# Patient Record
Sex: Male | Born: 1935
Health system: Southern US, Community
[De-identification: ages and names within clinical notes are randomized; demographics above are authoritative.]

## PROBLEM LIST (undated history)

## (undated) DIAGNOSIS — R7309 Other abnormal glucose: Secondary | ICD-10-CM

## (undated) DIAGNOSIS — I10 Essential (primary) hypertension: Secondary | ICD-10-CM

## (undated) DIAGNOSIS — E7800 Pure hypercholesterolemia, unspecified: Secondary | ICD-10-CM

## (undated) DIAGNOSIS — J449 Chronic obstructive pulmonary disease, unspecified: Secondary | ICD-10-CM

## (undated) DIAGNOSIS — I219 Acute myocardial infarction, unspecified: Secondary | ICD-10-CM

## (undated) DIAGNOSIS — I639 Cerebral infarction, unspecified: Secondary | ICD-10-CM

## (undated) DIAGNOSIS — J189 Pneumonia, unspecified organism: Secondary | ICD-10-CM

## (undated) DIAGNOSIS — I499 Cardiac arrhythmia, unspecified: Secondary | ICD-10-CM

## (undated) DIAGNOSIS — I252 Old myocardial infarction: Secondary | ICD-10-CM

## (undated) DIAGNOSIS — E669 Obesity, unspecified: Secondary | ICD-10-CM

## (undated) DIAGNOSIS — I2510 Atherosclerotic heart disease of native coronary artery without angina pectoris: Secondary | ICD-10-CM

## (undated) DIAGNOSIS — R0600 Dyspnea, unspecified: Secondary | ICD-10-CM

## (undated) HISTORY — DX: Obesity, unspecified: E66.9

## (undated) HISTORY — DX: Essential (primary) hypertension: I10

## (undated) HISTORY — DX: Atherosclerotic heart disease of native coronary artery without angina pectoris: I25.10

## (undated) HISTORY — DX: Old myocardial infarction: I25.2

## (undated) HISTORY — DX: Other abnormal glucose: R73.09

## (undated) HISTORY — DX: Pure hypercholesterolemia, unspecified: E78.00

---

## 1978-08-02 DIAGNOSIS — I252 Old myocardial infarction: Secondary | ICD-10-CM

## 1978-08-02 HISTORY — DX: Old myocardial infarction: I25.2

## 1991-08-03 HISTORY — PX: CARDIAC CATHETERIZATION: SHX172

## 1994-05-02 HISTORY — PX: CORONARY ARTERY BYPASS GRAFT: SHX141

## 2002-03-14 ENCOUNTER — Ambulatory Visit (HOSPITAL_COMMUNITY): Admission: RE | Admit: 2002-03-14 | Discharge: 2002-03-14 | Payer: Self-pay | Admitting: *Deleted

## 2002-03-14 ENCOUNTER — Encounter (INDEPENDENT_AMBULATORY_CARE_PROVIDER_SITE_OTHER): Payer: Self-pay | Admitting: Specialist

## 2002-08-02 HISTORY — PX: CORONARY STENT PLACEMENT: SHX1402

## 2003-04-06 ENCOUNTER — Inpatient Hospital Stay (HOSPITAL_COMMUNITY): Admission: EM | Admit: 2003-04-06 | Discharge: 2003-04-10 | Payer: Self-pay

## 2004-08-26 ENCOUNTER — Emergency Department (HOSPITAL_COMMUNITY): Admission: EM | Admit: 2004-08-26 | Discharge: 2004-08-26 | Payer: Self-pay | Admitting: Emergency Medicine

## 2009-01-03 ENCOUNTER — Encounter: Admission: RE | Admit: 2009-01-03 | Discharge: 2009-01-03 | Payer: Self-pay | Admitting: Internal Medicine

## 2010-04-20 ENCOUNTER — Ambulatory Visit: Payer: Self-pay | Admitting: Cardiology

## 2010-11-01 HISTORY — PX: CARDIAC CATHETERIZATION: SHX172

## 2010-11-13 ENCOUNTER — Emergency Department (HOSPITAL_COMMUNITY): Payer: Medicare Other

## 2010-11-13 ENCOUNTER — Inpatient Hospital Stay (HOSPITAL_COMMUNITY)
Admission: EM | Admit: 2010-11-13 | Discharge: 2010-11-14 | DRG: 287 | Disposition: A | Discharge status: Home / Self Care | Payer: Medicare Other | Attending: Cardiology | Admitting: Cardiology

## 2010-11-13 DIAGNOSIS — I25810 Atherosclerosis of coronary artery bypass graft(s) without angina pectoris: Secondary | ICD-10-CM | POA: Diagnosis present

## 2010-11-13 DIAGNOSIS — I10 Essential (primary) hypertension: Secondary | ICD-10-CM | POA: Diagnosis present

## 2010-11-13 DIAGNOSIS — I2510 Atherosclerotic heart disease of native coronary artery without angina pectoris: Principal | ICD-10-CM | POA: Diagnosis present

## 2010-11-13 DIAGNOSIS — I2582 Chronic total occlusion of coronary artery: Secondary | ICD-10-CM | POA: Diagnosis present

## 2010-11-13 DIAGNOSIS — Z79899 Other long term (current) drug therapy: Secondary | ICD-10-CM

## 2010-11-13 DIAGNOSIS — Z7982 Long term (current) use of aspirin: Secondary | ICD-10-CM

## 2010-11-13 DIAGNOSIS — R0789 Other chest pain: Secondary | ICD-10-CM

## 2010-11-13 DIAGNOSIS — E785 Hyperlipidemia, unspecified: Secondary | ICD-10-CM | POA: Diagnosis present

## 2010-11-13 DIAGNOSIS — Z87891 Personal history of nicotine dependence: Secondary | ICD-10-CM

## 2010-11-13 DIAGNOSIS — E669 Obesity, unspecified: Secondary | ICD-10-CM | POA: Diagnosis present

## 2010-11-13 DIAGNOSIS — Z9861 Coronary angioplasty status: Secondary | ICD-10-CM

## 2010-11-13 LAB — PROTIME-INR
INR: 0.97 (ref 0.00–1.49)
Prothrombin Time: 13.1 seconds (ref 11.6–15.2)

## 2010-11-13 LAB — BASIC METABOLIC PANEL
CO2: 30 mEq/L (ref 19–32)
Calcium: 9.2 mg/dL (ref 8.4–10.5)
GFR calc Af Amer: >60 mL/min (ref >60)
GFR calc non Af Amer: >60 mL/min (ref >60)
Sodium: 136 mEq/L (ref 135–145)

## 2010-11-13 LAB — CBC
MCHC: 35.7 g/dL (ref 30.0–36.0)
RDW: 12.7 % (ref 11.5–15.5)

## 2010-11-13 LAB — DIFFERENTIAL
Basophils Absolute: 0.1 10*3/uL (ref 0.0–0.1)
Basophils Relative: 1 % (ref 0–1)
Eosinophils Absolute: 0.1 10*3/uL (ref 0.0–0.7)
Eosinophils Relative: 1 % (ref 0–5)
Monocytes Absolute: 0.3 10*3/uL (ref 0.1–1.0)

## 2010-11-13 LAB — CK TOTAL AND CKMB (NOT AT ARMC)
CK, MB: 1.9 ng/mL (ref 0.3–4.0)
Total CK: 65 U/L (ref 7–232)

## 2010-11-14 DIAGNOSIS — R079 Chest pain, unspecified: Secondary | ICD-10-CM

## 2010-11-14 LAB — LIPID PANEL
Cholesterol: 112 mg/dL (ref 0–200)
HDL: 40 mg/dL (ref >39)
Total CHOL/HDL Ratio: 2.8 RATIO
Triglycerides: 108 mg/dL (ref <150)

## 2010-11-14 LAB — CBC
HCT: 40.6 % (ref 39.0–52.0)
MCV: 90.2 fL (ref 78.0–100.0)
Platelets: 209 10*3/uL (ref 150–400)
RBC: 4.5 MIL/uL (ref 4.22–5.81)
WBC: 6 10*3/uL (ref 4.0–10.5)

## 2010-11-14 LAB — BASIC METABOLIC PANEL
BUN: 11 mg/dL (ref 6–23)
Chloride: 107 mEq/L (ref 96–112)
Glucose, Bld: 114 mg/dL — ABNORMAL HIGH (ref 70–99)
Potassium: 3.6 mEq/L (ref 3.5–5.1)
Sodium: 140 mEq/L (ref 135–145)

## 2010-11-14 LAB — CARDIAC PANEL(CRET KIN+CKTOT+MB+TROPI): Relative Index: INVALID (ref 0.0–2.5)

## 2010-11-19 ENCOUNTER — Encounter: Payer: Self-pay | Admitting: *Deleted

## 2010-11-19 DIAGNOSIS — I259 Chronic ischemic heart disease, unspecified: Secondary | ICD-10-CM

## 2010-11-19 DIAGNOSIS — E7800 Pure hypercholesterolemia, unspecified: Secondary | ICD-10-CM | POA: Insufficient documentation

## 2010-11-19 DIAGNOSIS — I2510 Atherosclerotic heart disease of native coronary artery without angina pectoris: Secondary | ICD-10-CM | POA: Insufficient documentation

## 2010-11-19 DIAGNOSIS — I10 Essential (primary) hypertension: Secondary | ICD-10-CM | POA: Insufficient documentation

## 2010-11-19 DIAGNOSIS — E669 Obesity, unspecified: Secondary | ICD-10-CM

## 2010-11-19 DIAGNOSIS — R7309 Other abnormal glucose: Secondary | ICD-10-CM | POA: Insufficient documentation

## 2010-11-19 NOTE — Discharge Summary (Signed)
  NAME:  BLANCHE, GALLIEN NO.:  [id number]  MEDICAL RECORD NO.:  [id number]           PATIENT TYPE:  I  LOCATION:  3709                         FACILITY:  [facility]  PHYSICIAN:  Noralyn Pick. Eden Emms, MD, FACCDATE OF BIRTH:  April 03, 1936  DATE OF ADMISSION:  11/13/2010 DATE OF DISCHARGE:  11/14/2010                              DISCHARGE SUMMARY   PROCEDURES: 1. Cardiac catheterization. 2. Coronary arteriogram. 3. Left ventriculogram. 4. Left internal mammary artery arteriogram. 5. Graft angiogram.  PRIMARY FINAL DISCHARGE DIAGNOSIS:  Chest pain, medical therapy for coronary artery disease recommended.  SECONDARY DIAGNOSES: 1. Status post aortocoronary bypass surgery in 1995 with left internal     mammary artery to left anterior descending artery, saphenous vein     graft to circumflex, saphenous vein graft to right coronary artery. 2. Status post cardiac catheterization last in 2004 with a 2.5 x 13     Cypher stent to the left main, saphenous vein graft to right     coronary artery totalled. 3. Hypertension. 4. Dyslipidemia. 5. Obesity. 6. Status post cholecystectomy. 7. History of hyperglycemia. 8. History of tobacco use, quit in 1995.  LABORATORY VALUES:  Fasting blood sugar 114, nonfasting blood sugar 164. Total cholesterol 112, triglycerides 108, HDL 40, LDL 50.  Chest x-ray; mild hyperinflation, no acute disease.  HOSPITAL COURSE:  Mr. Larry Briggs is a 75 year old male with known coronary artery disease.  He had chest pain that reminded him of his prebypass symptoms and came to the hospital where he was admitted for further evaluation.  His cardiac enzymes were negative for MI.  Cardiac catheterization was performed on November 13, 2010 and showed patent left main stent with less than 40% in-stent restenosis, LIMA to LAD patent and SVG to OM patent with no critical distal disease, SVG to RCA and RCA both totalled, EF 50% with inferior hypokinesis.  On November 14, 2010, Mr. Riddle was ambulating without chest pain or shortness of breath.  A lipid profile and chest x-ray were described above.  His cath site was without bruit or hematoma.  He is considered stable for discharge, to follow up as an outpatient.  DISCHARGE INSTRUCTIONS: 1. His activity level is to be increased gradually. 2. He is to call our office for problems with the cath site. 3. He is encouraged to stick to a low-sodium, low-carbohydrate diet     and follow up with primary care. 4. He is to follow up with Dr. Deborah Chalk and our office will call.  DISCHARGE MEDICATIONS: 1. Doxazosin 4 mg a day. 2. Lisinopril 5 mg a day. 3. Fish oil 1000 mg daily. 4. Toprol-XL 50 mg a day. 5. Simvastatin 20 mg a day. 6. Multivitamin daily. 7. Sublingual nitroglycerin p.r.n. 8. Aspirin 325 mg a day.     Theodore Demark, PA-C   ______________________________ Noralyn Pick. Eden Emms, MD, [facility]    RB/MEDQ  D:  11/14/2010  T:  11/14/2010  Job:  [id number]  cc:   [facility]  Electronically Signed by Theodore Demark PA-C on 11/17/2010 01:32:48 PM Electronically Signed by Charlton Haws MD [facility] on 11/19/2010 11:31:36 AM

## 2010-11-20 ENCOUNTER — Ambulatory Visit (INDEPENDENT_AMBULATORY_CARE_PROVIDER_SITE_OTHER): Payer: Medicare Other | Admitting: Cardiology

## 2010-11-20 ENCOUNTER — Encounter: Payer: Self-pay | Admitting: Cardiology

## 2010-11-20 VITALS — Ht 70.0 in | Wt 200.0 lb

## 2010-11-20 DIAGNOSIS — I2510 Atherosclerotic heart disease of native coronary artery without angina pectoris: Secondary | ICD-10-CM

## 2010-11-20 NOTE — Progress Notes (Signed)
Subjective:   Larry Briggs comes in today for followup visit. We discussed the fact that I did not original catheterization on him in 1983. He a recent catheterization the hospital which confirmed one occluded distal right coronary artery that filled by natural collaterals and otherwise he has satisfactory perfusion. His ejection fraction was well preserved. I encouraged him to continue his exercise and modification of cardiovascular risk factors. He does have mild obesity as well as hypertension and dyslipidemia. Overall, he's feeling better without recurrent chest discomfort. His groin is satisfactory from his catheterization.  Current Outpatient Prescriptions  Medication Sig Dispense Refill  . Ascorbic Acid (VITAMIN C) 500 MG CAPS Take 1 capsule by mouth daily. ( Estra-C )       . aspirin 325 MG EC tablet Take 325 mg by mouth daily.        Marland Kitchen doxazosin (CARDURA) 4 MG tablet Take 4 mg by mouth at bedtime.        . fish oil-omega-3 fatty acids 1000 MG capsule Take 1 g by mouth 2 (two) times daily.        Marland Kitchen lisinopril (PRINIVIL,ZESTRIL) 5 MG tablet Take 5 mg by mouth daily.        . metoprolol (TOPROL-XL) 50 MG 24 hr tablet Take 50 mg by mouth daily.        . multivitamin (THERAGRAN) per tablet Take 1 tablet by mouth daily.        . simvastatin (ZOCOR) 20 MG tablet Take 20 mg by mouth at bedtime.        Marland Kitchen NITROSTAT 0.4 MG SL tablet Take 1 tablet by mouth as needed.        Allergies  Allergen Reactions  . Iodinated Diagnostic Agents     Patient Active Problem List  Diagnoses  . Hypertension  . Arteriosclerotic cardiovascular disease (ASCVD)  . Hypercholesteremia  . Obesity  . Ischemic heart disease  . Increased glucose level    History  Smoking status  . Former Smoker -- 1.0 packs/day for 40 years  . Types: Cigarettes  . Quit date: 07/02/1994  Smokeless tobacco  . Not on file    History  Alcohol Use No    Family History  Problem Relation Age of Onset  . Heart attack Father   .  Hypertension Father   . Stroke Mother     Review of Systems:   The patient denies any heat or cold intolerance.  No weight gain or weight loss.  The patient denies headaches or blurry vision.  There is no cough or sputum production.  The patient denies dizziness.  There is no hematuria or hematochezia.  The patient denies any muscle aches or arthritis.  The patient denies any rash.  The patient denies frequent falling or instability.  There is no history of depression or anxiety.  All other systems were reviewed and are negative.   Physical Exam:   Weight is 200. Blood pressure 126/84 sitting, heart rate 62. He has moderate truncal obesity.The head is normocephalic and atraumatic.  Pupils are equally round and reactive to light.  Sclerae nonicteric.  Conjunctiva is clear.  Oropharynx is unremarkable.  There's adequate oral airway.  Neck is supple there are no masses.  Thyroid is not enlarged.  There is no lymphadenopathy.  Lungs are clear.  Chest is symmetric.  Heart shows a regular rate and rhythm.  S1 and S2 are normal.  There is no murmur click or gallop.  Abdomen is soft normal bowel sounds.  There is no organomegaly.  Genital and rectal deferred.  Extremities are without edema.  Peripheral pulses are adequate.  Neurologically intact.  Full range of motion.  The patient is not depressed.  Skin is warm and dry. Assessment / Plan:

## 2010-11-20 NOTE — Assessment & Plan Note (Signed)
We will continue his current medical management. His catheterization was done on November 13, 2010 which showed only mild reduction in left ventricular function with an inferior basilar wall motion abnormality in a global ejection fraction of 50%. He had a widely patent internal mammary artery to the diagonal LAD with collateralization of the distal right coronary artery. He had a patent saphenous vein graft to the obtuse marginal. He had an occluded saphenous vein graft to the distal right coronary artery with retrograde collaterals is noted. He had continued patency of the stent to the left main coronary artery and LAD. Overall, he continues to do well. He would like to see Dr. [place] in followup and I'll have him seen in approximately 6-8 months.

## 2010-11-20 NOTE — H&P (Signed)
NAME:  Larry Briggs, Larry Briggs NO.:  [id number]  MEDICAL RECORD NO.:  [id number]           PATIENT TYPE:  I  LOCATION:  3709                         FACILITY:  [facility]  PHYSICIAN:  Luis Abed, MD, FACCDATE OF BIRTH:  [birth year]  DATE OF ADMISSION:  11/13/2010 DATE OF DISCHARGE:                             HISTORY & PHYSICAL   PRIMARY CARDIOLOGIST:  Colleen Can. Deborah Chalk, MD  CHIEF COMPLAINT:  Chest discomfort.  HISTORY OF PRESENT ILLNESS:  Larry Briggs is a 75 year old Caucasian gentleman with a known history of coronary artery disease, having had CABG in 1995 with last cath in 2004 revealing 90% left main disease resulting in a 2.5 x 13-mm Cypher stent being placed as well as the discovery of 100% occlusion in the RCA and 100% occlusion of SVG to RCA but with collaterals from LAD, which had a patent LIMA to that vessel and patent SVG to the circumflex with preserved LV systolic function.The patient also has history significant for hypertension, dyslipidemia, obesity and S/P cholecystectomy who now presents with chest discomfort/chest flushing in the setting of generally feeling unwell, first noted yesterday and again today.  The patient was in his usual state of health until yesterday when the patient noticed chest discomfort which he has had from time to time since his cath in 2004.  This is nonexertional and he paid no attention to it.  Upon detailed inquiry, the patient describes a very localized area in the left chest where he gets some slight discomfort which is alleviated by palpation, but has no clear aggravating factors and can last for several hours at a low level of intensity.  The patient was not especially concerned about these symptoms, but this morning he awoke early, and shortly thereafter had a sensation of flushing in his chest. This was alarming to the patient because he has had this same symptom prior to his CABG and cath in 2004.  He was also  generally not feeling well and when he describes his clinical picture to his wife, she insisted they come to the emergency department for further evaluation. The patient also notes that there were planning a trip and he did not feel comfortable leaving town without being evaluated first.  The patient has had one set of negative cardiac enzymes and his EKG shows no concerning changes.  Vital signs only significant for mild hypertension with peak systolic at 162.  Chest x-ray benign.  CBC and BMET unremarkable except for glucose of 164.  Upon further interrogation, the patient describes approximately 1 minute of chest warmness that is virtually identical to symptoms he was experiencing prior to his CABG and cath in 1004.  However, today he only had one episode and in the past, he would have multiple episodes but again lasting only approximately 1 minute.  The patient continues to feel generally unwell, but not in a serious distress and has had no recurrence of his chest discomfort described above since this morning.  The patient is unsure but thinks he had a stress test that was negative in 2010 (pharmacologic).  Note from September 2011 by Dr. Deborah Chalk, does not  mention any stress test except for a stress Cardiolite completed in March 2000.  PAST MEDICAL HISTORY: 1. CAD.     a.     CABG, 1995:  SVG - RCA, SVG - circumflex artery, LIMA to      LAD.     b.     Cardiac catheterization 2004 revealing severe LM disease,      90% (S/P 2.5 x 13 mm Cypher DES), 100% occlusion of RCA, 100%      occlusion of SVG - RCA with collaterals from LAD, LIMA - LAD      patent, SVG to circumflex patent, preserved LV systolic function. 2. Hypertension. 3. Dyslipidemia. 4. Obesity. 5. S/P cholecystectomy.  SOCIAL HISTORY:  The patient lives in [place] with his wife.  He is retired but is very active working around his house and does still exercise on a treadmill occasionally without exertional  symptoms.  He follows a regular diet only.  No tobacco abuse for decades, no significant EtOH, no illicit drug use.  FAMILY HISTORY:  Noncontributory secondary to the patient's advanced age.  REVIEW OF SYSTEMS:  Please see HPI.  All other systems were reviewed and were negative.  CODE STATUS:  Full.  ALLERGIES:  NKDA.  MEDICATIONS: 1. Lisinopril 5 mg p.o. daily. 2. Doxazosin 4 mg p.o. daily. 3. Toprol 50 mg p.o. daily. 4. Simvastatin 20 mg p.o. nightly. 5. Enteric-coated aspirin 325 mg p.o. daily.  PHYSICAL EXAMINATION:  VITAL SIGNS:  Temperature 98.0 degrees Fahrenheit, BP 138-160/58-79, pulse 60-74 with respirations of 12-16, O2 saturation 97% on room air. GENERAL:  The patient is alert and oriented x3, in no apparent distress, able to speak easily in full sentences without respiratory distress. HEAD:  Normocephalic, atraumatic.  Pupils equal, round, and reactive to light.  Extraocular muscles are intact.  Nares are patent without discharge.  Oropharynx without erythema or exudates. NECK:  Supple without lymphadenopathy.  No thyromegaly.  No JVD. HEART:  Heart rate is regular with audible S1, S2.  No clicks, rubs, murmurs or gallops and pulses are 2+ and equal in both upper and lower extremities bilaterally. LUNGS:  Clear to auscultation bilaterally. SKIN:  No rashes, lesions, or petechiae. ABDOMEN:  Soft, nontender, nondistended.  Normal abdominal bowel sounds. No rebound or guarding.  No hepatosplenomegaly.  The patient is obese. EXTREMITIES:  Without clubbing, cyanosis, or edema. MUSCULOSKELETAL:  Without joint deformity, effusions or spinal nor CVA tenderness. NEUROLOGIC;  Cranial nerves II through XII grossly intact.  Strength 5/5 in all extremities and axial groups. Normal sensation throughout and normal cerebellar function.  RADIOLOGY:  Chest x-ray showed mild hyperinflation with no active disease.  EKG:  Rhythm, NSR, 75 bpm, nonspecific ST-T wave changes,  Q-wave in V1, normal axis, no evidence of hypertrophy.  PR 180, QRS 90, and QTc of 438.  LABORATORY DATA:  WBC is 6.3, HGB 16.4, HCT 45.9, PLT count is 202. Sodium 136, potassium 4.6, chloride 100, bicarb 30, BUN 11, creatinine 0.95, glucose 164.  First full set of enzymes negative with CK 65, MB 1.9, and troponin 0.01.  ASSESSMENT AND PLAN:  The patient initially was seen by Jarrett Ables, PA-C and then seen and thoroughly assessed by attending cardiologist, Dr. Willa Rough.  Mr. Satz is a 75 year old Caucasian gentleman with the above-noted complex medical history including coronary artery disease with prior coronary artery bypass graft and last cath in 2004 resulting in Cypher stent to 90% left main disease as well as finding of 100% right  coronary artery and vein graft to the right coronary artery occlusion with collaterals from left anterior descending with patent left internal mammary artery to that vessel and patent saphenous vein graft to circumflex artery, otherwise history significant for hypertension, dyslipidemia, obesity who now presents with his anginal equivalent without any objective evidence of a cardiac etiology to his symptoms.  Given the patient's high pretest probability and the similarity in his symptoms today to symptoms he has had prior to his other events, would favor diagnostic cardiac catheterization for definitive answer.  Risks and benefits of cardiac catheterization have been discussed in detail with the patient and his wife, and they agree to proceed with cath.  We will make every attempt to have this procedure completed today as today is Friday and we will be unable to do elective catheterizations over the weekend.  Otherwise, we will continue home medications and further planning, pending results of diagnostic cath.     Jarrett Ables, PAC   ______________________________ Luis Abed, MD, [facility]    MS/MEDQ  D:  11/13/2010  T:   11/14/2010  Job:  [id number]  Electronically Signed by Jarrett Ables PAC on 11/16/2010 04:07:06 PM Electronically Signed by Willa Rough MD F[facility] on 11/20/2010 01:07:16 PM

## 2010-12-18 NOTE — Discharge Summary (Signed)
NAME:  Larry Briggs, Larry Briggs NO.:  [id number]   MEDICAL RECORD NO.:  [id number]                   PATIENT TYPE:  INP   LOCATION:  6527                                 FACILITY:  [facility]   PHYSICIAN:  Colleen Can. Deborah Chalk, M.D.            DATE OF BIRTH:  [birth year]   DATE OF ADMISSION:  04/06/2003  DATE OF DISCHARGE:  04/10/2003                                 DISCHARGE SUMMARY   PRIMARY DISCHARGE DIAGNOSIS:  Chest pain with subsequent cardiac  catheterization with elective percutaneous coronary intervention to the left  main coronary artery.   SECONDARY DIAGNOSES:  1. Known atherosclerotic cardiovascular disease with previous coronary     artery bypass grafting dating back to 1995.  2. Hyperlipidemia.  3. Hypertension.  4. History of hyperglycemia.   HISTORY OF PRESENT ILLNESS:  The patient is a 75 year old white male who  presents to the hospital with a two to three day history of chest  discomfort. He thought it was initially attributed to pulling fence post,  and his pain was initially relieved with Tylenol; however, on the day of  admission, he had associated nausea with increased intensity of the  discomfort. It lasted for approximately 20 minutes. This discomfort was not  like his previous chest pain syndrome. He had no other associated symptoms.  He was subsequently seen and evaluated by Dr. Candace Cruise and was referred  for cardiac catheterization.   Please see the dictated history and physical for further patient  presentation and profile.   LABORATORY DATA:  On admission, cardiac enzymes were negative. Hemoglobin  A1c was 5.4. CBC was basically within normal limits.   EKG showed T wave changes.   HOSPITAL COURSE:  The patient was admitted initially by Dr. Candace Cruise. He  ruled out negative for myocardial infarction. He was managed medically over  the course of the weekend with no further episodes of chest pain. We  proceeded on with  cardiac catheterization on April 09, 2003. That  procedure was tolerated well without any noted complications. The vein graft  to the right coronary artery/PDA was 100% occluded; right coronary artery  was 100% occluded proximally. The left main has a 90% distal narrowing. The  LAD has diffuse 90% narrowing with very satisfactory left internal mammary  to the LAD with collaterals to the right coronary. The left circumflex is  100% occluded with the vein graft being satisfactory. Subsequent angioplasty  with stent placement with a 2.5 x 13 mm Cypher stent was placed to the left  main with an overall satisfactory result obtained.   By April 10, 2003, he was doing well without complaints. Overall,  physical exam was unremarkable, and he was felt to be a stable candidate for  discharge. His potassium was slightly low on the day of discharge and was  replaced appropriately.   DISCHARGE CONDITION:  Stable.   DISCHARGE MEDICATIONS:  He will resume his  aspirin, Cardura, and Lipitor as  he was taking before. We will add Plavix 75 mg a day for the next six  months, Toprol-XL 50 mg a day, and nitroglycerin p.r.n.   ACTIVITY:  His activity is to be light for one week.   DIET:  No concentrated sweets.   FOLLOW UP:  He is to place an ice pack if needed to the ground, and  otherwise, we will plan on seeing him back in approximately two weeks. He is  to call the office to schedule that appointment.      Juanell Fairly C. Earl Gala, N.P.                 Colleen Can. Deborah Chalk, M.D.    LCO/MEDQ  D:  04/10/2003  T:  04/10/2003  Job:  [id number]   cc:   [facility]   Elvina Sidle, M.D.  [address] [place]  [place] [address]  Fax: [phone]

## 2010-12-18 NOTE — Cardiovascular Report (Signed)
NAME:  Larry Briggs, Larry Briggs NO.:  [id number]   MEDICAL RECORD NO.:  [id number]                   PATIENT TYPE:  INP   LOCATION:  6527                                 FACILITY:  [facility]   PHYSICIAN:  Colleen Can. Deborah Chalk, M.D.            DATE OF BIRTH:  07-Apr-1936   DATE OF PROCEDURE:  04/09/2003  DATE OF DISCHARGE:                              CARDIAC CATHETERIZATION   PROCEDURE:  Left heart catheterization with selective coronary angiography,  left ventricular angiography with saphenous vein graft angiography x2,  angiography left internal mammary artery, and stent placement in the distal  left main coronary artery.   TYPE AND SITE OF ENTRY:  Percutaneous, right femoral artery.   CATHETERS:  6-French 4 curved Judkins right and left coronary catheters; 6-  French pigtail ventriculographic catheter, initially a 6-French guide  subsequently changed to a 7-French guide, a 2.5 x 15 mm CrossSail balloon  and subsequently a 2.5 x 13 mm Cypher stent.   CONTRAST MATERIAL:  Omnipaque.   MEDICATIONS GIVEN PRIOR TO THE PROCEDURE:  Valium 10 mg p.o.   MEDICATIONS GIVEN DURING THE PROCEDURE:  Versed 4 mg IV.   COMMENTS:  The patient tolerated the procedure well.  He was given  Integrilin, IV nitroglycerin, and heparin with his angioplasty and stent  placement.   HEMODYNAMIC DATA:  1. The aortic pressure was 133/69.  2. LV was 161/9-14.  3. There was no aortic valve gradient noted on pullback.   ANGIOGRAPHIC DATA:  1. Left main coronary artery had a 90% distal narrowing.  2. Left circumflex was totally occluded.  3. Left anterior descending had three large proximal septal perforating     branches.  There was a 90% segmental stenosis and then the diagonal     vessel as well as the continuation left anterior descending were supplied     by the left internal mammary graft.  4. Right coronary artery is totally occluded proximally.  5. Saphenous vein graft to the  right coronary artery is widely patent with a     nice insertion and good distal runoff.  6. Saphenous vein graft to the right coronary artery is totally occluded.  7. Left internal mammary artery to the LAD and diagonal are widely patent.     There is continuation with excellent flow into the posterior descending     vessel.  The posterior descending vessel has somewhat diffuse disease but     there is excellent flow.   LEFT VENTRICULAR ANGIOGRAM:  Left ventricular angiogram was performed in the  RAO position.  Overall cardiac size and silhouette are normal.  There is  very mild inferior basilar hypokinesis.  There is no intracavitary filling  defects or intracardiac calcification.   ANGIOPLASTY PROCEDURE:  A JL4 guide provided only fair back-up, especially  with the 6-French system which subsequently changed to a 7-French system.  2.5 x 15 mm CrossSail balloon was  inflated to a maximum of 18 atmospheres.  There was localized dissection returned with the 2.5 x 13 mm Cypher stent.  This was inflated to a maximum of 16 atmospheres.  Final angiographic result  was felt to be satisfactory with good distal flow and flow into the septal  perforating branches.  There was a reasonably smooth transition from the  left main coronary artery to the left anterior descending and we did have  persistent patency of the septal perforating branches as well as the  proximal circumflex branches.   OVERALL IMPRESSION:  1. Well preserved global left ventricular function with mild inferior     hypokinesis.  2. Severe left main stenosis with successful stent placement on this     catheterization laboratory visit.  3. Totally occluded right coronary artery and saphenous vein graft to the     right coronary artery with collaterals from the bypassed left anterior     descending.  4. Persistent patency of the bypass graft to the left circumflex as well as     persistent patency of the left internal mammary  artery graft to the     diagonal and left anterior descending.                                               Colleen Can. Deborah Chalk, M.D.    SNT/MEDQ  D:  04/09/2003  T:  04/10/2003  Job:  [id number]

## 2010-12-18 NOTE — H&P (Signed)
NAME:  Larry Briggs, Larry Briggs NO.:  [id number]   MEDICAL RECORD NO.:  [id number]                   PATIENT TYPE:  EMS   LOCATION:  [facility]                                 FACILITY:  [facility]   PHYSICIAN:  Meade Maw, M.D.                 DATE OF BIRTH:  19-Feb-1936   DATE OF ADMISSION:  04/06/2003  DATE OF DISCHARGE:                                HISTORY & PHYSICAL   INDICATION FOR ADMISSION:  Unstable angina.   HISTORY OF PRESENT ILLNESS:  Larry Briggs is a pleasant 75 year old  gentleman with known coronary artery disease status post CABG in 1995. These  records are not available for review, who presents to the emergency room  with complaints of discomfort in his chest for approximately 2-3 days. He  initially attributed this to pulling fence posts and felt the pain was  relieved with Tylenol. However, today he felt nauseated with increased  intensity of the discomfort. He therefore subsequently presented to the  emergency room. The discomfort did not interfere with his activity and  persisted for approximately 20 minutes. His anginal presentation prior to  his CABG was hot flashes/pressure. This discomfort is not like his previous  presenting symptoms. He denies palpitations, pre-syncope or syncope. His  last evaluation by Dr. Deborah Chalk was years ago.   PAST MEDICAL HISTORY:  1. Coronary artery disease.  2. Dyslipidemia.  3. Hypertension.  4. Hyperglycemia.   ALLERGIES:  No known drug allergies.   CURRENT MEDICATIONS:  1. Aspirin 81 mg daily.  2. Lipitor 20 mg daily.  3. Cardura, dose is unknown.   PAST SURGICAL HISTORY:  1. Coronary artery bypass surgery in 1995.  2. Cholecystectomy in 1982.   SOCIAL HISTORY:  No history of tobacco use since 1995. No history of alcohol  use. He is married. He is retired from the [employer].   REVIEW OF SYSTEMS:  His activity is limited by dyspnea and he has had  significant weight gain, resulting  in glucose intolerance. He has had no  change in bowel or bladder habits. He has had no diaphoresis. Review of  systems is otherwise negative.   FAMILY HISTORY:  Both parents are dead. Died of natural causes.   PHYSICAL EXAMINATION:  VITAL SIGNS: Blood pressure 166/80. Heart rate 98.  Respiratory rate 22. O2 saturation is 98% on room air.  HEENT: Unremarkable. He has good carotid upstrokes and no carotid bruit.  Thyroid is not palpable.  PULMONARY: Examination reveals breath sounds which are equal and clear to  auscultation. No use of accessory muscles.  CARDIOVASCULAR: Examination reveals a regular rate and rhythm. Normal S1 and  normal S2. No rubs, murmurs, or gallops noted.  ABDOMEN: Obese, nontender. No unusual bruits or pulsations. Examination is  made difficult with body habitus.  EXTREMITIES: Reveal distal pulses which are equal and palpable. There is no  peripheral edema noted.   LABORATORY  DATA:  His troponin I is less than 0.05. His myoglobin is  negative. His MB fraction is negative. His potassium is 4.3. His BUN is 11.  His glucose is 157. He has normal liver enzymes.   ECG reveals a sinus tachycardia with non-specific ST changes.   Chest x-ray is normal.   IMPRESSION:  1. Chest pain in a 75 year old gentleman with known coronary artery disease.     The chest pressure has typical atypical features. Therefore, we will     proceed with an Adenosine Cardiolite for further evaluation if his     cardiac enzymes remain negative. Will continue with nitrates, aspirin,     and Lovenox.  2. Hypertension, poorly controlled. Will add Toprol 50 mg daily.  3. Obesity, deconditioned. Cardiac rehabilitation was discussed.  4. Hyperglycemia. Will check his hemoglobin A1C.                                                Meade Maw, M.D.    HP/MEDQ  D:  04/06/2003  T:  04/06/2003  Job:  [id number]   cc:   Colleen Can. Deborah Chalk, M.D.  Fax: [phone]   [facility]

## 2010-12-31 NOTE — Cardiovascular Report (Signed)
NAME:  Larry Briggs, Larry Briggs NO.:  [id number]  MEDICAL RECORD NO.:  [id number]           PATIENT TYPE:  I  LOCATION:  3709                         FACILITY:  [facility]  PHYSICIAN:  Larry Morton. 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  He underwent stenting of the left main in 2004. At that time, he was noted to have occlusion of the saphenous vein graft to the distal right coronary artery, with collaterals from the bypass to the LAD and a patent bypass to the left circumflex.  He also has hyperlipidemia, obesity, hypertension, remote tobacco abuse, and glucose intolerance.  He has had prior cholecystectomy.  He had some episodes of flushing and was seen by Larry Briggs in the emergency room.  There was concern about return of his symptoms.  His hemoglobin was 16.4, hematocrit 45.9.  White count was normal.  Prothrombin time was intact. Potassium was 4.6, glucose 164, and BUN and creatinine normal. Creatinine clearance was in excess of 60.  His initial set of enzymes were negative.  Larry Briggs felt that cardiac catheterization was indicated.  He was brought for further evaluation.  PROCEDURE: 1. Left heart catheterization. 2. Selective coronary arteriography. 3. Selective left ventriculography. 4. Saphenous vein graft angiography. 5. Selective left internal mammary angiography.  DESCRIPTION OF PROCEDURE:  The procedure was performed from the right femoral artery using 5-French catheters.  There were no complications. He tolerated the procedure well.  He was taken to the holding area in satisfactory clinical condition.  I reviewed the films, compared them to the old films, discussed the case with the patient and subsequently his wife and reviewed the films with his wife as well.   There were no major complications.  HEMODYNAMIC DATA: 1. The central aortic pressure was 161/76, mean 111. 2. Left ventricular pressure 170/11. 3. There was no gradient or pullback across aortic valve.  ANGIOGRAPHIC DATA: 1. The left main actually is patent.  The circumflex ostium is not     seen.  The stent previously placed at the left main LAD junction     overlapping the takeoff of the diagonal is patent.  There is flow     into the distal LAD through the septal perforators.  There is     excellent flow noted retrograde and antegrade through the left     internal mammary to diagonal and LAD.  Retrograde filling of this     graft is noted. 2. The internal mammary to the diagonal and LAD is widely patent with     excellent flow to the distal vessels.  Collateralization of the     distal right coronary circulation is seen. 3. The circumflex is noted as occluded. 4. This right coronary artery is noted as occluded. 5. The saphenous vein graft to the large obtuse marginal appears     intact and widely patent with smooth margins. 6. The saphenous vein graft to the distal right circulation is  occluded and the distal right circulation is supplied as noted. 7. The ventriculogram demonstrates ejection fraction in the range of     50% with an inferobasal wall motion abnormality.  This is largely     unchanged.  CONCLUSION: 1. Mild reduction in left ventricular function with an inferobasal     wall motion abnormality. 2. Widely patent internal mammary to the diagonal LAD with     collateralization of the distal right. 3. Patent saphenous vein graft to the obtuse marginal. 4. Occluded saphenous vein graft to the distal right with retrograde     collaterals as noted. 5. Continued patency of the stent to the LAD left main interface     previously done by Larry Briggs in 2004.  DISPOSITION: 1. From the cardiac standpoint, he will be treated medically. 2. Flushing episodes are  unclear.  We will obtain a couple of studies     over the night to make sure that he has not bumped his enzymes.     Early follow up with Larry Briggs will be encouraged.  Better     control of blood pressure may also be important.     Larry Morton. Riley Kill, MD, Larry Briggs     TDS/MEDQ  D:  11/13/2010  T:  11/14/2010  Job:  [id number]  cc:   Larry Abed, MD, Larry Briggs Larry Briggs, M.D. [facility]  Electronically Signed by Larry Pons MD [facility] on 12/31/2010 09:12:27 AM

## 2011-05-10 ENCOUNTER — Encounter: Payer: Self-pay | Admitting: Nurse Practitioner

## 2011-05-10 ENCOUNTER — Ambulatory Visit (INDEPENDENT_AMBULATORY_CARE_PROVIDER_SITE_OTHER): Payer: Medicare Other | Admitting: Nurse Practitioner

## 2011-05-10 DIAGNOSIS — I2510 Atherosclerotic heart disease of native coronary artery without angina pectoris: Secondary | ICD-10-CM

## 2011-05-10 DIAGNOSIS — I10 Essential (primary) hypertension: Secondary | ICD-10-CM

## 2011-05-10 DIAGNOSIS — R9431 Abnormal electrocardiogram [ECG] [EKG]: Secondary | ICD-10-CM

## 2011-05-10 MED ORDER — LISINOPRIL 5 MG PO TABS: 10.0000 mg | ORAL_TABLET | Freq: Every day | ORAL | Status: DC

## 2011-05-10 NOTE — Patient Instructions (Signed)
We are going to increase your Lisinopril to 10 mg daily. Take two of your 5 mg tabs to equal this dose.  I will get your records to review. Keep a check of your blood pressure at home. I will see you in 3 weeks.  Stay away from the caffeine.

## 2011-05-10 NOTE — Assessment & Plan Note (Signed)
Blood pressure is up at home. I have increased his Lisinopril to 10 mg daily. I will see him again in 3 weeks. Check BMET on return visit. He is to continue to monitor his blood pressure at home. Patient is agreeable to this plan and will call if any problems develop in the interim.

## 2011-05-10 NOTE — Assessment & Plan Note (Addendum)
Sounds like he had some skips. Probably due to his increase in caffeine. I explained that his beta blocker prevents his heart rate in going up. We will see what his records show. He is currently asymptomatic from a cardiac standpoint.    Records received after the visit. Concern was for a junctional rhythm on EKG. Tracing is reviewed with Dr. Swaziland. Felt to be sinus bradycardia. He remains asymptomatic. We will continue with our plan as designated.

## 2011-05-10 NOTE — Assessment & Plan Note (Signed)
He has had repeat cath back in April. We will continue with his current management. He is asymptomatic.

## 2011-05-10 NOTE — Progress Notes (Signed)
[name] Date of Birth: May 17, 1936   History of Present Illness: Mr. Kelnhofer is seen today for follow up. He is seen for Dr. Swaziland. He is a former patient of Dr. Ronnald Nian. He is here at the request of Dr. Renne Crigler. He has been feeling well. He did have recent cath earlier this year. Stent was patent. He has one known occluded SVG. He has done well without chest pain. He continues to exercise. He notes that he was using more caffeine to get his heart rate up. He did have his physical last Thursday and apparently had some abnormality on his EKG. Sounds like he was having some skips. He has since stopped the caffeine. He does note that his blood pressure is up at home. He is exercising regularly. No syncope, dizziness or lightheadedness.   Current Outpatient Prescriptions on File Prior to Visit  Medication Sig Dispense Refill  . Ascorbic Acid (VITAMIN C) 500 MG CAPS Take 1 capsule by mouth daily. ( Estra-C )       . aspirin 325 MG EC tablet Take 325 mg by mouth daily.        Marland Kitchen doxazosin (CARDURA) 4 MG tablet Take 4 mg by mouth at bedtime.        . fish oil-omega-3 fatty acids 1000 MG capsule Take 1 g by mouth 2 (two) times daily.        Marland Kitchen lisinopril (PRINIVIL,ZESTRIL) 5 MG tablet Take 5 mg by mouth daily.        . metoprolol (TOPROL-XL) 50 MG 24 hr tablet Take 50 mg by mouth daily.        . multivitamin (THERAGRAN) per tablet Take 1 tablet by mouth daily.        Marland Kitchen NITROSTAT 0.4 MG SL tablet Take 1 tablet by mouth as needed.      . simvastatin (ZOCOR) 20 MG tablet Take 20 mg by mouth at bedtime.          Allergies  Allergen Reactions  . Iodinated Diagnostic Agents     Past Medical History  Diagnosis Date  . Hypertension   . CAD (coronary artery disease)     Remote MI in 1980. CABG 1995. Stent to left main in 2004. Last cath in April of 2012. EF 50%; patent LIMA to DX/LAD with collateralization of the distal right, patent SVG to OM, occluded SVG to distal RCA, and patent stent to  LAD  . History of heart attack 1980  . Hypercholesteremia   . Obesity   . Increased glucose level     Past Surgical History  Procedure Date  . Cardiac catheterization 1993  . Coronary artery bypass graft 05/1994    x5  . Coronary stent placement 2004    Stent to the L main  . Cardiac catheterization April 2012    Mild reduction if EF at 50%. Patent LIMA to DX/LAD with collateralization to distal RCA, patent SVG to OM and occluded SVG to distal right and patent stent to LAD/Left main    History  Smoking status  . Former Smoker -- 1.0 packs/day for 40 years  . Types: Cigarettes  . Quit date: 07/02/1994  Smokeless tobacco  . Not on file    History  Alcohol Use No    Family History  Problem Relation Age of Onset  . Heart attack Father   . Hypertension Father   . Stroke Mother     Review of Systems: The review of systems is per  the HPI.  All other systems were reviewed and are negative.  Physical Exam: BP 138/78  Pulse 76  Ht 5\' 11"  (1.803 m)  Wt 198 lb 1.9 oz (89.867 kg)  BMI 27.63 kg/m2 Patient is very pleasant and in no acute distress. Skin is warm and dry. Color is normal.  HEENT is unremarkable. Normocephalic/atraumatic. PERRL. Sclera are nonicteric. Neck is supple. No masses. No JVD. Lungs are clear. Cardiac exam shows a regular rate and rhythm. Abdomen is soft. Extremities are without edema. Gait and ROM are intact. No gross neurologic deficits noted.   LABORATORY DATA: Pending records review.   Assessment / Plan:

## 2011-06-04 ENCOUNTER — Ambulatory Visit (INDEPENDENT_AMBULATORY_CARE_PROVIDER_SITE_OTHER): Payer: Medicare Other | Admitting: Nurse Practitioner

## 2011-06-04 ENCOUNTER — Ambulatory Visit (INDEPENDENT_AMBULATORY_CARE_PROVIDER_SITE_OTHER): Payer: Medicare Other | Admitting: *Deleted

## 2011-06-04 ENCOUNTER — Encounter: Payer: Self-pay | Admitting: Nurse Practitioner

## 2011-06-04 VITALS — BP 118/70 | HR 76 | Ht 71.0 in | Wt 201.0 lb

## 2011-06-04 DIAGNOSIS — I10 Essential (primary) hypertension: Secondary | ICD-10-CM

## 2011-06-04 DIAGNOSIS — I259 Chronic ischemic heart disease, unspecified: Secondary | ICD-10-CM

## 2011-06-04 LAB — BASIC METABOLIC PANEL
BUN: 12 mg/dL (ref 6–23)
CO2: 27 mEq/L (ref 19–32)
Calcium: 8.6 mg/dL (ref 8.4–10.5)
Chloride: 103 mEq/L (ref 96–112)
Creatinine, Ser: 1 mg/dL (ref 0.4–1.5)
GFR: 80.63 mL/min (ref >60.00)
Glucose, Bld: 249 mg/dL — ABNORMAL HIGH (ref 70–99)
Potassium: 3.9 mEq/L (ref 3.5–5.1)
Sodium: 138 mEq/L (ref 135–145)

## 2011-06-04 NOTE — Assessment & Plan Note (Signed)
No chest pain. He is doing well.

## 2011-06-04 NOTE — Progress Notes (Signed)
[name] Date of Birth: February 25, 1936 Medical Record #[id number]  History of Present Illness: Annette Stable is seen back today for a one month check. He is seen for Dr. Swaziland. We increased his ACE for his blood pressure. He had originally been sent here last month for an abnormal EKG done at his PCP's with his physical. There was concern that it showed a junctional rhythm. Review with Dr. Swaziland looked like sinus rhythm to [place]. Regardless, his blood pressure was up and we changed his medicines. He comes back in today for follow up. He is doing ok. No chest pain or shortness of breath. He has stopped the caffeine. He had been using it to make his heart rate go up and it probably caused some skips. Now his back is giving him some trouble. He attributes it to his mattress. Blood pressure at home is good. He has no chest pain or shortness of breath. No dizziness or lightheadedness.   Current Outpatient Prescriptions on File Prior to Visit  Medication Sig Dispense Refill  . Ascorbic Acid (VITAMIN C) 500 MG CAPS Take 1 capsule by mouth daily. ( Estra-C )       . aspirin 325 MG EC tablet Take 325 mg by mouth daily.        Marland Kitchen doxazosin (CARDURA) 4 MG tablet Take 4 mg by mouth at bedtime.        . fish oil-omega-3 fatty acids 1000 MG capsule Take 1 g by mouth 2 (two) times daily.        Marland Kitchen lisinopril (PRINIVIL,ZESTRIL) 5 MG tablet Take 2 tablets (10 mg total) by mouth daily.      . metoprolol (TOPROL-XL) 50 MG 24 hr tablet Take 50 mg by mouth daily.        . multivitamin (THERAGRAN) per tablet Take 1 tablet by mouth daily.        Marland Kitchen NITROSTAT 0.4 MG SL tablet Take 1 tablet by mouth as needed.      . simvastatin (ZOCOR) 20 MG tablet Take 20 mg by mouth at bedtime.          Allergies  Allergen Reactions  . Iodinated Diagnostic Agents     Past Medical History  Diagnosis Date  . Hypertension   . CAD (coronary artery disease)     Remote MI in 1980. CABG 1995. Stent to left main in 2004. Last cath in  April of 2012. EF 50%; patent LIMA to DX/LAD with collateralization of the distal right, patent SVG to OM, occluded SVG to distal RCA, and patent stent to LAD  . History of heart attack 1980  . Hypercholesteremia   . Obesity   . Increased glucose level     Past Surgical History  Procedure Date  . Cardiac catheterization 1993  . Coronary artery bypass graft 05/1994    x5 LIMA to LAD & DX, SVG to LCX, SVG to RCA  . Coronary stent placement 2004    Stent to the L main  . Cardiac catheterization April 2012    Mild reduction if EF at 50%. Patent LIMA to DX/LAD with collateralization to distal RCA, patent SVG to OM and occluded SVG to distal right and patent stent to LAD/Left main;    History  Smoking status  . Former Smoker -- 1.0 packs/day for 40 years  . Types: Cigarettes  . Quit date: 07/02/1994  Smokeless tobacco  . Not on file    History  Alcohol Use No  Family History  Problem Relation Age of Onset  . Heart attack Father   . Hypertension Father   . Stroke Mother     Review of Systems: The review of systems is per the HPI.  All other systems were reviewed and are negative.  Physical Exam: BP 118/70  Pulse 76  Ht 5\' 11"  (1.803 m)  Wt 201 lb (91.173 kg)  BMI 28.03 kg/m2 Patient is very pleasant and in no acute distress. Skin is warm and dry. Color is normal.  HEENT is unremarkable. Normocephalic/atraumatic. PERRL. Sclera are nonicteric. Neck is supple. No masses. No JVD. Lungs are clear. Cardiac exam shows a regular rate and rhythm. Abdomen is soft. Extremities are without edema. Gait and ROM are intact. No gross neurologic deficits noted.   LABORATORY DATA: BMET is pending.   Assessment / Plan:

## 2011-06-04 NOTE — Patient Instructions (Addendum)
Stay on your current medicines.  Monitor your blood pressure at home.   We will you back in about 4 months.  Call for any problems.   We will let you know about your blood work.

## 2011-06-04 NOTE — Assessment & Plan Note (Addendum)
Blood pressure looks better. We will keep him on his current medicines. He will continue to monitor at home. We will see him back in about 4 months. BMET is checked today. He is felt to be doing well from our standpoint. He will be calling back with his mail order info. Then we can send in RX for the 10mg  tablet of Lisinopril. Patient is agreeable to this plan and will call if any problems develop in the interim.

## 2011-06-09 NOTE — Progress Notes (Signed)
Dr. Jordan's patient  

## 2011-06-16 ENCOUNTER — Other Ambulatory Visit: Payer: Self-pay

## 2011-06-16 ENCOUNTER — Telehealth: Payer: Self-pay | Admitting: Cardiology

## 2011-06-16 ENCOUNTER — Encounter: Payer: Self-pay | Admitting: Nurse Practitioner

## 2011-06-16 DIAGNOSIS — I10 Essential (primary) hypertension: Secondary | ICD-10-CM

## 2011-06-16 MED ORDER — LISINOPRIL 5 MG PO TABS: 5.0000 mg | ORAL_TABLET | Freq: Two times a day (BID) | ORAL | Status: DC

## 2011-06-16 NOTE — Telephone Encounter (Signed)
New message:  Please call pres Lisinipril 10 mg into [facility]  90 day supply [phone]. Please call patient and let him know when this is called in.

## 2011-06-18 MED ORDER — LISINOPRIL 10 MG PO TABS: 10.0000 mg | ORAL_TABLET | Freq: Every day | ORAL | Status: DC

## 2011-06-18 NOTE — Telephone Encounter (Signed)
PER CONVERSATION WITH PT.CALL IN LISINOPRIL 10 MG NUMBER 30  WITH 3 REFILLS TO [facility]  ON [address].DONE AS REQUESTED PER PT./CY

## 2011-06-18 NOTE — Telephone Encounter (Signed)
F/up to previous problem He wants lisinipril 10mg  called to [facility] on [address] instead He has been having problems with [facility]

## 2011-09-21 ENCOUNTER — Encounter: Payer: Self-pay | Admitting: Cardiology

## 2011-10-14 ENCOUNTER — Ambulatory Visit (INDEPENDENT_AMBULATORY_CARE_PROVIDER_SITE_OTHER): Payer: Medicare Other | Admitting: Cardiology

## 2011-10-14 ENCOUNTER — Encounter: Payer: Self-pay | Admitting: Cardiology

## 2011-10-14 VITALS — BP 152/70 | HR 80 | Ht 71.0 in | Wt 202.8 lb

## 2011-10-14 DIAGNOSIS — E785 Hyperlipidemia, unspecified: Secondary | ICD-10-CM

## 2011-10-14 DIAGNOSIS — E7800 Pure hypercholesterolemia, unspecified: Secondary | ICD-10-CM

## 2011-10-14 DIAGNOSIS — I10 Essential (primary) hypertension: Secondary | ICD-10-CM

## 2011-10-14 DIAGNOSIS — I259 Chronic ischemic heart disease, unspecified: Secondary | ICD-10-CM

## 2011-10-14 DIAGNOSIS — I2510 Atherosclerotic heart disease of native coronary artery without angina pectoris: Secondary | ICD-10-CM

## 2011-10-14 DIAGNOSIS — Z951 Presence of aortocoronary bypass graft: Secondary | ICD-10-CM

## 2011-10-14 MED ORDER — NITROGLYCERIN 0.4 MG SL SUBL: 0.4000 mg | SUBLINGUAL_TABLET | SUBLINGUAL | Status: DC | PRN

## 2011-10-14 MED ORDER — METOPROLOL SUCCINATE ER 50 MG PO TB24: 50.0000 mg | ORAL_TABLET | Freq: Every day | ORAL | Status: DC

## 2011-10-14 MED ORDER — LISINOPRIL 10 MG PO TABS: 10.0000 mg | ORAL_TABLET | Freq: Every day | ORAL | Status: DC

## 2011-10-14 MED ORDER — DOXAZOSIN MESYLATE 4 MG PO TABS: 4.0000 mg | ORAL_TABLET | Freq: Every day | ORAL | Status: DC

## 2011-10-14 MED ORDER — SIMVASTATIN 20 MG PO TABS: 20.0000 mg | ORAL_TABLET | Freq: Every day | ORAL | Status: DC

## 2011-10-14 NOTE — Assessment & Plan Note (Signed)
Recent lipid panel showed good control on simvastatin.

## 2011-10-14 NOTE — Assessment & Plan Note (Signed)
He remains asymptomatic from a cardiac standpoint. I have encouraged him to increase his aerobic activity. Continue with his risk factor modification.

## 2011-10-14 NOTE — Progress Notes (Signed)
Larry Briggs Date of Birth: 1935/12/17 Medical Record #[id number]  History of Present Illness: Larry Briggs is seen back today for a followup visit today. He has a known history of coronary disease and status post coronary bypass surgery in 1995. He underwent stenting of the left main coronary in 2004. His last cardiac catheterization in April 2012 showed that everything was patent except for a vein graft to the distal right coronary. He continues to do fairly well. He does get short of breath occasionally going up stairs. He reports his blood pressure at home has been consistently less than 120 systolic and less than 80 diastolic. He denies any chest pain or increase in edema.  Current Outpatient Prescriptions on File Prior to Visit  Medication Sig Dispense Refill  . Ascorbic Acid (VITAMIN C) 500 MG CAPS Take 1 capsule by mouth daily. ( Estra-C )       . aspirin 325 MG EC tablet Take 325 mg by mouth daily.        . fish oil-omega-3 fatty acids 1000 MG capsule Take 1 g by mouth 2 (two) times daily.        . multivitamin (THERAGRAN) per tablet Take 1 tablet by mouth daily.        Marland Kitchen DISCONTD: doxazosin (CARDURA) 4 MG tablet Take 4 mg by mouth at bedtime.        Marland Kitchen DISCONTD: metoprolol (TOPROL-XL) 50 MG 24 hr tablet Take 50 mg by mouth daily.        Marland Kitchen DISCONTD: NITROSTAT 0.4 MG SL tablet Take 1 tablet by mouth as needed.      Marland Kitchen DISCONTD: simvastatin (ZOCOR) 20 MG tablet Take 20 mg by mouth at bedtime.          Allergies  Allergen Reactions  . Iodinated Diagnostic Agents     Past Medical History  Diagnosis Date  . Hypertension   . CAD (coronary artery disease)     Remote MI in 1980. CABG 1995. Stent to left main in 2004. Last cath in April of 2012. EF 50%; patent LIMA to DX/LAD with collateralization of the distal right, patent SVG to OM, occluded SVG to distal RCA, and patent stent to LAD  . History of heart attack 1980  . Hypercholesteremia   . Obesity   . Increased glucose level      Past Surgical History  Procedure Date  . Cardiac catheterization 1993  . Coronary artery bypass graft 05/1994    x5 LIMA to LAD & DX, SVG to LCX, SVG to RCA  . Coronary stent placement 2004    Stent to the L main  . Cardiac catheterization April 2012    Mild reduction if EF at 50%. Patent LIMA to DX/LAD with collateralization to distal RCA, patent SVG to OM and occluded SVG to distal right and patent stent to LAD/Left main;    History  Smoking status  . Former Smoker -- 1.0 packs/day for 40 years  . Types: Cigarettes  . Quit date: 07/02/1994  Smokeless tobacco  . Not on file    History  Alcohol Use No    Family History  Problem Relation Age of Onset  . Heart attack Father   . Hypertension Father   . Stroke Mother     Review of Systems: The review of systems is per the HPI.  All other systems were reviewed and are negative.  Physical Exam: BP 152/70  Pulse 80  Ht 5\' 11"  (1.803 m)  Wt  202 lb 12.8 oz (91.989 kg)  BMI 28.28 kg/m2 Patient is very pleasant and in no acute distress. Skin is warm and dry. Color is normal.  HEENT is unremarkable. Normocephalic/atraumatic. PERRL. Sclera are nonicteric. Neck is supple. No masses. No JVD. Lungs are clear. Cardiac exam shows a regular rate and rhythm. Abdomen is soft. Extremities are without edema. Gait and ROM are intact. No gross neurologic deficits noted.   LABORATORY DATA:  Dated 09/22/2011 complete chemistry panel was normal with the exception of glucose of 113. Total cholesterol 138, triglycerides 93, HDL 54, LDL 65. A1c was 5.9%.   Assessment / Plan:

## 2011-10-14 NOTE — Patient Instructions (Signed)
Continue your current medication.  I will have you see Lawson Fiscal in 6 months.

## 2011-10-14 NOTE — Assessment & Plan Note (Signed)
Blood pressure is elevated today but has been normal at home. We will ask him to monitor his blood pressure closely and keep a diary. He'll follow up again in 6 months.

## 2012-09-11 ENCOUNTER — Other Ambulatory Visit: Payer: Self-pay | Admitting: *Deleted

## 2012-09-11 MED ORDER — DOXAZOSIN MESYLATE 4 MG PO TABS: 4.0000 mg | ORAL_TABLET | Freq: Every day | ORAL | Status: DC

## 2012-10-31 ENCOUNTER — Other Ambulatory Visit: Payer: Self-pay | Admitting: Cardiology

## 2012-10-31 MED ORDER — SIMVASTATIN 20 MG PO TABS: 20.0000 mg | ORAL_TABLET | Freq: Every day | ORAL | Status: DC

## 2012-10-31 MED ORDER — METOPROLOL SUCCINATE ER 50 MG PO TB24: 50.0000 mg | ORAL_TABLET | Freq: Every day | ORAL | Status: DC

## 2012-10-31 NOTE — Telephone Encounter (Signed)
I will be sure to have Dr Swaziland s sch to  give pt an upcoming apt.

## 2013-03-16 DIAGNOSIS — J40 Bronchitis, not specified as acute or chronic: Secondary | ICD-10-CM | POA: Diagnosis not present

## 2013-04-20 DIAGNOSIS — Z23 Encounter for immunization: Secondary | ICD-10-CM | POA: Diagnosis not present

## 2013-05-22 DIAGNOSIS — R7989 Other specified abnormal findings of blood chemistry: Secondary | ICD-10-CM | POA: Diagnosis not present

## 2013-05-22 DIAGNOSIS — Z125 Encounter for screening for malignant neoplasm of prostate: Secondary | ICD-10-CM | POA: Diagnosis not present

## 2013-05-22 DIAGNOSIS — E7800 Pure hypercholesterolemia, unspecified: Secondary | ICD-10-CM | POA: Diagnosis not present

## 2013-05-23 DIAGNOSIS — E7800 Pure hypercholesterolemia, unspecified: Secondary | ICD-10-CM | POA: Diagnosis not present

## 2013-05-23 DIAGNOSIS — Z006 Encounter for examination for normal comparison and control in clinical research program: Secondary | ICD-10-CM | POA: Diagnosis not present

## 2013-05-23 DIAGNOSIS — Z0000 Encounter for general adult medical examination without abnormal findings: Secondary | ICD-10-CM | POA: Diagnosis not present

## 2013-05-23 DIAGNOSIS — I259 Chronic ischemic heart disease, unspecified: Secondary | ICD-10-CM | POA: Diagnosis not present

## 2013-05-23 DIAGNOSIS — I10 Essential (primary) hypertension: Secondary | ICD-10-CM | POA: Diagnosis not present

## 2013-05-24 DIAGNOSIS — I259 Chronic ischemic heart disease, unspecified: Secondary | ICD-10-CM | POA: Diagnosis not present

## 2013-05-24 DIAGNOSIS — R7989 Other specified abnormal findings of blood chemistry: Secondary | ICD-10-CM | POA: Diagnosis not present

## 2013-05-24 DIAGNOSIS — I10 Essential (primary) hypertension: Secondary | ICD-10-CM | POA: Diagnosis not present

## 2013-05-24 DIAGNOSIS — E7800 Pure hypercholesterolemia, unspecified: Secondary | ICD-10-CM | POA: Diagnosis not present

## 2013-06-21 ENCOUNTER — Encounter: Payer: Self-pay | Admitting: Cardiology

## 2013-06-21 DIAGNOSIS — R7989 Other specified abnormal findings of blood chemistry: Secondary | ICD-10-CM | POA: Diagnosis not present

## 2013-06-21 LAB — GLUCOSE, CAPILLARY

## 2013-06-25 DIAGNOSIS — R7989 Other specified abnormal findings of blood chemistry: Secondary | ICD-10-CM | POA: Diagnosis not present

## 2013-06-25 DIAGNOSIS — I10 Essential (primary) hypertension: Secondary | ICD-10-CM | POA: Diagnosis not present

## 2013-06-25 DIAGNOSIS — E7800 Pure hypercholesterolemia, unspecified: Secondary | ICD-10-CM | POA: Diagnosis not present

## 2013-06-25 DIAGNOSIS — I259 Chronic ischemic heart disease, unspecified: Secondary | ICD-10-CM | POA: Diagnosis not present

## 2013-07-23 DIAGNOSIS — R7989 Other specified abnormal findings of blood chemistry: Secondary | ICD-10-CM | POA: Diagnosis not present

## 2013-07-23 DIAGNOSIS — I259 Chronic ischemic heart disease, unspecified: Secondary | ICD-10-CM | POA: Diagnosis not present

## 2013-07-23 DIAGNOSIS — I10 Essential (primary) hypertension: Secondary | ICD-10-CM | POA: Diagnosis not present

## 2013-07-23 DIAGNOSIS — E7800 Pure hypercholesterolemia, unspecified: Secondary | ICD-10-CM | POA: Diagnosis not present

## 2013-08-06 ENCOUNTER — Ambulatory Visit (INDEPENDENT_AMBULATORY_CARE_PROVIDER_SITE_OTHER): Payer: Medicare Other | Admitting: Cardiology

## 2013-08-06 ENCOUNTER — Encounter: Payer: Self-pay | Admitting: Cardiology

## 2013-08-06 VITALS — BP 130/60 | HR 80 | Ht 71.0 in | Wt 192.0 lb

## 2013-08-06 DIAGNOSIS — I2510 Atherosclerotic heart disease of native coronary artery without angina pectoris: Secondary | ICD-10-CM | POA: Diagnosis not present

## 2013-08-06 DIAGNOSIS — I7090 Unspecified atherosclerosis: Secondary | ICD-10-CM | POA: Diagnosis not present

## 2013-08-06 DIAGNOSIS — E7800 Pure hypercholesterolemia, unspecified: Secondary | ICD-10-CM

## 2013-08-06 DIAGNOSIS — I10 Essential (primary) hypertension: Secondary | ICD-10-CM

## 2013-08-06 MED ORDER — DOXAZOSIN MESYLATE 4 MG PO TABS: 4.0000 mg | ORAL_TABLET | Freq: Every day | ORAL | Status: DC

## 2013-08-06 MED ORDER — NITROGLYCERIN 0.4 MG SL SUBL: 0.4000 mg | SUBLINGUAL_TABLET | SUBLINGUAL | Status: DC | PRN

## 2013-08-06 MED ORDER — SIMVASTATIN 20 MG PO TABS: 20.0000 mg | ORAL_TABLET | Freq: Every day | ORAL | Status: DC

## 2013-08-06 MED ORDER — METOPROLOL SUCCINATE ER 50 MG PO TB24: 50.0000 mg | ORAL_TABLET | Freq: Every day | ORAL | Status: DC

## 2013-08-06 MED ORDER — LISINOPRIL 10 MG PO TABS: 10.0000 mg | ORAL_TABLET | Freq: Every day | ORAL | Status: DC

## 2013-08-06 NOTE — Patient Instructions (Signed)
We will get a copy of your lab work from Dr. Shelia Media.  Continue your other therapy  I will see you in one year.

## 2013-08-06 NOTE — Progress Notes (Signed)
Larry Briggs Date of Birth: 1936-03-13 Medical Record #[id number]  History of Present Illness: Larry Briggs is seen back today for a followup visit today. He has a known history of coronary disease and status post coronary bypass surgery in 1995. He underwent stenting of the left main coronary in 2004. His last cardiac catheterization in April 2012 showed that everything was patent except for a vein graft to the distal right coronary. He continues to do  well.  He denies any chest pain, SOB, or increase in edema. He walks on a treadmill 30 minutes a day.  Current Outpatient Prescriptions on File Prior to Visit  Medication Sig Dispense Refill  . Ascorbic Acid (VITAMIN C) 500 MG CAPS Take 1 capsule by mouth daily. ( Estra-C )       . aspirin 325 MG EC tablet Take 325 mg by mouth daily.        Marland Kitchen doxazosin (CARDURA) 4 MG tablet Take 1 tablet (4 mg total) by mouth at bedtime.  90 tablet  3  . fish oil-omega-3 fatty acids 1000 MG capsule Take 1 g by mouth 2 (two) times daily.        Marland Kitchen lisinopril (PRINIVIL,ZESTRIL) 10 MG tablet TAKE ONE TABLET BY MOUTH EVERY DAY  90 tablet  3  . metoprolol succinate (TOPROL-XL) 50 MG 24 hr tablet Take 1 tablet (50 mg total) by mouth daily.  90 tablet  3  . multivitamin (THERAGRAN) per tablet Take 1 tablet by mouth daily.        . nitroGLYCERIN (NITROSTAT) 0.4 MG SL tablet Take 1 tablet (0.4 mg total) by mouth as needed.  25 tablet  3  . simvastatin (ZOCOR) 20 MG tablet Take 1 tablet (20 mg total) by mouth at bedtime.  90 tablet  3   No current facility-administered medications on file prior to visit.    Allergies  Allergen Reactions  . Iodinated Diagnostic Agents     Past Medical History  Diagnosis Date  . Hypertension   . CAD (coronary artery disease)     Remote MI in 1980. CABG 1995. Stent to left main in 2004. Last cath in April of 2012. EF 50%; patent LIMA to DX/LAD with collateralization of the distal right, patent SVG to OM, occluded SVG to distal RCA,  and patent stent to LAD  . History of heart attack 1980  . Hypercholesteremia   . Obesity   . Increased glucose level     Past Surgical History  Procedure Laterality Date  . Cardiac catheterization  1993  . Coronary artery bypass graft  05/1994    x5 LIMA to LAD & DX, SVG to LCX, SVG to RCA  . Coronary stent placement  2004    Stent to the L main  . Cardiac catheterization  April 2012    Mild reduction if EF at 50%. Patent LIMA to DX/LAD with collateralization to distal RCA, patent SVG to OM and occluded SVG to distal right and patent stent to LAD/Left main;    History  Smoking status  . Former Smoker -- 1.00 packs/day for 40 years  . Types: Cigarettes  . Quit date: 07/02/1994  Smokeless tobacco  . Not on file    History  Alcohol Use No    Family History  Problem Relation Age of Onset  . Heart attack Father   . Hypertension Father   . Stroke Mother     Review of Systems: The review of systems is per the HPI.  All other systems were reviewed and are negative.  Physical Exam: BP 130/60  Pulse 80  Ht 5\' 11"  (1.803 m)  Wt 192 lb (87.091 kg)  BMI 26.79 kg/m2 Patient is very pleasant and in no acute distress. Skin is warm and dry. Color is normal.  HEENT is unremarkable. Normocephalic/atraumatic. PERRL. Sclera are nonicteric. Neck is supple. No masses. No JVD. Lungs are clear. Cardiac exam shows a regular rate and rhythm. Abdomen is soft. Extremities are without edema. Gait and ROM are intact. No gross neurologic deficits noted.   LABORATORY DATA:  Ecg: NSR rate 80 bpm, nonspecific TWA.   Assessment / Plan: 1. CAD s/p CABG. S/p stenting of the left main coronary artery. Asymptomatic. Continue current antianginal Rx. I will follow up in one year.  2. HTN- well controlled.  3. Hyperlipidemia. Will request a copy of most recent labs from primary care.

## 2013-10-23 DIAGNOSIS — R7989 Other specified abnormal findings of blood chemistry: Secondary | ICD-10-CM | POA: Diagnosis not present

## 2013-10-23 DIAGNOSIS — E7800 Pure hypercholesterolemia, unspecified: Secondary | ICD-10-CM | POA: Diagnosis not present

## 2013-11-21 DIAGNOSIS — R7989 Other specified abnormal findings of blood chemistry: Secondary | ICD-10-CM | POA: Diagnosis not present

## 2013-11-21 DIAGNOSIS — I259 Chronic ischemic heart disease, unspecified: Secondary | ICD-10-CM | POA: Diagnosis not present

## 2013-11-21 DIAGNOSIS — I10 Essential (primary) hypertension: Secondary | ICD-10-CM | POA: Diagnosis not present

## 2013-11-21 DIAGNOSIS — E7800 Pure hypercholesterolemia, unspecified: Secondary | ICD-10-CM | POA: Diagnosis not present

## 2014-02-11 DIAGNOSIS — H2510 Age-related nuclear cataract, unspecified eye: Secondary | ICD-10-CM | POA: Diagnosis not present

## 2014-05-17 DIAGNOSIS — Z23 Encounter for immunization: Secondary | ICD-10-CM | POA: Diagnosis not present

## 2014-06-10 DIAGNOSIS — Z0000 Encounter for general adult medical examination without abnormal findings: Secondary | ICD-10-CM | POA: Diagnosis not present

## 2014-06-10 DIAGNOSIS — E780 Pure hypercholesterolemia: Secondary | ICD-10-CM | POA: Diagnosis not present

## 2014-06-10 DIAGNOSIS — Z125 Encounter for screening for malignant neoplasm of prostate: Secondary | ICD-10-CM | POA: Diagnosis not present

## 2014-06-13 DIAGNOSIS — E785 Hyperlipidemia, unspecified: Secondary | ICD-10-CM | POA: Diagnosis not present

## 2014-06-13 DIAGNOSIS — I10 Essential (primary) hypertension: Secondary | ICD-10-CM | POA: Diagnosis not present

## 2014-06-13 DIAGNOSIS — R7309 Other abnormal glucose: Secondary | ICD-10-CM | POA: Diagnosis not present

## 2014-06-14 DIAGNOSIS — D2272 Melanocytic nevi of left lower limb, including hip: Secondary | ICD-10-CM | POA: Diagnosis not present

## 2014-06-14 DIAGNOSIS — Z1212 Encounter for screening for malignant neoplasm of rectum: Secondary | ICD-10-CM | POA: Diagnosis not present

## 2014-06-14 DIAGNOSIS — E790 Hyperuricemia without signs of inflammatory arthritis and tophaceous disease: Secondary | ICD-10-CM | POA: Diagnosis not present

## 2014-06-14 DIAGNOSIS — Z7982 Long term (current) use of aspirin: Secondary | ICD-10-CM | POA: Diagnosis not present

## 2014-06-14 DIAGNOSIS — Z23 Encounter for immunization: Secondary | ICD-10-CM | POA: Diagnosis not present

## 2014-06-14 DIAGNOSIS — E780 Pure hypercholesterolemia: Secondary | ICD-10-CM | POA: Diagnosis not present

## 2014-06-14 DIAGNOSIS — I10 Essential (primary) hypertension: Secondary | ICD-10-CM | POA: Diagnosis not present

## 2014-06-19 ENCOUNTER — Encounter: Payer: Self-pay | Admitting: Cardiology

## 2014-09-12 ENCOUNTER — Telehealth: Payer: Self-pay | Admitting: Cardiology

## 2014-09-12 NOTE — Telephone Encounter (Signed)
New problem:    Pt called to have DOXAZOSIN 4mg  called in please he has 10 left. appt 3/24.   Please call when done.

## 2014-09-16 ENCOUNTER — Other Ambulatory Visit: Payer: Self-pay | Admitting: Nurse Practitioner

## 2014-09-16 ENCOUNTER — Other Ambulatory Visit: Payer: Self-pay

## 2014-09-16 MED ORDER — DOXAZOSIN MESYLATE 4 MG PO TABS: 4.0000 mg | ORAL_TABLET | Freq: Every day | ORAL | Status: DC

## 2014-09-16 MED ORDER — METOPROLOL SUCCINATE ER 50 MG PO TB24: 50.0000 mg | ORAL_TABLET | Freq: Every day | ORAL | Status: DC

## 2014-09-16 MED ORDER — SIMVASTATIN 20 MG PO TABS: 20.0000 mg | ORAL_TABLET | Freq: Every day | ORAL | Status: DC

## 2014-10-23 ENCOUNTER — Other Ambulatory Visit: Payer: Self-pay

## 2014-10-23 MED ORDER — LISINOPRIL 10 MG PO TABS: 10.0000 mg | ORAL_TABLET | Freq: Every day | ORAL | Status: DC

## 2014-10-23 MED ORDER — DOXAZOSIN MESYLATE 4 MG PO TABS: 4.0000 mg | ORAL_TABLET | Freq: Every day | ORAL | Status: DC

## 2014-10-23 MED ORDER — METOPROLOL SUCCINATE ER 50 MG PO TB24: 50.0000 mg | ORAL_TABLET | Freq: Every day | ORAL | Status: DC

## 2014-10-23 MED ORDER — SIMVASTATIN 20 MG PO TABS: 20.0000 mg | ORAL_TABLET | Freq: Every day | ORAL | Status: DC

## 2014-10-24 ENCOUNTER — Ambulatory Visit (INDEPENDENT_AMBULATORY_CARE_PROVIDER_SITE_OTHER): Payer: Medicare Other | Admitting: Cardiology

## 2014-10-24 ENCOUNTER — Encounter: Payer: Self-pay | Admitting: Cardiology

## 2014-10-24 VITALS — BP 138/80 | HR 67 | Ht 71.0 in | Wt 201.7 lb

## 2014-10-24 DIAGNOSIS — I2510 Atherosclerotic heart disease of native coronary artery without angina pectoris: Secondary | ICD-10-CM | POA: Diagnosis not present

## 2014-10-24 DIAGNOSIS — E7800 Pure hypercholesterolemia, unspecified: Secondary | ICD-10-CM

## 2014-10-24 DIAGNOSIS — I10 Essential (primary) hypertension: Secondary | ICD-10-CM | POA: Diagnosis not present

## 2014-10-24 NOTE — Patient Instructions (Signed)
Continue your current therapy  You need to lose weight and get more exercise.   I will see you in one year.

## 2014-10-24 NOTE — Progress Notes (Signed)
Larry Briggs Date of Birth: September 12, 1935 Medical Record #[id number]  History of Present Illness: Larry Briggs is seen back today for follow up CAD. He has a known history of coronary disease and status post coronary bypass surgery in 1995. He underwent stenting of the left main coronary in 2004. His last cardiac catheterization in April 2012 showed that everything was patent except for a vein graft to the distal right coronary. The PDA was supplied by left to right collaterals. He continues to do  well.  He denies any chest pain, SOB, or increase in edema. He hasn't been walking regularly and has gained 9 lbs this year. No chest pain or SOB. No palpitations.   Current Outpatient Prescriptions on File Prior to Visit  Medication Sig Dispense Refill  . Ascorbic Acid (VITAMIN C) 500 MG CAPS Take 1 capsule by mouth daily. ( Estra-C )     . aspirin 325 MG EC tablet Take 325 mg by mouth daily.      Marland Kitchen doxazosin (CARDURA) 4 MG tablet Take 1 tablet (4 mg total) by mouth at bedtime. 90 tablet 1  . fish oil-omega-3 fatty acids 1000 MG capsule Take 1 g by mouth 2 (two) times daily.      Marland Kitchen lisinopril (PRINIVIL,ZESTRIL) 10 MG tablet Take 1 tablet (10 mg total) by mouth daily. 90 tablet 1  . metoprolol succinate (TOPROL-XL) 50 MG 24 hr tablet Take 1 tablet (50 mg total) by mouth daily. 90 tablet 1  . nitroGLYCERIN (NITROSTAT) 0.4 MG SL tablet Place 1 tablet (0.4 mg total) under the tongue as needed. 25 tablet 3  . simvastatin (ZOCOR) 20 MG tablet Take 1 tablet (20 mg total) by mouth at bedtime. 90 tablet 0   No current facility-administered medications on file prior to visit.    Allergies  Allergen Reactions  . Iodinated Diagnostic Agents     Past Medical History  Diagnosis Date  . Hypertension   . CAD (coronary artery disease)     Remote MI in 1980. CABG 1995. Stent to left main in 2004. Last cath in April of 2012. EF 50%; patent LIMA to DX/LAD with collateralization of the distal right, patent SVG to  OM, occluded SVG to distal RCA, and patent stent to LAD  . History of heart attack 1980  . Hypercholesteremia   . Obesity   . Increased glucose level     Past Surgical History  Procedure Laterality Date  . Cardiac catheterization  1993  . Coronary artery bypass graft  05/1994    x5 LIMA to LAD & DX, SVG to LCX, SVG to RCA  . Coronary stent placement  2004    Stent to the L main  . Cardiac catheterization  April 2012    Mild reduction if EF at 50%. Patent LIMA to DX/LAD with collateralization to distal RCA, patent SVG to OM and occluded SVG to distal right and patent stent to LAD/Left main;    History  Smoking status  . Former Smoker -- 1.00 packs/day for 40 years  . Types: Cigarettes  . Quit date: 07/02/1994  Smokeless tobacco  . Not on file    History  Alcohol Use No    Family History  Problem Relation Age of Onset  . Heart attack Father   . Hypertension Father   . Stroke Mother     Review of Systems: The review of systems is per the HPI.  All other systems were reviewed and are negative.  Physical Exam:  BP 138/80 mmHg  Pulse 67  Ht 5\' 11"  (1.803 m)  Wt 201 lb 11.2 oz (91.491 kg)  BMI 28.14 kg/m2 Patient is very pleasant, obese WM and in no acute distress. Skin is warm and dry. Color is normal.  HEENT is unremarkable. Normocephalic/atraumatic. PERRL. Sclera are nonicteric. Neck is supple. No masses. No JVD. Lungs are clear. Cardiac exam shows a regular rate and rhythm. Normal S1-2. No gallop or murmur. Abdomen is soft. Extremities are without edema. Gait and ROM are intact. No gross neurologic deficits noted.   LABORATORY DATA:  Ecg: today NSR with frequent PACs rate 67 bpm, otherwise normal. I have personally reviewed and interpreted this study.    Assessment / Plan: 1. CAD s/p CABG 1995. S/p stenting of the left main coronary artery 2004.  Cath 2012 showed occlusion of SVG to diagonal. Asymptomatic. Continue current antianginal Rx. He is really not  interested in stress testing at this time. I will follow up in one year.  2. HTN- well controlled.  3. Hyperlipidemia. Labs reviewed from Dr. Shelia Media in November and lipids are well controlled. Continue statin and fish oil.

## 2014-10-28 ENCOUNTER — Telehealth: Payer: Self-pay

## 2014-10-28 MED ORDER — LISINOPRIL 10 MG PO TABS: 10.0000 mg | ORAL_TABLET | Freq: Every day | ORAL | Status: DC

## 2014-10-28 MED ORDER — METOPROLOL SUCCINATE ER 50 MG PO TB24: 50.0000 mg | ORAL_TABLET | Freq: Every day | ORAL | Status: DC

## 2014-10-28 MED ORDER — SIMVASTATIN 20 MG PO TABS: 20.0000 mg | ORAL_TABLET | Freq: Every day | ORAL | Status: DC

## 2014-10-28 NOTE — Telephone Encounter (Signed)
Received a call from patient he stated he needed 90 day refill for lisinopril.Also needs refills for simvastatin and metoprolol.Refills sent to [facility] mail order pharmacy.

## 2014-11-06 ENCOUNTER — Other Ambulatory Visit: Payer: Self-pay

## 2014-11-06 MED ORDER — METOPROLOL SUCCINATE ER 50 MG PO TB24: 50.0000 mg | ORAL_TABLET | Freq: Every day | ORAL | Status: DC

## 2014-11-06 MED ORDER — SIMVASTATIN 20 MG PO TABS: 20.0000 mg | ORAL_TABLET | Freq: Every day | ORAL | Status: DC

## 2014-12-10 DIAGNOSIS — R7309 Other abnormal glucose: Secondary | ICD-10-CM | POA: Diagnosis not present

## 2014-12-10 DIAGNOSIS — E785 Hyperlipidemia, unspecified: Secondary | ICD-10-CM | POA: Diagnosis not present

## 2014-12-10 DIAGNOSIS — I10 Essential (primary) hypertension: Secondary | ICD-10-CM | POA: Diagnosis not present

## 2014-12-12 DIAGNOSIS — I10 Essential (primary) hypertension: Secondary | ICD-10-CM | POA: Diagnosis not present

## 2014-12-12 DIAGNOSIS — R7309 Other abnormal glucose: Secondary | ICD-10-CM | POA: Diagnosis not present

## 2014-12-12 DIAGNOSIS — E785 Hyperlipidemia, unspecified: Secondary | ICD-10-CM | POA: Diagnosis not present

## 2015-04-17 ENCOUNTER — Telehealth: Payer: Self-pay | Admitting: Cardiology

## 2015-04-17 NOTE — Telephone Encounter (Signed)
[name] is calling about Larry Briggs blood pressure being elevated . This morning he took his bp medication and it is still high and he has had a nose bleed . Also wants to know if he should be taking Cialitis  Please call .Marland Kitchen  Thanks

## 2015-04-17 NOTE — Telephone Encounter (Signed)
Spoke to patient. He notes all meds same as listed except aspirin - he cut dose from 325mg  to 81mg .  He reports epistaxis resolved, his BP when checked was 159/89, HR 65.  Notes typically not a problem.  He has not checked daily, but notes usually BP OK - 720-947 systolic. He notes stress related to wife preparing for a trip out of country - he is having some stress and concern about them being apart - asked if this could be a factor. Also asked if OK to do treadmill at home.  Acknowledged this - informed him stress could cause BP elevation - advised continue normal routine, continue exercise. Advised 1 week of BP readings w/ HR, keep diary and submit to [place] to determine if med adjustment needed.  Informed patient I would defer to Dr. Martinique for any additional advice in interim, o/w instructed pt to call back next week w/ BPs or sooner if new concerns. Pt voiced understanding.

## 2015-04-17 NOTE — Telephone Encounter (Signed)
Agree with recommendations  Peter Jordan MD, FACC  

## 2015-04-24 ENCOUNTER — Telehealth: Payer: Self-pay | Admitting: Cardiology

## 2015-04-24 NOTE — Telephone Encounter (Signed)
Patint called back reporting BP's  9/16  162/85  75  1020am          147/76  76  1350pm           98/73   66  2330pm 9/17  171/87  72  1115am          153/82  81  1827pm 9/18  140/76  76  1345pm          134/81  75  2115pm 9/19  163/85  66  1037am          147/79  61  1430pm          150/82  61  1736pm 9/20  158/91  51  1330pm 9/21  142/79  69  1209pm          137/79  61  1843pm 9/22  154/88  78  1000am          132/56  76  1356pm

## 2015-04-24 NOTE — Telephone Encounter (Signed)
Pt is calling to report his BP readings.   Thanks

## 2015-04-26 NOTE — Telephone Encounter (Signed)
BP is consistently running high. I would recommend increasing lisinopril to 20 mg daily and continue to monitor.  Vladislav Axelson Martinique MD, [facility]

## 2015-04-28 MED ORDER — LISINOPRIL 20 MG PO TABS: 20.0000 mg | ORAL_TABLET | Freq: Every day | ORAL | Status: DC

## 2015-04-28 NOTE — Addendum Note (Signed)
Addended by: Golden Hurter D on: 04/28/2015 09:36 AM   Modules accepted: Orders, Medications

## 2015-04-28 NOTE — Telephone Encounter (Signed)
Returned call to patient no answer.LMTC. 

## 2015-04-28 NOTE — Telephone Encounter (Signed)
Received call from patient.Dr.Jordan advised to increase Lisinopril to 20 mg daily.Advised to continue to monitor and call back if elevated.

## 2015-05-16 DIAGNOSIS — Z23 Encounter for immunization: Secondary | ICD-10-CM | POA: Diagnosis not present

## 2015-06-06 ENCOUNTER — Telehealth: Payer: Self-pay | Admitting: Cardiology

## 2015-06-06 MED ORDER — DOXAZOSIN MESYLATE 4 MG PO TABS: 4.0000 mg | ORAL_TABLET | Freq: Every day | ORAL | Status: DC

## 2015-06-06 NOTE — Telephone Encounter (Signed)
E-SENT MEDICATION #90 X 3 REFILL LEFT MESSAGE FOR PATIENT ON VOICEMAIL

## 2015-06-06 NOTE — Telephone Encounter (Signed)
Patient needs for [place] to call [facility] Mail order with new prescription for his Doxazosin 4 mg #90 with 3 refills.  He takes one tab daily.

## 2015-07-17 DIAGNOSIS — I10 Essential (primary) hypertension: Secondary | ICD-10-CM | POA: Diagnosis not present

## 2015-07-17 DIAGNOSIS — Z7982 Long term (current) use of aspirin: Secondary | ICD-10-CM | POA: Diagnosis not present

## 2015-07-17 DIAGNOSIS — Z125 Encounter for screening for malignant neoplasm of prostate: Secondary | ICD-10-CM | POA: Diagnosis not present

## 2015-07-17 DIAGNOSIS — E7800 Pure hypercholesterolemia, unspecified: Secondary | ICD-10-CM | POA: Diagnosis not present

## 2015-07-17 DIAGNOSIS — Z0000 Encounter for general adult medical examination without abnormal findings: Secondary | ICD-10-CM | POA: Diagnosis not present

## 2015-07-23 DIAGNOSIS — Z125 Encounter for screening for malignant neoplasm of prostate: Secondary | ICD-10-CM | POA: Diagnosis not present

## 2015-07-23 DIAGNOSIS — E7800 Pure hypercholesterolemia, unspecified: Secondary | ICD-10-CM | POA: Diagnosis not present

## 2015-07-23 DIAGNOSIS — I10 Essential (primary) hypertension: Secondary | ICD-10-CM | POA: Diagnosis not present

## 2015-07-23 DIAGNOSIS — Z7982 Long term (current) use of aspirin: Secondary | ICD-10-CM | POA: Diagnosis not present

## 2015-09-30 DIAGNOSIS — M5412 Radiculopathy, cervical region: Secondary | ICD-10-CM | POA: Diagnosis not present

## 2015-09-30 DIAGNOSIS — M19012 Primary osteoarthritis, left shoulder: Secondary | ICD-10-CM | POA: Diagnosis not present

## 2015-12-10 DIAGNOSIS — R7309 Other abnormal glucose: Secondary | ICD-10-CM | POA: Diagnosis not present

## 2015-12-10 DIAGNOSIS — E785 Hyperlipidemia, unspecified: Secondary | ICD-10-CM | POA: Diagnosis not present

## 2015-12-10 DIAGNOSIS — I10 Essential (primary) hypertension: Secondary | ICD-10-CM | POA: Diagnosis not present

## 2015-12-15 DIAGNOSIS — E785 Hyperlipidemia, unspecified: Secondary | ICD-10-CM | POA: Diagnosis not present

## 2015-12-15 DIAGNOSIS — I10 Essential (primary) hypertension: Secondary | ICD-10-CM | POA: Diagnosis not present

## 2015-12-15 DIAGNOSIS — S8002XA Contusion of left knee, initial encounter: Secondary | ICD-10-CM | POA: Diagnosis not present

## 2015-12-15 DIAGNOSIS — R7309 Other abnormal glucose: Secondary | ICD-10-CM | POA: Diagnosis not present

## 2016-01-01 DIAGNOSIS — M25562 Pain in left knee: Secondary | ICD-10-CM | POA: Diagnosis not present

## 2016-01-01 DIAGNOSIS — S83242A Other tear of medial meniscus, current injury, left knee, initial encounter: Secondary | ICD-10-CM | POA: Diagnosis not present

## 2016-02-05 ENCOUNTER — Other Ambulatory Visit: Payer: Self-pay | Admitting: Cardiology

## 2016-02-05 MED ORDER — SIMVASTATIN 20 MG PO TABS: 20.0000 mg | ORAL_TABLET | Freq: Every day | ORAL | Status: DC

## 2016-02-05 MED ORDER — METOPROLOL SUCCINATE ER 50 MG PO TB24: 50.0000 mg | ORAL_TABLET | Freq: Every day | ORAL | Status: DC

## 2016-02-05 NOTE — Telephone Encounter (Signed)
Called patient and informed him that meds were sent to [facility] and due to him not being seen in a year a scheduler will call him tomorrow to make appointment; patient voiced understanding

## 2016-02-05 NOTE — Telephone Encounter (Signed)
°*  STAT* If patient is at the pharmacy, call can be transferred to refill team.   1. Which medications need to be refilled? (please list name of each medication and dose if known) Metoprolol ER and Simvastatin  2. Which pharmacy/location (including street and city if local pharmacy) is medication to be sent to?[facility]-[phone]  3. Do they need a 30 day or 90 day supply? 90 days and refills

## 2016-02-23 ENCOUNTER — Telehealth: Payer: Self-pay | Admitting: Cardiology

## 2016-02-23 DIAGNOSIS — R05 Cough: Secondary | ICD-10-CM | POA: Diagnosis not present

## 2016-02-23 DIAGNOSIS — J069 Acute upper respiratory infection, unspecified: Secondary | ICD-10-CM | POA: Diagnosis not present

## 2016-02-23 NOTE — Telephone Encounter (Signed)
Called patient - wife is not on DPR  He states his BP has been running low He is not sure his cuff is working right  7/24 128/60, 117/68, 102/63, 116/52 (took BP 15-20 minutes apart)  7/19 146/76  7/16 111/65  7/14 118/64, 99/49  7/13 142/62  7/07 106/50  Patient states he just doesn't feel good - when BP really low he feels "swimmy headed"    Patient states he has lost a bunch of weight (down about 20lbs), wonders if this is making his BP be lower in combo with all the meds he takes.   He states he is going to his PCP today for a cough   Offered hypertension clinic appointment - no availability that I noticed for PA/NP this week. Explained that clinical pharmacy staff can address BP issues but not cardiac issues such as chest pain/SOB.   Patient agreed to come in for appointment 02/24/16 @ 0830. He was advised to bring BP cuff w/readings stored to this appointment.

## 2016-02-23 NOTE — Telephone Encounter (Signed)
New message      The wife is asking for the pt to see Dr. Martinique anything this week between tomorrow thur Friday with issues the pt is having blood pressure to low and the wife seems to think it is the medications.     Pt c/o medication issue:  1. Name of Medication: Metoprolol, LIsinopril, Doxazosin,Simvastatin  2. How are you currently taking this medication (dosage and times per day)? 50 mg po daily, 20 mg po daily,4 mg po daily, 20 mg po daily pt take at night  3. Are you having a reaction (difficulty breathing--STAT)? no  4. What is your medication issue? The wife seems to think it is medications   The wife states the appointment needs to be this week the couple is going on vacation next week

## 2016-02-24 ENCOUNTER — Ambulatory Visit (INDEPENDENT_AMBULATORY_CARE_PROVIDER_SITE_OTHER): Payer: Medicare Other | Admitting: Pharmacist

## 2016-02-24 VITALS — BP 148/78 | HR 87 | Wt 179.8 lb

## 2016-02-24 DIAGNOSIS — I10 Essential (primary) hypertension: Secondary | ICD-10-CM | POA: Diagnosis not present

## 2016-02-24 MED ORDER — VALSARTAN 80 MG PO TABS: 80.0000 mg | ORAL_TABLET | Freq: Every day | ORAL | 2 refills | Status: DC

## 2016-02-24 NOTE — Patient Instructions (Signed)
Return for a a follow up appointment in 2 - 3 weeks  Your blood pressure today is 148/78   Check your blood pressure at home daily (if able) and keep record of the readings.  Take your BP meds as follows: Stop taking Doxazosin and Lisinopril  Start taking Valsartan 80mg  once daily in the evening   Bring all of your meds, your BP cuff and your record of home blood pressures to your next appointment.  Exercise as you're able, try to walk approximately 30 minutes per day.  Keep salt intake to a minimum, especially watch canned and prepared boxed foods.  Eat more fresh fruits and vegetables and fewer canned items.  Avoid eating in fast food restaurants.    HOW TO TAKE YOUR BLOOD PRESSURE: . Rest 5 minutes before taking your blood pressure. .  Don't smoke or drink caffeinated beverages for at least 30 minutes before. . Take your blood pressure before (not after) you eat. . Sit comfortably with your back supported and both feet on the floor (don't cross your legs). . Elevate your arm to heart level on a table or a desk. . Use the proper sized cuff. It should fit smoothly and snugly around your bare upper arm. There should be enough room to slip a fingertip under the cuff. The bottom edge of the cuff should be 1 inch above the crease of the elbow. . Ideally, take 3 measurements at one sitting and record the average.

## 2016-02-24 NOTE — Progress Notes (Signed)
Patient ID: KHAMARION LEAK                 DOB: November 22, 1935                      MRN: [id number]     HPI: Larry Briggs is a 80 y.o. male patient of Dr. Martinique who presents with his wife today for hypertension evaluation. It has been over a year since he was seen by Dr. Martinique and he will make an appt to follow up with him today.  He recently has lost a significant amount of weight. He reports he has not been actively trying to lose weight but also has been trying to eat less. He reports that he has had decreased appetite which he feels is good.   He was seen at his primary care doctor a few days ago at which time he was started on a short course of Doxycycline 100mg  BID and mucinex 600mg  BID for bronchitis.   Over the last month or so he reports that he has had periods of low blood pressure. He states he becomes very dizzy and feels poorly. This is especially true when he stands and when he bends over. He feels this has gotten progressively worse with the more weight he has lost.   He also reports that he has had a dry cough for several years. His primary care recommended that we switch his lisinopril to see if this will help with his cough.    Current HTN meds:  Doxazosin 4mg  once daily in the evening Lisinopril 20mg  once daily in the evening Metoprolol XL 50mg  once daily in the evening  BP goal: <150/90  Family History: Mother passed of a hemorraghic stroke. His father passed from a MI. He has 2 sisters one of which recently passed from a stroke.   Diet: He endorses eating out occasionally. His wife reports that they use "light salt" at home. He admits he has not been great about keeping hydrated with the heat but he does not believe all of the low blood pressures are associated with dehyration.   Exercise: denies exercise  Home BP readings: wrist cuff which is >67 years old.   Wt Readings from Last 3 Encounters:  10/24/14 201 lb 11.2 oz (91.5 kg)  08/06/13 192 lb (87.1 kg)    10/14/11 202 lb 12.8 oz (92 kg)   BP Readings from Last 3 Encounters:  10/24/14 138/80  08/06/13 130/60  10/14/11 (!) 152/70   Pulse Readings from Last 3 Encounters:  10/24/14 67  08/06/13 80  10/14/11 80    Renal function: CrCl cannot be calculated (Unknown ideal weight.).  Past Medical History:  Diagnosis Date  . CAD (coronary artery disease)    Remote MI in 1980. CABG 1995. Stent to left main in 2004. Last cath in April of 2012. EF 50%; patent LIMA to DX/LAD with collateralization of the distal right, patent SVG to OM, occluded SVG to distal RCA, and patent stent to LAD  . History of heart attack 1980  . Hypercholesteremia   . Hypertension   . Increased glucose level   . Obesity     Current Outpatient Prescriptions on File Prior to Visit  Medication Sig Dispense Refill  . Ascorbic Acid (VITAMIN C) 500 MG CAPS Take 1 capsule by mouth daily. ( Estra-C )     . aspirin 325 MG EC tablet Take 325 mg by mouth daily.      Marland Kitchen  doxazosin (CARDURA) 4 MG tablet Take 1 tablet (4 mg total) by mouth at bedtime. 90 tablet 3  . fish oil-omega-3 fatty acids 1000 MG capsule Take 1 g by mouth 2 (two) times daily.      Marland Kitchen lisinopril (PRINIVIL,ZESTRIL) 20 MG tablet Take 1 tablet (20 mg total) by mouth daily. 90 tablet 3  . metoprolol succinate (TOPROL-XL) 50 MG 24 hr tablet Take 1 tablet (50 mg total) by mouth daily. 90 tablet 0  . nitroGLYCERIN (NITROSTAT) 0.4 MG SL tablet Place 1 tablet (0.4 mg total) under the tongue as needed. 25 tablet 3  . simvastatin (ZOCOR) 20 MG tablet Take 1 tablet (20 mg total) by mouth at bedtime. 90 tablet 0   No current facility-administered medications on file prior to visit.     Allergies  Allergen Reactions  . Iodinated Diagnostic Agents    Vitals today: BP 148/78  87 - he reports this is high for him and his pressures always run higher at the doctors office.   Assessment/Plan: Hypertension: BP is just below goal. Doxazosin is frequently associated with  the symptoms the patient has been experiencing. Will discontinue doxazosin. Will change his lisinopril to valsartan to see if this will help with his dry cough. Started low dose of valsartan since this tends to be more potent on BP than lisinopril and his has been losing weight. He may require medication taper if BP continues to respond to weight loss. Have asked that he monitor BP twice daily and record readings and bring them to follow up in 2-3 weeks.   Thank you, Lelan Pons. Patterson Hammersmith, [place]

## 2016-02-25 ENCOUNTER — Encounter: Payer: Self-pay | Admitting: Pharmacist

## 2016-02-25 NOTE — Assessment & Plan Note (Signed)
BP is just below goal. Doxazosin is frequently associated with the symptoms the patient has been experiencing. Will discontinue doxazosin. Will change his lisinopril to valsartan to see if this will help with his dry cough. Started low dose of valsartan since this tends to be more potent on BP than lisinopril and his has been losing weight. He may require medication taper if BP continues to respond to weight loss. Have asked that he monitor BP twice daily and record readings and bring them to follow up in 2-3 weeks.

## 2016-03-01 DIAGNOSIS — J45909 Unspecified asthma, uncomplicated: Secondary | ICD-10-CM | POA: Diagnosis not present

## 2016-03-09 ENCOUNTER — Encounter (INDEPENDENT_AMBULATORY_CARE_PROVIDER_SITE_OTHER): Payer: Self-pay

## 2016-03-09 ENCOUNTER — Ambulatory Visit (INDEPENDENT_AMBULATORY_CARE_PROVIDER_SITE_OTHER): Payer: Medicare Other | Admitting: Pharmacist

## 2016-03-09 ENCOUNTER — Encounter: Payer: Self-pay | Admitting: Pharmacist

## 2016-03-09 VITALS — BP 144/92 | Wt 177.8 lb

## 2016-03-09 DIAGNOSIS — I10 Essential (primary) hypertension: Secondary | ICD-10-CM | POA: Diagnosis not present

## 2016-03-09 LAB — BASIC METABOLIC PANEL
BUN: 12 mg/dL (ref 7–25)
CALCIUM: 9 mg/dL (ref 8.6–10.3)
CO2: 28 mmol/L (ref 20–31)
Chloride: 99 mmol/L (ref 98–110)
Creat: 0.86 mg/dL (ref 0.70–1.18)
Glucose, Bld: 121 mg/dL — ABNORMAL HIGH (ref 65–99)
POTASSIUM: 4.8 mmol/L (ref 3.5–5.3)
SODIUM: 140 mmol/L (ref 135–146)

## 2016-03-09 MED ORDER — VALSARTAN 160 MG PO TABS: 160.0000 mg | ORAL_TABLET | Freq: Every day | ORAL | 1 refills | Status: DC

## 2016-03-09 NOTE — Patient Instructions (Addendum)
LABS today  Return for a a follow up appointment in 1 month  Check your blood pressure at home daily (if able) and keep record of the readings.  Take your BP meds as follows: Increase Valsartan to 160mg  daily (you may take 2 of your current supply then start taking 1 of the higher strength)  Bring all of your meds, your BP cuff and your record of home blood pressures to your next appointment.  Exercise as you're able, try to walk approximately 30 minutes per day.  Keep salt intake to a minimum, especially watch canned and prepared boxed foods.  Eat more fresh fruits and vegetables and fewer canned items.  Avoid eating in fast food restaurants.    HOW TO TAKE YOUR BLOOD PRESSURE: . Rest 5 minutes before taking your blood pressure. .  Don't smoke or drink caffeinated beverages for at least 30 minutes before. . Take your blood pressure before (not after) you eat. . Sit comfortably with your back supported and both feet on the floor (don't cross your legs). . Elevate your arm to heart level on a table or a desk. . Use the proper sized cuff. It should fit smoothly and snugly around your bare upper arm. There should be enough room to slip a fingertip under the cuff. The bottom edge of the cuff should be 1 inch above the crease of the elbow. . Ideally, take 3 measurements at one sitting and record the average.

## 2016-03-09 NOTE — Progress Notes (Signed)
Patient ID: Larry Briggs                 DOB: 12-13-1935                      MRN: [id number]     HPI: Larry Briggs is a 80 y.o. male patient of Dr. Martinique with [place] below who presents today for hypertension follow-up.  He has not seen Dr. Martinique in over a year but has made an appt for Nov which was first available.   He reports he is still sick with a cough and his primary care provider changed his doxycycline to Augmentin which he believes is helping.   He has had no periods of dizziness since discontinuing the lisinopril and doxazosin. He feels he is much improved.    Cardiac Hx: HTN, ASCVD, HLD, obesity  Current HTN meds:  Valsartan 160mg  daily Metoprolol XL 50mg  daily in the evening  Previously tried: lisinopril - cough Doxazosin - extreme dizziness  BP goal: <150/90  Family History: Mother passed of a hemorraghic stroke. His father passed from a MI. He has 2 sisters one of which recently passed from a stroke.   Diet: He endorses eating out occasionally. His wife reports that they use "light salt" at home. He admits he has not been great about keeping hydrated with the heat but he does not believe all of the low blood pressures are associated with dehyration.   Exercise: denies exercise  Home BP readings: all but one BP measurement >150 on his new omron arm cuff. All diastolic have been WNL.    Wt Readings from Last 3 Encounters:  03/09/16 177 lb 12.8 oz (80.6 kg)  02/24/16 179 lb 12.8 oz (81.6 kg)  10/24/14 201 lb 11.2 oz (91.5 kg)   BP Readings from Last 3 Encounters:  03/09/16 (!) 144/92  02/24/16 (!) 148/78  10/24/14 138/80   Pulse Readings from Last 3 Encounters:  02/24/16 87  10/24/14 67  08/06/13 80    Renal function: CrCl cannot be calculated (Unknown ideal weight.).  Past Medical History:  Diagnosis Date  . CAD (coronary artery disease)    Remote MI in 1980. CABG 1995. Stent to left main in 2004. Last cath in April of 2012. EF 50%; patent  LIMA to DX/LAD with collateralization of the distal right, patent SVG to OM, occluded SVG to distal RCA, and patent stent to LAD  . History of heart attack 1980  . Hypercholesteremia   . Hypertension   . Increased glucose level   . Obesity     Current Outpatient Prescriptions on File Prior to Visit  Medication Sig Dispense Refill  . Ascorbic Acid (VITAMIN C) 500 MG CAPS Take 1 capsule by mouth daily. ( Estra-C )     . aspirin 81 MG EC tablet Take 81 mg by mouth daily.     . fish oil-omega-3 fatty acids 1000 MG capsule Take 1 g by mouth daily.     . metoprolol succinate (TOPROL-XL) 50 MG 24 hr tablet Take 1 tablet (50 mg total) by mouth daily. 90 tablet 0  . multivitamin-iron-minerals-folic acid (CENTRUM) chewable tablet Chew 1 tablet by mouth daily.    . simvastatin (ZOCOR) 20 MG tablet Take 1 tablet (20 mg total) by mouth at bedtime. 90 tablet 0  . nitroGLYCERIN (NITROSTAT) 0.4 MG SL tablet Place 1 tablet (0.4 mg total) under the tongue as needed. (Patient not taking: Reported on 02/24/2016) 25 tablet  3   No current facility-administered medications on file prior to visit.     Allergies  Allergen Reactions  . Iodinated Diagnostic Agents     Blood pressure (!) 144/92, weight 177 lb 12.8 oz (80.6 kg).   Assessment/Plan: Hypertension: BP is borderline at goal today. Will increase his valsartan to 160mg  daily. Continue to monitor at home and call if experience any dizzy spells. Will get BMET today after medication changes. Follow up in hypertension clinic in 1 month.   Thank you, Lelan Pons. Patterson Hammersmith, [facility]  03/09/2016 9:47 AM

## 2016-03-31 ENCOUNTER — Other Ambulatory Visit: Payer: Self-pay

## 2016-04-02 DIAGNOSIS — J441 Chronic obstructive pulmonary disease with (acute) exacerbation: Secondary | ICD-10-CM | POA: Diagnosis not present

## 2016-04-06 ENCOUNTER — Encounter: Payer: Self-pay | Admitting: Pharmacist

## 2016-04-06 ENCOUNTER — Ambulatory Visit (INDEPENDENT_AMBULATORY_CARE_PROVIDER_SITE_OTHER): Payer: Medicare Other | Admitting: Pharmacist

## 2016-04-06 VITALS — BP 162/92 | HR 45

## 2016-04-06 DIAGNOSIS — I10 Essential (primary) hypertension: Secondary | ICD-10-CM

## 2016-04-06 NOTE — Progress Notes (Signed)
Patient ID: Larry Briggs                 DOB: 1936-03-15                      MRN: [id number]     HPI: Larry Briggs is a 80 y.o. male patient of Dr. Martinique with [place] below who presents today for hypertension follow up. He initially presented to hypertension clinic for hypotensive symptoms. Since then his lisinopril was changed to valsartan. At his most recent visit his valsartan was increased to 160mg .   Since our last visit he has again been seen by primary for lung issues. He has had a persistent cough and chest congestion. He was prescribed prednisone and albuterol to help with symptoms until he is able to see pulmonology.   He denies any hypotensive symptoms since our last visit.   Pt requested that vaccination be updated in system. These have been updated. All vaccinations given by primary care [facility].   Cardiac Hx: HTN, ASCVD, HLD, obesity  Current HTN meds:  Valsartan 160mg  daily Metoprolol XL 50mg  daily in the evening  Previously tried: lisinopril - cough Doxazosin - extreme dizziness  BP goal: <150/90  Family History: Mother passed of a hemorraghic stroke. His father passed from a MI. He has 2 sisters one of which recently passed from a stroke.   Diet:He endorses eating out occasionally. His wife reports that they use "light salt" at home. He admits he has not been great about keeping hydrated with the heat but he does not believe all of the low blood pressures are associated with dehyration.   Exercise:denies exercise  Home BP readings: His pressures have avg 160s/80s since starting prednisone. He reports at his most recent doctors visit his pressure was 120/70. HR avg in 70s.   Wt Readings from Last 3 Encounters:  03/09/16 177 lb 12.8 oz (80.6 kg)  02/24/16 179 lb 12.8 oz (81.6 kg)  10/24/14 201 lb 11.2 oz (91.5 kg)   BP Readings from Last 3 Encounters:  04/06/16 (!) 162/92  03/09/16 (!) 144/92  02/24/16 (!) 148/78   Pulse  Readings from Last 3 Encounters:  04/06/16 (!) 45  02/24/16 87  10/24/14 67    Renal function: CrCl cannot be calculated (Unknown ideal weight.).  Past Medical History:  Diagnosis Date  . CAD (coronary artery disease)    Remote MI in 1980. CABG 1995. Stent to left main in 2004. Last cath in April of 2012. EF 50%; patent LIMA to DX/LAD with collateralization of the distal right, patent SVG to OM, occluded SVG to distal RCA, and patent stent to LAD  . History of heart attack 1980  . Hypercholesteremia   . Hypertension   . Increased glucose level   . Obesity     Current Outpatient Prescriptions on File Prior to Visit  Medication Sig Dispense Refill  . Ascorbic Acid (VITAMIN C) 500 MG CAPS Take 1 capsule by mouth daily. ( Estra-C )     . aspirin 81 MG EC tablet Take 81 mg by mouth daily.     . fish oil-omega-3 fatty acids 1000 MG capsule Take 1 g by mouth daily.     . metoprolol succinate (TOPROL-XL) 50 MG 24 hr tablet Take 1 tablet (50 mg total) by mouth daily. 90 tablet 0  . multivitamin-iron-minerals-folic acid (CENTRUM) chewable tablet Chew 1 tablet by mouth daily.    . simvastatin (ZOCOR) 20 MG  tablet Take 1 tablet (20 mg total) by mouth at bedtime. 90 tablet 0  . valsartan (DIOVAN) 160 MG tablet Take 1 tablet (160 mg total) by mouth daily. 30 tablet 1  . nitroGLYCERIN (NITROSTAT) 0.4 MG SL tablet Place 1 tablet (0.4 mg total) under the tongue as needed. (Patient not taking: Reported on 04/06/2016) 25 tablet 3   No current facility-administered medications on file prior to visit.     Allergies  Allergen Reactions  . Iodinated Diagnostic Agents     Blood pressure (!) 162/92, pulse (!) 45.   Assessment/Plan: Hypertension: BP is above goal again today. Given his recent symptoms of hypotension and he is on medications that can elevate his pressures (prednisone and albuterol) will defer additional increase in valsartan at this time. He will follow up with Dr. Martinique as scheduled  and then again with hypertension if needed after that appointment.    Thank you, Lelan Pons. Patterson Hammersmith, [facility]  04/08/2016 1:10 PM

## 2016-04-06 NOTE — Patient Instructions (Signed)
Return for a a follow up appointment with Dr Martinique   Check your blood pressure at home daily (if able) and keep record of the readings.   Bring all of your meds, your BP cuff and your record of home blood pressures to your next appointment.  Exercise as you're able, try to walk approximately 30 minutes per day.  Keep salt intake to a minimum, especially watch canned and prepared boxed foods.  Eat more fresh fruits and vegetables and fewer canned items.  Avoid eating in fast food restaurants.    HOW TO TAKE YOUR BLOOD PRESSURE: . Rest 5 minutes before taking your blood pressure. .  Don't smoke or drink caffeinated beverages for at least 30 minutes before. . Take your blood pressure before (not after) you eat. . Sit comfortably with your back supported and both feet on the floor (don't cross your legs). . Elevate your arm to heart level on a table or a desk. . Use the proper sized cuff. It should fit smoothly and snugly around your bare upper arm. There should be enough room to slip a fingertip under the cuff. The bottom edge of the cuff should be 1 inch above the crease of the elbow. . Ideally, take 3 measurements at one sitting and record the average.

## 2016-04-16 DIAGNOSIS — Z23 Encounter for immunization: Secondary | ICD-10-CM | POA: Diagnosis not present

## 2016-04-22 ENCOUNTER — Encounter: Payer: Self-pay | Admitting: Pulmonary Disease

## 2016-04-22 ENCOUNTER — Ambulatory Visit (INDEPENDENT_AMBULATORY_CARE_PROVIDER_SITE_OTHER): Payer: Medicare Other | Admitting: Pulmonary Disease

## 2016-04-22 VITALS — BP 126/84 | HR 82 | Ht 71.0 in | Wt 179.6 lb

## 2016-04-22 DIAGNOSIS — R05 Cough: Secondary | ICD-10-CM | POA: Diagnosis not present

## 2016-04-22 DIAGNOSIS — J449 Chronic obstructive pulmonary disease, unspecified: Secondary | ICD-10-CM | POA: Insufficient documentation

## 2016-04-22 DIAGNOSIS — R059 Cough, unspecified: Secondary | ICD-10-CM

## 2016-04-22 DIAGNOSIS — J430 Unilateral pulmonary emphysema [MacLeod's syndrome]: Secondary | ICD-10-CM | POA: Diagnosis not present

## 2016-04-22 MED ORDER — BUDESONIDE-FORMOTEROL FUMARATE 160-4.5 MCG/ACT IN AERO: 2.0000 | INHALATION_SPRAY | Freq: Two times a day (BID) | RESPIRATORY_TRACT | 0 refills | Status: DC

## 2016-04-22 MED ORDER — BUDESONIDE-FORMOTEROL FUMARATE 160-4.5 MCG/ACT IN AERO: 2.0000 | INHALATION_SPRAY | Freq: Two times a day (BID) | RESPIRATORY_TRACT | 6 refills | Status: DC

## 2016-04-22 NOTE — Progress Notes (Signed)
Patient seen in the office today and instructed on use of Symbicort.  Patient expressed understanding and demonstrated technique. ° °

## 2016-04-22 NOTE — Assessment & Plan Note (Signed)
Patient is here for cough/COPD.  Patient has a 40PY smoking history, quit in 1999, not been diagnosed with asthma or copd. Not on O2.   Cough is likely post viral cough, unmasking most likely COPD.  Cough significantly better with prednisone. Some recent SOB related to recent infection.   Pt has chronic sinus issues which did NOT get worse or flared up during the time that he had viral URI.   Plan : 1. Therapeutic trial with Symbicort, 160/4.5, 2 puffs twice a day. After 2 weeks of using the sample, patient will give [place] a call if he feels worse off the Symbicort. If he does, he needs to be on maintenance Symbicort. Symbicort will hopefully help with the cough as well. 2. Hold off on prednisone for now. 3. Patient has chronic sinus issues which have been stable. 4. No symptoms of GERD.

## 2016-04-22 NOTE — Assessment & Plan Note (Signed)
patient has a 40PY smoking history, quit in 1999, not been diagnosed with asthma or copd. Not on O2.   Pt has heart issues for which he sees Cardiology. He had CABG, arrhythmias. Sees Cards yearly.   Recent exertional SOB and cough in July, post viral.   Plan:  1. Therapeutic trial with Symbicort, 160/4.5, 2 puffs twice a day. We will give him a sample. If he feels worse off Symbicort, plan is to keep him on Symbicort twice a day. They will let us know. 2. Ventolin 2P q4 hrs prn 3. Needs PFT 4. Has received influenza vaccine. Up-to-date with pneumonia vaccines.

## 2016-04-22 NOTE — Progress Notes (Signed)
Subjective:    Patient ID: Larry Briggs, male    DOB: March 08, 1936, 80 y.o.   MRN: [id number]  HPI   This is the case of Larry Briggs, 80 y.o. Male, who was referred by Dr. [name]  in consultation regarding COPD.   As you very well know, patient has a 40PY smoking history, quit in 1999, not been diagnosed with asthma or copd. Not on O2.   Pt has heart issues for which he sees Cardiology. He had CABG, arrhythmias. Sees Cards yearly.   2 months ago, pt had a viral infection with cough, cough congestion, SOB, wheezing. He was sick with the wife. Eventually had abx > doxycycline and augmentin for 2 weeks.  Sx improved except for the cough.  He was tried on Symbicort but he did not get better after 1-2 weeks. He was then placed on Ventolin and Prednisone at the start of the month. He has been off Prednisone x 1 week.  He uses Ventolin 2P QID and he is significantly improved. Overall, his cough is better and most likely since taking the prednisone.  He has chronic sinus issues which is not worse.   Pt had a CXR 02/23/16 > cardiomegaly. (-) infiltrate.   Review of Systems  Constitutional: Negative.  Negative for fever and unexpected weight change.  HENT: Positive for congestion, postnasal drip and rhinorrhea. Negative for dental problem, ear pain, nosebleeds, sinus pressure, sneezing, sore throat and trouble swallowing.   Eyes: Negative.  Negative for redness and itching.  Respiratory: Positive for cough, shortness of breath and wheezing. Negative for chest tightness.   Cardiovascular: Positive for leg swelling. Negative for palpitations.  Gastrointestinal: Negative.  Negative for nausea and vomiting.  Genitourinary: Negative.  Negative for dysuria.  Musculoskeletal: Negative.  Negative for joint swelling.  Skin: Negative.  Negative for rash.  Allergic/Immunologic: Positive for environmental allergies.  Neurological: Negative.  Negative for headaches.  Hematological:  Bruises/bleeds easily.  Psychiatric/Behavioral: Negative.  Negative for dysphoric mood. The patient is not nervous/anxious.    Past Medical History:  Diagnosis Date  . CAD (coronary artery disease)    Remote MI in 1980. CABG 1995. Stent to left main in 2004. Last cath in April of 2012. EF 50%; patent LIMA to DX/LAD with collateralization of the distal right, patent SVG to OM, occluded SVG to distal RCA, and patent stent to LAD  . History of heart attack 1980  . Hypercholesteremia   . Hypertension   . Increased glucose level   . Obesity    (-) CA, DVT  Family History  Problem Relation Age of Onset  . Heart attack Father   . Hypertension Father   . Stroke Mother      Past Surgical History:  Procedure Laterality Date  . CARDIAC CATHETERIZATION  1993  . CARDIAC CATHETERIZATION  April 2012   Mild reduction if EF at 50%. Patent LIMA to DX/LAD with collateralization to distal RCA, patent SVG to OM and occluded SVG to distal right and patent stent to LAD/Left main;  . CORONARY ARTERY BYPASS GRAFT  05/1994   x5 LIMA to LAD & DX, SVG to LCX, SVG to RCA  . CORONARY STENT PLACEMENT  2004   Stent to the L main    Social History   Social History  . Marital status: Married    Spouse name: N/A  . Number of children: N/A  . Years of education: N/A   Occupational History  . Not on file.  Social History Main Topics  . Smoking status: Former Smoker    Packs/day: 1.00    Years: 40.00    Types: Cigarettes    Quit date: 07/02/1994  . Smokeless tobacco: Not on file  . Alcohol use No  . Drug use: No  . Sexual activity: Yes   Other Topics Concern  . Not on file   Social History Narrative  . No narrative on file   Lives in [place]. Was a Engineer, structural. (-) ETOH.   Allergies  Allergen Reactions  . Iodinated Diagnostic Agents      Outpatient Medications Prior to Visit  Medication Sig Dispense Refill  . Ascorbic Acid (VITAMIN C) 500 MG CAPS Take 1 capsule by mouth daily. (  Estra-C )     . aspirin 81 MG EC tablet Take 81 mg by mouth daily.     . fish oil-omega-3 fatty acids 1000 MG capsule Take 1 g by mouth daily.     . metoprolol succinate (TOPROL-XL) 50 MG 24 hr tablet Take 1 tablet (50 mg total) by mouth daily. 90 tablet 0  . multivitamin-iron-minerals-folic acid (CENTRUM) chewable tablet Chew 1 tablet by mouth daily.    . simvastatin (ZOCOR) 20 MG tablet Take 1 tablet (20 mg total) by mouth at bedtime. 90 tablet 0  . valsartan (DIOVAN) 160 MG tablet Take 1 tablet (160 mg total) by mouth daily. 30 tablet 1  . nitroGLYCERIN (NITROSTAT) 0.4 MG SL tablet Place 1 tablet (0.4 mg total) under the tongue as needed. (Patient not taking: Reported on 04/22/2016) 25 tablet 3   No facility-administered medications prior to visit.    Meds ordered this encounter  Medications  . VENTOLIN HFA 108 (90 Base) MCG/ACT inhaler  . budesonide-formoterol (SYMBICORT) 160-4.5 MCG/ACT inhaler    Sig: Inhale 2 puffs into the lungs 2 (two) times daily.    Dispense:  1 Inhaler    Refill:  6  . budesonide-formoterol (SYMBICORT) 160-4.5 MCG/ACT inhaler    Sig: Inhale 2 puffs into the lungs 2 (two) times daily.    Dispense:  1 Inhaler    Refill:  0    Order Specific Question:   Lot Number?    Answer:   [id number]    Order Specific Question:   Expiration Date?    Answer:   06/01/2017         Objective:   Physical Exam  Vitals:  Vitals:   04/22/16 1348  BP: 126/84  Pulse: 82  SpO2: 94%  Weight: 179 lb 9.6 oz (81.5 kg)  Height: 5\' 11"  (1.803 m)    Constitutional/General:  Pleasant, well-nourished, well-developed, not in any distress,  Comfortably seating.  Well kempt  Body mass index is 25.05 kg/m. Wt Readings from Last 3 Encounters:  04/22/16 179 lb 9.6 oz (81.5 kg)  03/09/16 177 lb 12.8 oz (80.6 kg)  02/24/16 179 lb 12.8 oz (81.6 kg)    Neck circumference:   HEENT: Pupils equal and reactive to light and accommodation. Anicteric sclerae. Normal nasal mucosa.     No oral  lesions,  mouth clear,  oropharynx clear, no postnasal drip. (-) Oral thrush. No dental caries.  Airway - Mallampati class III  Neck: No masses. Midline trachea. No JVD, (-) LAD. (-) bruits appreciated.  Respiratory/Chest: Grossly normal chest. (-) deformity. (-) Accessory muscle use.  Symmetric expansion. (-) Tenderness on palpation.  Resonant on percussion.  Diminished BS on both lower lung zones. (-) wheezing, crackles, rhonchi (-) egophony  Cardiovascular:  Regular rate and  rhythm, heart sounds normal, no murmur or gallops, no peripheral edema  Gastrointestinal:  Normal bowel sounds. Soft, non-tender. No hepatosplenomegaly.  (-) masses.   Musculoskeletal:  Normal muscle tone. Normal gait.   Extremities: Grossly normal. (-) clubbing, cyanosis.  (-) edema  Skin: (-) rash,lesions seen.   Neurological/Psychiatric : alert, oriented to time, place, person. Normal mood and affect          Assessment & Plan:  Cough Patient is here for cough/COPD.  Patient has a 40PY smoking history, quit in 1999, not been diagnosed with asthma or copd. Not on O2.   Cough is likely post viral cough, unmasking most likely COPD.  Cough significantly better with prednisone. Some recent SOB related to recent infection.   Pt has chronic sinus issues which did NOT get worse or flared up during the time that he had viral URI.   Plan : 1. Therapeutic trial with Symbicort, 160/4.5, 2 puffs twice a day. After 2 weeks of using the sample, patient will give [place] a call if he feels worse off the Symbicort. If he does, he needs to be on maintenance Symbicort. Symbicort will hopefully help with the cough as well. 2. Hold off on prednisone for now. 3. Patient has chronic sinus issues which have been stable. 4. No symptoms of GERD.   COPD (chronic obstructive pulmonary disease) ([place]) patient has a 40PY smoking history, quit in 1999, not been diagnosed with asthma or copd. Not on O2.   Pt has  heart issues for which he sees Cardiology. He had CABG, arrhythmias. Sees Cards yearly.   Recent exertional SOB and cough in July, post viral.   Plan:  1. Therapeutic trial with Symbicort, 160/4.5, 2 puffs twice a day. We will give him a sample. If he feels worse off Symbicort, plan is to keep him on Symbicort twice a day. They will let us know. 2. Ventolin 2P q4 hrs prn 3. Needs PFT 4. Has received influenza vaccine. Up-to-date with pneumonia vaccines.   Thank you very much for letting me participate in this patient's care. Please do not hesitate to give me a call if you have any questions or concerns regarding the treatment plan.   Patient will follow up with me in 3 months.     Monica Becton, MD 04/22/2016   2:38 PM Pulmonary and [place] Pager: [phone] Office: [phone], Fax: [phone]

## 2016-04-22 NOTE — Patient Instructions (Signed)
It was a pleasure taking care of you today!  You most likely have Chronic Obstructive Pulmonary Disease or COPD.  COPD is a preventable and treatable disease that makes it difficult to empty air out of the lungs (airflow obstruction).  This can lead to shortness of breath.   Sometimes, when you have a lung infection, this can make your breathing worse, and will cause you to have a COPD flare-up or an acute exacerbation of COPD. Please call your primary care doctor or the office if you are having a COPD flare-up.   Smoking makes COPD worse.   Make sure you use your medications for COPD -- Maintenance medications : Symbicort, 160/4.5, 2 puffs twice a day.  Rescue medications: Albuterol 2 puffs every 4 hours as needed for shortness of breath.   Please rinse your mouth each time you use your maintenance medication.  Please call the office if you are having issues with your medications  Return to clinic in 3 months.

## 2016-04-26 ENCOUNTER — Telehealth: Payer: Self-pay | Admitting: Pulmonary Disease

## 2016-04-26 ENCOUNTER — Encounter (INDEPENDENT_AMBULATORY_CARE_PROVIDER_SITE_OTHER): Payer: Medicare Other | Admitting: Pulmonary Disease

## 2016-04-26 DIAGNOSIS — J449 Chronic obstructive pulmonary disease, unspecified: Secondary | ICD-10-CM | POA: Diagnosis not present

## 2016-04-26 LAB — PULMONARY FUNCTION TEST
DL/VA % PRED: 53 %
DL/VA: 2.47 ml/min/mmHg/L
DLCO COR % PRED: 44 %
DLCO COR: 14.48 ml/min/mmHg
DLCO UNC % PRED: 41 %
DLCO UNC: 13.54 ml/min/mmHg
FEF 25-75 POST: 0.45 L/s
FEF 25-75 PRE: 0.38 L/s
FEF2575-%CHANGE-POST: 19 %
FEF2575-%PRED-POST: 21 %
FEF2575-%PRED-PRE: 18 %
FEV1-%CHANGE-POST: 1 %
FEV1-%PRED-POST: 30 %
FEV1-%Pred-Pre: 30 %
FEV1-PRE: 0.89 L
FEV1-Post: 0.9 L
FEV1FVC-%CHANGE-POST: -7 %
FEV1FVC-%PRED-PRE: 55 %
FEV6-%CHANGE-POST: 9 %
FEV6-%PRED-POST: 59 %
FEV6-%Pred-Pre: 54 %
FEV6-Post: 2.31 L
FEV6-Pre: 2.12 L
FEV6FVC-%CHANGE-POST: 0 %
FEV6FVC-%Pred-Post: 103 %
FEV6FVC-%Pred-Pre: 103 %
FVC-%Change-Post: 9 %
FVC-%Pred-Post: 58 %
FVC-%Pred-Pre: 53 %
FVC-PRE: 2.2 L
FVC-Post: 2.41 L
POST FEV1/FVC RATIO: 37 %
PRE FEV1/FVC RATIO: 40 %
Post FEV6/FVC ratio: 96 %
Pre FEV6/FVC Ratio: 96 %
RV % PRED: 191 %
RV: 4.99 L
TLC % pred: 116 %
TLC: 8.2 L

## 2016-04-26 NOTE — Telephone Encounter (Signed)
Disc has been placed in AD's look at. This will need to go out to medical records. Will route message to [facility] for follow up.

## 2016-04-26 NOTE — Telephone Encounter (Signed)
CD has been placed in scan folder and will be sent to HIM this week. Nothing further needed.

## 2016-05-03 ENCOUNTER — Other Ambulatory Visit: Payer: Self-pay | Admitting: Cardiology

## 2016-05-03 NOTE — Telephone Encounter (Signed)
°*  STAT* If patient is at the pharmacy, call can be transferred to refill team.   1. Which medications need to be refilled? (please list name of each medication and dose if known) metoprolol and simvastin  2. Which pharmacy/location (including street and city if local pharmacy) is medication to be sent to?[facility]  3. Do they need a 30 day or 90 day supply? 90 days supply and refills-pt has an appointment in November

## 2016-05-04 MED ORDER — METOPROLOL SUCCINATE ER 50 MG PO TB24: 50.0000 mg | ORAL_TABLET | Freq: Every day | ORAL | 0 refills | Status: DC

## 2016-05-04 MED ORDER — SIMVASTATIN 20 MG PO TABS: 20.0000 mg | ORAL_TABLET | Freq: Every day | ORAL | 0 refills | Status: DC

## 2016-05-10 ENCOUNTER — Telehealth: Payer: Self-pay | Admitting: Pulmonary Disease

## 2016-05-10 NOTE — Telephone Encounter (Signed)
Pulmonary Function Test  Order: [id number]  Status:  Edited Result - FINAL Visible to patient:  Yes (MyChart) Next appt:  06/18/2016 at 09:00 AM in Cardiology (Peter Martinique, MD) Dx:  Chronic obstructive pulmonary disease...  Notes Recorded by Beckie Busing, CMA on 05/06/2016 at 9:30 AM EDT Attempted to contact patient regarding results. Left message on voicemail for patient to return call. ------  Notes Recorded by Rush Landmark, MD on 05/06/2016 at 12:07 AM EDT pls tell pt he has severe copd. He is already on symbicort which should help him. Thanks.   Pt aware of PFT results. Pt voiced understanding and had no further questions. Nothing further needed.

## 2016-06-03 ENCOUNTER — Encounter: Payer: Self-pay | Admitting: Cardiology

## 2016-06-10 ENCOUNTER — Telehealth: Payer: Self-pay | Admitting: Cardiology

## 2016-06-10 MED ORDER — VALSARTAN 160 MG PO TABS: 160.0000 mg | ORAL_TABLET | Freq: Every day | ORAL | 1 refills | Status: DC

## 2016-06-10 NOTE — Telephone Encounter (Signed)
New message    *STAT* If patient is at the pharmacy, call can be transferred to refill team.   1. Which medications need to be refilled? (please list name of each medication and dose if known) valsartan (DIOVAN) 160 MG tablet  2. Which pharmacy/location (including street and city if local pharmacy) is medication to be sent to?[facility] on [address]   3. Do they need a 30 day or 90 day supply? 30 days supply

## 2016-06-10 NOTE — Telephone Encounter (Signed)
RX sent

## 2016-06-16 NOTE — Progress Notes (Signed)
[name] Date of Birth: [birth year] Medical Record [id number]  History of Present Illness: Larry Briggs is seen back today for follow up CAD. He has a known history of coronary disease and status post coronary bypass surgery in 1995. He underwent stenting of the left main coronary in 2004. His last cardiac catheterization in April 2012 showed that everything was patent except for a vein graft to the distal right coronary. The PDA was supplied by left to right collaterals. He has been followed in our HTN clinic. He was having side effects on medications. This improved with stopping lisinopril and doxazosin and starting on valsartan. He does have severe COPD. On follow up today he is doing well.  He denies any chest pain, SOB, or increase in edema. He hasn't been walking regularly 1.5 miles on his treadmill. He has lost weight.No chest pain. SOB has improved significantly on inhalers. No palpitations. BP at home has continued to run high in the 165-180 range.   Current Outpatient Prescriptions on File Prior to Visit  Medication Sig Dispense Refill  . Ascorbic Acid (VITAMIN C) 500 MG CAPS Take 1 capsule by mouth daily. ( Estra-C )     . aspirin 81 MG EC tablet Take 81 mg by mouth daily.     . budesonide-formoterol (SYMBICORT) 160-4.5 MCG/ACT inhaler Inhale 2 puffs into the lungs 2 (two) times daily. 1 Inhaler 6  . fish oil-omega-3 fatty acids 1000 MG capsule Take 1 g by mouth daily.     . metoprolol succinate (TOPROL-XL) 50 MG 24 hr tablet Take 1 tablet (50 mg total) by mouth daily. 90 tablet 0  . multivitamin-iron-minerals-folic acid (CENTRUM) chewable tablet Chew 1 tablet by mouth daily.    . nitroGLYCERIN (NITROSTAT) 0.4 MG SL tablet Place 1 tablet (0.4 mg total) under the tongue as needed. 25 tablet 3  . simvastatin (ZOCOR) 20 MG tablet Take 1 tablet (20 mg total) by mouth at bedtime. 90 tablet 0  . VENTOLIN HFA 108 (90 Base) MCG/ACT inhaler      No current facility-administered medications  on file prior to visit.     Allergies  Allergen Reactions  . Iodinated Diagnostic Agents     Past Medical History:  Diagnosis Date  . CAD (coronary artery disease)    Remote MI in 1980. CABG 1995. Stent to left main in 2004. Last cath in April of 2012. EF 50%; patent LIMA to DX/LAD with collateralization of the distal right, patent SVG to OM, occluded SVG to distal RCA, and patent stent to LAD  . History of heart attack 1980  . Hypercholesteremia   . Hypertension   . Increased glucose level   . Obesity     Past Surgical History:  Procedure Laterality Date  . CARDIAC CATHETERIZATION  1993  . CARDIAC CATHETERIZATION  April 2012   Mild reduction if EF at 50%. Patent LIMA to DX/LAD with collateralization to distal RCA, patent SVG to OM and occluded SVG to distal right and patent stent to LAD/Left main;  . CORONARY ARTERY BYPASS GRAFT  05/1994   x5 LIMA to LAD & DX, SVG to LCX, SVG to RCA  . CORONARY STENT PLACEMENT  2004   Stent to the L main    History  Smoking Status  . Former Smoker  . Packs/day: 1.00  . Years: 40.00  . Types: Cigarettes  . Quit date: 07/02/1994  Smokeless Tobacco  . Never Used    History  Alcohol Use No  Family History  Problem Relation Age of Onset  . Heart attack Father   . Hypertension Father   . Stroke Mother     Review of Systems: The review of systems is per the HPI.  All other systems were reviewed and are negative.  Physical Exam: BP (!) 164/83   Pulse 78   Ht 5\' 11"  (1.803 m)   Wt 182 lb 6.4 oz (82.7 kg)   BMI 25.44 kg/m  I repeated BP in both arms with similar results. Patient is very pleasant, obese WM and in no acute distress. Skin is warm and dry. Color is normal.  HEENT is unremarkable. Normocephalic/atraumatic. PERRL. Sclera are nonicteric. Neck is supple. No masses. No JVD. Lungs are clear. Cardiac exam shows a regular rate and rhythm. Normal S1-2. No gallop or murmur. Abdomen is soft. Extremities are without edema. Gait  and ROM are intact. No gross neurologic deficits noted.   LABORATORY DATA:   Lab Results  Component Value Date   WBC 6.0 11/14/2010   HGB 14.3 11/14/2010   HCT 40.6 11/14/2010   PLT 209 11/14/2010   GLUCOSE 121 (H) 03/09/2016   CHOL  11/14/2010    112        ATP III CLASSIFICATION:  <200     mg/dL   Desirable  200-239  mg/dL   Borderline High  >=240    mg/dL   High          TRIG 108 11/14/2010   HDL 40 11/14/2010   LDLCALC  11/14/2010    50        Total Cholesterol/HDL:CHD Risk Coronary Heart Disease Risk Table                     Men   Women  1/2 Average Risk   3.4   3.3  Average Risk       5.0   4.4  2 X Average Risk   9.6   7.1  3 X Average Risk  23.4   11.0        Use the calculated Patient Ratio above and the CHD Risk Table to determine the patient's CHD Risk.        ATP III CLASSIFICATION (LDL):  <100     mg/dL   Optimal  100-129  mg/dL   Near or Above                    Optimal  130-159  mg/dL   Borderline  160-189  mg/dL   High  >190     mg/dL   Very High   NA 140 03/09/2016   K 4.8 03/09/2016   CL 99 03/09/2016   CREATININE 0.86 03/09/2016   BUN 12 03/09/2016   CO2 28 03/09/2016   INR 1.05 11/13/2010   Labs reviewed from 12/10/15: cholesterol 149, triglycerides 74, LDL 65, HDL 60. CMET normal.  Ecg: today NSR with sinus arrhythmia rate 72 bpm, otherwise normal. I have personally reviewed and interpreted this study.    Assessment / Plan: 1. CAD s/p CABG 1995. S/p stenting of the left main coronary artery 2004.  Cath 2012 showed occlusion of SVG to diagonal. Asymptomatic. Continue current antianginal Rx.   2. HTN- poorly controlled. Will increase valsartan to 320 mg daily. If additional medication needed would avoid alpha blockers given orthostasis on doxazosin.   3. Hyperlipidemia. Labs reviewed from Dr. Shelia Media  and lipids are well controlled. Continue statin and fish oil.  Follow up in 6 months.

## 2016-06-18 ENCOUNTER — Ambulatory Visit (INDEPENDENT_AMBULATORY_CARE_PROVIDER_SITE_OTHER): Payer: Medicare Other | Admitting: Cardiology

## 2016-06-18 ENCOUNTER — Encounter: Payer: Self-pay | Admitting: Cardiology

## 2016-06-18 ENCOUNTER — Other Ambulatory Visit: Payer: Self-pay

## 2016-06-18 VITALS — BP 164/83 | HR 78 | Ht 71.0 in | Wt 182.4 lb

## 2016-06-18 DIAGNOSIS — I2510 Atherosclerotic heart disease of native coronary artery without angina pectoris: Secondary | ICD-10-CM

## 2016-06-18 DIAGNOSIS — E7800 Pure hypercholesterolemia, unspecified: Secondary | ICD-10-CM | POA: Diagnosis not present

## 2016-06-18 DIAGNOSIS — I10 Essential (primary) hypertension: Secondary | ICD-10-CM

## 2016-06-18 MED ORDER — SIMVASTATIN 20 MG PO TABS: 20.0000 mg | ORAL_TABLET | Freq: Every day | ORAL | 3 refills | Status: DC

## 2016-06-18 MED ORDER — VALSARTAN 320 MG PO TABS: 320.0000 mg | ORAL_TABLET | Freq: Every day | ORAL | 3 refills | Status: DC

## 2016-06-18 MED ORDER — METOPROLOL SUCCINATE ER 50 MG PO TB24: 50.0000 mg | ORAL_TABLET | Freq: Every day | ORAL | 3 refills | Status: DC

## 2016-06-18 NOTE — Patient Instructions (Signed)
Increase valsartan to 320 mg daily  Continue your other therapy  Let us know how your blood pressure is doing.   I will see you in 6 months

## 2016-07-05 DIAGNOSIS — H2513 Age-related nuclear cataract, bilateral: Secondary | ICD-10-CM | POA: Diagnosis not present

## 2016-07-05 DIAGNOSIS — Z0100 Encounter for examination of eyes and vision without abnormal findings: Secondary | ICD-10-CM | POA: Diagnosis not present

## 2016-07-08 ENCOUNTER — Telehealth: Payer: Self-pay | Admitting: Cardiology

## 2016-07-08 DIAGNOSIS — I10 Essential (primary) hypertension: Secondary | ICD-10-CM

## 2016-07-08 NOTE — Telephone Encounter (Signed)
Returned call to patient.He stated his B/P continues to be elevated ranging 180 to190/ 90,pulse 70 to 80.Advised Dr.Jordan out of office.I will speak to him 07/09/16 and call you back.

## 2016-07-08 NOTE — Telephone Encounter (Signed)
NEW MESSAGE    Pt wife verbalized that Dr.Jordan informed him that he should call to give him an update on the medications that was given to see if they are working for him and pt wants a call back today please

## 2016-07-09 MED ORDER — CHLORTHALIDONE 25 MG PO TABS: 25.0000 mg | ORAL_TABLET | Freq: Every day | ORAL | 3 refills | Status: DC

## 2016-07-09 NOTE — Telephone Encounter (Signed)
Returned call to patient.Dr.Jordan advised to start Chlorthalidone 25 mg daily.Bmet in 2 to 3 weeks.Lab order mailed.Scheduler will call back to schedule HTN clinic appointment.

## 2016-07-10 ENCOUNTER — Telehealth: Payer: Self-pay | Admitting: Physician Assistant

## 2016-07-10 NOTE — Telephone Encounter (Signed)
Mr Krysinski was concerned because his BP was high today. He started chlorthalidone yesterday and noticed increase urination, but no other problems.   Today, his BP was higher than usual for him, 206/119 with HR 71. He is not having symptoms but is concerned.  Advised him his BP does need to be lower.  He takes the Diovan and metoprolol QHS, took them last night. Has had the chlorthalidone today.  Advised him to increase the metoprolol to 25 mg am and 50 mg pm. Ok to increase to 50 mg bid if HR tolerates.   F/u with pharmacist next week for further medication titration.  Call 911 for sx or if his BP does not come down with rx.  Rosaria Ferries, Hershal Coria 07/10/2016 9:58 AM Beeper [phone]

## 2016-07-12 ENCOUNTER — Telehealth: Payer: Self-pay | Admitting: Pharmacist Clinician (PhC)/ Clinical Pharmacy Specialist

## 2016-07-12 NOTE — Telephone Encounter (Signed)
Spoke with patient about re-scheduling his appt from Jan 4 to later this week.  He states BP this am was 128/68 and has improved greatly.  He chooses to keep his appt for January 4 at this time, but will call again should it spike upward.

## 2016-07-12 NOTE — Telephone Encounter (Signed)
-----   Message from Lonn Georgia, PA-C sent at 07/10/2016  4:23 PM EST ----- Pt of Dr Martinique. He needs Pharmacist or PA/NP visit next week for BP med management.  Has appt later in December, but BP running so high, feel that this is not soon enough. Thanks, Suanne Marker

## 2016-07-22 DIAGNOSIS — Z125 Encounter for screening for malignant neoplasm of prostate: Secondary | ICD-10-CM | POA: Diagnosis not present

## 2016-07-22 DIAGNOSIS — I10 Essential (primary) hypertension: Secondary | ICD-10-CM | POA: Diagnosis not present

## 2016-07-22 DIAGNOSIS — Z0000 Encounter for general adult medical examination without abnormal findings: Secondary | ICD-10-CM | POA: Diagnosis not present

## 2016-07-22 DIAGNOSIS — E7800 Pure hypercholesterolemia, unspecified: Secondary | ICD-10-CM | POA: Diagnosis not present

## 2016-07-22 DIAGNOSIS — Z7982 Long term (current) use of aspirin: Secondary | ICD-10-CM | POA: Diagnosis not present

## 2016-07-28 DIAGNOSIS — E119 Type 2 diabetes mellitus without complications: Secondary | ICD-10-CM | POA: Diagnosis not present

## 2016-07-28 DIAGNOSIS — Z87442 Personal history of urinary calculi: Secondary | ICD-10-CM | POA: Diagnosis not present

## 2016-07-28 DIAGNOSIS — E785 Hyperlipidemia, unspecified: Secondary | ICD-10-CM | POA: Diagnosis not present

## 2016-07-28 DIAGNOSIS — I10 Essential (primary) hypertension: Secondary | ICD-10-CM | POA: Diagnosis not present

## 2016-07-28 DIAGNOSIS — I259 Chronic ischemic heart disease, unspecified: Secondary | ICD-10-CM | POA: Diagnosis not present

## 2016-07-28 DIAGNOSIS — D72829 Elevated white blood cell count, unspecified: Secondary | ICD-10-CM | POA: Diagnosis not present

## 2016-07-29 ENCOUNTER — Encounter: Payer: Self-pay | Admitting: Pulmonary Disease

## 2016-07-29 ENCOUNTER — Ambulatory Visit (INDEPENDENT_AMBULATORY_CARE_PROVIDER_SITE_OTHER): Payer: Medicare Other | Admitting: Pulmonary Disease

## 2016-07-29 DIAGNOSIS — J430 Unilateral pulmonary emphysema [MacLeod's syndrome]: Secondary | ICD-10-CM

## 2016-07-29 DIAGNOSIS — I2510 Atherosclerotic heart disease of native coronary artery without angina pectoris: Secondary | ICD-10-CM | POA: Diagnosis not present

## 2016-07-29 DIAGNOSIS — R05 Cough: Secondary | ICD-10-CM

## 2016-07-29 DIAGNOSIS — R059 Cough, unspecified: Secondary | ICD-10-CM

## 2016-07-29 NOTE — Assessment & Plan Note (Signed)
Patient is here for cough/COPD.  Patient has a 40PY smoking history, quit in 1999. Pt with severe copd. Cough is resolved.  Improved with COPD meds and short course prednisone.  Patient has chronic sinus issues which have been stable. Denies reflux symptoms. We'll observe for now.

## 2016-07-29 NOTE — Progress Notes (Signed)
Subjective:    Patient ID: Larry Briggs, male    DOB: 12/02/1935, 80 y.o.   MRN: [id number]  HPI   This is the case of Larry Briggs, 80 y.o. Male, who was referred by Dr. [name]  in consultation regarding COPD.   As you very well know, patient has a 40PY smoking history, quit in 1999, not been diagnosed with asthma or copd. Not on O2.   Pt has heart issues for which he sees Cardiology. He had CABG, arrhythmias. Sees Cards yearly.   2 months ago, pt had a viral infection with cough, cough congestion, SOB, wheezing. He was sick with the wife. Eventually had abx > doxycycline and augmentin for 2 weeks.  Sx improved except for the cough.  He was tried on Symbicort but he did not get better after 1-2 weeks. He was then placed on Ventolin and Prednisone at the start of the month. He has been off Prednisone x 1 week.  He uses Ventolin 2P QID and he is significantly improved. Overall, his cough is better and most likely since taking the prednisone.  He has chronic sinus issues which is not worse.   Pt had a CXR 02/23/16 > cardiomegaly. (-) infiltrate.    ROV 07/29/16 Patient returns to the office as follow-up on his COPD and cough. Since last seen, he had PFTs in September which showed severe COPD with an FEV1 of 0.89 or 30%.  He is at baseline as far as breathing is concerned. Uses Symbicort daily. Feels better using it. He is able to do treadmill again. Has not been admitted nor been on antibiotics since last seen.   Review of Systems  Constitutional: Negative.  Negative for fever and unexpected weight change.  HENT: Negative for congestion, dental problem, ear pain, nosebleeds, postnasal drip, rhinorrhea, sinus pressure, sneezing, sore throat and trouble swallowing.   Eyes: Negative.  Negative for redness and itching.  Respiratory: Positive for shortness of breath. Negative for cough, chest tightness and wheezing.   Cardiovascular: Negative for palpitations and leg swelling.    Gastrointestinal: Negative.  Negative for nausea and vomiting.  Genitourinary: Negative.  Negative for dysuria.  Musculoskeletal: Negative.  Negative for joint swelling.  Skin: Negative.  Negative for rash.  Allergic/Immunologic: Positive for environmental allergies.  Neurological: Negative.  Negative for headaches.  Hematological: Bruises/bleeds easily.  Psychiatric/Behavioral: Negative.  Negative for dysphoric mood. The patient is not nervous/anxious.        Objective:   Physical Exam  Vitals:  Vitals:   07/29/16 0841  BP: 126/78  Pulse: 76  SpO2: 97%  Weight: 185 lb (83.9 kg)  Height: 5\' 9"  (1.753 m)    Constitutional/General:  Pleasant, well-nourished, well-developed, not in any distress,  Comfortably seating.  Well kempt  Body mass index is 27.32 kg/m. Wt Readings from Last 3 Encounters:  07/29/16 185 lb (83.9 kg)  06/18/16 182 lb 6.4 oz (82.7 kg)  04/22/16 179 lb 9.6 oz (81.5 kg)    HEENT: Pupils equal and reactive to light and accommodation. Anicteric sclerae. Normal nasal mucosa.   No oral  lesions,  mouth clear,  oropharynx clear, no postnasal drip. (-) Oral thrush. No dental caries.  Airway - Mallampati class III  Neck: No masses. Midline trachea. No JVD, (-) LAD. (-) bruits appreciated.  Respiratory/Chest: Grossly normal chest. (-) deformity. (-) Accessory muscle use.  Symmetric expansion. (-) Tenderness on palpation.  Resonant on percussion.  Diminished BS on both lower lung zones. (-)  wheezing, crackles, rhonchi (-) egophony  Cardiovascular: Regular rate and  rhythm, heart sounds normal, no murmur or gallops, no peripheral edema  Gastrointestinal:  Normal bowel sounds. Soft, non-tender. No hepatosplenomegaly.  (-) masses.   Musculoskeletal:  Normal muscle tone. Normal gait.   Extremities: Grossly normal. (-) clubbing, cyanosis.  (-) edema  Skin: (-) rash,lesions seen.   Neurological/Psychiatric : alert, oriented to time, place,  person. Normal mood and affect          Assessment & Plan:  COPD (chronic obstructive pulmonary disease) ([place]) Patient has a 40PY smoking history, quit in 1999. PFT (04/2016)  FEV1  0.89  30%.  CXR (01/2016)  Cardiomegaly.   Pt has heart issues for which he sees Cardiology. He had CABG, arrhythmias. Sees Cards yearly.   Recent exertional SOB and cough in July, post viral. Clinically  improved and is at baseline. He feels better with Symbicort, 160/4.5, 2 puffs twice a day.  We discussed the importance of taking medications regularly.   Continue with the following medications: -- Symbicort 160/4.5 2P BID. Patient is using samples from a friend. Once samples are consumed, we decided to see if he can be off medicine. If not, he will call and have prescription for Symbicort [facility] for a 3 month supply via mail. Ventolin prn > has enough refills c/o PCP.   Patient was instructed to rinse mouth each time he/she uses Symbiocrt. Occasional hoarseness. Will observe.   Influenza vaccine -- 2017 Pneumococcal vaccine -- received both per pt.   Patient was instructed to call the office if he/she has issues with the medications.   Patient was instructed to call the office if he/she is having issues with shortness of breath (i.e. COPD exacerbation).   Patient was instructed to call the office if with increased shortness of breath, cough, sputum production, fevers. Patient may need to be seen at the office for work-up.   Counseled patient on the importance of NOT smoking.   Pt felt better  Early part of 2017 with short course of prednisone.     Cough Patient is here for cough/COPD.  Patient has a 40PY smoking history, quit in 1999. Pt with severe copd. Cough is resolved.  Improved with COPD meds and short course prednisone.  Patient has chronic sinus issues which have been stable. Denies reflux symptoms. We'll observe for now.     Patient will follow up with me in 6 months.      Monica Becton, MD 07/29/2016   9:35 AM Pulmonary and [place] Pager: [phone] Office: [phone], Fax: [phone]

## 2016-07-29 NOTE — Patient Instructions (Signed)
It was a pleasure taking care of you today!  You are diagnosed with Chronic Obstructive Pulmonary Disease or COPD.  COPD is a preventable and treatable disease that makes it difficult to empty air out of the lungs (airflow obstruction).  This can lead to shortness of breath.   Sometimes, when you have a lung infection, this can make your breathing worse, and will cause you to have a COPD flare-up or an acute exacerbation of COPD. Please call your primary care doctor or the office if you are having a COPD flare-up.   Smoking makes COPD worse.   Make sure you use your medications for COPD -- Maintenance medications : Symbicort 160/4.5 2 puffs 2x/day.   Rescue medications: Albuterol 2 puffs every 4 hours as needed for shortness of breath.   Please rinse your mouth each time you use your maintenance medication.  Please call the office if you are having issues with your medications  Return to clinic in 6 months.

## 2016-07-29 NOTE — Assessment & Plan Note (Signed)
Patient has a 40PY smoking history, quit in 1999. PFT (04/2016)  FEV1  0.89  30%.  CXR (01/2016)  Cardiomegaly.   Pt has heart issues for which he sees Cardiology. He had CABG, arrhythmias. Sees Cards yearly.   Recent exertional SOB and cough in July, post viral. Clinically  improved and is at baseline. He feels better with Symbicort, 160/4.5, 2 puffs twice a day.  We discussed the importance of taking medications regularly.   Continue with the following medications: -- Symbicort 160/4.5 2P BID. Patient is using samples from a friend. Once samples are consumed, we decided to see if he can be off medicine. If not, he will call and have prescription for Symbicort sent to [facility] for a 3 month supply via mail. Ventolin prn > has enough refills c/o PCP.   Patient was instructed to rinse mouth each time he/she uses Symbiocrt. Occasional hoarseness. Will observe.   Influenza vaccine -- 2017 Pneumococcal vaccine -- received both per pt.   Patient was instructed to call the office if he/she has issues with the medications.   Patient was instructed to call the office if he/she is having issues with shortness of breath (i.e. COPD exacerbation).   Patient was instructed to call the office if with increased shortness of breath, cough, sputum production, fevers. Patient may need to be seen at the office for work-up.   Counseled patient on the importance of NOT smoking.   Pt felt better  Early part of 2017 with short course of prednisone.

## 2016-08-05 ENCOUNTER — Ambulatory Visit (INDEPENDENT_AMBULATORY_CARE_PROVIDER_SITE_OTHER): Payer: Medicare Other | Admitting: Pharmacist

## 2016-08-05 VITALS — BP 124/78 | HR 73

## 2016-08-05 DIAGNOSIS — I10 Essential (primary) hypertension: Secondary | ICD-10-CM

## 2016-08-05 NOTE — Progress Notes (Signed)
Patient ID: LATERRENCE BENNIGHT                 DOB: 11-03-1935                      MRN: [id number]     HPI: Larry Briggs is a 81 y.o. male patient of Dr. Martinique with PMH relevant for HTN, ASCVD, HLD.   Current HTN meds:  Chlorthalidone 25mg  in afternoon Valsartan 320mg  daily Metoprolol XL 25mg  in morning and 50mg  daily in the evening  Previously tried: lisinopril - cough Doxazosin - extreme dizziness  BP goal: <130/80  Family History: Mother passed of a hemorraghic stroke. His father passed from a MI. He has 2 sisters one of which recently passed from a stroke.   Diet:He endorses eating out occasionally. His wife reports that they use "light salt" at home. He admits he has not been great about keeping hydrated with the heat but he does not believe all of the low blood pressures are associated with dehyration.   Exercise:denies exercise  Home BP readings: 14 reading; average 134/77 (HR 68) with range 113-152/64-89  Wt Readings from Last 3 Encounters:  07/29/16 185 lb (83.9 kg)  06/18/16 182 lb 6.4 oz (82.7 kg)  04/22/16 179 lb 9.6 oz (81.5 kg)   BP Readings from Last 3 Encounters:  07/29/16 126/78  06/18/16 (!) 164/83  04/22/16 126/84   Pulse Readings from Last 3 Encounters:  07/29/16 76  06/18/16 78  04/22/16 82     Past Medical History:  Diagnosis Date  . CAD (coronary artery disease)    Remote MI in 1980. CABG 1995. Stent to left main in 2004. Last cath in April of 2012. EF 50%; patent LIMA to DX/LAD with collateralization of the distal right, patent SVG to OM, occluded SVG to distal RCA, and patent stent to LAD  . History of heart attack 1980  . Hypercholesteremia   . Hypertension   . Increased glucose level   . Obesity     Current Outpatient Prescriptions on File Prior to Visit  Medication Sig Dispense Refill  . Ascorbic Acid (VITAMIN C) 500 MG CAPS Take 1 capsule by mouth daily. ( Estra-C )     . aspirin 81 MG EC tablet Take 81 mg by mouth  daily.     . budesonide-formoterol (SYMBICORT) 160-4.5 MCG/ACT inhaler Inhale 2 puffs into the lungs 2 (two) times daily. 1 Inhaler 6  . chlorthalidone (HYGROTON) 25 MG tablet Take 1 tablet (25 mg total) by mouth daily. 90 tablet 3  . fish oil-omega-3 fatty acids 1000 MG capsule Take 1 g by mouth daily.     . metoprolol succinate (TOPROL-XL) 50 MG 24 hr tablet Take 1 tablet (50 mg total) by mouth daily. (Patient taking differently: Take 75 mg by mouth daily. ) 90 tablet 3  . multivitamin-iron-minerals-folic acid (CENTRUM) chewable tablet Chew 1 tablet by mouth daily.    . nitroGLYCERIN (NITROSTAT) 0.4 MG SL tablet Place 1 tablet (0.4 mg total) under the tongue as needed. 25 tablet 3  . simvastatin (ZOCOR) 20 MG tablet Take 1 tablet (20 mg total) by mouth at bedtime. 90 tablet 3  . valsartan (DIOVAN) 320 MG tablet Take 1 tablet (320 mg total) by mouth daily. 90 tablet 3  . VENTOLIN HFA 108 (90 Base) MCG/ACT inhaler      No current facility-administered medications on file prior to visit.     Allergies  Allergen Reactions  .  Iodinated Diagnostic Agents     Assessment/Plan:  Hypertension: BP is at desired goal of <130/80 today during office visit.  Home BP reading average slightly higher at 136/77 (HR 68) but due to recent problems with hypotension, is not appropriate to increase medication any more at this time.  Patient to continue ALL medication as prescribed.  Will continue to monitor BP at home daily and keep records of readings. Patient to follow up with cardiologist as scheduled and with hypertension clinic as needed. Encourage to call if BP above 150/100 or if hypotensive episode.  BMET and lipid panel completed by PCP on 07/22/16, we received copy of Doctor Notes but not laboratory report. Got phone number from patient to call and obtain copy of report.  Sequoia Mincey Rodriguez-Guzman PharmD, [place] [id number] 08/05/2016 5:31  PM

## 2016-08-05 NOTE — Patient Instructions (Addendum)
Blood pressure today is 124/78 with pulse 73  1. Continue BP medication as follow:  2. Continue lifestyle modification  3. Monitor BP at home and keep records for next visit  4. Follow up with hypertension clinic as needed.    Hypertension Hypertension, commonly called high blood pressure, is when the force of blood pumping through your arteries is too strong. Your arteries are the blood vessels that carry blood from your heart throughout your body. A blood pressure reading consists of a higher number over a lower number, such as 110/72. The higher number (systolic) is the pressure inside your arteries when your heart pumps. The lower number (diastolic) is the pressure inside your arteries when your heart relaxes. Ideally you want your blood pressure below 120/80. Hypertension forces your heart to work harder to pump blood. Your arteries may become narrow or stiff. Having untreated or uncontrolled hypertension can cause heart attack, stroke, kidney disease, and other problems. What increases the risk? Some risk factors for high blood pressure are controllable. Others are not. Risk factors you cannot control include:  Race. You may be at higher risk if you are African American.  Age. Risk increases with age.  Gender. Men are at higher risk than women before age 63 years. After age 23, women are at higher risk than men. Risk factors you can control include:  Not getting enough exercise or physical activity.  Being overweight.  Getting too much fat, sugar, calories, or salt in your diet.  Drinking too much alcohol. What are the signs or symptoms? Hypertension does not usually cause signs or symptoms. Extremely high blood pressure (hypertensive crisis) may cause headache, anxiety, shortness of breath, and nosebleed. How is this diagnosed? To check if you have hypertension, your health care provider will measure your blood pressure while you are seated, with your arm held at the level  of your heart. It should be measured at least twice using the same arm. Certain conditions can cause a difference in blood pressure between your right and left arms. A blood pressure reading that is higher than normal on one occasion does not mean that you need treatment. If it is not clear whether you have high blood pressure, you may be asked to return on a different day to have your blood pressure checked again. Or, you may be asked to monitor your blood pressure at home for 1 or more weeks. How is this treated? Treating high blood pressure includes making lifestyle changes and possibly taking medicine. Living a healthy lifestyle can help lower high blood pressure. You may need to change some of your habits. Lifestyle changes may include:  Following the DASH diet. This diet is high in fruits, vegetables, and whole grains. It is low in salt, red meat, and added sugars.  Keep your sodium intake below 2,300 mg per day.  Getting at least 30-45 minutes of aerobic exercise at least 4 times per week.  Losing weight if necessary.  Not smoking.  Limiting alcoholic beverages.  Learning ways to reduce stress. Your health care provider may prescribe medicine if lifestyle changes are not enough to get your blood pressure under control, and if one of the following is true:  You are 40-58 years of age and your systolic blood pressure is above 140.  You are 3 years of age or older, and your systolic blood pressure is above 150.  Your diastolic blood pressure is above 90.  You have diabetes, and your systolic blood pressure is over  [id number] or your diastolic blood pressure is over 90.  You have kidney disease and your blood pressure is above 140/90.  You have heart disease and your blood pressure is above 140/90. Your personal target blood pressure may vary depending on your medical conditions, your age, and other factors. Follow these instructions at home:  Have your blood pressure rechecked as  directed by your health care provider.  Take medicines only as directed by your health care provider. Follow the directions carefully. Blood pressure medicines must be taken as prescribed. The medicine does not work as well when you skip doses. Skipping doses also puts you at risk for problems.  Do not smoke.  Monitor your blood pressure at home as directed by your health care provider. Contact a health care provider if:  You think you are having a reaction to medicines taken.  You have recurrent headaches or feel dizzy.  You have swelling in your ankles.  You have trouble with your vision. Get help right away if:  You develop a severe headache or confusion.  You have unusual weakness, numbness, or feel faint.  You have severe chest or abdominal pain.  You vomit repeatedly.  You have trouble breathing. This information is not intended to replace advice given to you by your health care provider. Make sure you discuss any questions you have with your health care provider. Document Released: 07/19/2005 Document Revised: 12/25/2015 Document Reviewed: 05/11/2013 Elsevier Interactive Patient Education  2017 [employer].

## 2016-08-06 ENCOUNTER — Telehealth: Payer: Self-pay | Admitting: Pharmacist

## 2016-08-06 NOTE — Telephone Encounter (Signed)
Called Dr Pennie Banter office today per patient's request.  Medical records will fax labs results including BMET to DR Martinique today.

## 2016-08-11 DIAGNOSIS — D72829 Elevated white blood cell count, unspecified: Secondary | ICD-10-CM | POA: Diagnosis not present

## 2016-08-24 ENCOUNTER — Telehealth: Payer: Self-pay | Admitting: Pulmonary Disease

## 2016-08-24 MED ORDER — BUDESONIDE-FORMOTEROL FUMARATE 160-4.5 MCG/ACT IN AERO: 2.0000 | INHALATION_SPRAY | Freq: Two times a day (BID) | RESPIRATORY_TRACT | 3 refills | Status: DC

## 2016-08-24 NOTE — Telephone Encounter (Signed)
Spoke with pt's wife, Manuela Schwartz. They need pt's Symbicort prescription sent to [facility]. Rx has been sent in. Nothing further was needed at this time.

## 2016-08-30 DIAGNOSIS — N5201 Erectile dysfunction due to arterial insufficiency: Secondary | ICD-10-CM | POA: Diagnosis not present

## 2016-08-30 DIAGNOSIS — R350 Frequency of micturition: Secondary | ICD-10-CM | POA: Diagnosis not present

## 2016-08-30 DIAGNOSIS — R972 Elevated prostate specific antigen [PSA]: Secondary | ICD-10-CM | POA: Diagnosis not present

## 2016-08-30 DIAGNOSIS — N202 Calculus of kidney with calculus of ureter: Secondary | ICD-10-CM | POA: Diagnosis not present

## 2016-11-15 DIAGNOSIS — R972 Elevated prostate specific antigen [PSA]: Secondary | ICD-10-CM | POA: Diagnosis not present

## 2016-11-22 ENCOUNTER — Telehealth: Payer: Self-pay | Admitting: Pulmonary Disease

## 2016-11-22 DIAGNOSIS — N5201 Erectile dysfunction due to arterial insufficiency: Secondary | ICD-10-CM | POA: Diagnosis not present

## 2016-11-22 DIAGNOSIS — R350 Frequency of micturition: Secondary | ICD-10-CM | POA: Diagnosis not present

## 2016-11-22 DIAGNOSIS — R972 Elevated prostate specific antigen [PSA]: Secondary | ICD-10-CM | POA: Diagnosis not present

## 2016-11-22 NOTE — Telephone Encounter (Signed)
lmom tcb x1 to pt for more information  [place] who shipped the pt 3 inhalers on 08/27/16

## 2016-11-23 MED ORDER — BUDESONIDE-FORMOTEROL FUMARATE 160-4.5 MCG/ACT IN AERO
2.0000 | INHALATION_SPRAY | Freq: Two times a day (BID) | RESPIRATORY_TRACT | 3 refills | Status: DC
Start: 2016-11-23 — End: 2017-09-01

## 2016-11-23 NOTE — Telephone Encounter (Signed)
Spoke with pt's wife Manuela Schwartz, states that pt's wife accidentally cancelled symbicort rx through [facility], needs new rx sent to pharmacy.  This has been sent.  Nothing further needed.

## 2016-11-23 NOTE — Telephone Encounter (Signed)
lmtcb for pt.  

## 2016-11-23 NOTE — Telephone Encounter (Signed)
Patient wife returned call - she can be reached at [phone] -pr

## 2016-11-30 ENCOUNTER — Encounter: Payer: Self-pay | Admitting: Cardiology

## 2016-12-17 ENCOUNTER — Encounter: Payer: Self-pay | Admitting: Cardiology

## 2016-12-17 ENCOUNTER — Ambulatory Visit (INDEPENDENT_AMBULATORY_CARE_PROVIDER_SITE_OTHER): Payer: Medicare Other | Admitting: Cardiology

## 2016-12-17 VITALS — BP 110/60 | HR 70 | Ht 69.0 in | Wt 181.6 lb

## 2016-12-17 DIAGNOSIS — I10 Essential (primary) hypertension: Secondary | ICD-10-CM | POA: Diagnosis not present

## 2016-12-17 DIAGNOSIS — E7800 Pure hypercholesterolemia, unspecified: Secondary | ICD-10-CM | POA: Diagnosis not present

## 2016-12-17 DIAGNOSIS — I2510 Atherosclerotic heart disease of native coronary artery without angina pectoris: Secondary | ICD-10-CM

## 2016-12-17 MED ORDER — TAMSULOSIN HCL 0.4 MG PO CAPS: 0.4000 mg | ORAL_CAPSULE | Freq: Every day | ORAL | Status: DC

## 2016-12-17 MED ORDER — METOPROLOL SUCCINATE ER 50 MG PO TB24: 50.0000 mg | ORAL_TABLET | Freq: Every day | ORAL | 3 refills | Status: DC

## 2016-12-17 MED ORDER — NITROGLYCERIN 0.4 MG SL SUBL: 0.4000 mg | SUBLINGUAL_TABLET | SUBLINGUAL | 3 refills | Status: DC | PRN

## 2016-12-17 NOTE — Patient Instructions (Signed)
Reduce Toprol XL to 50 mg daily  I will see you in 6 months.

## 2016-12-17 NOTE — Progress Notes (Signed)
Larry Briggs Date of Birth: 03-25-1936 Medical Record #[id number]  History of Present Illness: Larry Briggs is seen back today for follow up CAD. He has a known history of coronary disease and status post coronary bypass surgery in 1995. He underwent stenting of the left main coronary in 2004. His last cardiac catheterization in April 2012 showed that everything was patent except for a vein graft to the distal right coronary. The PDA was supplied by left to right collaterals. He has been followed in our HTN clinic. He was having side effects on medications. This improved with stopping lisinopril and doxazosin and starting on valsartan. He does have severe COPD. On his last visit his BP was poorly controlled. We increased his valsartan dose and on follow up in January in the Hypertension clinic his BP control was improved. He notes that he was started on Flomax in January for prostate problems and this has helped. He did have PFTs done in September 2017 showing severe COPD. He reports breathing is much better on Symbicort. He is not exercising much other than doing yard work. Not walking on treadmill.   Current Outpatient Prescriptions on File Prior to Visit  Medication Sig Dispense Refill  . Ascorbic Acid (VITAMIN C) 500 MG CAPS Take 1 capsule by mouth daily. ( Estra-C )     . aspirin 81 MG EC tablet Take 81 mg by mouth daily.     . budesonide-formoterol (SYMBICORT) 160-4.5 MCG/ACT inhaler Inhale 2 puffs into the lungs 2 (two) times daily. 3 Inhaler 3  . fish oil-omega-3 fatty acids 1000 MG capsule Take 1 g by mouth daily.     . multivitamin-iron-minerals-folic acid (CENTRUM) chewable tablet Chew 1 tablet by mouth daily.    . simvastatin (ZOCOR) 20 MG tablet Take 1 tablet (20 mg total) by mouth at bedtime. 90 tablet 3  . valsartan (DIOVAN) 320 MG tablet Take 1 tablet (320 mg total) by mouth daily. 90 tablet 3  . VENTOLIN HFA 108 (90 Base) MCG/ACT inhaler     . chlorthalidone (HYGROTON) 25 MG  tablet Take 1 tablet (25 mg total) by mouth daily. 90 tablet 3   No current facility-administered medications on file prior to visit.     Allergies  Allergen Reactions  . Iodinated Diagnostic Agents     Past Medical History:  Diagnosis Date  . CAD (coronary artery disease)    Remote MI in 1980. CABG 1995. Stent to left main in 2004. Last cath in April of 2012. EF 50%; patent LIMA to DX/LAD with collateralization of the distal right, patent SVG to OM, occluded SVG to distal RCA, and patent stent to LAD  . History of heart attack 1980  . Hypercholesteremia   . Hypertension   . Increased glucose level   . Obesity     Past Surgical History:  Procedure Laterality Date  . CARDIAC CATHETERIZATION  1993  . CARDIAC CATHETERIZATION  April 2012   Mild reduction if EF at 50%. Patent LIMA to DX/LAD with collateralization to distal RCA, patent SVG to OM and occluded SVG to distal right and patent stent to LAD/Left main;  . CORONARY ARTERY BYPASS GRAFT  05/1994   x5 LIMA to LAD & DX, SVG to LCX, SVG to RCA  . CORONARY STENT PLACEMENT  2004   Stent to the L main    History  Smoking Status  . Former Smoker  . Packs/day: 1.00  . Years: 40.00  . Types: Cigarettes  . Quit date:  07/02/1994  Smokeless Tobacco  . Never Used    History  Alcohol Use No    Family History  Problem Relation Age of Onset  . Heart attack Father   . Hypertension Father   . Stroke Mother     Review of Systems: The review of systems is per the HPI.  All other systems were reviewed and are negative.  Physical Exam: BP 110/60 (BP Location: Left Arm, Patient Position: Sitting, Cuff Size: Normal)   Pulse 70   Ht 5\' 9"  (1.753 m)   Wt 181 lb 9.6 oz (82.4 kg)   BMI 26.82 kg/m  Patient is very pleasant, obese, pale WM and in no acute distress. Skin is warm and dry. Color is normal.  HEENT is unremarkable. Normocephalic/atraumatic. PERRL. Sclera are nonicteric. Neck is supple. No masses. No JVD. Lungs are clear.  Cardiac exam shows a regular rate and rhythm. Normal S1-2. No gallop or murmur. Abdomen is soft. Extremities are without edema. Gait and ROM are intact. No gross neurologic deficits noted.   LABORATORY DATA:   Lab Results  Component Value Date   WBC 6.0 11/14/2010   HGB 14.3 11/14/2010   HCT 40.6 11/14/2010   PLT 209 11/14/2010   GLUCOSE 121 (H) 03/09/2016   CHOL  11/14/2010    112        ATP III CLASSIFICATION:  <200     mg/dL   Desirable  200-239  mg/dL   Borderline High  >=240    mg/dL   High          TRIG 108 11/14/2010   HDL 40 11/14/2010   LDLCALC  11/14/2010    50        Total Cholesterol/HDL:CHD Risk Coronary Heart Disease Risk Table                     Men   Women  1/2 Average Risk   3.4   3.3  Average Risk       5.0   4.4  2 X Average Risk   9.6   7.1  3 X Average Risk  23.4   11.0        Use the calculated Patient Ratio above and the CHD Risk Table to determine the patient's CHD Risk.        ATP III CLASSIFICATION (LDL):  <100     mg/dL   Optimal  100-129  mg/dL   Near or Above                    Optimal  130-159  mg/dL   Borderline  160-189  mg/dL   High  >190     mg/dL   Very High   NA 140 03/09/2016   K 4.8 03/09/2016   CL 99 03/09/2016   CREATININE 0.86 03/09/2016   BUN 12 03/09/2016   CO2 28 03/09/2016   INR 1.05 11/13/2010   Labs reviewed from 07/22/16: cholesterol 174, triglycerides 140, LDL 82, HDL 64. CMET normal Except creatinine reported as 242 (this has to be an error).    Assessment / Plan: 1. CAD s/p CABG 1995. S/p stenting of the left main coronary artery 2004.  Cath 2012 showed occlusion of SVG to diagonal. Asymptomatic. Continue current antianginal Rx. We discussed follow up stress testing but patient deferred. He is to report any new chest discomfort or worsening dyspnea. Encourage increased aerobic activity with walking.   2. HTN- now well controlled on  higher dose of valsartan to 320 mg daily. History of orthostasis on doxazosin.  So far tolerating Flomax OK. Will reduce Toprol XL to 50 mg daily to avoid orthostasis.   3. Hyperlipidemia. Labs reviewed from Dr. Shelia Media  and lipids are well controlled. Continue statin and fish oil.  4. Severe COPD- followed by pulmonary.   Follow up in 6 months.

## 2016-12-24 ENCOUNTER — Telehealth: Payer: Self-pay | Admitting: Pulmonary Disease

## 2016-12-24 NOTE — Telephone Encounter (Signed)
Pt has been scheduled for 12/28/16 @ 12:00 for acute visit per pt wife request. Nothing further needed.

## 2016-12-28 ENCOUNTER — Ambulatory Visit: Payer: Medicare Other | Admitting: Pulmonary Disease

## 2016-12-29 ENCOUNTER — Ambulatory Visit: Payer: Medicare Other | Admitting: Pulmonary Disease

## 2016-12-30 ENCOUNTER — Encounter: Payer: Self-pay | Admitting: Pulmonary Disease

## 2016-12-30 ENCOUNTER — Ambulatory Visit (INDEPENDENT_AMBULATORY_CARE_PROVIDER_SITE_OTHER): Payer: Medicare Other | Admitting: Pulmonary Disease

## 2016-12-30 VITALS — BP 98/62 | HR 69 | Ht 69.0 in | Wt 188.0 lb

## 2016-12-30 DIAGNOSIS — J449 Chronic obstructive pulmonary disease, unspecified: Secondary | ICD-10-CM

## 2016-12-30 DIAGNOSIS — R05 Cough: Secondary | ICD-10-CM | POA: Diagnosis not present

## 2016-12-30 DIAGNOSIS — R059 Cough, unspecified: Secondary | ICD-10-CM

## 2016-12-30 DIAGNOSIS — I2510 Atherosclerotic heart disease of native coronary artery without angina pectoris: Secondary | ICD-10-CM

## 2016-12-30 MED ORDER — AZITHROMYCIN 250 MG PO TABS: ORAL_TABLET | ORAL | 0 refills | Status: DC

## 2016-12-30 MED ORDER — PREDNISONE 10 MG PO TABS: ORAL_TABLET | ORAL | 0 refills | Status: DC

## 2016-12-30 MED ORDER — AZITHROMYCIN 250 MG PO TABS: ORAL_TABLET | ORAL | 0 refills | Status: AC

## 2016-12-30 MED ORDER — VENTOLIN HFA 108 (90 BASE) MCG/ACT IN AERS: 2.0000 | INHALATION_SPRAY | RESPIRATORY_TRACT | 3 refills | Status: DC | PRN

## 2016-12-30 NOTE — Patient Instructions (Addendum)
Will give you a prescription for a Z-Pak Short prednisone taper starting at 40 mg. Reduce dose by 10 mg every 2 days We give her a prescription for albuterol rescue inhaler Continue using the Symbicort as prescribed  Please call us if there is no improvement of symptoms or worsening of symptoms. Follow-up in clinic as scheduled with Dr. Lake Bells

## 2016-12-30 NOTE — Progress Notes (Signed)
Larry Briggs    [id number]    [birth year]  Primary Care Physician:Pharr, Thayer Jew, MD  Referring Physician: [name], MD [address] [place], [place] [address]  Chief complaint:  Acute visit for dyspnea  HPI: 81 year old with history of COPD, 40-pack-year smoking history, quit in 1999, CABG, arrhythmias Reports increasing cough with yellow, green sputum production, chest congestion for the past 2 weeks associated with increased dyspnea, wheezing. Denies any fevers, chills, hemoptysis.  Outpatient Encounter Prescriptions as of 12/30/2016  Medication Sig  . Ascorbic Acid (VITAMIN C) 500 MG CAPS Take 1 capsule by mouth daily. ( Estra-C )   . aspirin 81 MG EC tablet Take 81 mg by mouth daily.   . budesonide-formoterol (SYMBICORT) 160-4.5 MCG/ACT inhaler Inhale 2 puffs into the lungs 2 (two) times daily.  . fish oil-omega-3 fatty acids 1000 MG capsule Take 1 g by mouth daily.   . metoprolol succinate (TOPROL-XL) 50 MG 24 hr tablet Take 1 tablet (50 mg total) by mouth daily.  . multivitamin-iron-minerals-folic acid (CENTRUM) chewable tablet Chew 1 tablet by mouth daily.  . nitroGLYCERIN (NITROSTAT) 0.4 MG SL tablet Place 1 tablet (0.4 mg total) under the tongue as needed.  . simvastatin (ZOCOR) 20 MG tablet Take 1 tablet (20 mg total) by mouth at bedtime.  . tamsulosin (FLOMAX) 0.4 MG CAPS capsule Take 1 capsule (0.4 mg total) by mouth daily.  . valsartan (DIOVAN) 320 MG tablet Take 1 tablet (320 mg total) by mouth daily.  . VENTOLIN HFA 108 (90 Base) MCG/ACT inhaler   . chlorthalidone (HYGROTON) 25 MG tablet Take 1 tablet (25 mg total) by mouth daily.   No facility-administered encounter medications on file as of 12/30/2016.     Allergies as of 12/30/2016 - Review Complete 12/30/2016  Allergen Reaction Noted  . Iodinated diagnostic agents  11/19/2010    Past Medical History:  Diagnosis Date  . CAD (coronary artery disease)    Remote MI in 1980.  CABG 1995. Stent to left main in 2004. Last cath in April of 2012. EF 50%; patent LIMA to DX/LAD with collateralization of the distal right, patent SVG to OM, occluded SVG to distal RCA, and patent stent to LAD  . History of heart attack 1980  . Hypercholesteremia   . Hypertension   . Increased glucose level   . Obesity     Past Surgical History:  Procedure Laterality Date  . CARDIAC CATHETERIZATION  1993  . CARDIAC CATHETERIZATION  April 2012   Mild reduction if EF at 50%. Patent LIMA to DX/LAD with collateralization to distal RCA, patent SVG to OM and occluded SVG to distal right and patent stent to LAD/Left main;  . CORONARY ARTERY BYPASS GRAFT  05/1994   x5 LIMA to LAD & DX, SVG to LCX, SVG to RCA  . CORONARY STENT PLACEMENT  2004   Stent to the L main    Family History  Problem Relation Age of Onset  . Heart attack Father   . Hypertension Father   . Stroke Mother     Social History   Social History  . Marital status: Married    Spouse name: N/A  . Number of children: N/A  . Years of education: N/A   Occupational History  . Not on file.   Social History Main Topics  . Smoking status: Former Smoker    Packs/day: 1.00    Years: 40.00    Types: Cigarettes  Quit date: 07/02/1994  . Smokeless tobacco: Never Used  . Alcohol use No  . Drug use: No  . Sexual activity: Yes   Other Topics Concern  . Not on file   Social History Narrative  . No narrative on file   Review of systems: Review of Systems  Constitutional: Negative for fever and chills.  HENT: Negative.   Eyes: Negative for blurred vision.  Respiratory: as per HPI  Cardiovascular: Negative for chest pain and palpitations.  Gastrointestinal: Negative for vomiting, diarrhea, blood per rectum. Genitourinary: Negative for dysuria, urgency, frequency and hematuria.  Musculoskeletal: Negative for myalgias, back pain and joint pain.  Skin: Negative for itching and rash.  Neurological: Negative for  dizziness, tremors, focal weakness, seizures and loss of consciousness.  Endo/Heme/Allergies: Negative for environmental allergies.  Psychiatric/Behavioral: Negative for depression, suicidal ideas and hallucinations.  All other systems reviewed and are negative.  Physical Exam: Blood pressure 98/62, pulse 69, height 5\' 9"  (1.753 m), weight 188 lb (85.3 kg), SpO2 98 %. Gen:      No acute distress HEENT:  EOMI, sclera anicteric Neck:     No masses; no thyromegaly Lungs:    Mild crackles with exp wheeze; normal respiratory effort CV:         Regular rate and rhythm; no murmurs Abd:      + bowel sounds; soft, non-tender; no palpable masses, no distension Ext:    No edema; adequate peripheral perfusion Skin:      Warm and dry; no rash Neuro: alert and oriented x 3 Psych: normal mood and affect  Data Reviewed: PFTs 04/26/16 FVC 2.41 (58%) FEV1 0.90 (30%) F/F 37 TLC 92% DLCO 41% Severe obstruction with diffusion defect  Chest x-ray. 11/13/10-hyperinflation Chest x-ray 02/23/16-  hyperinflation, no acute changes I have reviewed all images personally.  Assessment/Plan: Severe COPD with acute exacerbation Continue on the Symbicort 2 puffs twice daily Use albuterol inhaler as needed for increased dyspnea, wheezing We'll give a prescription for Z-Pak and short prednisone taper starting at 40 mg. Reduce dose by 10 mg every 2 days  Marshell Garfinkel MD  Pulmonary and Critical Care Pager [phone] 12/30/2016, 2:55 PM  CC: [name], MD

## 2017-01-07 ENCOUNTER — Telehealth: Payer: Self-pay | Admitting: Pulmonary Disease

## 2017-01-07 MED ORDER — UMECLIDINIUM BROMIDE 62.5 MCG/INH IN AEPB: 1.0000 | INHALATION_SPRAY | Freq: Every day | RESPIRATORY_TRACT | 6 refills | Status: DC

## 2017-01-07 MED ORDER — PREDNISONE 10 MG PO TABS: ORAL_TABLET | ORAL | 0 refills | Status: DC

## 2017-01-07 NOTE — Telephone Encounter (Signed)
Ok to call in prednisone 40 mg. Reduce dose by 10 mg every 3 days Use mucinex over the counter Continue symbicort. Add incruse inhaler. Please call it in.  PM

## 2017-01-07 NOTE — Telephone Encounter (Signed)
Pt c/o persistent cough and congestion x 2 days.  Pt unable to get any mucus up with cough.  Pt not sleeping well d/t cough, having to sit up.  Pt using equate NyQuil/DayQuil -- no relief.  Pt has not tried Delsym or Robitussin.  Pt completed regimen and states that he had about 2 days with the regimen that he had a lot of relief in symptoms but they returned as soon as he tapered off the prednisone. Pt is asking if he can get more Prednisone called into [facility] and further recommendations.   Assessment/Plan 12/30/16: Severe COPD with acute exacerbation Continue on the Symbicort 2 puffs twice daily Use albuterol inhaler as needed for increased dyspnea, wheezing We'll give a prescription for Z-Pak and short prednisone taper starting at 40 mg. Reduce dose by 10 mg every 2 days  Please advise Dr Vaughan Browner. Thanks.

## 2017-01-07 NOTE — Telephone Encounter (Signed)
Pt wife aware of rec's per Dr Vaughan Browner.  Rx for Prednisone and Incruse sent to [facility] Nothing further needed.

## 2017-01-18 ENCOUNTER — Telehealth: Payer: Self-pay | Admitting: Pulmonary Disease

## 2017-01-18 NOTE — Telephone Encounter (Signed)
Spoke with the pt's spouse  She is asking if using air fresheners are okay to use in their home  I advised that as long as they are not making the pt have increased problems breathing  I explained that some pt's are sensitive to smells and others or not  She understands and will put these away if they do bother the pt  Nothing further needed

## 2017-01-25 DIAGNOSIS — E119 Type 2 diabetes mellitus without complications: Secondary | ICD-10-CM | POA: Diagnosis not present

## 2017-01-27 DIAGNOSIS — E119 Type 2 diabetes mellitus without complications: Secondary | ICD-10-CM | POA: Diagnosis not present

## 2017-01-27 DIAGNOSIS — I10 Essential (primary) hypertension: Secondary | ICD-10-CM | POA: Diagnosis not present

## 2017-02-21 ENCOUNTER — Ambulatory Visit (INDEPENDENT_AMBULATORY_CARE_PROVIDER_SITE_OTHER): Payer: Medicare Other | Admitting: Pulmonary Disease

## 2017-02-21 ENCOUNTER — Encounter: Payer: Self-pay | Admitting: Pulmonary Disease

## 2017-02-21 VITALS — BP 122/66 | HR 76 | Ht 69.0 in | Wt 187.2 lb

## 2017-02-21 DIAGNOSIS — J449 Chronic obstructive pulmonary disease, unspecified: Secondary | ICD-10-CM | POA: Diagnosis not present

## 2017-02-21 DIAGNOSIS — I2510 Atherosclerotic heart disease of native coronary artery without angina pectoris: Secondary | ICD-10-CM | POA: Diagnosis not present

## 2017-02-21 DIAGNOSIS — R0982 Postnasal drip: Secondary | ICD-10-CM

## 2017-02-21 DIAGNOSIS — R490 Dysphonia: Secondary | ICD-10-CM | POA: Diagnosis not present

## 2017-02-21 MED ORDER — AEROCHAMBER MV MISC: 0 refills | Status: DC

## 2017-02-21 NOTE — Progress Notes (Signed)
Subjective:    Patient ID: Larry Briggs, male    DOB: 1936/03/25, 81 y.o.   MRN: [id number]  Synopsis: Former patient of Dr. Corrie Dandy has COPD.  Patient has a 40PY smoking history, quit in 1996.  He also has a history of coronary artery disease and had a CABG in 1996.   HPI Chief Complaint  Patient presents with  . Follow-up    former AD pt being treated for COPD.  c/o sob with exertion, states this is stable for him.  denies any other current complaints.     Larry Briggs is here to establish care with me for his COPD.  He says that it never gave him trouble until a few years ago.    His wife says that he is short of breath all the time with activity: > recently carrying suitcases made him short of breath > the inhalers are helping him (symbicort)  Since taking symbicort he has been having more hoarseness.    He coughs up mucus from time to time.  No heartburn.  He does have a runny nose and post nasal drip which is worse in air conditioning and and the winter.  He denies itching of his eyes or a scratchy throat.  However he has noticed more symptoms.  l   He does not exercise regulalry.     Past Medical History:  Diagnosis Date  . CAD (coronary artery disease)    Remote MI in 1980. CABG 1995. Stent to left main in 2004. Last cath in April of 2012. EF 50%; patent LIMA to DX/LAD with collateralization of the distal right, patent SVG to OM, occluded SVG to distal RCA, and patent stent to LAD  . History of heart attack 1980  . Hypercholesteremia   . Hypertension   . Increased glucose level   . Obesity       Review of Systems  Constitutional: Negative for chills, fatigue and fever.  HENT: Positive for postnasal drip, rhinorrhea and sinus pain.   Respiratory: Positive for shortness of breath. Negative for choking and stridor.   Cardiovascular: Negative for chest pain and leg swelling.       Objective:   Physical Exam  Vitals:   02/21/17 1022  BP: 122/66  Pulse: 76    SpO2: 97%  Weight: 187 lb 3.2 oz (84.9 kg)  Height: 5\' 9"  (1.753 m)   General:  Resting comfortably in bed HENT: NCAT OP clear PULM: Some wheezing B, normal effort CV: RRR, no mgr GI: BS+, soft, nontender MSK: normal bulk and tone Neuro: awake, alert, no distress, MAEW   CBC    Component Value Date/Time   WBC 6.0 11/14/2010 0500   RBC 4.50 11/14/2010 0500   HGB 14.3 11/14/2010 0500   HCT 40.6 11/14/2010 0500   PLT 209 11/14/2010 0500   MCV 90.2 11/14/2010 0500   MCH 31.8 11/14/2010 0500   MCHC 35.2 11/14/2010 0500   RDW 13.0 11/14/2010 0500   LYMPHSABS 1.1 11/13/2010 0958   MONOABS 0.3 11/13/2010 0958   EOSABS 0.1 11/13/2010 0958   BASOSABS 0.1 11/13/2010 0958     PFT: PFT (04/2016)  FEV1  0.89  30%.   Chest imaging: CXR (01/2016)  Cardiomegaly.   Records form his prior pulmonary physician reviewed where he was cared for for his COPD.     Assessment & Plan:  Chronic obstructive pulmonary disease, unspecified COPD type ([place])  Post-nasal drip  Hoarseness   Discussion: This has been  a stable interval for Larry Briggs since the last visit, however he does have some hoarseness which I think is related to postnasal drip as well as the Symbicort. Postnasal drip is a new complaint today though it sounds like he's had this for a long time. From the sounds of things this is likely an allergy. Were going to give him a spacer device today to see if this helps with the hoarseness as well.  Plan: For your post nasal drip: Use [facility] Med rinses with distilled water at least twice per day using the instructions on the package. 1/2 hour after using the [facility] Med rinse, use Nasacort two puffs in each nostril once per day.  Remember that the Nasacort can take 1-2 weeks to work after regular use. Use generic zyrtec (cetirizine) every day.  If this doesn't help, then stop taking it and use chlorpheniramine-phenylephrine combination tablets.  For your COPD:  keep taking Symbicort as  you are doing Use the spacer device we gave you today Exercise regularly Get a flu shot in the fall  For your hoarseness: If the above interventions are ineffective, call us and we can prescribe a new inhaled medicine  We will see you back in 6 months or sooner if needed

## 2017-02-21 NOTE — Patient Instructions (Signed)
For your post nasal drip: Use Neil Med rinses with distilled water at least twice per day using the instructions on the package. 1/2 hour after using the [facility] Med rinse, use Nasacort two puffs in each nostril once per day.  Remember that the Nasacort can take 1-2 weeks to work after regular use. Use generic zyrtec (cetirizine) every day.  If this doesn't help, then stop taking it and use chlorpheniramine-phenylephrine combination tablets.  For your COPD:  keep taking Symbicort as you are doing Use the spacer device we gave you today Exercise regularly Get a flu shot in the fall  For your hoarseness: If the above interventions are ineffective, call us and we can prescribe a new inhaled medicine  We will see you back in 6 months or sooner if needed

## 2017-03-04 ENCOUNTER — Telehealth: Payer: Self-pay | Admitting: Cardiology

## 2017-03-04 MED ORDER — IRBESARTAN 300 MG PO TABS: 300.0000 mg | ORAL_TABLET | Freq: Every day | ORAL | 3 refills | Status: DC

## 2017-03-04 NOTE — Telephone Encounter (Signed)
Returned call to patient.Advised to stop valsartan and start irbesartan 300 mg daily.Advised to monitor B/P every 2 to 3 days for 2 weeks and call back if needed.

## 2017-03-04 NOTE — Telephone Encounter (Signed)
Pt c/o medication issue: ° °1. Name of Medication: Valsartan ° °2. How are you currently taking this medication (dosage and times per day)?  ° °3. Are you having a reaction (difficulty breathing--STAT)?  ° °4. What is your medication issue?  ° °

## 2017-05-05 DIAGNOSIS — M545 Low back pain: Secondary | ICD-10-CM | POA: Diagnosis not present

## 2017-05-09 ENCOUNTER — Other Ambulatory Visit: Payer: Self-pay | Admitting: Cardiology

## 2017-05-09 NOTE — Telephone Encounter (Signed)
Rx request sent to pharmacy.  

## 2017-05-13 DIAGNOSIS — Z23 Encounter for immunization: Secondary | ICD-10-CM | POA: Diagnosis not present

## 2017-05-23 DIAGNOSIS — R350 Frequency of micturition: Secondary | ICD-10-CM | POA: Diagnosis not present

## 2017-05-23 DIAGNOSIS — R972 Elevated prostate specific antigen [PSA]: Secondary | ICD-10-CM | POA: Diagnosis not present

## 2017-05-23 DIAGNOSIS — N202 Calculus of kidney with calculus of ureter: Secondary | ICD-10-CM | POA: Diagnosis not present

## 2017-05-23 DIAGNOSIS — N5201 Erectile dysfunction due to arterial insufficiency: Secondary | ICD-10-CM | POA: Diagnosis not present

## 2017-06-14 NOTE — Progress Notes (Signed)
Larry Briggs Date of Birth: 1935/09/09 Medical Record #[id number]  History of Present Illness: Larry Briggs is seen back today for follow up CAD. He has a known history of coronary disease and status post coronary bypass surgery in 1995. He underwent stenting of the left main coronary in 2004. His last cardiac catheterization in April 2012 showed that everything was patent except for a vein graft to the distal right coronary. The PDA was supplied by left to right collaterals. He has been followed in our HTN clinic. He was having side effects on medications. This improved with stopping lisinopril and doxazosin and starting on valsartan. He does have severe COPD. His valsartan was later switched to irbesartan.   On follow up today he is doing OK. Still has chronic SOB. No cough. No chest pain. He is quite sedentary and sits and watches TV all day. No edema or palpitations. BP has been well controlled. He has a lot of back pain which limits his activity as well. Does not want to consider injections.   Current Outpatient Medications on File Prior to Visit  Medication Sig Dispense Refill  . Ascorbic Acid (VITAMIN C) 500 MG CAPS Take 1 capsule by mouth daily. ( Estra-C )     . aspirin 81 MG EC tablet Take 81 mg by mouth daily.     . budesonide-formoterol (SYMBICORT) 160-4.5 MCG/ACT inhaler Inhale 2 puffs into the lungs 2 (two) times daily. 3 Inhaler 3  . chlorthalidone (HYGROTON) 25 MG tablet Take 25 mg daily by mouth.    . fish oil-omega-3 fatty acids 1000 MG capsule Take 1 g by mouth daily.     . irbesartan (AVAPRO) 300 MG tablet Take 1 tablet (300 mg total) by mouth daily. 90 tablet 3  . metoprolol succinate (TOPROL-XL) 50 MG 24 hr tablet TAKE 1 TABLET EVERY DAY 90 tablet 3  . multivitamin-iron-minerals-folic acid (CENTRUM) chewable tablet Chew 1 tablet by mouth daily.    . nitroGLYCERIN (NITROSTAT) 0.4 MG SL tablet Place 1 tablet (0.4 mg total) under the tongue as needed. 25 tablet 3  .  simvastatin (ZOCOR) 20 MG tablet Take 1 tablet (20 mg total) by mouth at bedtime. 90 tablet 3  . Spacer/Aero-Holding Chambers (AEROCHAMBER MV) inhaler Use as instructed 1 each 0  . VENTOLIN HFA 108 (90 Base) MCG/ACT inhaler Inhale 2 puffs into the lungs every 4 (four) hours as needed for wheezing or shortness of breath. 1 Inhaler 3  . chlorthalidone (HYGROTON) 25 MG tablet Take 1 tablet (25 mg total) by mouth daily. 90 tablet 3   No current facility-administered medications on file prior to visit.     Allergies  Allergen Reactions  . Iodinated Diagnostic Agents     Past Medical History:  Diagnosis Date  . CAD (coronary artery disease)    Remote MI in 1980. CABG 1995. Stent to left main in 2004. Last cath in April of 2012. EF 50%; patent LIMA to DX/LAD with collateralization of the distal right, patent SVG to OM, occluded SVG to distal RCA, and patent stent to LAD  . History of heart attack 1980  . Hypercholesteremia   . Hypertension   . Increased glucose level   . Obesity     Past Surgical History:  Procedure Laterality Date  . CARDIAC CATHETERIZATION  1993  . CARDIAC CATHETERIZATION  April 2012   Mild reduction if EF at 50%. Patent LIMA to DX/LAD with collateralization to distal RCA, patent SVG to OM and occluded SVG  to distal right and patent stent to LAD/Left main;  . CORONARY ARTERY BYPASS GRAFT  05/1994   x5 LIMA to LAD & DX, SVG to LCX, SVG to RCA  . CORONARY STENT PLACEMENT  2004   Stent to the L main    Social History   Tobacco Use  Smoking Status Former Smoker  . Packs/day: 1.00  . Years: 40.00  . Pack years: 40.00  . Types: Cigarettes  . Last attempt to quit: 07/02/1994  . Years since quitting: 22.9  Smokeless Tobacco Never Used    Social History   Substance and Sexual Activity  Alcohol Use No    Family History  Problem Relation Age of Onset  . Heart attack Father   . Hypertension Father   . Stroke Mother     Review of Systems: The review of  systems is per the HPI.  All other systems were reviewed and are negative.  Physical Exam: BP (!) 118/58   Pulse 90   Ht 5\' 10"  (1.778 m)   Wt 182 lb 3.2 oz (82.6 kg)   BMI 26.14 kg/m  GENERAL:  Pale, obese WM HEENT:  PERRL, EOMI, sclera are clear. Oropharynx is clear. NECK:  No jugular venous distention, carotid upstroke brisk and symmetric, no bruits, no thyromegaly or adenopathy LUNGS:  Clear to auscultation bilaterally CHEST:  Unremarkable HEART:  RRR,  PMI not displaced or sustained,S1 and S2 within normal limits, no S3, no S4: no clicks, no rubs, no murmurs ABD:  Soft, nontender. BS +, no masses or bruits. No hepatomegaly, no splenomegaly EXT:  2 + pulses throughout, no edema, no cyanosis no clubbing SKIN:  Warm and dry.  No rashes NEURO:  Alert and oriented x 3. Cranial nerves II through XII intact. PSYCH:  Cognitively intact    LABORATORY DATA:   Lab Results  Component Value Date   WBC 6.0 11/14/2010   HGB 14.3 11/14/2010   HCT 40.6 11/14/2010   PLT 209 11/14/2010   GLUCOSE 121 (H) 03/09/2016   CHOL  11/14/2010    112        ATP III CLASSIFICATION:  <200     mg/dL   Desirable  200-239  mg/dL   Borderline High  >=240    mg/dL   High          TRIG 108 11/14/2010   HDL 40 11/14/2010   LDLCALC  11/14/2010    50        Total Cholesterol/HDL:CHD Risk Coronary Heart Disease Risk Table                     Men   Women  1/2 Average Risk   3.4   3.3  Average Risk       5.0   4.4  2 X Average Risk   9.6   7.1  3 X Average Risk  23.4   11.0        Use the calculated Patient Ratio above and the CHD Risk Table to determine the patient's CHD Risk.        ATP III CLASSIFICATION (LDL):  <100     mg/dL   Optimal  100-129  mg/dL   Near or Above                    Optimal  130-159  mg/dL   Borderline  160-189  mg/dL   High  >190     mg/dL   Very  High   NA 140 03/09/2016   K 4.8 03/09/2016   CL 99 03/09/2016   CREATININE 0.86 03/09/2016   BUN 12 03/09/2016   CO2  28 03/09/2016   INR 1.05 11/13/2010   Labs reviewed from 07/22/16: cholesterol 174, triglycerides 140, LDL 82, HDL 64. CMET normal Except creatinine reported as 242 (this has to be an error).  Dated 01/25/17: A1c 5.9%.   Ecg today shows NSR with PVCs. Nonspecific T wave abnormality. I have personally reviewed and interpreted this study.   Assessment / Plan: 1. CAD s/p CABG 1995. S/p stenting of the left main coronary artery 2004.  Cath 2012 showed occlusion of SVG to diagonal. He remains asymptomatic. Continue current anti-anginal Rx.   2. HTN- now well controlled on higher dose of irbesartan. He is going to check with his retail pharmacist to make sure he does not have the lot that was recalled. History of orthostasis on Cardura.   3. Hyperlipidemia.  Continue statin and fish oil. Plan follow up lab work with Dr. Shelia Media in December.   4. Severe COPD- followed by pulmonary.   Follow up in 6 months.

## 2017-06-17 ENCOUNTER — Ambulatory Visit (INDEPENDENT_AMBULATORY_CARE_PROVIDER_SITE_OTHER): Payer: Medicare Other | Admitting: Cardiology

## 2017-06-17 ENCOUNTER — Encounter: Payer: Self-pay | Admitting: Cardiology

## 2017-06-17 VITALS — BP 118/58 | HR 90 | Ht 70.0 in | Wt 182.2 lb

## 2017-06-17 DIAGNOSIS — I2510 Atherosclerotic heart disease of native coronary artery without angina pectoris: Secondary | ICD-10-CM

## 2017-06-17 DIAGNOSIS — E7800 Pure hypercholesterolemia, unspecified: Secondary | ICD-10-CM | POA: Diagnosis not present

## 2017-06-17 DIAGNOSIS — I10 Essential (primary) hypertension: Secondary | ICD-10-CM | POA: Diagnosis not present

## 2017-06-17 NOTE — Patient Instructions (Signed)
Continue your current therapy  I will see you in 6 months.   

## 2017-06-17 NOTE — Addendum Note (Signed)
Addended by: Kathyrn Lass on: 06/17/2017 02:33 PM   Modules accepted: Orders

## 2017-07-05 DIAGNOSIS — H5203 Hypermetropia, bilateral: Secondary | ICD-10-CM | POA: Diagnosis not present

## 2017-07-05 DIAGNOSIS — H26103 Unspecified traumatic cataract, bilateral: Secondary | ICD-10-CM | POA: Diagnosis not present

## 2017-07-05 DIAGNOSIS — H2513 Age-related nuclear cataract, bilateral: Secondary | ICD-10-CM | POA: Diagnosis not present

## 2017-07-05 DIAGNOSIS — H52203 Unspecified astigmatism, bilateral: Secondary | ICD-10-CM | POA: Diagnosis not present

## 2017-07-27 DIAGNOSIS — Z0000 Encounter for general adult medical examination without abnormal findings: Secondary | ICD-10-CM | POA: Diagnosis not present

## 2017-07-27 DIAGNOSIS — E7800 Pure hypercholesterolemia, unspecified: Secondary | ICD-10-CM | POA: Diagnosis not present

## 2017-07-27 DIAGNOSIS — Z125 Encounter for screening for malignant neoplasm of prostate: Secondary | ICD-10-CM | POA: Diagnosis not present

## 2017-07-27 DIAGNOSIS — I10 Essential (primary) hypertension: Secondary | ICD-10-CM | POA: Diagnosis not present

## 2017-08-01 DIAGNOSIS — I259 Chronic ischemic heart disease, unspecified: Secondary | ICD-10-CM | POA: Diagnosis not present

## 2017-08-01 DIAGNOSIS — E785 Hyperlipidemia, unspecified: Secondary | ICD-10-CM | POA: Diagnosis not present

## 2017-08-01 DIAGNOSIS — I10 Essential (primary) hypertension: Secondary | ICD-10-CM | POA: Diagnosis not present

## 2017-08-01 DIAGNOSIS — I739 Peripheral vascular disease, unspecified: Secondary | ICD-10-CM | POA: Diagnosis not present

## 2017-08-01 DIAGNOSIS — E1121 Type 2 diabetes mellitus with diabetic nephropathy: Secondary | ICD-10-CM | POA: Diagnosis not present

## 2017-08-01 DIAGNOSIS — D692 Other nonthrombocytopenic purpura: Secondary | ICD-10-CM | POA: Diagnosis not present

## 2017-08-01 DIAGNOSIS — E119 Type 2 diabetes mellitus without complications: Secondary | ICD-10-CM | POA: Diagnosis not present

## 2017-08-01 DIAGNOSIS — N183 Chronic kidney disease, stage 3 (moderate): Secondary | ICD-10-CM | POA: Diagnosis not present

## 2017-08-01 DIAGNOSIS — E790 Hyperuricemia without signs of inflammatory arthritis and tophaceous disease: Secondary | ICD-10-CM | POA: Diagnosis not present

## 2017-08-01 DIAGNOSIS — R05 Cough: Secondary | ICD-10-CM | POA: Diagnosis not present

## 2017-08-01 DIAGNOSIS — K5730 Diverticulosis of large intestine without perforation or abscess without bleeding: Secondary | ICD-10-CM | POA: Diagnosis not present

## 2017-08-09 ENCOUNTER — Other Ambulatory Visit: Payer: Self-pay | Admitting: Cardiology

## 2017-08-09 MED ORDER — SIMVASTATIN 20 MG PO TABS: 20.0000 mg | ORAL_TABLET | Freq: Every day | ORAL | 2 refills | Status: DC

## 2017-08-09 NOTE — Telephone Encounter (Signed)
°*  STAT* If patient is at the pharmacy, call can be transferred to refill team.   1. Which medications need to be refilled? (please list name of each medication and dose if known) Simvastatin 20 mg   2. Which pharmacy/location (including street and city if local pharmacy) is medication to be sent to?[place]   3. Do they need a 30 day or 90 day supply? [place]

## 2017-08-09 NOTE — Telephone Encounter (Signed)
Rx has been sent to the pharmacy electronically. ° °

## 2017-08-11 DIAGNOSIS — I739 Peripheral vascular disease, unspecified: Secondary | ICD-10-CM | POA: Diagnosis not present

## 2017-08-29 ENCOUNTER — Telehealth: Payer: Self-pay | Admitting: Pulmonary Disease

## 2017-08-29 NOTE — Telephone Encounter (Signed)
Spoke with patient's wife. She was asking for a RX for generic Symbicort. Advised patient that there is no generic for Symbicort. She then wanted to know if the patient could take Pulmicort.   Advised her to contact patient's insurance for formulary. She verbalized understanding.

## 2017-08-30 ENCOUNTER — Telehealth: Payer: Self-pay | Admitting: Pulmonary Disease

## 2017-08-30 NOTE — Telephone Encounter (Signed)
Left message for Susan to call back

## 2017-09-01 MED ORDER — FLUTICASONE-SALMETEROL 232-14 MCG/ACT IN AEPB: 1.0000 | INHALATION_SPRAY | Freq: Two times a day (BID) | RESPIRATORY_TRACT | 3 refills | Status: DC

## 2017-09-01 NOTE — Telephone Encounter (Signed)
Spoke with Larry Briggs, she states if BQ is ok with trying the Airduo and if so she would like it sent to [facility] mail order pharmacy. The Symbicort is going to cost him 5 537 dollars for 3  but if BQ thinks he should have this, then they will order it. The Airduo cost is 237 for 3 and this is more affordable for them. Please advise.

## 2017-09-01 NOTE — Telephone Encounter (Signed)
232/14 1 puff bid

## 2017-09-01 NOTE — Telephone Encounter (Signed)
Attempted to contact Manuela Schwartz (pt's wife) but unable to reach her. Left message for Manuela Schwartz to call us back x2

## 2017-09-01 NOTE — Telephone Encounter (Signed)
OK to substitute Airduo for Symbicort

## 2017-09-01 NOTE — Telephone Encounter (Signed)
BQ please clarify dosage for Airduo.

## 2017-09-01 NOTE — Telephone Encounter (Signed)
Rx sent to pt's preferred pharmacy of [facility] for pt due to symbicort costing too much for them.  Called pt and left a message on both home and mobile numbers about the Rx being sent in for pt.  Will remove Symbicort off of pt's med list since we are switching to AirDuo instead.  Nothing further needed at this current time.

## 2017-09-01 NOTE — Telephone Encounter (Signed)
Pt wife is returning call. Cb is [phone].

## 2017-09-01 NOTE — Telephone Encounter (Signed)
Manuela Schwartz (pt wife) calling with more information.  States she contacted pharm at [facility] to find out what was alternative for Symbicort and they were told AirDuo and it does come in generic which would make the price much less.  States the active ingredient is not quite the same as Symbicort but just not as good but is close to being the same thing.  CB is [phone] before 4:30, after 4:30 please call [phone].  She wants to know if it will be okay to try this to please send RX today.

## 2017-09-04 ENCOUNTER — Inpatient Hospital Stay (HOSPITAL_COMMUNITY)
Admission: EM | Admit: 2017-09-04 | Discharge: 2017-09-09 | DRG: 481 | Disposition: A | Discharge status: Home Health | Payer: Medicare Other | Attending: Nephrology | Admitting: Nephrology

## 2017-09-04 ENCOUNTER — Emergency Department (HOSPITAL_COMMUNITY): Payer: Medicare Other

## 2017-09-04 ENCOUNTER — Other Ambulatory Visit: Payer: Self-pay

## 2017-09-04 ENCOUNTER — Encounter (HOSPITAL_COMMUNITY): Payer: Self-pay | Admitting: Emergency Medicine

## 2017-09-04 DIAGNOSIS — I97191 Other postprocedural cardiac functional disturbances following other surgery: Secondary | ICD-10-CM | POA: Diagnosis not present

## 2017-09-04 DIAGNOSIS — Z951 Presence of aortocoronary bypass graft: Secondary | ICD-10-CM | POA: Diagnosis not present

## 2017-09-04 DIAGNOSIS — E876 Hypokalemia: Secondary | ICD-10-CM | POA: Diagnosis not present

## 2017-09-04 DIAGNOSIS — Y9389 Activity, other specified: Secondary | ICD-10-CM | POA: Diagnosis not present

## 2017-09-04 DIAGNOSIS — I10 Essential (primary) hypertension: Secondary | ICD-10-CM | POA: Diagnosis present

## 2017-09-04 DIAGNOSIS — S72142A Displaced intertrochanteric fracture of left femur, initial encounter for closed fracture: Principal | ICD-10-CM | POA: Diagnosis present

## 2017-09-04 DIAGNOSIS — S72009A Fracture of unspecified part of neck of unspecified femur, initial encounter for closed fracture: Secondary | ICD-10-CM | POA: Diagnosis present

## 2017-09-04 DIAGNOSIS — E7800 Pure hypercholesterolemia, unspecified: Secondary | ICD-10-CM | POA: Diagnosis not present

## 2017-09-04 DIAGNOSIS — S72002S Fracture of unspecified part of neck of left femur, sequela: Secondary | ICD-10-CM | POA: Diagnosis not present

## 2017-09-04 DIAGNOSIS — Z87891 Personal history of nicotine dependence: Secondary | ICD-10-CM | POA: Diagnosis not present

## 2017-09-04 DIAGNOSIS — R778 Other specified abnormalities of plasma proteins: Secondary | ICD-10-CM | POA: Diagnosis not present

## 2017-09-04 DIAGNOSIS — Z955 Presence of coronary angioplasty implant and graft: Secondary | ICD-10-CM

## 2017-09-04 DIAGNOSIS — J449 Chronic obstructive pulmonary disease, unspecified: Secondary | ICD-10-CM | POA: Diagnosis present

## 2017-09-04 DIAGNOSIS — E669 Obesity, unspecified: Secondary | ICD-10-CM | POA: Diagnosis present

## 2017-09-04 DIAGNOSIS — M4696 Unspecified inflammatory spondylopathy, lumbar region: Secondary | ICD-10-CM | POA: Diagnosis present

## 2017-09-04 DIAGNOSIS — I5030 Unspecified diastolic (congestive) heart failure: Secondary | ICD-10-CM | POA: Diagnosis not present

## 2017-09-04 DIAGNOSIS — I25810 Atherosclerosis of coronary artery bypass graft(s) without angina pectoris: Secondary | ICD-10-CM | POA: Diagnosis present

## 2017-09-04 DIAGNOSIS — I4891 Unspecified atrial fibrillation: Secondary | ICD-10-CM | POA: Diagnosis not present

## 2017-09-04 DIAGNOSIS — R7989 Other specified abnormal findings of blood chemistry: Secondary | ICD-10-CM | POA: Diagnosis not present

## 2017-09-04 DIAGNOSIS — Z91041 Radiographic dye allergy status: Secondary | ICD-10-CM

## 2017-09-04 DIAGNOSIS — J430 Unilateral pulmonary emphysema [MacLeod's syndrome]: Secondary | ICD-10-CM | POA: Diagnosis not present

## 2017-09-04 DIAGNOSIS — R9431 Abnormal electrocardiogram [ECG] [EKG]: Secondary | ICD-10-CM

## 2017-09-04 DIAGNOSIS — S72002A Fracture of unspecified part of neck of left femur, initial encounter for closed fracture: Secondary | ICD-10-CM | POA: Diagnosis not present

## 2017-09-04 DIAGNOSIS — R238 Other skin changes: Secondary | ICD-10-CM | POA: Diagnosis not present

## 2017-09-04 DIAGNOSIS — Z6826 Body mass index (BMI) 26.0-26.9, adult: Secondary | ICD-10-CM | POA: Diagnosis not present

## 2017-09-04 DIAGNOSIS — Z419 Encounter for procedure for purposes other than remedying health state, unspecified: Secondary | ICD-10-CM

## 2017-09-04 DIAGNOSIS — D62 Acute posthemorrhagic anemia: Secondary | ICD-10-CM | POA: Diagnosis not present

## 2017-09-04 DIAGNOSIS — T148XXA Other injury of unspecified body region, initial encounter: Secondary | ICD-10-CM

## 2017-09-04 DIAGNOSIS — Z823 Family history of stroke: Secondary | ICD-10-CM

## 2017-09-04 DIAGNOSIS — Y92009 Unspecified place in unspecified non-institutional (private) residence as the place of occurrence of the external cause: Secondary | ICD-10-CM | POA: Diagnosis not present

## 2017-09-04 DIAGNOSIS — R000 Tachycardia, unspecified: Secondary | ICD-10-CM | POA: Diagnosis not present

## 2017-09-04 DIAGNOSIS — M25552 Pain in left hip: Secondary | ICD-10-CM | POA: Diagnosis not present

## 2017-09-04 DIAGNOSIS — W010XXA Fall on same level from slipping, tripping and stumbling without subsequent striking against object, initial encounter: Secondary | ICD-10-CM | POA: Diagnosis present

## 2017-09-04 DIAGNOSIS — Z7982 Long term (current) use of aspirin: Secondary | ICD-10-CM

## 2017-09-04 DIAGNOSIS — I2510 Atherosclerotic heart disease of native coronary artery without angina pectoris: Secondary | ICD-10-CM | POA: Diagnosis present

## 2017-09-04 DIAGNOSIS — Z7951 Long term (current) use of inhaled steroids: Secondary | ICD-10-CM

## 2017-09-04 DIAGNOSIS — N400 Enlarged prostate without lower urinary tract symptoms: Secondary | ICD-10-CM | POA: Diagnosis present

## 2017-09-04 DIAGNOSIS — Z79899 Other long term (current) drug therapy: Secondary | ICD-10-CM

## 2017-09-04 DIAGNOSIS — Z8249 Family history of ischemic heart disease and other diseases of the circulatory system: Secondary | ICD-10-CM

## 2017-09-04 DIAGNOSIS — S299XXA Unspecified injury of thorax, initial encounter: Secondary | ICD-10-CM | POA: Diagnosis not present

## 2017-09-04 DIAGNOSIS — E785 Hyperlipidemia, unspecified: Secondary | ICD-10-CM | POA: Diagnosis present

## 2017-09-04 DIAGNOSIS — I252 Old myocardial infarction: Secondary | ICD-10-CM | POA: Diagnosis not present

## 2017-09-04 LAB — COMPREHENSIVE METABOLIC PANEL
ALT: 17 U/L (ref 17–63)
ANION GAP: 12 (ref 5–15)
AST: 25 U/L (ref 15–41)
Albumin: 3.5 g/dL (ref 3.5–5.0)
Alkaline Phosphatase: 80 U/L (ref 38–126)
BUN: 17 mg/dL (ref 6–20)
CO2: 24 mmol/L (ref 22–32)
Calcium: 9 mg/dL (ref 8.9–10.3)
Chloride: 102 mmol/L (ref 101–111)
Creatinine, Ser: 1.14 mg/dL (ref 0.61–1.24)
GFR calc non Af Amer: 59 mL/min — ABNORMAL LOW (ref >60)
GLUCOSE: 129 mg/dL — AB (ref 65–99)
POTASSIUM: 3.6 mmol/L (ref 3.5–5.1)
SODIUM: 138 mmol/L (ref 135–145)
TOTAL PROTEIN: 6.1 g/dL — AB (ref 6.5–8.1)
Total Bilirubin: 1.1 mg/dL (ref 0.3–1.2)

## 2017-09-04 LAB — CREATININE, SERUM: CREATININE: 1.04 mg/dL (ref 0.61–1.24)

## 2017-09-04 LAB — CBC WITH DIFFERENTIAL/PLATELET
BASOS ABS: 0 10*3/uL (ref 0.0–0.1)
Basophils Relative: 0 %
Eosinophils Absolute: 0 10*3/uL (ref 0.0–0.7)
Eosinophils Relative: 0 %
HEMATOCRIT: 36.3 % — AB (ref 39.0–52.0)
HEMOGLOBIN: 12.3 g/dL — AB (ref 13.0–17.0)
LYMPHS PCT: 7 %
Lymphs Abs: 0.9 10*3/uL (ref 0.7–4.0)
MCH: 31.2 pg (ref 26.0–34.0)
MCHC: 33.9 g/dL (ref 30.0–36.0)
MCV: 92.1 fL (ref 78.0–100.0)
MONO ABS: 0.5 10*3/uL (ref 0.1–1.0)
Monocytes Relative: 4 %
NEUTROS ABS: 11 10*3/uL — AB (ref 1.7–7.7)
NEUTROS PCT: 89 %
Platelets: 258 10*3/uL (ref 150–400)
RBC: 3.94 MIL/uL — ABNORMAL LOW (ref 4.22–5.81)
RDW: 13.2 % (ref 11.5–15.5)
WBC: 12.5 10*3/uL — ABNORMAL HIGH (ref 4.0–10.5)

## 2017-09-04 LAB — CBC
HCT: 33.1 % — ABNORMAL LOW (ref 39.0–52.0)
Hemoglobin: 11.3 g/dL — ABNORMAL LOW (ref 13.0–17.0)
MCH: 31.5 pg (ref 26.0–34.0)
MCHC: 34.1 g/dL (ref 30.0–36.0)
MCV: 92.2 fL (ref 78.0–100.0)
PLATELETS: 255 10*3/uL (ref 150–400)
RBC: 3.59 MIL/uL — AB (ref 4.22–5.81)
RDW: 13.1 % (ref 11.5–15.5)
WBC: 10.8 10*3/uL — AB (ref 4.0–10.5)

## 2017-09-04 LAB — PROTIME-INR
INR: 1.05
Prothrombin Time: 13.7 seconds (ref 11.4–15.2)

## 2017-09-04 LAB — ABO/RH: ABO/RH(D): A POS

## 2017-09-04 MED ORDER — METHOCARBAMOL 1000 MG/10ML IJ SOLN
500.0000 mg | Freq: Four times a day (QID) | INTRAVENOUS | Status: DC | PRN
  Filled 2017-09-04: qty 5

## 2017-09-04 MED ORDER — METOPROLOL SUCCINATE ER 50 MG PO TB24
50.0000 mg | ORAL_TABLET | Freq: Every day | ORAL | Status: DC
  Administered 2017-09-04 – 2017-09-07 (×2): 50 mg via ORAL
  Filled 2017-09-04 (×4): qty 1

## 2017-09-04 MED ORDER — METHOCARBAMOL 500 MG PO TABS
500.0000 mg | ORAL_TABLET | Freq: Four times a day (QID) | ORAL | Status: DC | PRN
  Administered 2017-09-06 – 2017-09-09 (×6): 500 mg via ORAL
  Filled 2017-09-04 (×6): qty 1

## 2017-09-04 MED ORDER — TAMSULOSIN HCL 0.4 MG PO CAPS
0.4000 mg | ORAL_CAPSULE | Freq: Every day | ORAL | Status: DC
  Administered 2017-09-04 – 2017-09-08 (×5): 0.4 mg via ORAL
  Filled 2017-09-04 (×5): qty 1

## 2017-09-04 MED ORDER — MOMETASONE FURO-FORMOTEROL FUM 200-5 MCG/ACT IN AERO
2.0000 | INHALATION_SPRAY | Freq: Two times a day (BID) | RESPIRATORY_TRACT | Status: DC
  Administered 2017-09-05 – 2017-09-09 (×8): 2 via RESPIRATORY_TRACT
  Filled 2017-09-04 (×2): qty 8.8

## 2017-09-04 MED ORDER — HYDROCODONE-ACETAMINOPHEN 5-325 MG PO TABS
1.0000 | ORAL_TABLET | Freq: Four times a day (QID) | ORAL | Status: DC | PRN
  Administered 2017-09-06: 2 via ORAL
  Administered 2017-09-06: 1 via ORAL
  Administered 2017-09-07 (×2): 2 via ORAL
  Administered 2017-09-08 (×2): 1 via ORAL
  Administered 2017-09-09: 2 via ORAL
  Administered 2017-09-09: 1 via ORAL
  Filled 2017-09-04: qty 2
  Filled 2017-09-04: qty 1
  Filled 2017-09-04: qty 2
  Filled 2017-09-04: qty 1
  Filled 2017-09-04 (×3): qty 2
  Filled 2017-09-04: qty 1
  Filled 2017-09-04: qty 2
  Filled 2017-09-04: qty 1

## 2017-09-04 MED ORDER — SODIUM CHLORIDE 0.9 % IV BOLUS (SEPSIS)
500.0000 mL | Freq: Once | INTRAVENOUS | Status: AC
  Administered 2017-09-04: 500 mL via INTRAVENOUS

## 2017-09-04 MED ORDER — SODIUM CHLORIDE 0.9 % IV SOLN
Freq: Once | INTRAVENOUS | Status: AC
  Administered 2017-09-04: 14:00:00 via INTRAVENOUS

## 2017-09-04 MED ORDER — NITROGLYCERIN 0.4 MG SL SUBL: 0.4000 mg | SUBLINGUAL_TABLET | SUBLINGUAL | Status: DC | PRN

## 2017-09-04 MED ORDER — POLYETHYLENE GLYCOL 3350 17 G PO PACK: 17.0000 g | PACK | Freq: Every day | ORAL | Status: DC | PRN

## 2017-09-04 MED ORDER — ALBUTEROL SULFATE HFA 108 (90 BASE) MCG/ACT IN AERS: 2.0000 | INHALATION_SPRAY | RESPIRATORY_TRACT | Status: DC | PRN

## 2017-09-04 MED ORDER — MORPHINE SULFATE (PF) 4 MG/ML IV SOLN
0.5000 mg | INTRAVENOUS | Status: DC | PRN
  Administered 2017-09-04 – 2017-09-06 (×3): 0.52 mg via INTRAVENOUS
  Filled 2017-09-04 (×3): qty 1

## 2017-09-04 MED ORDER — SIMVASTATIN 20 MG PO TABS
20.0000 mg | ORAL_TABLET | Freq: Every day | ORAL | Status: DC
  Administered 2017-09-04 – 2017-09-08 (×5): 20 mg via ORAL
  Filled 2017-09-04 (×5): qty 1

## 2017-09-04 MED ORDER — ENOXAPARIN SODIUM 40 MG/0.4ML ~~LOC~~ SOLN
40.0000 mg | SUBCUTANEOUS | Status: DC
  Administered 2017-09-04 – 2017-09-05 (×2): 40 mg via SUBCUTANEOUS
  Filled 2017-09-04 (×2): qty 0.4

## 2017-09-04 MED ORDER — BISACODYL 5 MG PO TBEC: 5.0000 mg | DELAYED_RELEASE_TABLET | Freq: Every day | ORAL | Status: DC | PRN

## 2017-09-04 MED ORDER — ALBUTEROL SULFATE (2.5 MG/3ML) 0.083% IN NEBU: 2.5000 mg | INHALATION_SOLUTION | RESPIRATORY_TRACT | Status: DC | PRN

## 2017-09-04 NOTE — ED Provider Notes (Signed)
[facility] EMERGENCY DEPARTMENT Provider Note   CSN: [id number] Arrival date & time: 09/04/17  4431     History   Chief Complaint Chief Complaint  Patient presents with  . Hip Pain  . Fall    HPI Larry Briggs is a 82 y.o. male.  Chief complaint is fall, hip pain.  HPI 82 year old male.  Lives at home.  Awakened this morning and was preparing to have a [employer] party.  He got his feet caught in his pants and fell and has a left hip deformity with pain.  He cannot stand, or even attempt to bear weight.  Brought by ambulance.  Has obvious rotation and shortening of his left lower extremity  History of coronary artery disease, high cholesterol, hypertension, COPD, uses inhalers, no oxygen.  Functional at home.  Independent.  Past Medical History:  Diagnosis Date  . CAD (coronary artery disease)    Remote MI in 1980. CABG 1995. Stent to left main in 2004. Last cath in April of 2012. EF 50%; patent LIMA to DX/LAD with collateralization of the distal right, patent SVG to OM, occluded SVG to distal RCA, and patent stent to LAD  . History of heart attack 1980  . Hypercholesteremia   . Hypertension   . Increased glucose level   . Obesity     Patient Active Problem List   Diagnosis Date Noted  . Cough 04/22/2016  . COPD (chronic obstructive pulmonary disease) ([place]) 04/22/2016  . Abnormal EKG 05/10/2011  . Hypertension   . Arteriosclerotic cardiovascular disease (ASCVD)   . Hypercholesteremia   . Obesity   . Ischemic heart disease   . Increased glucose level     Past Surgical History:  Procedure Laterality Date  . CARDIAC CATHETERIZATION  1993  . CARDIAC CATHETERIZATION  April 2012   Mild reduction if EF at 50%. Patent LIMA to DX/LAD with collateralization to distal RCA, patent SVG to OM and occluded SVG to distal right and patent stent to LAD/Left main;  . CORONARY ARTERY BYPASS GRAFT  05/1994   x5 LIMA to LAD & DX, SVG to LCX, SVG to RCA  .  CORONARY STENT PLACEMENT  2004   Stent to the L main       Home Medications    Prior to Admission medications   Medication Sig Start Date End Date Taking? Authorizing Provider  Ascorbic Acid (VITAMIN C) 500 MG CAPS Take 1 capsule by mouth daily. Marlow Baars )     [provider]  aspirin 81 MG EC tablet Take 81 mg by mouth daily.     [provider]  chlorthalidone (HYGROTON) 25 MG tablet Take 1 tablet (25 mg total) by mouth daily. 07/09/16 10/07/16  [place]ique, Peter M, MD  chlorthalidone (HYGROTON) 25 MG tablet Take 25 mg daily by mouth.    [provider]  fish oil-omega-3 fatty acids 1000 MG capsule Take 1 g by mouth daily.     [provider]  Fluticasone-Salmeterol (AIRDUO RESPICLICK 540/08) 676-19 MCG/ACT AEPB Inhale 1 puff into the lungs 2 (two) times daily. 09/01/17   Juanito Doom, MD  irbesartan (AVAPRO) 300 MG tablet Take 1 tablet (300 mg total) by mouth daily. 03/04/17   Martinique, Peter M, MD  metoprolol succinate (TOPROL-XL) 50 MG 24 hr tablet TAKE 1 TABLET EVERY DAY 05/09/17   Martin[place], Peter M, MD  multivitamin-iron-minerals-folic acid (CENTRUM) chewable tablet Chew 1 tablet by mouth daily.    [provider]  nitroGLYCERIN (NITROSTAT) 0.4 MG SL tablet Place 1 tablet (0.4 mg total) under the tongue as needed. 12/17/16   Martin[place], Peter M, MD  simvastatin (ZOCOR) 20 MG tablet Take 1 tablet (20 mg total) by mouth at bedtime. 08/09/17   [place], Peter M, MD  Spacer/Aero-Holding Chambers (AEROCHAMBER MV) inhaler Use as instructed 02/21/17   Juanito Doom, MD  VENTOLIN HFA 108 [id number] Base) MCG/ACT inhaler Inhale 2 puffs into the lungs every 4 (four) hours as needed for wheezing or shortness of breath. 12/30/16   Marshell Garfinkel, MD    Family History Family History  Problem Relation Age of Onset  . Heart attack Father   . Hypertension Father   . Stroke Mother     Social History Social History   Tobacco Use  . Smoking status: Former Smoker     Packs/day: 1.00    Years: 40.00    Pack years: 40.00    Types: Cigarettes    Last attempt to quit: 07/02/1994    Years since quitting: 23.1  . Smokeless tobacco: Never Used  Substance Use Topics  . Alcohol use: No  . Drug use: No     Allergies   Iodinated diagnostic agents   Review of Systems Review of Systems  Constitutional: Negative for appetite change, chills, diaphoresis, fatigue and fever.  HENT: Negative for mouth sores, sore throat and trouble swallowing.   Eyes: Negative for visual disturbance.  Respiratory: Negative for cough, chest tightness, shortness of breath and wheezing.   Cardiovascular: Negative for chest pain.  Gastrointestinal: Negative for abdominal distention, abdominal pain, diarrhea, nausea and vomiting.  Endocrine: Negative for polydipsia, polyphagia and polyuria.  Genitourinary: Negative for dysuria, frequency and hematuria.  Musculoskeletal: Negative for gait problem.       Left hip pain  Skin: Negative for color change, pallor and rash.  Neurological: Negative for dizziness, syncope, light-headedness and headaches.  Hematological: Does not bruise/bleed easily.  Psychiatric/Behavioral: Negative for behavioral problems and confusion.     Physical Exam Updated Vital Signs BP 129/71   Pulse 91   Temp 98.5 F (36.9 C) (Oral)   Resp 18   Ht 5\' 10"  (1.778 m)   Wt 81.6 kg (180 lb)   SpO2 99%   BMI 25.83 kg/m   Physical Exam  Constitutional: He is oriented to person, place, and time. He appears well-developed and well-nourished. No distress.  HENT:  Head: Normocephalic.  Eyes: Conjunctivae are normal. Pupils are equal, round, and reactive to light. No scleral icterus.  Neck: Normal range of motion. Neck supple. No thyromegaly present.  Cardiovascular: Normal rate and regular rhythm. Exam reveals no gallop and no friction rub.  No murmur heard. Pulmonary/Chest: Effort normal and breath sounds normal. No respiratory distress. He has no wheezes.  He has no rales.  Abdominal: Soft. Bowel sounds are normal. He exhibits no distension. There is no tenderness. There is no rebound.  Musculoskeletal: Normal range of motion.  Shortening and rotation of the left hip  Neurological: He is alert and oriented to person, place, and time.  Skin: Skin is warm and dry. No rash noted.  Psychiatric: He has a normal mood and affect. His behavior is normal.     ED Treatments / Results  Labs (all labs ordered are listed, but only abnormal results are displayed) Labs Reviewed  COMPREHENSIVE METABOLIC PANEL - Abnormal; Notable for the following components:      Result Value   Glucose, Bld 129 (*)    Total Protein  6.1 (*)    GFR calc non Af Amer 59 (*)    All other components within normal limits  CBC WITH DIFFERENTIAL/PLATELET - Abnormal; Notable for the following components:   WBC 12.5 (*)    RBC 3.94 (*)    Hemoglobin 12.3 (*)    HCT 36.3 (*)    Neutro Abs 11.0 (*)    All other components within normal limits  URINE CULTURE  PROTIME-INR  URINALYSIS, ROUTINE W REFLEX MICROSCOPIC    EKG  EKG Interpretation None       Radiology Dg Chest 1 View  Result Date: 09/04/2017 CLINICAL DATA:  [place] on the left side. EXAM: CHEST 1 VIEW COMPARISON:  02/23/2016. FINDINGS: Normal sized heart. Clear lungs. The lungs remain hyperexpanded. Stable post CABG changes and thoracic spine degenerative changes. IMPRESSION: No acute abnormality.  Stable changes of COPD. Electronically Signed   By: Claudie Revering M.D.   On: 09/04/2017 10:17   Dg Hip Unilat With Pelvis 2-3 Views Left  Result Date: 09/04/2017 CLINICAL DATA:  Left hip pain post fall. EXAM: DG HIP (WITH OR WITHOUT PELVIS) 2-3V LEFT COMPARISON:  None. FINDINGS: There is severely comminuted oblique intertrochanteric fracture of the left femur with superior dislocation of the distal fracture fragment and butterfly fragment containing the lesser trochanter. The glenohumeral joint is preserved. IMPRESSION:  Severely comminuted oblique intertrochanteric fracture of the left femur with superior dislocation of the distal fracture fragment and butterfly fragment containing the lesser trochanter. Electronically Signed   By: Fidela Salisbury M.D.   On: 09/04/2017 10:18    Procedures Procedures (including critical care time)  Medications Ordered in ED Medications  0.9 %  sodium chloride infusion (not administered)  sodium chloride 0.9 % bolus 500 mL (500 mLs Intravenous New Bag/Given 09/04/17 1410)     Initial Impression / Assessment and Plan / ED Course  I have reviewed the triage vital signs and the nursing notes.  Pertinent labs & imaging results that were available during my care of the patient were reviewed by me and considered in my medical decision making (see chart for details).     Needed intertrochanteric left hip fracture.  Patient and family prefer [facility] orthopedics.  Spoke with Dr. Fredonia Highland.  He is planning repair for mid day tomorrow, Monday.  Patient can eat until midnight and be n.p.o.  Preop labs, urine, and EKG are obtained.  Urine pending.  Will discuss with unassigned medicine regarding admission for care.  Final Clinical Impressions(s) / ED Diagnoses   Final diagnoses:  Closed fracture of left hip, initial encounter [facility])    ED Discharge Orders    None       Tanna Furry, MD 09/04/17 1512

## 2017-09-04 NOTE — H&P (Signed)
History and Physical:   Larry Briggs   DJS:[id number] DOB: 1935-09-01 DOA: 09/04/2017  Referring MD/provider: Dr. Jeneen Rinks PCP: [name], MD   Patient coming from: Home  Chief Complaint: Hip fracture   History of Present Illness:   Larry Briggs is an 82 y.o. male with past medical history significant for coronary artery disease, COPD, chronic lower back pain secondary to arthritis which patient treats with Tylenol alone who was in his usual state of good health until this morning when he was putting on his pants and missed a pant leg and fell on the floor. Patient notes he heard a crack as he landed on the floor and had pain in his left hip. Patient denies any chest pain shortness of breath either before or since his fall. Patient denies any palpitations dizziness presyncope or syncope.  ED Course:  The patient was noted to have a left hip fracture. Orthopedics was called and they are planning on taking him to surgery tomorrow at 1.   ROS:   ROS    Review of Systems: General: No fever, chills, weight changes Skin: No rashes, lesions, wounds Eyes: no discharge, redness, pain Respiratory: No cough,, shortness of breath, hemoptysis Cardiovascular: No palpitations, chest pain GI: No nausea, vomiting, diarrhea, constipation GU: No dysuria, increased frequency CNS: No numbness, dizziness, headache     Past Medical History:   Past Medical History:  Diagnosis Date  . CAD (coronary artery disease)    Remote MI in 1980. CABG 1995. Stent to left main in 2004. Last cath in April of 2012. EF 50%; patent LIMA to DX/LAD with collateralization of the distal right, patent SVG to OM, occluded SVG to distal RCA, and patent stent to LAD  . History of heart attack 1980  . Hypercholesteremia   . Hypertension   . Increased glucose level   . Obesity     Past Surgical History:   Past Surgical History:  Procedure Laterality Date  . CARDIAC CATHETERIZATION  1993  .  CARDIAC CATHETERIZATION  April 2012   Mild reduction if EF at 50%. Patent LIMA to DX/LAD with collateralization to distal RCA, patent SVG to OM and occluded SVG to distal right and patent stent to LAD/Left main;  . CORONARY ARTERY BYPASS GRAFT  05/1994   x5 LIMA to LAD & DX, SVG to LCX, SVG to RCA  . CORONARY STENT PLACEMENT  2004   Stent to the L main    Social History:   Social History   Socioeconomic History  . Marital status: Married    Spouse name: Not on file  . Number of children: Not on file  . Years of education: Not on file  . Highest education level: Not on file  Social Needs  . Financial resource strain: Not on file  . Food insecurity - worry: Not on file  . Food insecurity - inability: Not on file  . Transportation needs - medical: Not on file  . Transportation needs - non-medical: Not on file  Occupational History  . Not on file  Tobacco Use  . Smoking status: Former Smoker    Packs/day: 1.00    Years: 40.00    Pack years: 40.00    Types: Cigarettes    Last attempt to quit: 07/02/1994    Years since quitting: 23.1  . Smokeless tobacco: Never Used  Substance and Sexual Activity  . Alcohol use: No  . Drug use: No  . Sexual activity: Yes  Other  Topics Concern  . Not on file  Social History Narrative  . Not on file    Allergies   Iodinated diagnostic agents  Family history:   Family History  Problem Relation Age of Onset  . Heart attack Father   . Hypertension Father   . Stroke Mother     Current Medications:   Prior to Admission medications   Medication Sig Start Date End Date Taking? Authorizing Provider  aspirin (ECOTRIN LOW STRENGTH) 81 MG EC tablet Take 81 mg by mouth at bedtime. Swallow whole.   Yes [provider]  Bioflavonoid Products (ESTER-C) 500-550 MG TABS Take 1 tablet by mouth daily.   Yes [provider]  budesonide-formoterol (SYMBICORT) 160-4.5 MCG/ACT inhaler Inhale 2 puffs into the lungs 2 (two) times  daily.   Yes [provider]  chlorthalidone (HYGROTON) 25 MG tablet Take 1 tablet (25 mg total) by mouth daily. Patient taking differently: Take 25 mg by mouth at bedtime.  07/09/16 09/04/17 Yes Martinique, Peter M, MD  fish oil-omega-3 fatty acids 1000 MG capsule Take 1 g by mouth daily.    Yes [provider]  irbesartan (AVAPRO) 300 MG tablet Take 1 tablet (300 mg total) by mouth daily. 03/04/17  Yes [place], Peter M, MD  metoprolol succinate (TOPROL-XL) 50 MG 24 hr tablet TAKE 1 TABLET EVERY DAY Patient taking differently: Take 50 mg by mouth at bedtime 05/09/17  Yes [place], Peter M, MD  multivitamin-iron-minerals-folic acid (CENTRUM) chewable tablet Chew 1 tablet by mouth daily.   Yes [provider]  nitroGLYCERIN (NITROSTAT) 0.4 MG SL tablet Place 1 tablet (0.4 mg total) under the tongue as needed. Patient taking differently: Place 0.4 mg under the tongue every 5 (five) minutes as needed for chest pain.  12/17/16  Yes [place]ique, Peter M, MD  simvastatin (ZOCOR) 20 MG tablet Take 1 tablet (20 mg total) by mouth at bedtime. 08/09/17  Yes [place], Peter M, MD  Spacer/Aero-Holding Chambers (AEROCHAMBER MV) inhaler Use as instructed 02/21/17  Yes Juanito Doom, MD  tamsulosin (FLOMAX) 0.4 MG CAPS capsule Take 0.4 mg by mouth daily after supper.   Yes [provider]  VENTOLIN HFA 108 (90 Base) MCG/ACT inhaler Inhale 2 puffs into the lungs every 4 (four) hours as needed for wheezing or shortness of breath. 12/30/16  Yes Mannam, Praveen, MD  Fluticasone-Salmeterol (AIRDUO RESPICLICK 952/84) 132-44 MCG/ACT AEPB Inhale 1 puff into the lungs 2 (two) times daily. 09/01/17   Juanito Doom, MD    Physical Exam:   Vitals:   09/04/17 1300 09/04/17 1330 09/04/17 1400 09/04/17 1415  BP: 120/62 124/62 119/61 129/71  Pulse: 92 95 98 91  Resp: 20 18 20 18   Temp:      TempSrc:      SpO2: 98% 100% 100% 99%  Weight:      Height:         Physical Exam: Blood pressure 129/71,  pulse 91, temperature 98.5 F (36.9 C), temperature source Oral, resp. rate 18, height 5\' 10"  (1.778 m), weight 81.6 kg (180 lb), SpO2 99 %.  Gen: Calm well-appearing man sitting on stretcher holding his left hip in an awkward position Eyes: Sclerae anicteric. Conjunctiva mildly injected. Neck: Supple, no jugular venous distention. Chest: Poor air entry bilaterally with no adventitious sounds.  CV: Distant, regular, no audible murmurs. Abdomen: NABS, soft, nondistended, nontender. No tenderness to light or deep palpation. No rebound, no guarding. Extremities: No edema.  Skin: Warm and dry. No rashes,  lesions or wounds. Neuro: Alert and oriented times 3; grossly nonfocal. Psych: Patient is cooperative, logical and coherent with appropriate mood and affect.   Data Review:    Labs: Basic Metabolic Panel: Recent Labs  Lab 09/04/17 1047  NA 138  K 3.6  CL 102  CO2 24  GLUCOSE 129*  BUN 17  CREATININE 1.14  CALCIUM 9.0   Liver Function Tests: Recent Labs  Lab 09/04/17 1047  AST 25  ALT 17  ALKPHOS 80  BILITOT 1.1  PROT 6.1*  ALBUMIN 3.5   No results for input(s): LIPASE, AMYLASE in the last 168 hours. No results for input(s): AMMONIA in the last 168 hours. CBC: Recent Labs  Lab 09/04/17 1047  WBC 12.5*  NEUTROABS 11.0*  HGB 12.3*  HCT 36.3*  MCV 92.1  PLT 258   Cardiac Enzymes: No results for input(s): CKTOTAL, CKMB, CKMBINDEX, TROPONINI in the last 168 hours.  BNP (last 3 results) No results for input(s): PROBNP in the last 8760 hours. CBG: No results for input(s): GLUCAP in the last 168 hours.  Urinalysis No results found for: COLORURINE, APPEARANCEUR, LABSPEC, PHURINE, GLUCOSEU, HGBUR, BILIRUBINUR, KETONESUR, PROTEINUR, UROBILINOGEN, NITRITE, LEUKOCYTESUR    Radiographic Studies: Dg Chest 1 View  Result Date: 09/04/2017 CLINICAL DATA:  [place] on the left side. EXAM: CHEST 1 VIEW COMPARISON:  02/23/2016. FINDINGS: Normal sized heart. Clear lungs. The  lungs remain hyperexpanded. Stable post CABG changes and thoracic spine degenerative changes. IMPRESSION: No acute abnormality.  Stable changes of COPD. Electronically Signed   By: Claudie Revering M.D.   On: 09/04/2017 10:17   Dg Hip Unilat With Pelvis 2-3 Views Left  Result Date: 09/04/2017 CLINICAL DATA:  Left hip pain post fall. EXAM: DG HIP (WITH OR WITHOUT PELVIS) 2-3V LEFT COMPARISON:  None. FINDINGS: There is severely comminuted oblique intertrochanteric fracture of the left femur with superior dislocation of the distal fracture fragment and butterfly fragment containing the lesser trochanter. The glenohumeral joint is preserved. IMPRESSION: Severely comminuted oblique intertrochanteric fracture of the left femur with superior dislocation of the distal fracture fragment and butterfly fragment containing the lesser trochanter. Electronically Signed   By: Fidela Salisbury M.D.   On: 09/04/2017 10:18    EKG: Independently reviewed. Sinus rhythm at 80. [place] first-degree heart block. Unspecified intraventricular conduction delay. No acute ST-T wave changes.   Assessment/Plan:   Active Problems:   Hypertension   Arteriosclerotic cardiovascular disease (ASCVD)   Hypercholesteremia   COPD (chronic obstructive pulmonary disease) (HCC)   Hip fracture ([place])  HIP FRACTURE Patient with left intertrochanteric hip fracture. He is for surgery tomorrow per Dr. Percell Miller. Will hold irbesartan and chlorthalidone in the morning, these can be restarted after surgery. Will also hold patient's aspirin starting today. Patient will receive 1 dose of Lovenox at 4 PM today, next dose will not be due until 4 PM tomorrow. This can be discontinued if necessary per orthopedic recommendations.  LBP  Patient only uses Tylenol for management of his lower back pain. However he states it's very uncomfortable for him to lay in a flat position without being able to move due to his hip fracture. Will prescribe  methocarbamol as well as hydrocodone and when necessary morphine as needed.  HTN Hold chlorthalidone and irbesartan prior to surgery, these will need to be restarted after surgery. Continue metoprolol  CAD Hold aspirin tonight given surgery tomorrow  COPD Continue usual home inhalers.  BPH Continue tamsulosin   Other information:   DVT prophylaxis: Lovenox ordered.  Code Status: Full code. Family Communication: Patient's wife was at bedside Disposition Plan: Home versus rehabilitation Consults called: Orthopedics Admission status: Inpatient   The medical decision making is of moderate complexity, therefore this is a level 2 visit.  Dewaine Oats Derek Jack [facility] Pager [phone] Cell: [phone]   If 7PM-7AM, please contact night-coverage [email or web address] Password [facility] 09/04/2017, 5:57 PM

## 2017-09-04 NOTE — ED Notes (Signed)
Admitting at bedside 

## 2017-09-04 NOTE — ED Notes (Signed)
Doctor at bedside.

## 2017-09-04 NOTE — ED Triage Notes (Signed)
Arrived via EMS patient attempting to put pants on and fell on to left side.  Pain left hip with shortening pedal pulse +1 full sensation able to move all toes.

## 2017-09-04 NOTE — Progress Notes (Signed)
Orthopedic Tech Progress Note Patient Details:  Larry Briggs 07-05-1936 [id number] Patient exceed age restrictions for overhead frame. Patient ID: Larry Briggs, male   DOB: 15-Oct-1935, 82 y.o.   MRN: [id number]   Larry Briggs 09/04/2017, 5:51 PM

## 2017-09-05 ENCOUNTER — Inpatient Hospital Stay (HOSPITAL_COMMUNITY): Payer: Medicare Other

## 2017-09-05 ENCOUNTER — Encounter (HOSPITAL_COMMUNITY): Payer: Self-pay

## 2017-09-05 ENCOUNTER — Encounter (HOSPITAL_COMMUNITY)
Admission: EM | Disposition: A | Discharge status: Home Health | Payer: Self-pay | Source: Home / Self Care | Attending: Nephrology

## 2017-09-05 ENCOUNTER — Inpatient Hospital Stay (HOSPITAL_COMMUNITY): Payer: Medicare Other | Admitting: Certified Registered Nurse Anesthetist

## 2017-09-05 HISTORY — PX: INTRAMEDULLARY (IM) NAIL INTERTROCHANTERIC: SHX5875

## 2017-09-05 LAB — URINALYSIS, ROUTINE W REFLEX MICROSCOPIC
BILIRUBIN URINE: NEGATIVE
Glucose, UA: NEGATIVE mg/dL
Hgb urine dipstick: NEGATIVE
KETONES UR: 20 mg/dL — AB
Leukocytes, UA: NEGATIVE
NITRITE: NEGATIVE
Protein, ur: NEGATIVE mg/dL
Specific Gravity, Urine: 1.019 (ref 1.005–1.030)
pH: 5 (ref 5.0–8.0)

## 2017-09-05 LAB — CBC
HEMATOCRIT: 28.7 % — AB (ref 39.0–52.0)
Hemoglobin: 9.8 g/dL — ABNORMAL LOW (ref 13.0–17.0)
MCH: 31.5 pg (ref 26.0–34.0)
MCHC: 34.1 g/dL (ref 30.0–36.0)
MCV: 92.3 fL (ref 78.0–100.0)
PLATELETS: 204 10*3/uL (ref 150–400)
RBC: 3.11 MIL/uL — ABNORMAL LOW (ref 4.22–5.81)
RDW: 13.3 % (ref 11.5–15.5)
WBC: 8.1 10*3/uL (ref 4.0–10.5)

## 2017-09-05 LAB — BASIC METABOLIC PANEL
ANION GAP: 10 (ref 5–15)
BUN: 19 mg/dL (ref 6–20)
CALCIUM: 8.1 mg/dL — AB (ref 8.9–10.3)
CO2: 23 mmol/L (ref 22–32)
CREATININE: 1.12 mg/dL (ref 0.61–1.24)
Chloride: 105 mmol/L (ref 101–111)
GFR calc Af Amer: >60 mL/min (ref >60)
GLUCOSE: 151 mg/dL — AB (ref 65–99)
Potassium: 4 mmol/L (ref 3.5–5.1)
Sodium: 138 mmol/L (ref 135–145)

## 2017-09-05 LAB — SURGICAL PCR SCREEN
MRSA, PCR: NEGATIVE
Staphylococcus aureus: NEGATIVE

## 2017-09-05 SURGERY — FIXATION, FRACTURE, INTERTROCHANTERIC, WITH INTRAMEDULLARY ROD
Anesthesia: General | Laterality: Left

## 2017-09-05 MED ORDER — ONDANSETRON HCL 4 MG/2ML IJ SOLN
INTRAMUSCULAR | Status: AC
  Filled 2017-09-05: qty 2

## 2017-09-05 MED ORDER — MIDAZOLAM HCL 2 MG/2ML IJ SOLN
INTRAMUSCULAR | Status: AC
  Filled 2017-09-05: qty 2

## 2017-09-05 MED ORDER — ASPIRIN EC 325 MG PO TBEC
325.0000 mg | DELAYED_RELEASE_TABLET | Freq: Every day | ORAL | Status: DC
  Administered 2017-09-06 – 2017-09-09 (×4): 325 mg via ORAL
  Filled 2017-09-05 (×4): qty 1

## 2017-09-05 MED ORDER — PHENOL 1.4 % MT LIQD: 1.0000 | OROMUCOSAL | Status: DC | PRN

## 2017-09-05 MED ORDER — ALBUMIN HUMAN 5 % IV SOLN
INTRAVENOUS | Status: DC | PRN
  Administered 2017-09-05: 16:00:00 via INTRAVENOUS

## 2017-09-05 MED ORDER — LIDOCAINE HCL (CARDIAC) 20 MG/ML IV SOLN
INTRAVENOUS | Status: DC | PRN
  Administered 2017-09-05: 80 mg via INTRAVENOUS

## 2017-09-05 MED ORDER — POVIDONE-IODINE 10 % EX SWAB
2.0000 "application " | Freq: Once | CUTANEOUS | Status: AC
  Administered 2017-09-05: 2 via TOPICAL

## 2017-09-05 MED ORDER — CHLORHEXIDINE GLUCONATE 4 % EX LIQD
60.0000 mL | Freq: Once | CUTANEOUS | Status: AC
  Administered 2017-09-05: 4 via TOPICAL
  Filled 2017-09-05: qty 15

## 2017-09-05 MED ORDER — ACETAMINOPHEN 500 MG PO TABS: 1000.0000 mg | ORAL_TABLET | Freq: Once | ORAL | Status: DC

## 2017-09-05 MED ORDER — PHENYLEPHRINE HCL 10 MG/ML IJ SOLN
INTRAMUSCULAR | Status: DC | PRN
  Administered 2017-09-05: 200 ug via INTRAVENOUS
  Administered 2017-09-05: 160 ug via INTRAVENOUS
  Administered 2017-09-05: 240 ug via INTRAVENOUS
  Administered 2017-09-05: 200 ug via INTRAVENOUS

## 2017-09-05 MED ORDER — PROPOFOL 10 MG/ML IV BOLUS
INTRAVENOUS | Status: AC
  Filled 2017-09-05: qty 20

## 2017-09-05 MED ORDER — CEFAZOLIN SODIUM-DEXTROSE 2-4 GM/100ML-% IV SOLN
2.0000 g | INTRAVENOUS | Status: AC
  Administered 2017-09-05: 2 g via INTRAVENOUS
  Filled 2017-09-05 (×2): qty 100

## 2017-09-05 MED ORDER — ROCURONIUM BROMIDE 10 MG/ML (PF) SYRINGE
PREFILLED_SYRINGE | INTRAVENOUS | Status: AC
  Filled 2017-09-05: qty 5

## 2017-09-05 MED ORDER — MENTHOL 3 MG MT LOZG: 1.0000 | LOZENGE | OROMUCOSAL | Status: DC | PRN

## 2017-09-05 MED ORDER — FENTANYL CITRATE (PF) 100 MCG/2ML IJ SOLN
25.0000 ug | INTRAMUSCULAR | Status: DC | PRN
  Administered 2017-09-05: 50 ug via INTRAVENOUS

## 2017-09-05 MED ORDER — ROCURONIUM BROMIDE 100 MG/10ML IV SOLN
INTRAVENOUS | Status: DC | PRN
  Administered 2017-09-05: 60 mg via INTRAVENOUS

## 2017-09-05 MED ORDER — SUCCINYLCHOLINE CHLORIDE 200 MG/10ML IV SOSY
PREFILLED_SYRINGE | INTRAVENOUS | Status: AC
  Filled 2017-09-05: qty 10

## 2017-09-05 MED ORDER — BISACODYL 10 MG RE SUPP: 10.0000 mg | Freq: Every day | RECTAL | Status: DC | PRN

## 2017-09-05 MED ORDER — PHENYLEPHRINE 40 MCG/ML (10ML) SYRINGE FOR IV PUSH (FOR BLOOD PRESSURE SUPPORT)
PREFILLED_SYRINGE | INTRAVENOUS | Status: AC
  Filled 2017-09-05: qty 30

## 2017-09-05 MED ORDER — POLYETHYLENE GLYCOL 3350 17 G PO PACK: 17.0000 g | PACK | Freq: Every day | ORAL | Status: DC | PRN

## 2017-09-05 MED ORDER — FENTANYL CITRATE (PF) 250 MCG/5ML IJ SOLN
INTRAMUSCULAR | Status: AC
  Filled 2017-09-05: qty 5

## 2017-09-05 MED ORDER — PROPOFOL 10 MG/ML IV BOLUS
INTRAVENOUS | Status: DC | PRN
  Administered 2017-09-05: 120 mg via INTRAVENOUS

## 2017-09-05 MED ORDER — SUGAMMADEX SODIUM 200 MG/2ML IV SOLN
INTRAVENOUS | Status: AC
  Filled 2017-09-05: qty 2

## 2017-09-05 MED ORDER — LIDOCAINE 2% (20 MG/ML) 5 ML SYRINGE
INTRAMUSCULAR | Status: AC
  Filled 2017-09-05: qty 5

## 2017-09-05 MED ORDER — FENTANYL CITRATE (PF) 250 MCG/5ML IJ SOLN
INTRAMUSCULAR | Status: DC | PRN
  Administered 2017-09-05 (×3): 50 ug via INTRAVENOUS

## 2017-09-05 MED ORDER — DEXAMETHASONE SODIUM PHOSPHATE 4 MG/ML IJ SOLN
INTRAMUSCULAR | Status: DC | PRN
  Administered 2017-09-05: 10 mg via INTRAVENOUS

## 2017-09-05 MED ORDER — DEXAMETHASONE SODIUM PHOSPHATE 10 MG/ML IJ SOLN
INTRAMUSCULAR | Status: AC
  Filled 2017-09-05: qty 1

## 2017-09-05 MED ORDER — SENNA 8.6 MG PO TABS
1.0000 | ORAL_TABLET | Freq: Two times a day (BID) | ORAL | Status: DC
  Administered 2017-09-05 – 2017-09-09 (×6): 8.6 mg via ORAL
  Filled 2017-09-05 (×8): qty 1

## 2017-09-05 MED ORDER — PROMETHAZINE HCL 25 MG/ML IJ SOLN: 6.2500 mg | INTRAMUSCULAR | Status: DC | PRN

## 2017-09-05 MED ORDER — LACTATED RINGERS IV SOLN
INTRAVENOUS | Status: DC
  Administered 2017-09-05: 14:00:00 via INTRAVENOUS

## 2017-09-05 MED ORDER — FENTANYL CITRATE (PF) 100 MCG/2ML IJ SOLN
INTRAMUSCULAR | Status: AC
  Administered 2017-09-05: 50 ug via INTRAVENOUS
  Filled 2017-09-05: qty 2

## 2017-09-05 MED ORDER — GABAPENTIN 300 MG PO CAPS: 300.0000 mg | ORAL_CAPSULE | Freq: Once | ORAL | Status: DC

## 2017-09-05 MED ORDER — CEFAZOLIN SODIUM-DEXTROSE 2-4 GM/100ML-% IV SOLN
2.0000 g | Freq: Four times a day (QID) | INTRAVENOUS | Status: AC
  Administered 2017-09-05 – 2017-09-06 (×2): 2 g via INTRAVENOUS
  Filled 2017-09-05 (×2): qty 100

## 2017-09-05 MED ORDER — DOCUSATE SODIUM 100 MG PO CAPS
100.0000 mg | ORAL_CAPSULE | Freq: Two times a day (BID) | ORAL | Status: DC
  Administered 2017-09-05 – 2017-09-09 (×7): 100 mg via ORAL
  Filled 2017-09-05 (×8): qty 1

## 2017-09-05 MED ORDER — SUGAMMADEX SODIUM 200 MG/2ML IV SOLN
INTRAVENOUS | Status: DC | PRN
  Administered 2017-09-05: 170 mg via INTRAVENOUS

## 2017-09-05 MED ORDER — MAGNESIUM CITRATE PO SOLN: 1.0000 | Freq: Once | ORAL | Status: DC | PRN

## 2017-09-05 MED ORDER — ONDANSETRON HCL 4 MG/2ML IJ SOLN
INTRAMUSCULAR | Status: DC | PRN
  Administered 2017-09-05: 4 mg via INTRAVENOUS

## 2017-09-05 SURGICAL SUPPLY — 36 items
BIT DRILL AO GAMMA 4.2X180 (BIT) ×3 IMPLANT
BNDG COHESIVE 4X5 TAN STRL (GAUZE/BANDAGES/DRESSINGS) ×3 IMPLANT
BNDG GAUZE ELAST 4 BULKY (GAUZE/BANDAGES/DRESSINGS) ×3 IMPLANT
COVER PERINEAL POST (MISCELLANEOUS) ×3 IMPLANT
COVER SURGICAL LIGHT HANDLE (MISCELLANEOUS) ×3 IMPLANT
DRAPE STERI IOBAN 125X83 (DRAPES) ×3 IMPLANT
DRSG MEPILEX BORDER 4X4 (GAUZE/BANDAGES/DRESSINGS) ×9 IMPLANT
DURAPREP 26ML APPLICATOR (WOUND CARE) ×3 IMPLANT
ELECT REM PT RETURN 9FT ADLT (ELECTROSURGICAL) ×3
ELECTRODE REM PT RTRN 9FT ADLT (ELECTROSURGICAL) ×1 IMPLANT
GLOVE BIO SURGEON STRL SZ7.5 (GLOVE) ×6 IMPLANT
GLOVE BIOGEL PI IND STRL 8 (GLOVE) ×2 IMPLANT
GLOVE BIOGEL PI INDICATOR 8 (GLOVE) ×4
GOWN STRL REUS W/ TWL LRG LVL3 (GOWN DISPOSABLE) ×3 IMPLANT
GOWN STRL REUS W/TWL LRG LVL3 (GOWN DISPOSABLE) ×9
GUIDEROD T2 3X1000 (ROD) ×3 IMPLANT
K-WIRE  3.2X450M STR (WIRE) ×2
K-WIRE 3.2X450M STR (WIRE) ×1
KIT NAIL LONG 10X140X125 (Nail) ×3 IMPLANT
KIT ROOM TURNOVER OR (KITS) ×3 IMPLANT
KWIRE 3.2X450M STR (WIRE) ×1 IMPLANT
MANIFOLD NEPTUNE II (INSTRUMENTS) ×3 IMPLANT
NS IRRIG 1000ML POUR BTL (IV SOLUTION) ×3 IMPLANT
PACK GENERAL/GYN (CUSTOM PROCEDURE TRAY) ×3 IMPLANT
PAD ARMBOARD 7.5X6 YLW CONV (MISCELLANEOUS) ×3 IMPLANT
PAD CAST 4YDX4 CTTN HI CHSV (CAST SUPPLIES) ×1 IMPLANT
PADDING CAST COTTON 4X4 STRL (CAST SUPPLIES) ×3
SCREW LAG GAMMA 3 TI 10.5X115M (Screw) ×3 IMPLANT
SCREW LOCKING T2 F/T  5MMX55MM (Screw) ×2 IMPLANT
SCREW LOCKING T2 F/T 5MMX55MM (Screw) ×1 IMPLANT
SUT MNCRL AB 4-0 PS2 18 (SUTURE) IMPLANT
SUT MON AB 2-0 CT1 27 (SUTURE) ×3 IMPLANT
SUT VIC AB 0 CT1 27 (SUTURE) ×2
SUT VIC AB 0 CT1 27XBRD ANBCTR (SUTURE) ×1 IMPLANT
TOWEL OR 17X24 6PK STRL BLUE (TOWEL DISPOSABLE) ×3 IMPLANT
TOWEL OR 17X26 10 PK STRL BLUE (TOWEL DISPOSABLE) ×3 IMPLANT

## 2017-09-05 NOTE — Progress Notes (Signed)
Orthopedic Tech Progress Note Patient Details:  Larry Briggs March 26, 1936 [id number]  Patient ID: Larry Briggs, male   DOB: 1936/06/02, 82 y.o.   MRN: [id number] Pt cant have ohf due to age restrictions  Karolee Stamps 09/05/2017, 11:06 PM

## 2017-09-05 NOTE — Anesthesia Preprocedure Evaluation (Signed)
Anesthesia Evaluation  Patient identified by MRN, date of birth, ID band Patient awake    Reviewed: Allergy & Precautions, NPO status , Patient's Chart, lab work & pertinent test results  Airway Mallampati: II  TM Distance: >3 FB Neck ROM: Full    Dental  (+) Dental Advisory Given   Pulmonary COPD, former smoker,    breath sounds clear to auscultation       Cardiovascular hypertension, Pt. on medications + CAD, + Cardiac Stents and + CABG   Rhythm:Regular Rate:Normal     Neuro/Psych negative neurological ROS     GI/Hepatic negative GI ROS, Neg liver ROS,   Endo/Other  negative endocrine ROS  Renal/GU negative Renal ROS     Musculoskeletal   Abdominal   Peds  Hematology negative hematology ROS (+)   Anesthesia Other Findings   Reproductive/Obstetrics                             Lab Results  Component Value Date   WBC 8.1 09/05/2017   HGB 9.8 (L) 09/05/2017   HCT 28.7 (L) 09/05/2017   MCV 92.3 09/05/2017   PLT 204 09/05/2017   Lab Results  Component Value Date   CREATININE 1.12 09/05/2017   BUN 19 09/05/2017   NA 138 09/05/2017   K 4.0 09/05/2017   CL 105 09/05/2017   CO2 23 09/05/2017    Anesthesia Physical Anesthesia Plan  ASA: III  Anesthesia Plan: General   Post-op Pain Management:    Induction: Intravenous  PONV Risk Score and Plan: 3 and Ondansetron, Dexamethasone and Treatment may vary due to age or medical condition  Airway Management Planned: Oral ETT  Additional Equipment:   Intra-op Plan:   Post-operative Plan: Extubation in OR  Informed Consent: I have reviewed the patients History and Physical, chart, labs and discussed the procedure including the risks, benefits and alternatives for the proposed anesthesia with the patient or authorized representative who has indicated his/her understanding and acceptance.   Dental advisory given  Plan Discussed  with: CRNA  Anesthesia Plan Comments:         Anesthesia Quick Evaluation

## 2017-09-05 NOTE — H&P (View-Only) (Signed)
ORTHOPAEDIC CONSULTATION  REQUESTING PHYSICIAN: Modena Jansky, MD  Chief Complaint: Left hip pain  Assessment / Plan: Active Problems:   Hypertension   Arteriosclerotic cardiovascular disease (ASCVD)   Hypercholesteremia   COPD (chronic obstructive pulmonary disease) (HCC)   Hip fracture (HCC)  Closed comminuted displaced left intertrochanteric femur fracture Plan for Operative fixation today by Dr. Alain Marion -NPO  -Medicine team to admit and perform pre-op clearance -Type and Cross for 2 units held for OR -PT/OT post op -Will ammend WB status postop, bedrest for now -Foley for comfort as needed to be removed POD 1/2 -Previously ambulatory and active without aid. -VTE prophylaxis: SCDs.  We will amend postoperatively  HPI: Larry Briggs is a 82 y.o. male who complains of left hip pain after falling onto same while donning his pants yesterday morning.  He had immediate pain and inability to bear weight.  He presented to the emergency department where x-rays showed a closed displaced and comminuted left intertrochanteric femur fracture.  Orthopedics was consulted for evaluation.  Today his pain is significant with movement but controlled.  Last meal Saturday. Denies history of CVA, DVT, PE.  He has had cardiac bypass and stenting.  Previously ambulatory without aid.  The patient was living with his wife in a one-story residence.  No stairs to negotiate.  He has a great desire to return home from the hospital if deemed appropriate.  Past Medical History:  Diagnosis Date  . CAD (coronary artery disease)    Remote MI in 1980. CABG 1995. Stent to left main in 2004. Last cath in April of 2012. EF 50%; patent LIMA to DX/LAD with collateralization of the distal right, patent SVG to OM, occluded SVG to distal RCA, and patent stent to LAD  . History of heart attack 1980  . Hypercholesteremia   . Hypertension   . Increased glucose level   . Obesity    Past Surgical History:   Procedure Laterality Date  . CARDIAC CATHETERIZATION  1993  . CARDIAC CATHETERIZATION  April 2012   Mild reduction if EF at 50%. Patent LIMA to DX/LAD with collateralization to distal RCA, patent SVG to OM and occluded SVG to distal right and patent stent to LAD/Left main;  . CORONARY ARTERY BYPASS GRAFT  05/1994   x5 LIMA to LAD & DX, SVG to LCX, SVG to RCA  . CORONARY STENT PLACEMENT  2004   Stent to the L main   Social History   Socioeconomic History  . Marital status: Married    Spouse name: None  . Number of children: None  . Years of education: None  . Highest education level: None  Social Needs  . Financial resource strain: None  . Food insecurity - worry: None  . Food insecurity - inability: None  . Transportation needs - medical: None  . Transportation needs - non-medical: None  Occupational History  . None  Tobacco Use  . Smoking status: Former Smoker    Packs/day: 1.00    Years: 40.00    Pack years: 40.00    Types: Cigarettes    Last attempt to quit: 07/02/1994    Years since quitting: 23.1  . Smokeless tobacco: Never Used  Substance and Sexual Activity  . Alcohol use: No  . Drug use: No  . Sexual activity: Yes  Other Topics Concern  . None  Social History Narrative  . None   Family History  Problem Relation Age of Onset  .  Heart attack Father   . Hypertension Father   . Stroke Mother    Allergies  Allergen Reactions  . Iodinated Diagnostic Agents Nausea And Vomiting and Rash   Prior to Admission medications   Medication Sig Start Date End Date Taking? Authorizing Provider  aspirin (ECOTRIN LOW STRENGTH) 81 MG EC tablet Take 81 mg by mouth at bedtime. Swallow whole.   Yes [provider]  Bioflavonoid Products (ESTER-C) 500-550 MG TABS Take 1 tablet by mouth daily.   Yes [provider]  budesonide-formoterol (SYMBICORT) 160-4.5 MCG/ACT inhaler Inhale 2 puffs into the lungs 2 (two) times daily.   Yes [provider]    chlorthalidone (HYGROTON) 25 MG tablet Take 1 tablet (25 mg total) by mouth daily. Patient taking differently: Take 25 mg by mouth at bedtime.  07/09/16 09/04/17 Yes Martinique, Peter M, MD  fish oil-omega-3 fatty acids 1000 MG capsule Take 1 g by mouth daily.    Yes [provider]  irbesartan (AVAPRO) 300 MG tablet Take 1 tablet (300 mg total) by mouth daily. 03/04/17  Yes [place], Peter M, MD  metoprolol succinate (TOPROL-XL) 50 MG 24 hr tablet TAKE 1 TABLET EVERY DAY Patient taking differently: Take 50 mg by mouth at bedtime 05/09/17  Yes [place], Peter M, MD  multivitamin-iron-minerals-folic acid (CENTRUM) chewable tablet Chew 1 tablet by mouth daily.   Yes [provider]  nitroGLYCERIN (NITROSTAT) 0.4 MG SL tablet Place 1 tablet (0.4 mg total) under the tongue as needed. Patient taking differently: Place 0.4 mg under the tongue every 5 (five) minutes as needed for chest pain.  12/17/16  Yes [place], Peter M, MD  simvastatin (ZOCOR) 20 MG tablet Take 1 tablet (20 mg total) by mouth at bedtime. 08/09/17  Yes [place], Peter M, MD  Spacer/Aero-Holding Chambers (AEROCHAMBER MV) inhaler Use as instructed 02/21/17  Yes Juanito Doom, MD  tamsulosin (FLOMAX) 0.4 MG CAPS capsule Take 0.4 mg by mouth daily after supper.   Yes [provider]  VENTOLIN HFA 108 (90 Base) MCG/ACT inhaler Inhale 2 puffs into the lungs every 4 (four) hours as needed for wheezing or shortness of breath. 12/30/16  Yes Mannam, Praveen, MD  Fluticasone-Salmeterol (AIRDUO RESPICLICK 202/54) 270-62 MCG/ACT AEPB Inhale 1 puff into the lungs 2 (two) times daily. 09/01/17   Juanito Doom, MD   Dg Chest 1 View  Result Date: 09/04/2017 CLINICAL DATA:  [place] on the left side. EXAM: CHEST 1 VIEW COMPARISON:  02/23/2016. FINDINGS: Normal sized heart. Clear lungs. The lungs remain hyperexpanded. Stable post CABG changes and thoracic spine degenerative changes. IMPRESSION: No acute abnormality.  Stable changes of COPD.  Electronically Signed   By: Claudie Revering M.D.   On: 09/04/2017 10:17   Dg Hip Unilat With Pelvis 2-3 Views Left  Result Date: 09/04/2017 CLINICAL DATA:  Left hip pain post fall. EXAM: DG HIP (WITH OR WITHOUT PELVIS) 2-3V LEFT COMPARISON:  None. FINDINGS: There is severely comminuted oblique intertrochanteric fracture of the left femur with superior dislocation of the distal fracture fragment and butterfly fragment containing the lesser trochanter. The glenohumeral joint is preserved. IMPRESSION: Severely comminuted oblique intertrochanteric fracture of the left femur with superior dislocation of the distal fracture fragment and butterfly fragment containing the lesser trochanter. Electronically Signed   By: Fidela Salisbury M.D.   On: 09/04/2017 10:18    Positive ROS: All other systems have been reviewed and were otherwise negative with the exception of those mentioned in the HPI and as above.  Objective: Labs cbc Recent Labs    09/04/17 1813 09/05/17 0511  WBC 10.8* 8.1  HGB 11.3* 9.8*  HCT 33.1* 28.7*  PLT 255 204    Labs inflam No results for input(s): CRP in the last 72 hours.  Invalid input(s): ESR  Labs coag Recent Labs    09/04/17 1047  INR 1.05    Recent Labs    09/04/17 1047 09/04/17 1813 09/05/17 0511  NA 138  --  138  K 3.6  --  4.0  CL 102  --  105  CO2 24  --  23  GLUCOSE 129*  --  151*  BUN 17  --  19  CREATININE 1.14 1.04 1.12  CALCIUM 9.0  --  8.1*    Physical Exam: Vitals:   09/04/17 2034 09/05/17 0444  BP: (!) 121/59 122/79  Pulse: (!) 101 87  Resp: 17 17  Temp: 99.1 F (37.3 C) 98.8 F (37.1 C)  SpO2: 99% 98%   General: Alert, no acute distress.  Upright in bed.  Calm, conversant.  Wife at bedside. Mental status: Alert and Oriented x3 Neurologic: Speech Clear and organized, no gross focal findings or movement disorder appreciated. Respiratory: No cyanosis, no use of accessory musculature Cardiovascular: No pedal edema GI: Abdomen is  soft and non-tender, non-distended. Skin: Warm and dry.  No lesions in the area of chief complaint  Extremities: Warm and well perfused w/o edema Psychiatric: Patient is competent for consent with normal mood and affect  MUSCULOSKELETAL:  Pain with movement left leg which is shortened and externally rotated.  His sensation is intact distally.  EHL FHL plantar flexion and dorsiflexion are all intact. Other extremities are atraumatic with painless ROM and NVI.   Prudencio Burly III PA-C 09/05/2017 6:34 AM

## 2017-09-05 NOTE — Consult Note (Signed)
[facility] CM Primary Care Navigator  09/05/2017  Larry Briggs [birth year] [id number]   Went to see patient at the bedsideto identify possible discharge needs but staffreports that heis in OR for surgeryat this time. (INTRAMEDULLARY (IM) NAIL INTERTROCHANTERIC LEFT).   Will attemptto see patient at another time when he is available in the room.     Addendum (09/06/17):  Went back to see patient at the bedside to identify possible discharge needs. Met with wife Larry Briggs) as well while patient is having therapy (OT) session in the room.  Wife reports that patient had a fall at home while putting up his pants that resulted to fracture of the left femur which had led to this admission and underwent surgery.  Patientendorses Dr.Walter Psychiatrist with [facility] as his primary care provider.   [facility] ([facility] prescriptions[facility] to obtain medications without difficulty so far.   Patient has beenmanaginghis ownmedications at home with use of "pill box" system filled every week.  Wife statesthatpatient was driving prior to admission but she will provide transportation to his doctors'appointments after discharge.  Patient's wife will be the primary caregiver at home after discharge.  Anticipated discharge plan is skilled nursing facility (SNF) per therapy recommendation but wife expressed hope of going home with home health services as stated. Inpatient social worker is aware of wife's preference.   Patient's wifewas encouraged to call primary care provider's office when patient returnshome,for a post discharge follow-up within 1-2 weeks or sooner if needs arise.Patient letter (with PCP's contact number) was provided as areminder.  Discussed with patient's wiferegarding [facility] CM services available for health management at home, but she verbalized that patient is able to  manage himself with her assistance and reminders. She reports that patient uses breathing treatments as ordered (COPD) anddoes close follow-up with providers when needed.COPD action plan discussed as well.  Encouraged patient's wifeto seekreferral from primary care provider to [facility] care management if deemednecessaryand appropriate for any services in the future.  [facility] care management information was provided for future needs that he may have.   For additional questions please contact:  Edwena Felty A. Banyan Goodchild, BSN, RN-BC [facility] PRIMARY CARE Navigator Cell: [phone]

## 2017-09-05 NOTE — Progress Notes (Signed)
PROGRESS NOTE   DAIVIK OVERLEY  DPO:[id number]    DOB: [birth year]    DOA: 09/04/2017  PCP: [name], MD   I have briefly reviewed patients previous medical records in [facility].  Brief Narrative:  82 year old male with PMH of CAD status post CABG 1995, PCI to left main 2004, HLD, HTN, COPD not on home oxygen, chronic low back pain,, fell at home while putting on his pants and sustained left hip fracture.  Orthopedics consulted and underwent IM nail 2/4.   Assessment & Plan:   Active Problems:   Hypertension   Arteriosclerotic cardiovascular disease (ASCVD)   Hypercholesteremia   COPD (chronic obstructive pulmonary disease) (HCC)   Hip fracture ([place])   1. Left intertrochanteric hip fracture: Orthopedics was consulted and patient underwent ORIF with left intertrochanteric IM nail on 09/05/17.  Weightbearing, DVT prophylaxis, wound care as per orthopedics. 2. Essential hypertension: Controlled.  Continue metoprolol.  Consider resuming ARB/diuretics in a.m. 3. Hyperlipidemia: Continue statins. 4. CAD status post CABG/PCI: Asymptomatic of chest pain.  Follows with Dr. Peter Martinique, Cardiology.  Resume aspirin when okay with orthopedics. 5. COPD: Stable without clinical bronchospasm.  Not on home oxygen.  Follows with Dr. Roselie Awkward, Pulmonology. 6. BPH: Continue tamsulosin.  Follows with [facility]. 7. Acute blood loss anemia: Related to hip fracture.  Follow CBC in a.m.  Transfuse if hemoglobin <8 g per DL.   DVT prophylaxis: Lovenox Code Status: Full Family Communication: Discussed in detail with patient's spouse at bedside. Disposition: To be determined pending PT evaluation.   Consultants:  Orthopedics  Procedures:  Left intertrochanteric IM nail.  Antimicrobials:  None   Subjective: Patient seen this morning prior to procedure.  Lives at home with spouse.  Does not use support for ambulation.  Reports some dyspnea on exertion related to his COPD but  no chest pain.  Left hip pain, worse with movement.  ROS: As above  Objective:  Vitals:   09/05/17 1730 09/05/17 1741 09/05/17 1745 09/05/17 1801  BP:  125/62  136/88  Pulse: (!) 103 95 92 97  Resp: 15 13 17 18   Temp:   (!) 97.4 F (36.3 C) 98.1 F (36.7 C)  TempSrc:    Oral  SpO2: 94% 95% 96% 97%  Weight:      Height:        Examination:  General exam: Moderately built and nourished, lying comfortably supine in bed. Respiratory system: Clear to auscultation. Respiratory effort normal. Cardiovascular system: S1 & S2 heard, RRR. No JVD, murmurs, rubs, gallops or clicks. No pedal edema. Gastrointestinal system: Abdomen is nondistended, soft and nontender. No organomegaly or masses felt. Normal bowel sounds heard. Central nervous system: Alert and oriented. No focal neurological deficits. Extremities: Symmetric 5 x 5 power.  Had ice pack to left hip. Skin: No rashes, lesions or ulcers Psychiatry: Judgement and insight appear normal. Mood & affect appropriate.     Data Reviewed: I have personally reviewed following labs and imaging studies  CBC: Recent Labs  Lab 09/04/17 1047 09/04/17 1813 09/05/17 0511  WBC 12.5* 10.8* 8.1  NEUTROABS 11.0*  --   --   HGB 12.3* 11.3* 9.8*  HCT 36.3* 33.1* 28.7*  MCV 92.1 92.2 92.3  PLT 258 255 431   Basic Metabolic Panel: Recent Labs  Lab 09/04/17 1047 09/04/17 1813 09/05/17 0511  NA 138  --  138  K 3.6  --  4.0  CL 102  --  105  CO2 24  --  23  GLUCOSE 129*  --  151*  BUN 17  --  19  CREATININE 1.14 1.04 1.12  CALCIUM 9.0  --  8.1*   Liver Function Tests: Recent Labs  Lab 09/04/17 1047  AST 25  ALT 17  ALKPHOS 80  BILITOT 1.1  PROT 6.1*  ALBUMIN 3.5   Coagulation Profile: Recent Labs  Lab 09/04/17 1047  INR 1.05     Recent Results (from the past 240 hour(s))  Surgical pcr screen     Status: None   Collection Time: 09/05/17  7:50 AM  Result Value Ref Range Status   MRSA, PCR NEGATIVE NEGATIVE Final    Staphylococcus aureus NEGATIVE NEGATIVE Final    Comment: (NOTE) The Xpert SA Assay (FDA approved for NASAL specimens in patients 26 years of age and older), is one component of a comprehensive surveillance program. It is not intended to diagnose infection nor to guide or monitor treatment. Performed at [facility], [place] [address] [place], [place] [address]          Radiology Studies: Dg Chest 1 View  Result Date: 09/04/2017 CLINICAL DATA:  [place] on the left side. EXAM: CHEST 1 VIEW COMPARISON:  02/23/2016. FINDINGS: Normal sized heart. Clear lungs. The lungs remain hyperexpanded. Stable post CABG changes and thoracic spine degenerative changes. IMPRESSION: No acute abnormality.  Stable changes of COPD. Electronically Signed   By: Claudie Revering M.D.   On: 09/04/2017 10:17   Dg C-arm 1-60 Min  Result Date: 09/05/2017 CLINICAL DATA:  82 year old male with left femur fracture. Subsequent encounter. EXAM: LEFT FEMUR 2 VIEWS; DG C-ARM 61-120 MIN COMPARISON:  09/04/2017. FINDINGS: Four C-arm views submitted for review after surgery. Open reduction and internal fixation of left intertrochanteric fracture with long stem femoral rod with distal fixation screw and sliding-type proximal screw. Complete intramedullary rod not imaged. Portions imaged unremarkable. IMPRESSION: Open reduction and internal fixation of left intertrochanteric fracture. Electronically Signed   By: Genia Del M.D.   On: 09/05/2017 16:37   Dg Hip Unilat With Pelvis 2-3 Views Left  Result Date: 09/04/2017 CLINICAL DATA:  Left hip pain post fall. EXAM: DG HIP (WITH OR WITHOUT PELVIS) 2-3V LEFT COMPARISON:  None. FINDINGS: There is severely comminuted oblique intertrochanteric fracture of the left femur with superior dislocation of the distal fracture fragment and butterfly fragment containing the lesser trochanter. The glenohumeral joint is preserved. IMPRESSION: Severely comminuted oblique intertrochanteric fracture of the  left femur with superior dislocation of the distal fracture fragment and butterfly fragment containing the lesser trochanter. Electronically Signed   By: Fidela Salisbury M.D.   On: 09/04/2017 10:18   Dg Femur Min 2 Views Left  Result Date: 09/05/2017 CLINICAL DATA:  82 year old male with left femur fracture. Subsequent encounter. EXAM: LEFT FEMUR 2 VIEWS; DG C-ARM 61-120 MIN COMPARISON:  09/04/2017. FINDINGS: Four C-arm views submitted for review after surgery. Open reduction and internal fixation of left intertrochanteric fracture with long stem femoral rod with distal fixation screw and sliding-type proximal screw. Complete intramedullary rod not imaged. Portions imaged unremarkable. IMPRESSION: Open reduction and internal fixation of left intertrochanteric fracture. Electronically Signed   By: Genia Del M.D.   On: 09/05/2017 16:37        Scheduled Meds: . [START ON 09/06/2017] aspirin EC  325 mg Oral Q breakfast  . docusate sodium  100 mg Oral BID  . enoxaparin (LOVENOX) injection  40 mg Subcutaneous Q24H  . metoprolol succinate  50 mg Oral QHS  .  mometasone-formoterol  2 puff Inhalation BID  . senna  1 tablet Oral BID  . simvastatin  20 mg Oral QHS  . tamsulosin  0.4 mg Oral QPC supper   Continuous Infusions: .  ceFAZolin (ANCEF) IV Stopped (09/05/17 1844)  . lactated ringers Stopped (09/05/17 1615)  . methocarbamol (ROBAXIN)  IV       LOS: 1 day     Vernell Leep, MD, FACP, [facility] Pager [phone]  If 7PM-7AM, please contact night-coverage [email or web address] Password [email or web address] 09/05/2017, 7:17 PM

## 2017-09-05 NOTE — Transfer of Care (Signed)
Immediate Anesthesia Transfer of Care Note  Patient: Larry Briggs  Procedure(s) Performed: INTRAMEDULLARY (IM) NAIL INTERTROCHANTRIC (Left )  Patient Location: PACU  Anesthesia Type:General  Level of Consciousness: awake and patient cooperative  Airway & Oxygen Therapy: Patient Spontanous Breathing  Post-op Assessment: Report given to RN and Post -op Vital signs reviewed and stable  Post vital signs: Reviewed and stable  Last Vitals:  Vitals:   09/04/17 2034 09/05/17 0444  BP: (!) 121/59 122/79  Pulse: (!) 101 87  Resp: 17 17  Temp: 37.3 C 37.1 C  SpO2: 99% 98%    Last Pain:  Vitals:   09/05/17 0946  TempSrc:   PainSc: 3          Complications: No apparent anesthesia complications

## 2017-09-05 NOTE — Anesthesia Postprocedure Evaluation (Signed)
Anesthesia Post Note  Patient: Larry Briggs  Procedure(s) Performed: INTRAMEDULLARY (IM) NAIL INTERTROCHANTRIC (Left )     Patient location during evaluation: PACU Anesthesia Type: General Level of consciousness: sedated Pain management: pain level controlled Vital Signs Assessment: post-procedure vital signs reviewed and stable Respiratory status: spontaneous breathing and respiratory function stable Cardiovascular status: stable Postop Assessment: no apparent nausea or vomiting Anesthetic complications: no    Last Vitals:  Vitals:   09/05/17 1745 09/05/17 1801  BP:  136/88  Pulse: 92 97  Resp: 17 18  Temp: (!) 36.3 C 36.7 C  SpO2: 96% 97%    Last Pain:  Vitals:   09/05/17 1801  TempSrc: Oral  PainSc:                  Sharel Behne DANIEL

## 2017-09-05 NOTE — Interval H&P Note (Signed)
History and Physical Interval Note:  09/05/2017 2:36 PM  Larry Briggs  has presented today for surgery, with the diagnosis of left intertrochanteric hip fracture  The various methods of treatment have been discussed with the patient and family. After consideration of risks, benefits and other options for treatment, the patient has consented to  Procedure(s): INTRAMEDULLARY (IM) NAIL INTERTROCHANTRIC (Left) as a surgical intervention .  The patient's history has been reviewed, patient examined, no change in status, stable for surgery.  I have reviewed the patient's chart and labs.  Questions were answered to the patient's satisfaction.     Jarek Longton D

## 2017-09-05 NOTE — Anesthesia Procedure Notes (Signed)
Procedure Name: Intubation Date/Time: 09/05/2017 3:14 PM Performed by: Lance Coon, CRNA Pre-anesthesia Checklist: Patient identified, Emergency Drugs available, Suction available, Patient being monitored and Timeout performed Patient Re-evaluated:Patient Re-evaluated prior to induction Oxygen Delivery Method: Circle system utilized Preoxygenation: Pre-oxygenation with 100% oxygen Induction Type: IV induction Ventilation: Mask ventilation without difficulty Laryngoscope Size: Miller and 3 Grade View: Grade I Tube type: Oral Tube size: 7.5 mm Number of attempts: 1 Airway Equipment and Method: Stylet Placement Confirmation: ETT inserted through vocal cords under direct vision,  positive ETCO2 and breath sounds checked- equal and bilateral Secured at: 22 cm Tube secured with: Tape Dental Injury: Teeth and Oropharynx as per pre-operative assessment

## 2017-09-05 NOTE — Consult Note (Signed)
ORTHOPAEDIC CONSULTATION  REQUESTING PHYSICIAN: Modena Jansky, MD  Chief Complaint: Left hip pain  Assessment / Plan: Active Problems:   Hypertension   Arteriosclerotic cardiovascular disease (ASCVD)   Hypercholesteremia   COPD (chronic obstructive pulmonary disease) (HCC)   Hip fracture (HCC)  Closed comminuted displaced left intertrochanteric femur fracture Plan for Operative fixation today by Dr. Alain Marion -NPO  -Medicine team to admit and perform pre-op clearance -Type and Cross for 2 units held for OR -PT/OT post op -Will ammend WB status postop, bedrest for now -Foley for comfort as needed to be removed POD 1/2 -Previously ambulatory and active without aid. -VTE prophylaxis: SCDs.  We will amend postoperatively  HPI: Larry Briggs is a 82 y.o. male who complains of left hip pain after falling onto same while donning his pants yesterday morning.  He had immediate pain and inability to bear weight.  He presented to the emergency department where x-rays showed a closed displaced and comminuted left intertrochanteric femur fracture.  Orthopedics was consulted for evaluation.  Today his pain is significant with movement but controlled.  Last meal Saturday. Denies history of CVA, DVT, PE.  He has had cardiac bypass and stenting.  Previously ambulatory without aid.  The patient was living with his wife in a one-story residence.  No stairs to negotiate.  He has a great desire to return home from the hospital if deemed appropriate.  Past Medical History:  Diagnosis Date  . CAD (coronary artery disease)    Remote MI in 1980. CABG 1995. Stent to left main in 2004. Last cath in April of 2012. EF 50%; patent LIMA to DX/LAD with collateralization of the distal right, patent SVG to OM, occluded SVG to distal RCA, and patent stent to LAD  . History of heart attack 1980  . Hypercholesteremia   . Hypertension   . Increased glucose level   . Obesity    Past Surgical History:   Procedure Laterality Date  . CARDIAC CATHETERIZATION  1993  . CARDIAC CATHETERIZATION  April 2012   Mild reduction if EF at 50%. Patent LIMA to DX/LAD with collateralization to distal RCA, patent SVG to OM and occluded SVG to distal right and patent stent to LAD/Left main;  . CORONARY ARTERY BYPASS GRAFT  05/1994   x5 LIMA to LAD & DX, SVG to LCX, SVG to RCA  . CORONARY STENT PLACEMENT  2004   Stent to the L main   Social History   Socioeconomic History  . Marital status: Married    Spouse name: None  . Number of children: None  . Years of education: None  . Highest education level: None  Social Needs  . Financial resource strain: None  . Food insecurity - worry: None  . Food insecurity - inability: None  . Transportation needs - medical: None  . Transportation needs - non-medical: None  Occupational History  . None  Tobacco Use  . Smoking status: Former Smoker    Packs/day: 1.00    Years: 40.00    Pack years: 40.00    Types: Cigarettes    Last attempt to quit: 07/02/1994    Years since quitting: 23.1  . Smokeless tobacco: Never Used  Substance and Sexual Activity  . Alcohol use: No  . Drug use: No  . Sexual activity: Yes  Other Topics Concern  . None  Social History Narrative  . None   Family History  Problem Relation Age of Onset  .  Heart attack Father   . Hypertension Father   . Stroke Mother    Allergies  Allergen Reactions  . Iodinated Diagnostic Agents Nausea And Vomiting and Rash   Prior to Admission medications   Medication Sig Start Date End Date Taking? Authorizing Provider  aspirin (ECOTRIN LOW STRENGTH) 81 MG EC tablet Take 81 mg by mouth at bedtime. Swallow whole.   Yes [provider]  Bioflavonoid Products (ESTER-C) 500-550 MG TABS Take 1 tablet by mouth daily.   Yes [provider]  budesonide-formoterol (SYMBICORT) 160-4.5 MCG/ACT inhaler Inhale 2 puffs into the lungs 2 (two) times daily.   Yes [provider]    chlorthalidone (HYGROTON) 25 MG tablet Take 1 tablet (25 mg total) by mouth daily. Patient taking differently: Take 25 mg by mouth at bedtime.  07/09/16 09/04/17 Yes Martinique, Peter M, MD  fish oil-omega-3 fatty acids 1000 MG capsule Take 1 g by mouth daily.    Yes [provider]  irbesartan (AVAPRO) 300 MG tablet Take 1 tablet (300 mg total) by mouth daily. 03/04/17  Yes Martinique, Peter M, MD  metoprolol succinate (TOPROL-XL) 50 MG 24 hr tablet TAKE 1 TABLET EVERY DAY Patient taking differently: Take 50 mg by mouth at bedtime 05/09/17  Yes Martinique, Peter M, MD  multivitamin-iron-minerals-folic acid (CENTRUM) chewable tablet Chew 1 tablet by mouth daily.   Yes [provider]  nitroGLYCERIN (NITROSTAT) 0.4 MG SL tablet Place 1 tablet (0.4 mg total) under the tongue as needed. Patient taking differently: Place 0.4 mg under the tongue every 5 (five) minutes as needed for chest pain.  12/17/16  Yes Martinique, Peter M, MD  simvastatin (ZOCOR) 20 MG tablet Take 1 tablet (20 mg total) by mouth at bedtime. 08/09/17  Yes Martinique, Peter M, MD  Spacer/Aero-Holding Chambers (AEROCHAMBER MV) inhaler Use as instructed 02/21/17  Yes Juanito Doom, MD  tamsulosin (FLOMAX) 0.4 MG CAPS capsule Take 0.4 mg by mouth daily after supper.   Yes [provider]  VENTOLIN HFA 108 (90 Base) MCG/ACT inhaler Inhale 2 puffs into the lungs every 4 (four) hours as needed for wheezing or shortness of breath. 12/30/16  Yes Mannam, Praveen, MD  Fluticasone-Salmeterol (AIRDUO RESPICLICK 202/54) 270-62 MCG/ACT AEPB Inhale 1 puff into the lungs 2 (two) times daily. 09/01/17   Juanito Doom, MD   Dg Chest 1 View  Result Date: 09/04/2017 CLINICAL DATA:  [place] on the left side. EXAM: CHEST 1 VIEW COMPARISON:  02/23/2016. FINDINGS: Normal sized heart. Clear lungs. The lungs remain hyperexpanded. Stable post CABG changes and thoracic spine degenerative changes. IMPRESSION: No acute abnormality.  Stable changes of COPD.  Electronically Signed   By: Claudie Revering M.D.   On: 09/04/2017 10:17   Dg Hip Unilat With Pelvis 2-3 Views Left  Result Date: 09/04/2017 CLINICAL DATA:  Left hip pain post fall. EXAM: DG HIP (WITH OR WITHOUT PELVIS) 2-3V LEFT COMPARISON:  None. FINDINGS: There is severely comminuted oblique intertrochanteric fracture of the left femur with superior dislocation of the distal fracture fragment and butterfly fragment containing the lesser trochanter. The glenohumeral joint is preserved. IMPRESSION: Severely comminuted oblique intertrochanteric fracture of the left femur with superior dislocation of the distal fracture fragment and butterfly fragment containing the lesser trochanter. Electronically Signed   By: Fidela Salisbury M.D.   On: 09/04/2017 10:18    Positive ROS: All other systems have been reviewed and were otherwise negative with the exception of those mentioned in the HPI and as above.  Objective: Labs cbc Recent Labs    09/04/17 1813 09/05/17 0511  WBC 10.8* 8.1  HGB 11.3* 9.8*  HCT 33.1* 28.7*  PLT 255 204    Labs inflam No results for input(s): CRP in the last 72 hours.  Invalid input(s): ESR  Labs coag Recent Labs    09/04/17 1047  INR 1.05    Recent Labs    09/04/17 1047 09/04/17 1813 09/05/17 0511  NA 138  --  138  K 3.6  --  4.0  CL 102  --  105  CO2 24  --  23  GLUCOSE 129*  --  151*  BUN 17  --  19  CREATININE 1.14 1.04 1.12  CALCIUM 9.0  --  8.1*    Physical Exam: Vitals:   09/04/17 2034 09/05/17 0444  BP: (!) 121/59 122/79  Pulse: (!) 101 87  Resp: 17 17  Temp: 99.1 F (37.3 C) 98.8 F (37.1 C)  SpO2: 99% 98%   General: Alert, no acute distress.  Upright in bed.  Calm, conversant.  Wife at bedside. Mental status: Alert and Oriented x3 Neurologic: Speech Clear and organized, no gross focal findings or movement disorder appreciated. Respiratory: No cyanosis, no use of accessory musculature Cardiovascular: No pedal edema GI: Abdomen is  soft and non-tender, non-distended. Skin: Warm and dry.  No lesions in the area of chief complaint  Extremities: Warm and well perfused w/o edema Psychiatric: Patient is competent for consent with normal mood and affect  MUSCULOSKELETAL:  Pain with movement left leg which is shortened and externally rotated.  His sensation is intact distally.  EHL FHL plantar flexion and dorsiflexion are all intact. Other extremities are atraumatic with painless ROM and NVI.   Prudencio Burly III PA-C 09/05/2017 6:34 AM

## 2017-09-05 NOTE — Op Note (Signed)
DATE OF SURGERY:  09/05/2017  TIME: 4:01 PM  PATIENT NAME:  Larry Briggs  AGE: 82 y.o.  PRE-OPERATIVE DIAGNOSIS:  left intertrochanteric hip fracture  POST-OPERATIVE DIAGNOSIS:  SAME  PROCEDURE:  INTRAMEDULLARY (IM) NAIL INTERTROCHANTRIC  SURGEON:  Sophira Rumler D  ASSISTANT:  Roxan Hockey, PA-C, Larry Briggs was present and scrubbed throughout the case, critical for completion in a timely fashion, and for retraction, instrumentation, and closure.   OPERATIVE IMPLANTS: [employer] Gamma Nail  PREOPERATIVE INDICATIONS:  Larry Briggs is a 82 y.o. year old who fell and suffered a hip fracture. Larry Briggs was brought into the ER and then admitted and optimized and then elected for surgical intervention.    The risks benefits and alternatives were discussed with the patient including but not limited to the risks of nonoperative treatment, versus surgical intervention including infection, bleeding, nerve injury, malunion, nonunion, hardware prominence, hardware failure, need for hardware removal, blood clots, cardiopulmonary complications, morbidity, mortality, among others, and they were willing to proceed.    OPERATIVE PROCEDURE:  The patient was brought to the operating room and placed in the supine position. General anesthesia was administered. Larry Briggs was placed on the fracture table.  Closed reduction was performed under C-arm guidance. Time out was then performed after sterile prep and drape. Larry Briggs received preoperative antibiotics.  Incision was made proximal to the greater trochanter. A guidewire was placed in the appropriate position. Confirmation was made on AP and lateral views. The above-named nail was opened. I opened the proximal femur with a reamer. I then placed the nail by hand easily down. I did not need to ream the femur.  Once the nail was completely seated, I placed a guidepin into the femoral head into the center center position. I measured the length, and then reamed the lateral cortex  and up into the head. I then placed the lag screw. Slight compression was applied. Anatomic fixation achieved. Bone quality was mediocre.  I then secured the proximal interlocking bolt, and took off a half a turn, and then removed the instruments, and took final C-arm pictures AP and lateral the entire length of the leg.  I then used perfect circles technique to place a distal interlock screw.   Anatomic reconstruction was achieved, and the wounds were irrigated copiously and closed with Vicryl followed by staples and sterile gauze for the skin. The patient was awakened and returned to PACU in stable and satisfactory condition. There no complications and the patient tolerated the procedure well.  Larry Briggs will be weightbearing as tolerated, and will be on chemical px  for a period of four weeks after discharge.   Edmonia Lynch, M.D.

## 2017-09-06 DIAGNOSIS — I2510 Atherosclerotic heart disease of native coronary artery without angina pectoris: Secondary | ICD-10-CM

## 2017-09-06 DIAGNOSIS — D62 Acute posthemorrhagic anemia: Secondary | ICD-10-CM

## 2017-09-06 DIAGNOSIS — R9431 Abnormal electrocardiogram [ECG] [EKG]: Secondary | ICD-10-CM

## 2017-09-06 DIAGNOSIS — S72002A Fracture of unspecified part of neck of left femur, initial encounter for closed fracture: Secondary | ICD-10-CM

## 2017-09-06 LAB — URINE CULTURE: CULTURE: NO GROWTH

## 2017-09-06 LAB — CBC
HEMATOCRIT: 23.5 % — AB (ref 39.0–52.0)
Hemoglobin: 8.2 g/dL — ABNORMAL LOW (ref 13.0–17.0)
MCH: 31.4 pg (ref 26.0–34.0)
MCHC: 34.9 g/dL (ref 30.0–36.0)
MCV: 90 fL (ref 78.0–100.0)
Platelets: 191 10*3/uL (ref 150–400)
RBC: 2.61 MIL/uL — ABNORMAL LOW (ref 4.22–5.81)
RDW: 13 % (ref 11.5–15.5)
WBC: 10.4 10*3/uL (ref 4.0–10.5)

## 2017-09-06 LAB — BASIC METABOLIC PANEL
Anion gap: 14 (ref 5–15)
BUN: 21 mg/dL — ABNORMAL HIGH (ref 6–20)
CALCIUM: 8.3 mg/dL — AB (ref 8.9–10.3)
CO2: 23 mmol/L (ref 22–32)
Chloride: 100 mmol/L — ABNORMAL LOW (ref 101–111)
Creatinine, Ser: 1.13 mg/dL (ref 0.61–1.24)
GFR calc non Af Amer: 60 mL/min — ABNORMAL LOW (ref >60)
GLUCOSE: 171 mg/dL — AB (ref 65–99)
Potassium: 4 mmol/L (ref 3.5–5.1)
Sodium: 137 mmol/L (ref 135–145)

## 2017-09-06 LAB — FERRITIN: FERRITIN: 303 ng/mL (ref 24–336)

## 2017-09-06 LAB — TROPONIN I
Troponin I: <0.03 ng/mL (ref <0.03)
Troponin I: 0.03 ng/mL (ref <0.03)

## 2017-09-06 LAB — IRON AND TIBC
Iron: 41 ug/dL — ABNORMAL LOW (ref 45–182)
Saturation Ratios: 19 % (ref 17.9–39.5)
TIBC: 211 ug/dL — AB (ref 250–450)
UIBC: 170 ug/dL

## 2017-09-06 LAB — TSH: TSH: 0.8 u[IU]/mL (ref 0.350–4.500)

## 2017-09-06 MED ORDER — BACLOFEN 10 MG PO TABS: 10.0000 mg | ORAL_TABLET | Freq: Three times a day (TID) | ORAL | 0 refills | Status: DC | PRN

## 2017-09-06 MED ORDER — ONDANSETRON HCL 4 MG PO TABS: 4.0000 mg | ORAL_TABLET | Freq: Three times a day (TID) | ORAL | 0 refills | Status: DC | PRN

## 2017-09-06 MED ORDER — HYDROCODONE-ACETAMINOPHEN 5-325 MG PO TABS: 1.0000 | ORAL_TABLET | Freq: Four times a day (QID) | ORAL | 0 refills | Status: DC | PRN

## 2017-09-06 MED ORDER — ASPIRIN EC 325 MG PO TBEC: 325.0000 mg | DELAYED_RELEASE_TABLET | Freq: Every day | ORAL | 0 refills | Status: DC

## 2017-09-06 NOTE — Evaluation (Addendum)
Occupational Therapy Evaluation Patient Details Name: Larry Briggs MRN: [id number] DOB: August 06, 1935 Today's Date: 09/06/2017    History of Present Illness 82yo male who fell while donning pants, had immediate pain and inability to bear weight. X-ray showed L intertrochanteric femur fracture. He received surgery for L IM nail placement on 09/05/17. PMH CAD, hx MI, HTN, obesity, hx CABG, coronary stent placement, cardiac cath    Clinical Impression   PTA, pt reports independence with ADL and functional mobility. He currently is limited by elevated HR, LLE pain, and fatigue. Pt was able to sit at EOB for ADL participation this session and complete sit<>stand in preparation for ADL transfers. Pt with elevated HR up to 144 when seated at EOB but quickly down to 124 with relaxation techniques. Upon standing, HR up to 137 and assisted pt to return to supine position with HR back down to 118. Pt discouraged concerning this limitation but very motivated to participate with OT. Pt currently requiring max assist for LB ADL and mod assist for sit<>stand this session. He would benefit from continued OT services while admitted to improve independence with ADL and functional mobility. At current functional level, pt requires significant assistance for ADL participation. Pt and family have a strong desire to return home at D/C. Discussed with case management and feel that pt would benefit from home health services with 24 hour assistance through [place] program. However, if unable to obtain necessary assistance, pt will need short-term SNF placement for rehabilitation.     Follow Up Recommendations  SNF;Supervision/Assistance - 24 hour    Equipment Recommendations  3 in 1 bedside commode    Recommendations for Other Services       Precautions / Restrictions Precautions Precautions: Fall Restrictions Weight Bearing Restrictions: Yes LLE Weight Bearing: Weight bearing as tolerated       Mobility Bed Mobility Overal bed mobility: Needs Assistance Bed Mobility: Supine to Sit;Sit to Supine     Supine to sit: Min assist Sit to supine: Min assist   General bed mobility comments: Assist to manage LLE only.   Transfers Overall transfer level: Needs assistance Equipment used: Rolling walker (2 wheeled) Transfers: Sit to/from Stand Sit to Stand: Mod assist         General transfer comment: Assist to power up. Pt heavily reliant on B UE despite education concerning safety and recommendation not to pull up on RW.     Balance Overall balance assessment: Needs assistance Sitting-balance support: Bilateral upper extremity supported;Feet supported Sitting balance-Leahy Scale: Good     Standing balance support: Bilateral upper extremity supported;During functional activity Standing balance-Leahy Scale: Poor Standing balance comment: reliant on UE support                            ADL either performed or assessed with clinical judgement   ADL Overall ADL's : Needs assistance/impaired Eating/Feeding: Set up;Sitting   Grooming: Set up;Sitting   Upper Body Bathing: Sitting;Supervision/ safety   Lower Body Bathing: Maximal assistance;Sit to/from stand   Upper Body Dressing : Sitting;Supervision/safety   Lower Body Dressing: Maximal assistance;Sit to/from stand                 General ADL Comments: Pt able to complete sit<>stand in preparation for ADL participation today with mod assist. Due to elevated HR and SpO2 desaturation with activity, unable to progress further. When discussing mobility seated at EOB, pt with HR elevated to 144  and quickly back down to 120s. With standing, HR up to 137 and SpO2 desaturation to 85% although unsure of accuracy secondary to tight grasp on RW.      Vision Patient Visual Report: No change from baseline Vision Assessment?: No apparent visual deficits     Perception     Praxis      Pertinent Vitals/Pain  Pain Assessment: Faces Faces Pain Scale: Hurts little more Pain Location: LLE in standing only Pain Descriptors / Indicators: Grimacing Pain Intervention(s): Limited activity within patient's tolerance;Monitored during session;Repositioned     Hand Dominance     Extremity/Trunk Assessment Upper Extremity Assessment Upper Extremity Assessment: Defer to OT evaluation   Lower Extremity Assessment Lower Extremity Assessment: Generalized weakness   Cervical / Trunk Assessment Cervical / Trunk Assessment: Normal   Communication Communication Communication: No difficulties   Cognition Arousal/Alertness: Lethargic Behavior During Therapy: WFL for tasks assessed/performed Overall Cognitive Status: Within Functional Limits for tasks assessed                                 General Comments: Very motivated for participation   General Comments       Exercises     Shoulder Instructions      Home Living Family/patient expects to be discharged to:: Private residence Living Arrangements: Spouse/significant other Available Help at Discharge: Family;Available 24 hours/day Type of Home: House Home Access: Stairs to enter [employer] of Steps: 1 Entrance Stairs-Rails: None Home Layout: One level     Bathroom Shower/Tub: Teacher, early years/pre: Standard     Home Equipment: None          Prior Functioning/Environment Level of Independence: Independent                 OT Problem List: Decreased strength;Decreased range of motion;Decreased activity tolerance;Impaired balance (sitting and/or standing);Decreased safety awareness;Decreased knowledge of use of DME or AE;Decreased knowledge of precautions;Cardiopulmonary status limiting activity;Pain      OT Treatment/Interventions: Self-care/ADL training;Therapeutic exercise;Energy conservation;DME and/or AE instruction;Therapeutic activities;Patient/family education;Balance training     OT Goals(Current goals can be found in the care plan section) Acute Rehab OT Goals Patient Stated Goal: to go home  OT Goal Formulation: With patient Time For Goal Achievement: 09/20/17 Potential to Achieve Goals: Good ADL Goals Pt Will Perform Grooming: with supervision;standing Pt Will Perform Lower Body Dressing: sit to/from stand;with min assist;with caregiver independent in assisting Pt Will Transfer to Toilet: with min assist;ambulating;bedside commode(BSC over toilet) Pt Will Perform Toileting - Clothing Manipulation and hygiene: with min assist;sit to/from stand  OT Frequency: Min 3X/week   Barriers to D/C:            Co-evaluation              AM-PAC PT "6 Clicks" Daily Activity     Outcome Measure Help from another person eating meals?: A Little Help from another person taking care of personal grooming?: A Little Help from another person toileting, which includes using toliet, bedpan, or urinal?: A Lot Help from another person bathing (including washing, rinsing, drying)?: A Lot Help from another person to put on and taking off regular upper body clothing?: A Little Help from another person to put on and taking off regular lower body clothing?: A Lot 6 Click Score: 15   End of Session Equipment Utilized During Treatment: Rolling walker Nurse Communication: Mobility status;Patient requests pain meds  Activity Tolerance:  Patient tolerated treatment well Patient left: in bed;with bed alarm set;with family/visitor present;with call bell/phone within reach  OT Visit Diagnosis: Other abnormalities of gait and mobility (R26.89);Muscle weakness (generalized) (M62.81);Pain Pain - Right/Left: Left Pain - part of body: Hip                Time: 4643-1427 OT Time Calculation (min): 29 min Charges:  OT General Charges $OT Visit: 1 Visit OT Evaluation $OT Eval Moderate Complexity: 1 Mod OT Treatments $Self Care/Home Management : 8-22 mins G-Codes:     Norman Herrlich, MS OTR/L  Pager: [place] [facility] [place] 09/06/2017, 4:55 PM

## 2017-09-06 NOTE — Consult Note (Signed)
Cardiology Consult    Patient ID: Larry Briggs MRN: [id number], DOB/AGE: October 28, 1935   Admit date: 09/04/2017 Date of Consult: 09/06/2017  Primary Physician: [name], MD Primary Cardiologist: Dr. Martinique Requesting Provider: Dr. Carolin Sicks Reason for Consultation: Abnormal EKG  Larry Briggs is a 82 y.o. male who is being seen today for the evaluation of Abnormal EKG at the request of Dr. Carolin Sicks.  Patient Profile    82 yo male with PMH of CAD s/p CABG ('95) with LM stenting in 2004, HTN, COPD, HL who presented with left hip fx after mechanical fall. Now with abnormal EKG.   Past Medical History   Past Medical History:  Diagnosis Date  . CAD (coronary artery disease)    Remote MI in 1980. CABG 1995. Stent to left main in 2004. Last cath in April of 2012. EF 50%; patent LIMA to DX/LAD with collateralization of the distal right, patent SVG to OM, occluded SVG to distal RCA, and patent stent to LAD  . History of heart attack 1980  . Hypercholesteremia   . Hypertension   . Increased glucose level   . Obesity     Past Surgical History:  Procedure Laterality Date  . CARDIAC CATHETERIZATION  1993  . CARDIAC CATHETERIZATION  April 2012   Mild reduction if EF at 50%. Patent LIMA to DX/LAD with collateralization to distal RCA, patent SVG to OM and occluded SVG to distal right and patent stent to LAD/Left main;  . CORONARY ARTERY BYPASS GRAFT  05/1994   x5 LIMA to LAD & DX, SVG to LCX, SVG to RCA  . CORONARY STENT PLACEMENT  2004   Stent to the L main     Allergies  Allergies  Allergen Reactions  . Iodinated Diagnostic Agents Nausea And Vomiting and Rash    History of Present Illness    Larry Briggs is a 82 yo male with PMH of CAD s/p CABG ('95) with LM stenting in 2004, HTN, COPD, HL. He was followed by Dr. Doreatha Lew until retirement then transitioned to Dr. Martinique. His last cath was in 2012 with Dr. Lia Foyer. Noted patent LIMA-LAD, SVG-OM, with occluded SVG-dRCA, and  patent stent in LM. PDA was supplied by L-R collaterals. EF noted at 50% with inferobasal WMA unchanged from previous cath. He was last seen in the office on 11/18 and doing ok. Reported ongoing chronic shortness of breath 2/2 to his COPD. Also lots of back pain with activity with his arthritis. Noted to have trouble controlling his blood pressures in the past and was seen at the HTN clinic, but better controlled at this visit.   He presented to the ED on 09/04/17 after a mechanical fall at home. Reports he was putting on his pants and lost his balance. Landed on the left hip and had immediate pain with inability to bear weight. In the ED xray showed closed displaced/comminuted left femur fracture. Ortho was consulted and he underwent IM nail placement with Dr. Percell Miller on 09/05/17. Seen by PT this morning and reported to have an elevated HR prior to physical activity. He stood with PT and HR elevated into the 130s, then returned to baseline once he sat down. Did feel fatigued but no chest pain noted. EKG was obtained showing ST with TWI in anterolateral leads which was new from admission EKG. In talking with patient he does report feeling like his heart "skips beats at times", and did feel some of this, this morning. He is not currently being monitored  on telemetry, but rates on on pulse ox are still elevated in the 110s at times. Labs this morning do show a decline in Hgb from 11.3>>9.8>>8.2. Otherwise stable.   Inpatient Medications    . aspirin EC  325 mg Oral Q breakfast  . docusate sodium  100 mg Oral BID  . metoprolol succinate  50 mg Oral QHS  . mometasone-formoterol  2 puff Inhalation BID  . senna  1 tablet Oral BID  . simvastatin  20 mg Oral QHS  . tamsulosin  0.4 mg Oral QPC supper    Family History    Family History  Problem Relation Age of Onset  . Heart attack Father   . Hypertension Father   . Stroke Mother     Social History    Social History   Socioeconomic History  . Marital  status: Married    Spouse name: Not on file  . Number of children: Not on file  . Years of education: Not on file  . Highest education level: Not on file  Social Needs  . Financial resource strain: Not on file  . Food insecurity - worry: Not on file  . Food insecurity - inability: Not on file  . Transportation needs - medical: Not on file  . Transportation needs - non-medical: Not on file  Occupational History  . Not on file  Tobacco Use  . Smoking status: Former Smoker    Packs/day: 1.00    Years: 40.00    Pack years: 40.00    Types: Cigarettes    Last attempt to quit: 07/02/1994    Years since quitting: 23.1  . Smokeless tobacco: Never Used  Substance and Sexual Activity  . Alcohol use: No  . Drug use: No  . Sexual activity: Yes  Other Topics Concern  . Not on file  Social History Narrative  . Not on file     Review of Systems    See HPI  All other systems reviewed and are otherwise negative except as noted above.  Physical Exam    Blood pressure (!) 106/50, pulse (!) 107, temperature 98 F (36.7 C), temperature source Oral, resp. rate 18, height 5\' 10"  (1.778 m), weight 182 lb 8.7 oz (82.8 kg), SpO2 97 %.  General: Pleasant, pale older WM, NAD Psych: Normal affect. Neuro: Alert and oriented X 3. Moves all extremities spontaneously. HEENT: Normal  Neck: Supple without bruits or JVD. Lungs:  Resp regular and unlabored, CTA. Heart: Tachy no s3, s4, or murmurs. Abdomen: Soft, non-tender, non-distended, BS + x 4.  Extremities: No clubbing, cyanosis or edema. DP/PT/Radials 2+ and equal bilaterally. Dressing to left hip with ice packs in place.   Labs    Troponin (Point of Care Test) No results for input(s): TROPIPOC in the last 72 hours. No results for input(s): CKTOTAL, CKMB, TROPONINI in the last 72 hours. Lab Results  Component Value Date   WBC 10.4 09/06/2017   HGB 8.2 (L) 09/06/2017   HCT 23.5 (L) 09/06/2017   MCV 90.0 09/06/2017   PLT 191 09/06/2017     Recent Labs  Lab 09/04/17 1047  09/06/17 0650  NA 138   < > 137  K 3.6   < > 4.0  CL 102   < > 100*  CO2 24   < > 23  BUN 17   < > 21*  CREATININE 1.14   < > 1.13  CALCIUM 9.0   < > 8.3*  PROT 6.1*  --   --  BILITOT 1.1  --   --   ALKPHOS 80  --   --   ALT 17  --   --   AST 25  --   --   GLUCOSE 129*   < > 171*   < > = values in this interval not displayed.   Lab Results  Component Value Date   CHOL  11/14/2010    112        ATP III CLASSIFICATION:  <200     mg/dL   Desirable  200-239  mg/dL   Borderline High  >=240    mg/dL   High          HDL 40 11/14/2010   LDLCALC  11/14/2010    50        Total Cholesterol/HDL:CHD Risk Coronary Heart Disease Risk Table                     Men   Women  1/2 Average Risk   3.4   3.3  Average Risk       5.0   4.4  2 X Average Risk   9.6   7.1  3 X Average Risk  23.4   11.0        Use the calculated Patient Ratio above and the CHD Risk Table to determine the patient's CHD Risk.        ATP III CLASSIFICATION (LDL):  <100     mg/dL   Optimal  100-129  mg/dL   Near or Above                    Optimal  130-159  mg/dL   Borderline  160-189  mg/dL   High  >190     mg/dL   Very High   TRIG 108 11/14/2010   No results found for: [facility]   Radiology Studies    Dg Chest 1 View  Result Date: 09/04/2017 CLINICAL DATA:  [place] on the left side. EXAM: CHEST 1 VIEW COMPARISON:  02/23/2016. FINDINGS: Normal sized heart. Clear lungs. The lungs remain hyperexpanded. Stable post CABG changes and thoracic spine degenerative changes. IMPRESSION: No acute abnormality.  Stable changes of COPD. Electronically Signed   By: Claudie Revering M.D.   On: 09/04/2017 10:17   Pelvis Portable  Result Date: 09/05/2017 CLINICAL DATA:  Fracture repair EXAM: PORTABLE PELVIS 1-2 VIEWS COMPARISON:  None. FINDINGS: A gamma nail and rod have been placed across the left hip fracture. Visualize portions of the hardware are in good position. No other changes. IMPRESSION:  A gamma nail and intramedullary rod have been placed across the known left hip fracture. Electronically Signed   By: Dorise Bullion III M.D   On: 09/05/2017 19:24   Dg C-arm 1-60 Min  Result Date: 09/05/2017 CLINICAL DATA:  82 year old male with left femur fracture. Subsequent encounter. EXAM: LEFT FEMUR 2 VIEWS; DG C-ARM 61-120 MIN COMPARISON:  09/04/2017. FINDINGS: Four C-arm views submitted for review after surgery. Open reduction and internal fixation of left intertrochanteric fracture with long stem femoral rod with distal fixation screw and sliding-type proximal screw. Complete intramedullary rod not imaged. Portions imaged unremarkable. IMPRESSION: Open reduction and internal fixation of left intertrochanteric fracture. Electronically Signed   By: Genia Del M.D.   On: 09/05/2017 16:37   Dg Hip Unilat With Pelvis 2-3 Views Left  Result Date: 09/04/2017 CLINICAL DATA:  Left hip pain post fall. EXAM: DG HIP (WITH OR WITHOUT PELVIS) 2-3V  LEFT COMPARISON:  None. FINDINGS: There is severely comminuted oblique intertrochanteric fracture of the left femur with superior dislocation of the distal fracture fragment and butterfly fragment containing the lesser trochanter. The glenohumeral joint is preserved. IMPRESSION: Severely comminuted oblique intertrochanteric fracture of the left femur with superior dislocation of the distal fracture fragment and butterfly fragment containing the lesser trochanter. Electronically Signed   By: Fidela Salisbury M.D.   On: 09/04/2017 10:18   Dg Femur Min 2 Views Left  Result Date: 09/05/2017 CLINICAL DATA:  82 year old male with left femur fracture. Subsequent encounter. EXAM: LEFT FEMUR 2 VIEWS; DG C-ARM 61-120 MIN COMPARISON:  09/04/2017. FINDINGS: Four C-arm views submitted for review after surgery. Open reduction and internal fixation of left intertrochanteric fracture with long stem femoral rod with distal fixation screw and sliding-type proximal screw. Complete  intramedullary rod not imaged. Portions imaged unremarkable. IMPRESSION: Open reduction and internal fixation of left intertrochanteric fracture. Electronically Signed   By: Genia Del M.D.   On: 09/05/2017 16:37    ECG & Cardiac Imaging    EKG: ST with TWI in anterolateral leads   Assessment & Plan    82 yo male with PMH of CAD s/p CABG ('95) with LM stenting in 2004, HTN, COPD, HL who presented with left hip fx after mechanical fall. Now with abnormal EKG.   1. Abnormal EKG with hx CABG/LM stent: Patient reports prior to admission he's had no anginal symptoms. At baseline he is short of breath 2/2 to his COPD. No chest pain noted this admission. EKG does show new TWI in anterolateral leads in the setting of tachycardia, and anemia.  -- cycle Troponins -- check echo for WMA -- recheck EKG in the am  2. Left femur fracture s/p IM nail: doing well from ortho standpoint. Now with weightbearing status.   3. Tachycardia: HR has been elevated in the 110s throughout the day. Does state he has "felt his heart missing beats" at times. No hx of arrhythmias. Would consider placing him on telemetry to observe rhythm to rule out any arrhythmia.   4. Anemia: Hgb dropped from 11>>9>>8.2 today. Could be post op, but would transfuse to maintain above 8 given his hx of CAD.  -- follow up CBC  5. COPD: stable at baseline per his report.   6. HL: on simvastatin 20mg   Signed, Reino Bellis, NP-C Pager [phone] 09/06/2017, 3:44 PM

## 2017-09-06 NOTE — Progress Notes (Signed)
    Subjective: Patient reports pain as mild to moderate.  Tolerating some diet.  No CP, SOB.  Not yet OOB.  Objective:   VITALS:   Vitals:   09/05/17 2118 09/05/17 2211 09/06/17 0140 09/06/17 0559  BP: (!) 112/56 (!) 95/58 (!) 101/53 (!) 107/51  Pulse: 87 (!) 102 (!) 103 (!) 106  Resp: 19  17 17   Temp: 98.7 F (37.1 C)  99.1 F (37.3 C) 98.6 F (37 C)  TempSrc: Oral   Oral  SpO2: 97%  98% 97%  Weight:      Height:       CBC Latest Ref Rng & Units 09/05/2017 09/04/2017 09/04/2017  WBC 4.0 - 10.5 K/uL 8.1 10.8(H) 12.5(H)  Hemoglobin 13.0 - 17.0 g/dL 9.8(L) 11.3(L) 12.3(L)  Hematocrit 39.0 - 52.0 % 28.7(L) 33.1(L) 36.3(L)  Platelets 150 - 400 K/uL 204 255 258   BMP Latest Ref Rng & Units 09/05/2017 09/04/2017 09/04/2017  Glucose 65 - 99 mg/dL 151(H) - 129(H)  BUN 6 - 20 mg/dL 19 - 17  Creatinine 0.61 - 1.24 mg/dL 1.12 1.04 1.14  Sodium 135 - 145 mmol/L 138 - 138  Potassium 3.5 - 5.1 mmol/L 4.0 - 3.6  Chloride 101 - 111 mmol/L 105 - 102  CO2 22 - 32 mmol/L 23 - 24  Calcium 8.9 - 10.3 mg/dL 8.1(L) - 9.0   Intake/Output      02/04 0701 - 02/05 0700   P.O. 360   I.V. (mL/kg) 900 (10.9)   IV Piggyback 250   Total Intake(mL/kg) 1510 (18.2)   Urine (mL/kg/hr) 100 (0.1)   Blood 100   Total Output 200   Net +1310          Physical Exam: General: NAD.  Supine in bed.  Calm, conversant.  Wife at bedside.  No increased wob.  MSK Neurovascularly intact Sensation intact distally Feet warm Dorsiflexion/Plantar flexion intact Incision: dressing w/ few areas of scant bloody drainage   Assessment: 1 Day Post-Op  S/P Procedure(s) (LRB): INTRAMEDULLARY (IM) NAIL INTERTROCHANTRIC (Left) by Dr. Ernesta Amble. Percell Miller on 09/05/17  Active Problems:   Hypertension   Arteriosclerotic cardiovascular disease (ASCVD)   Hypercholesteremia   COPD (chronic obstructive pulmonary disease) (HCC)   Hip fracture (HCC)  Closed comminuted displaced left intertrochanteric femur fracture Doing well  POD1.  Pain controlled, improved.  Not yet OOB  Plan:  Up with therapy Incentive Spirometry Apply ice  Weight Bearing: Weight Bearing as Tolerated (WBAT)  Dressings: Maintain Mepilex.  VTE prophylaxis: Aspirin, SCDs, ambulation Dispo: TBD.  Previously ambulatory without aid.  The patient was living with his wife in a one-story residence.  No stairs to negotiate.  He has a great desire to return home from the hospital w/ HHPT if deemed appropriate.   Larry Burly III, PA-C 09/06/2017, 6:39 AM

## 2017-09-06 NOTE — Progress Notes (Signed)
Received call from lab second Troponin level 0.03. MD notified.

## 2017-09-06 NOTE — Progress Notes (Signed)
Pt urine output is 100 at 2230. Asked to void rt now and pt said he wants to sleep a little bit more. Stated "I will try to urinate soon but not rt now, as I don't have any problem with that". Will try to encourage again soon.

## 2017-09-06 NOTE — Progress Notes (Signed)
PROGRESS NOTE    Larry Briggs  TDV:[id number] DOB: April 04, 1936 DOA: 09/04/2017 PCP: [name], MD   Brief Narrative:  Patient is a 82 y.o. y/o male with history of coronary artery disease, COPD, chronic lower back pain secondary to arthritis, presents to the ED with left hip fracture.  He reports while putting on his pant, he tripped and fell on the floor, denies hitting his head or lose consciousness. He was brought to ER and orthopedics was contacted for surgery on 2/4.  Patient was admitted for observation post left hip fracture ORIF surgery.   Assessment & Plan:  Left intertrochanteric hip fracture s/p ORIF -Patient reports mild left hip pain post surgery, denies chest pain, trouble breathing, abdominal pain -Per orthopedics, weightbearing as tolerated, will be on DVT prophylaxis for 4 weeks -Starting PT today, will continue follow up with PT sessions in hospital before discharging to SNF  Coronary artery disease s/p CABG/PCI -Denies chest pain, no clinical sign of exacerbation.  Will continue aspirin  Hypercholesteremia -Continue simvastatin  Hypertension -BP stable, well-controlled. Continue metoprolol succinate.   COPD -No clinical sign of exacerbation.  SpO2 97% on room air.  Continue Dulera   DVT prophylaxis: Lovenox Code Status:  Full  Family Communication:  Wife at bedside Disposition Plan:  SNF upon discharge   Consultants:   Orthopedics  Procedures: Antimicrobials:   Subjective: Patient appears calm, alert, oriented. Denies fever/chill, dizziness, chest pain, cough, difficulty breathing, abdominal pain.  Report mild left hip pain.  Wife inquiring PT and diet order.   Objective: Vitals:   09/06/17 0140 09/06/17 0559 09/06/17 0806 09/06/17 1004  BP: (!) 101/53 (!) 107/51  102/70  Pulse: (!) 103 (!) 106  (!) 121  Resp: 17 17  18   Temp: 99.1 F (37.3 C) 98.6 F (37 C)  98.2 F (36.8 C)  TempSrc:  Oral  Oral  SpO2: 98% 97% 95% 97%  Weight:       Height:        Intake/Output Summary (Last 24 hours) at 09/06/2017 1120 Last data filed at 09/06/2017 0910 Gross per 24 hour  Intake 1870 ml  Output 525 ml  Net 1345 ml   Filed Weights   09/04/17 0928 09/04/17 1810  Weight: 81.6 kg (180 lb) 82.8 kg (182 lb 8.7 oz)    Examination:  General exam: Appears calm and comfortable   E ENT: No icterus, oral mucosa moist. Respiratory system: Clear to auscultation. Respiratory effort normal. No wheezing or crackle Cardiovascular system: S1 & S2 heard, RRR.  No pedal edema. Gastrointestinal system: Abdomen is nondistended, soft and nontender. Normal bowel sounds heard. Central nervous system: Alert and oriented. No focal neurological deficits. Musculoskeletal/Extremities: No joint deformities  Skin: No rashes, lesions or ulcers. Dry and warm to touch  Data Reviewed: I have personally reviewed following labs and imaging studies  CBC: Recent Labs  Lab 09/04/17 1047 09/04/17 1813 09/05/17 0511 09/06/17 0650  WBC 12.5* 10.8* 8.1 10.4  NEUTROABS 11.0*  --   --   --   HGB 12.3* 11.3* 9.8* 8.2*  HCT 36.3* 33.1* 28.7* 23.5*  MCV 92.1 92.2 92.3 90.0  PLT 258 255 204 062   Basic Metabolic Panel: Recent Labs  Lab 09/04/17 1047 09/04/17 1813 09/05/17 0511 09/06/17 0650  NA 138  --  138 137  K 3.6  --  4.0 4.0  CL 102  --  105 100*  CO2 24  --  23 23  GLUCOSE 129*  --  151* 171*  BUN 17  --  19 21*  CREATININE 1.14 1.04 1.12 1.13  CALCIUM 9.0  --  8.1* 8.3*   GFR: Estimated Creatinine Clearance: 54.7 mL/min (by C-G formula based on SCr of 1.13 mg/dL). Liver Function Tests: Recent Labs  Lab 09/04/17 1047  AST 25  ALT 17  ALKPHOS 80  BILITOT 1.1  PROT 6.1*  ALBUMIN 3.5   No results for input(s): LIPASE, AMYLASE in the last 168 hours. No results for input(s): AMMONIA in the last 168 hours. Coagulation Profile: Recent Labs  Lab 09/04/17 1047  INR 1.05   Cardiac Enzymes: No results for input(s): CKTOTAL, CKMB,  CKMBINDEX, TROPONINI in the last 168 hours. BNP (last 3 results) No results for input(s): PROBNP in the last 8760 hours. HbA1C: No results for input(s): HGBA1C in the last 72 hours. CBG: No results for input(s): GLUCAP in the last 168 hours. Lipid Profile: No results for input(s): CHOL, HDL, LDLCALC, TRIG, CHOLHDL, LDLDIRECT in the last 72 hours. Thyroid Function Tests: No results for input(s): TSH, T4TOTAL, FREET4, T3FREE, THYROIDAB in the last 72 hours. Anemia Panel: No results for input(s): VITAMINB12, FOLATE, FERRITIN, TIBC, IRON, RETICCTPCT in the last 72 hours. Sepsis Labs: No results for input(s): PROCALCITON, LATICACIDVEN in the last 168 hours.  Recent Results (from the past 240 hour(s))  Urine Culture     Status: None   Collection Time: 09/05/17  4:22 AM  Result Value Ref Range Status   Specimen Description URINE, CLEAN CATCH  Final   Special Requests NONE  Final   Culture   Final    NO GROWTH Performed at [facility], [address], [place], [place] [address]    Report Status 09/06/2017 FINAL  Final  Surgical pcr screen     Status: None   Collection Time: 09/05/17  7:50 AM  Result Value Ref Range Status   MRSA, PCR NEGATIVE NEGATIVE Final   Staphylococcus aureus NEGATIVE NEGATIVE Final    Comment: (NOTE) The Xpert SA Assay (FDA approved for NASAL specimens in patients 66 years of age and older), is one component of a comprehensive surveillance program. It is not intended to diagnose infection nor to guide or monitor treatment. Performed at [facility], [place] [address], [place], [place] [address]          Radiology Studies: Pelvis Portable  Result Date: 09/05/2017 CLINICAL DATA:  Fracture repair EXAM: PORTABLE PELVIS 1-2 VIEWS COMPARISON:  None. FINDINGS: A gamma nail and rod have been placed across the left hip fracture. Visualize portions of the hardware are in good position. No other changes. IMPRESSION: A gamma nail and intramedullary rod  have been placed across the known left hip fracture. Electronically Signed   By: Dorise Bullion III M.D   On: 09/05/2017 19:24   Dg C-arm 1-60 Min  Result Date: 09/05/2017 CLINICAL DATA:  82 year old male with left femur fracture. Subsequent encounter. EXAM: LEFT FEMUR 2 VIEWS; DG C-ARM 61-120 MIN COMPARISON:  09/04/2017. FINDINGS: Four C-arm views submitted for review after surgery. Open reduction and internal fixation of left intertrochanteric fracture with long stem femoral rod with distal fixation screw and sliding-type proximal screw. Complete intramedullary rod not imaged. Portions imaged unremarkable. IMPRESSION: Open reduction and internal fixation of left intertrochanteric fracture. Electronically Signed   By: Genia Del M.D.   On: 09/05/2017 16:37   Dg Femur Min 2 Views Left  Result Date: 09/05/2017 CLINICAL DATA:  82 year old male with left femur fracture. Subsequent  encounter. EXAM: LEFT FEMUR 2 VIEWS; DG C-ARM 61-120 MIN COMPARISON:  09/04/2017. FINDINGS: Four C-arm views submitted for review after surgery. Open reduction and internal fixation of left intertrochanteric fracture with long stem femoral rod with distal fixation screw and sliding-type proximal screw. Complete intramedullary rod not imaged. Portions imaged unremarkable. IMPRESSION: Open reduction and internal fixation of left intertrochanteric fracture. Electronically Signed   By: Genia Del M.D.   On: 09/05/2017 16:37        Scheduled Meds: . aspirin EC  325 mg Oral Q breakfast  . docusate sodium  100 mg Oral BID  . metoprolol succinate  50 mg Oral QHS  . mometasone-formoterol  2 puff Inhalation BID  . senna  1 tablet Oral BID  . simvastatin  20 mg Oral QHS  . tamsulosin  0.4 mg Oral QPC supper   Continuous Infusions: . lactated ringers Stopped (09/05/17 1615)  . methocarbamol (ROBAXIN)  IV       LOS: 2 days    Jo-ku Elta [place]) Mervyn Skeeters, PA-S

## 2017-09-06 NOTE — Social Work (Signed)
Pt wife approached Air traffic controller on unit and expressed concerns regarding PT visit. CSW approached and introduced herself and was postiively acknowledged by wife. CSW spoke with pt wife regarding SNF recommendation by PT. Pt wife is concerned that this is the only thing that they can do is accept rehab placement. Pt wife expressed tearfully that she would really like pt to go home.   CSW explained that SNF is only a recommendation and that HH/PT is always an option. CSW spoke with pt wife about 3 night stay Medicare requirement and that pt would be able to access SNF after discharge if HH/PT was not enough for pt/pt wife to feel safe about caring for pt at home.   CSW continuing to follow to support pt and pt family as they continue to make care decisions.  [place], [place] Work [phone]

## 2017-09-06 NOTE — Progress Notes (Signed)
Physical Therapy Treatment Patient Details Name: Larry Briggs MRN: [id number] DOB: 11-Sep-1935 Today's Date: 09/06/2017    History of Present Illness 82yo male who fell while donning pants, had immediate pain and inability to bear weight. X-ray showed L intertrochanteric femur fracture. He received surgery for L IM nail placement on 09/05/17. PMH CAD, hx MI, HTN, obesity, hx CABG, coronary stent placement, cardiac cath     PT Comments    Returned for second session as RN reports patient's wife is requesting exercises that he can do in the bed between PT sessions. Patient sleeping soundly in bed, did not attempt mobility this session and focused on education of wife regarding bed level exercises. Demonstrated ankle pumps, quad sets, supine hip abduction, and heel slides for him to work on for now, wife able to return demonstration today as well; advised not to attempt seated/standing exercises on their own yet due to heavy level of assist/for patient and family safety.  Wife voices and demonstrates understanding of all education and exercises provided today, plan to progress exercise program as patient is able to safely do so.    Follow Up Recommendations  SNF     Equipment Recommendations  None recommended by PT(defer to next venue )    Recommendations for Other Services       Precautions / Restrictions Precautions Precautions: Fall Restrictions Weight Bearing Restrictions: Yes LLE Weight Bearing: Weight bearing as tolerated    Mobility  Bed Mobility Overal bed mobility: Needs Assistance Bed Mobility: Supine to Sit;Sit to Supine     Supine to sit: Mod assist Sit to supine: Mod assist   General bed mobility comments: for management of L LE, to bring trunk up; O2 desat to 86% on room air, recovered to 95% with pursed lip breathing   Transfers Overall transfer level: Needs assistance Equipment used: Rolling walker (2 wheeled) Transfers: Sit to/from Stand Sit to Stand: Mod  assist         General transfer comment: cues for safety and sequencing; able to reach full stand on first attempt but HR to 130s so sat back down. Unable to perform on second attempt due to fatigue   Ambulation/Gait             General Gait Details: unable    Stairs            Wheelchair Mobility    Modified Rankin (Stroke Patients Only)       Balance Overall balance assessment: Needs assistance Sitting-balance support: Bilateral upper extremity supported;Feet supported Sitting balance-Leahy Scale: Good     Standing balance support: Bilateral upper extremity supported;During functional activity Standing balance-Leahy Scale: Poor Standing balance comment: reliant on UE support                             Cognition Arousal/Alertness: Lethargic Behavior During Therapy: WFL for tasks assessed/performed Overall Cognitive Status: Within Functional Limits for tasks assessed                                 General Comments: sleeping in bed, this session focused on education with spouse       Exercises      General Comments        Pertinent Vitals/Pain Pain Assessment: No/denies pain    Home Living Family/patient expects to be discharged to:: Private residence Living Arrangements: Spouse/significant other Available Help  at Discharge: Family;Available 24 hours/day Type of Home: House Home Access: Stairs to enter Entrance Stairs-Rails: None Home Layout: One level Home Equipment: None      Prior Function Level of Independence: Independent          PT Goals (current goals can now be found in the care plan section) Acute Rehab PT Goals Patient Stated Goal: to go home  PT Goal Formulation: With patient/family Time For Goal Achievement: 09/13/17 Potential to Achieve Goals: Fair Progress towards PT goals: Progressing toward goals    Frequency    Min 3X/week      PT Plan      Co-evaluation               AM-PAC PT "6 Clicks" Daily Activity  Outcome Measure  Difficulty turning over in bed (including adjusting bedclothes, sheets and blankets)?: Unable Difficulty moving from lying on back to sitting on the side of the bed? : Unable Difficulty sitting down on and standing up from a chair with arms (e.g., wheelchair, bedside commode, etc,.)?: Unable Help needed moving to and from a bed to chair (including a wheelchair)?: A Lot Help needed walking in hospital room?: A Lot Help needed climbing 3-5 steps with a railing? : Total 6 Click Score: 8    End of Session Equipment Utilized During Treatment: Gait belt Activity Tolerance: Patient limited by fatigue Patient left: in bed;with bed alarm set;with call bell/phone within reach;with family/visitor present Nurse Communication: Other (comment);Mobility status(HR during session ) PT Visit Diagnosis: Unsteadiness on feet (R26.81);Muscle weakness (generalized) (M62.81);Difficulty in walking, not elsewhere classified (R26.2);History of falling (Z91.81);Pain Pain - Right/Left: Left Pain - part of body: Leg     Time: 1135-1145 PT Time Calculation (min) (ACUTE ONLY): 10 min  Charges:  $Self Care/Home Management: 8-22                    G Codes:       Deniece Ree PT, DPT, CBIS  Supplemental Physical Therapist [place]   Pager [phone]

## 2017-09-06 NOTE — Evaluation (Signed)
Physical Therapy Evaluation Patient Details Name: Larry Briggs MRN: [id number] DOB: [birth year] Today's Date: 09/06/2017   History of Present Illness  82yo male who fell while donning pants, had immediate pain and inability to bear weight. X-ray showed L intertrochanteric femur fracture. He received surgery for L IM nail placement on 09/05/17. PMH CAD, hx MI, HTN, obesity, hx CABG, coronary stent placement, cardiac cath   Clinical Impression   Patient received in bed, pleasant and willing to participate in PT evaluation this morning. Note HR 110-120 at rest in supine. He requires ModA for functional bed mobility, following transfer O2 dropped to 86% but recovered back to 95% on room air with pursed lip breathing. Able to perform sit to stand with RW and ModA from elevated surface but in static standing HR increased to 137, requiring return to seated position for safety; HR did reduce back to 110-120s with rest however at this point patient fatigued and unable to complete second transfer with assist of just +1, required ModA to return to bed.  Session greatly limited by fatigue, pain, and HR/O2 limitations today, recommend +2 assist moving forward for safety. RN aware of HR fluctuations and response to activity during PT. At this point patient will likely benefit from short term rehab in the SNF setting moving forward to further address functional impairments and reduce overall fall risk/optimize safety prior to return home.     Follow Up Recommendations SNF    Equipment Recommendations  None recommended by PT(defer to next venue )    Recommendations for Other Services       Precautions / Restrictions Precautions Precautions: Fall Restrictions Weight Bearing Restrictions: Yes LLE Weight Bearing: Weight bearing as tolerated      Mobility  Bed Mobility Overal bed mobility: Needs Assistance Bed Mobility: Supine to Sit;Sit to Supine     Supine to sit: Mod assist Sit to supine: Mod  assist   General bed mobility comments: for management of L LE, to bring trunk up; O2 desat to 86% on room air, recovered to 95% with pursed lip breathing   Transfers Overall transfer level: Needs assistance Equipment used: Rolling walker (2 wheeled) Transfers: Sit to/from Stand Sit to Stand: Mod assist         General transfer comment: cues for safety and sequencing; able to reach full stand on first attempt but HR to 130s so sat back down. Unable to perform on second attempt due to fatigue   Ambulation/Gait             General Gait Details: unable   Stairs            Wheelchair Mobility    Modified Rankin (Stroke Patients Only)       Balance Overall balance assessment: Needs assistance Sitting-balance support: Bilateral upper extremity supported;Feet supported Sitting balance-Leahy Scale: Good     Standing balance support: Bilateral upper extremity supported;During functional activity Standing balance-Leahy Scale: Poor Standing balance comment: reliant on UE support                              Pertinent Vitals/Pain Pain Assessment: No/denies pain    Home Living Family/patient expects to be discharged to:: Private residence Living Arrangements: Spouse/significant other Available Help at Discharge: Family;Available 24 hours/day Type of Home: House Home Access: Stairs to enter Entrance Stairs-Rails: None Entrance Stairs-Number of Steps: 1 Home Layout: One level Home Equipment: None      Prior  Function Level of Independence: Independent               Hand Dominance        Extremity/Trunk Assessment   Upper Extremity Assessment Upper Extremity Assessment: Defer to OT evaluation    Lower Extremity Assessment Lower Extremity Assessment: Generalized weakness    Cervical / Trunk Assessment Cervical / Trunk Assessment: Normal  Communication   Communication: No difficulties  Cognition Arousal/Alertness:  Awake/alert Behavior During Therapy: WFL for tasks assessed/performed Overall Cognitive Status: Within Functional Limits for tasks assessed                                        General Comments      Exercises     Assessment/Plan    PT Assessment Patient needs continued PT services  PT Problem List Decreased strength;Decreased mobility;Decreased safety awareness;Decreased coordination;Decreased activity tolerance;Decreased balance;Pain;Decreased knowledge of use of DME       PT Treatment Interventions DME instruction;Therapeutic activities;Gait training;Therapeutic exercise;Patient/family education;Stair training;Balance training;Functional mobility training;Neuromuscular re-education    PT Goals (Current goals can be found in the Care Plan section)  Acute Rehab PT Goals Patient Stated Goal: to go home  PT Goal Formulation: With patient/family Time For Goal Achievement: 09/13/17 Potential to Achieve Goals: Fair    Frequency Min 3X/week   Barriers to discharge        Co-evaluation               AM-PAC PT "6 Clicks" Daily Activity  Outcome Measure Difficulty turning over in bed (including adjusting bedclothes, sheets and blankets)?: Unable Difficulty moving from lying on back to sitting on the side of the bed? : Unable Difficulty sitting down on and standing up from a chair with arms (e.g., wheelchair, bedside commode, etc,.)?: Unable Help needed moving to and from a bed to chair (including a wheelchair)?: A Lot Help needed walking in hospital room?: A Lot Help needed climbing 3-5 steps with a railing? : Total 6 Click Score: 8    End of Session Equipment Utilized During Treatment: Gait belt Activity Tolerance: Patient limited by fatigue Patient left: in bed;with bed alarm set;with call bell/phone within reach;with family/visitor present Nurse Communication: Other (comment);Mobility status(HR during session ) PT Visit Diagnosis: Unsteadiness on  feet (R26.81);Muscle weakness (generalized) (M62.81);Difficulty in walking, not elsewhere classified (R26.2);History of falling (Z91.81);Pain Pain - Right/Left: Left Pain - part of body: Leg    Time: 6122-4497 PT Time Calculation (min) (ACUTE ONLY): 33 min   Charges:   PT Evaluation $PT Eval Low Complexity: 1 Low PT Treatments $Self Care/Home Management: 8-22   PT G Codes:        Deniece Ree PT, DPT, CBIS  Supplemental Physical Therapist [place]   Pager [phone]

## 2017-09-07 ENCOUNTER — Inpatient Hospital Stay (HOSPITAL_COMMUNITY): Payer: Medicare Other

## 2017-09-07 ENCOUNTER — Encounter (HOSPITAL_COMMUNITY): Payer: Self-pay | Admitting: Orthopedic Surgery

## 2017-09-07 ENCOUNTER — Other Ambulatory Visit: Payer: Self-pay

## 2017-09-07 DIAGNOSIS — E7800 Pure hypercholesterolemia, unspecified: Secondary | ICD-10-CM

## 2017-09-07 DIAGNOSIS — I5030 Unspecified diastolic (congestive) heart failure: Secondary | ICD-10-CM

## 2017-09-07 DIAGNOSIS — I4891 Unspecified atrial fibrillation: Secondary | ICD-10-CM

## 2017-09-07 LAB — ECHOCARDIOGRAM COMPLETE
HEIGHTINCHES: 70 in
WEIGHTICAEL: 2920.65 [oz_av]

## 2017-09-07 LAB — BASIC METABOLIC PANEL
Anion gap: 12 (ref 5–15)
BUN: 30 mg/dL — AB (ref 6–20)
CHLORIDE: 98 mmol/L — AB (ref 101–111)
CO2: 23 mmol/L (ref 22–32)
Calcium: 8.1 mg/dL — ABNORMAL LOW (ref 8.9–10.3)
Creatinine, Ser: 1.3 mg/dL — ABNORMAL HIGH (ref 0.61–1.24)
GFR calc Af Amer: 59 mL/min — ABNORMAL LOW (ref >60)
GFR calc non Af Amer: 51 mL/min — ABNORMAL LOW (ref >60)
GLUCOSE: 134 mg/dL — AB (ref 65–99)
POTASSIUM: 3.7 mmol/L (ref 3.5–5.1)
Sodium: 133 mmol/L — ABNORMAL LOW (ref 135–145)

## 2017-09-07 LAB — TROPONIN I
Troponin I: <0.03 ng/mL (ref <0.03)
Troponin I: <0.03 ng/mL (ref <0.03)

## 2017-09-07 LAB — MAGNESIUM: Magnesium: 2.1 mg/dL (ref 1.7–2.4)

## 2017-09-07 LAB — CBC
HCT: 20.5 % — ABNORMAL LOW (ref 39.0–52.0)
HEMOGLOBIN: 7.1 g/dL — AB (ref 13.0–17.0)
MCH: 30.9 pg (ref 26.0–34.0)
MCHC: 34.6 g/dL (ref 30.0–36.0)
MCV: 89.1 fL (ref 78.0–100.0)
Platelets: 174 10*3/uL (ref 150–400)
RBC: 2.3 MIL/uL — AB (ref 4.22–5.81)
RDW: 13.2 % (ref 11.5–15.5)
WBC: 9.8 10*3/uL (ref 4.0–10.5)

## 2017-09-07 LAB — PREPARE RBC (CROSSMATCH)

## 2017-09-07 MED ORDER — SODIUM CHLORIDE 0.9 % IV SOLN
Freq: Once | INTRAVENOUS | Status: AC
  Administered 2017-09-07: 09:00:00 via INTRAVENOUS

## 2017-09-07 NOTE — Care Management Note (Signed)
Case Management Note  Patient Details  Name: Larry Briggs MRN: [id number] Date of Birth: [birth year]  Subjective/Objective:                    Action/Plan:  Patient and wife Larry Briggs would like patient to go home at discharge with home health. Discussed with wife. Explained [facility] program . Wife is very interested in seeing if patient qualifies for same. Referral given to Adela Lank with [facility] . Awaiting call back to see if patient qualifies .   Will need home health orders and face to face. Expected Discharge Date:                  Expected Discharge Plan:  [place]  In-House Referral:     Discharge planning Services  CM Consult  Post Acute Care Choice:  Durable Medical Equipment, Home Health Choice offered to:  Spouse, Patient  DME Arranged:  3-N-1, Walker rolling DME Agency:  [place]:    [place]:  [place], [place]  Status of Service:  In process, will continue to follow  If discussed at Long Length of Stay Meetings, dates discussed:    Additional Comments:  Marilu Favre, RN 09/07/2017, 3:20 PM

## 2017-09-07 NOTE — Progress Notes (Signed)
EKG completed for patient. Placed in patients chart.

## 2017-09-07 NOTE — Progress Notes (Signed)
Occupational Therapy Treatment Patient Details Name: Larry Briggs MRN: [id number] DOB: May 11, 1936 Today's Date: 09/07/2017    History of present illness 82yo male who fell while donning pants, had immediate pain and inability to bear weight. X-ray showed L intertrochanteric femur fracture. He received surgery for L IM nail placement on 09/05/17. PMH CAD, hx MI, HTN, obesity, hx CABG, coronary stent placement, cardiac cath    OT comments  Pt demonstrating good progress toward OT goals this session. He was able to complete stand-pivot toilet transfer this session with min assist for power up and dynamic balance. Facilitated improved standing balance (static and dynamic) with functional reaching tasks and standing without UE support in preparation for improved standing grooming tasks and standing toileting hygiene tasks. Additionally, facilitated improved functional strength of B UE for RW use with chair push-ups. Pt with brief SpO2 desaturation to 85% with quick rebound to 95% on RA with pursed lip breathing techniques. HR up to 130 with activity today and with 3 minutes of rest returned to 115 bpm. Feel pt is an excellent candidate for [facility] program if able to qualify. Will continue to follow while admitted.    Follow Up Recommendations  Home health OT;Supervision/Assistance - 24 hour    Equipment Recommendations  3 in 1 bedside commode    Recommendations for Other Services      Precautions / Restrictions Precautions Precautions: Fall Restrictions Weight Bearing Restrictions: Yes LLE Weight Bearing: Weight bearing as tolerated       Mobility Bed Mobility Overal bed mobility: Needs Assistance Bed Mobility: Supine to Sit     Supine to sit: Min assist     General bed mobility comments: Assist for LLE only.  Transfers Overall transfer level: Needs assistance Equipment used: Rolling walker (2 wheeled) Transfers: Sit to/from Stand Sit to Stand: Min assist;From elevated  surface         General transfer comment: Verbal reminders for safe hand placement. Min assist to power up and for balance once on feet.    Balance Overall balance assessment: Needs assistance Sitting-balance support: Bilateral upper extremity supported;Feet supported Sitting balance-Leahy Scale: Good     Standing balance support: Bilateral upper extremity supported;During functional activity Standing balance-Leahy Scale: Poor Standing balance comment: requires min assist without UE support                           ADL either performed or assessed with clinical judgement   ADL Overall ADL's : Needs assistance/impaired                     Lower Body Dressing: Maximal assistance;Sit to/from stand   Toilet Transfer: Minimal assistance;Stand-pivot;RW Toilet Transfer Details (indicate cue type and reason): Simulated from bed to chair and taking a few pivotal steps.            General ADL Comments: Facilitated improved standing balance (static and dynamic) in preparation for standing toileting hygiene and grooming tasks. He was able to stand without UE support with min assist from therapist for balance to complete functional reach tasks.      Vision   Vision Assessment?: No apparent visual deficits   Perception     Praxis      Cognition Arousal/Alertness: Awake/alert Behavior During Therapy: WFL for tasks assessed/performed Overall Cognitive Status: Within Functional Limits for tasks assessed  General Comments: Highly motivated to return to PLOF        Exercises Exercises: General Upper Extremity General Exercises - Upper Extremity Chair Push Up: Strengthening;5 reps   Shoulder Instructions       General Comments Wife present and engaged in session.     Pertinent Vitals/ Pain       Pain Assessment: Faces Faces Pain Scale: Hurts a little bit Pain Location: L hip Pain Descriptors / Indicators:  Operative site guarding Pain Intervention(s): Limited activity within patient's tolerance;Monitored during session;Repositioned  Home Living                                          Prior Functioning/Environment              Frequency  Min 3X/week        Progress Toward Goals  OT Goals(current goals can now be found in the care plan section)  Progress towards OT goals: Progressing toward goals  Acute Rehab OT Goals Patient Stated Goal: to go home  OT Goal Formulation: With patient Time For Goal Achievement: 09/20/17 Potential to Achieve Goals: Good  Plan Discharge plan remains appropriate    Co-evaluation                 AM-PAC PT "6 Clicks" Daily Activity     Outcome Measure   Help from another person eating meals?: A Little Help from another person taking care of personal grooming?: A Little Help from another person toileting, which includes using toliet, bedpan, or urinal?: A Lot Help from another person bathing (including washing, rinsing, drying)?: A Lot Help from another person to put on and taking off regular upper body clothing?: A Little Help from another person to put on and taking off regular lower body clothing?: A Lot 6 Click Score: 15    End of Session Equipment Utilized During Treatment: Rolling walker  OT Visit Diagnosis: Other abnormalities of gait and mobility (R26.89);Muscle weakness (generalized) (M62.81);Pain Pain - Right/Left: Left Pain - part of body: Hip   Activity Tolerance Patient tolerated treatment well   Patient Left in bed;with bed alarm set;with family/visitor present;with call bell/phone within reach   Nurse Communication Mobility status        Time: 8144-8185 OT Time Calculation (min): 18 min  Charges: OT General Charges $OT Visit: 1 Visit OT Treatments $Self Care/Home Management : 8-22 mins  Norman Herrlich, MS OTR/L  Pager: Vincent A Johanthan Kneeland 09/07/2017, 6:03 PM

## 2017-09-07 NOTE — Progress Notes (Signed)
  Echocardiogram 2D Echocardiogram has been performed.  Merrie Roof F 09/07/2017, 2:53 PM

## 2017-09-07 NOTE — Progress Notes (Addendum)
PROGRESS NOTE    Larry Briggs  DJS:[id number] DOB: May 02, 1936 DOA: 09/04/2017 PCP: [name], MD   Brief Narrative: 82 year old gentleman with history of coronary artery disease status post CABG in 1995, stent placed in 2004, cardiac cath in 2012, COPD, chronic lower back pain secondary to arthritis, had a fall at home while putting on his paints and sustained left hip injury.  Patient was evaluated by orthopedics and underwent  left hip surgery on 09/05/2017.  Assessment & Plan:   #Fall sustaining closed comminuted displaced left intertrochanteric femur fracture status post surgery on 09/05/2017: -Continue Robaxin.  Hydrocodone and morphine as needed. -Docusate  and MiraLAX for stool softener. -PT OT evaluation. -Patient and his wife declining rehab for SNF.  Likely going home with home care services on discharge.  Orthopedics consult appreciated. -Weightbearing as tolerated.  Aspirin SCD and ambulation for DVT prophylaxis  #Sinus tachycardia with lateral T wave inversion: Patient now with new onset A. fib with RVR likely in the setting of surgical stress and anemia.  Currently on metoprolol.  Follow-up echocardiogram.  TSH level acceptable.  Mild elevated troponin likely demand ischemia.  Cardiology consult appreciated.  Not a candidate for anticoagulation.  Continue telemetry monitoring.  I have discussed medical condition and plan of care with the patient and his wife at bedside.  #History of coronary artery disease a status post CABG in 1995, stent placement.  Continue aspirin, metoprolol, Zocor.  Patient has no chest pain or shortness of breath.  #Acute blood loss anemia: Hemoglobin dropped today further.  No bruises or sign of active bleeding except drop in hemoglobin.  I ordered 2 units of red blood cell transfusion.  Discussed with the patient and wife.  Continue to monitor.  Iron saturation acceptable.  #Hyperlipidemia: Continue statin.  #History of hypertension:  Currently  on long-acting metoprolol.  Continue to monitor.  #History of COPD: Stable now currently on room air.  Bronchodilators as needed.  #Elevation in serum creatinine level noticed likely in the setting of tachycardia and anemia.  Repeat BMP in the morning.  Check UA.  DVT prophylaxis: SCD.  On aspirin for orthopedic surgeon Code Status: Full code Family Communication: Discussed with the patient's wife at bedside Disposition Plan: Likely discharge with home care services in 1-2 days    Consultants:   Cardiology  Orthopedics  Procedures: Orthopedic surgery Antimicrobials: None  Subjective: Seen and examined at bedside.  Denied recommendations, nausea vomiting chest pain shortness of breath.  Wife at bedside.  No pain.  Objective: Vitals:   09/07/17 1007 09/07/17 1132 09/07/17 1145 09/07/17 1215  BP:  (!) 131/101 (!) 131/99 95/61  Pulse:  (!) 126 95 92  Resp:  18  18  Temp:  98.3 F (36.8 C) 98.3 F (36.8 C) 98.5 F (36.9 C)  TempSrc:  Oral Oral Oral  SpO2: 98% 96% 96% 96%  Weight:      Height:        Intake/Output Summary (Last 24 hours) at 09/07/2017 1330 Last data filed at 09/07/2017 1132 Gross per 24 hour  Intake 300 ml  Output 175 ml  Net 125 ml   Filed Weights   09/04/17 0928 09/04/17 1810  Weight: 81.6 kg (180 lb) 82.8 kg (182 lb 8.7 oz)    Examination:  General exam: Not in distress Respiratory system: Clear bilateral, respiratory for normal Cardiovascular system: Regular rate rhythm S1-S2 normal.  No pedal edema. Gastrointestinal system: Abdomen soft, nontender.  Bowel sounds positive Central nervous system: Alert  awake and following commands Skin: No rashes, lesions or ulcers Psychiatry: Judgement and insight appear normal. Mood & affect appropriate.     Data Reviewed: I have personally reviewed following labs and imaging studies  CBC: Recent Labs  Lab 09/04/17 1047 09/04/17 1813 09/05/17 0511 09/06/17 0650 09/07/17 0254  WBC 12.5* 10.8* 8.1  10.4 9.8  NEUTROABS 11.0*  --   --   --   --   HGB 12.3* 11.3* 9.8* 8.2* 7.1*  HCT 36.3* 33.1* 28.7* 23.5* 20.5*  MCV 92.1 92.2 92.3 90.0 89.1  PLT 258 255 204 191 811   Basic Metabolic Panel: Recent Labs  Lab 09/04/17 1047 09/04/17 1813 09/05/17 0511 09/06/17 0650 09/07/17 0254  NA 138  --  138 137 133*  K 3.6  --  4.0 4.0 3.7  CL 102  --  105 100* 98*  CO2 24  --  23 23 23   GLUCOSE 129*  --  151* 171* 134*  BUN 17  --  19 21* 30*  CREATININE 1.14 1.04 1.12 1.13 1.30*  CALCIUM 9.0  --  8.1* 8.3* 8.1*  MG  --   --   --   --  2.1   GFR: Estimated Creatinine Clearance: 47.6 mL/min (A) (by C-G formula based on SCr of 1.3 mg/dL (H)). Liver Function Tests: Recent Labs  Lab 09/04/17 1047  AST 25  ALT 17  ALKPHOS 80  BILITOT 1.1  PROT 6.1*  ALBUMIN 3.5   No results for input(s): LIPASE, AMYLASE in the last 168 hours. No results for input(s): AMMONIA in the last 168 hours. Coagulation Profile: Recent Labs  Lab 09/04/17 1047  INR 1.05   Cardiac Enzymes: Recent Labs  Lab 09/06/17 1435 09/06/17 2105 09/07/17 0254 09/07/17 0801  TROPONINI <0.03 0.03* <0.03 <0.03   BNP (last 3 results) No results for input(s): PROBNP in the last 8760 hours. HbA1C: No results for input(s): HGBA1C in the last 72 hours. CBG: No results for input(s): GLUCAP in the last 168 hours. Lipid Profile: No results for input(s): CHOL, HDL, LDLCALC, TRIG, CHOLHDL, LDLDIRECT in the last 72 hours. Thyroid Function Tests: Recent Labs    09/06/17 1435  TSH 0.800   Anemia Panel: Recent Labs    09/06/17 1430  FERRITIN 303  TIBC 211*  IRON 41*   Sepsis Labs: No results for input(s): PROCALCITON, LATICACIDVEN in the last 168 hours.  Recent Results (from the past 240 hour(s))  Urine Culture     Status: None   Collection Time: 09/05/17  4:22 AM  Result Value Ref Range Status   Specimen Description URINE, CLEAN CATCH  Final   Special Requests NONE  Final   Culture   Final    NO  GROWTH Performed at [facility], [address], [place], [place] [address]    Report Status 09/06/2017 FINAL  Final  Surgical pcr screen     Status: None   Collection Time: 09/05/17  7:50 AM  Result Value Ref Range Status   MRSA, PCR NEGATIVE NEGATIVE Final   Staphylococcus aureus NEGATIVE NEGATIVE Final    Comment: (NOTE) The Xpert SA Assay (FDA approved for NASAL specimens in patients 49 years of age and older), is one component of a comprehensive surveillance program. It is not intended to diagnose infection nor to guide or monitor treatment. Performed at [facility], [place] [address], [place], [place] [address]          Radiology Studies: Pelvis Portable  Result Date: 09/05/2017 CLINICAL DATA:  Fracture repair EXAM: PORTABLE PELVIS 1-2 VIEWS COMPARISON:  None. FINDINGS: A gamma nail and rod have been placed across the left hip fracture. Visualize portions of the hardware are in good position. No other changes. IMPRESSION: A gamma nail and intramedullary rod have been placed across the known left hip fracture. Electronically Signed   By: Dorise Bullion III M.D   On: 09/05/2017 19:24   Dg C-arm 1-60 Min  Result Date: 09/05/2017 CLINICAL DATA:  82 year old male with left femur fracture. Subsequent encounter. EXAM: LEFT FEMUR 2 VIEWS; DG C-ARM 61-120 MIN COMPARISON:  09/04/2017. FINDINGS: Four C-arm views submitted for review after surgery. Open reduction and internal fixation of left intertrochanteric fracture with long stem femoral rod with distal fixation screw and sliding-type proximal screw. Complete intramedullary rod not imaged. Portions imaged unremarkable. IMPRESSION: Open reduction and internal fixation of left intertrochanteric fracture. Electronically Signed   By: Genia Del M.D.   On: 09/05/2017 16:37   Dg Femur Min 2 Views Left  Result Date: 09/05/2017 CLINICAL DATA:  82 year old male with left femur fracture. Subsequent encounter. EXAM: LEFT FEMUR 2  VIEWS; DG C-ARM 61-120 MIN COMPARISON:  09/04/2017. FINDINGS: Four C-arm views submitted for review after surgery. Open reduction and internal fixation of left intertrochanteric fracture with long stem femoral rod with distal fixation screw and sliding-type proximal screw. Complete intramedullary rod not imaged. Portions imaged unremarkable. IMPRESSION: Open reduction and internal fixation of left intertrochanteric fracture. Electronically Signed   By: Genia Del M.D.   On: 09/05/2017 16:37        Scheduled Meds: . aspirin EC  325 mg Oral Q breakfast  . docusate sodium  100 mg Oral BID  . metoprolol succinate  50 mg Oral QHS  . mometasone-formoterol  2 puff Inhalation BID  . senna  1 tablet Oral BID  . simvastatin  20 mg Oral QHS  . tamsulosin  0.4 mg Oral QPC supper   Continuous Infusions: . lactated ringers Stopped (09/05/17 1615)  . methocarbamol (ROBAXIN)  IV       LOS: 3 days    Sander Speckman Tanna Furry, MD [facility] Pager [phone]  If 7PM-7AM, please contact night-coverage [email or web address] Password [email or web address] 09/07/2017, 1:30 PM

## 2017-09-07 NOTE — Progress Notes (Signed)
    Subjective: Patient remained tachycardic yesterday.  Cardiology now following.  Mobilization limited by fatigue.  Hip pain as mild to moderate.  Tolerating some diet.  No CP, SOB.    Objective:   VITALS:   Vitals:   09/06/17 2137 09/07/17 0455 09/07/17 0830 09/07/17 0845  BP:  100/60 131/64 129/67  Pulse:  90 98 (!) 112  Resp:  18 18 18   Temp: 97.9 F (36.6 C) 98.1 F (36.7 C) 98.3 F (36.8 C) 98.1 F (36.7 C)  TempSrc: Oral Oral    SpO2:  96%  96%  Weight:      Height:       CBC Latest Ref Rng & Units 09/07/2017 09/06/2017 09/05/2017  WBC 4.0 - 10.5 K/uL 9.8 10.4 8.1  Hemoglobin 13.0 - 17.0 g/dL 7.1(L) 8.2(L) 9.8(L)  Hematocrit 39.0 - 52.0 % 20.5(L) 23.5(L) 28.7(L)  Platelets 150 - 400 K/uL 174 191 204   BMP Latest Ref Rng & Units 09/07/2017 09/06/2017 09/05/2017  Glucose 65 - 99 mg/dL 134(H) 171(H) 151(H)  BUN 6 - 20 mg/dL 30(H) 21(H) 19  Creatinine 0.61 - 1.24 mg/dL 1.30(H) 1.13 1.12  Sodium 135 - 145 mmol/L 133(L) 137 138  Potassium 3.5 - 5.1 mmol/L 3.7 4.0 4.0  Chloride 101 - 111 mmol/L 98(L) 100(L) 105  CO2 22 - 32 mmol/L 23 23 23   Calcium 8.9 - 10.3 mg/dL 8.1(L) 8.3(L) 8.1(L)   Intake/Output      02/05 0701 - 02/06 0700 02/06 0701 - 02/07 0700   P.O. 600    I.V. (mL/kg)     IV Piggyback     Total Intake(mL/kg) 600 (7.2)    Urine (mL/kg/hr) 500 (0.3)    Stool 0    Blood     Total Output 500    Net +100         Stool Occurrence 1 x       Physical Exam: General: NAD.  Upright in bed.  Calm, conversant.  Wife at bedside.  No increased wob.  MSK LLE: Neurovascularly intact Sensation intact distally Feet warm Dorsiflexion/Plantar flexion intact Thigh / lateral hip with moderate swelling.  No ecchymosis.  Compressible.  Mildly tender. Incision: dressing w/ few areas of stable scant bloody drainage   Assessment: 2 Days Post-Op  S/P Procedure(s) (LRB): INTRAMEDULLARY (IM) NAIL INTERTROCHANTRIC (Left) by Dr. Ernesta Amble. Percell Miller on 09/05/17  Active  Problems:   Hypertension   Arteriosclerotic cardiovascular disease (ASCVD)   Hypercholesteremia   T wave inversion in EKG   COPD (chronic obstructive pulmonary disease) (HCC)   Hip fracture (HCC)   Acute blood loss anemia  Closed comminuted displaced left intertrochanteric femur fracture Mobilization limited by fatigue.  Anemia and / or cardiac et likely.   Pain controlled, improved.    Plan: Continue care per Medicine / Cardiology.  2 units have been ordered for anemia. Mobilize with therapy when able. Incentive Spirometry Apply ice prn  Weight Bearing: Weight Bearing as Tolerated (WBAT)  Dressings: Maintain Mepilex.  VTE prophylaxis: Aspirin, SCDs, ambulation Dispo: TBD.  Previously ambulatory without aid.  The patient was living with his wife in a one-story residence.  No stairs to negotiate.  He has a great desire to return home from the hospital w/ HHPT if deemed appropriate.  This plan is likely delayed dt medical status / fatigue.   Larry Elizabeth Martensen III, PA-C 09/07/2017, 9:21 AM

## 2017-09-07 NOTE — Progress Notes (Signed)
Physical Therapy Treatment Patient Details Name: Larry Briggs MRN: [id number] DOB: 1936/05/12 Today's Date: 09/07/2017    History of Present Illness 82yo male who fell while donning pants, had immediate pain and inability to bear weight. X-ray showed L intertrochanteric femur fracture. He received surgery for L IM nail placement on 09/05/17. PMH CAD, hx MI, HTN, obesity, hx CABG, coronary stent placement, cardiac cath     PT Comments    Pt progressing towards physical therapy goals. Overall pt reporting minimal pain and tolerated functional mobility well. Ambulation distance limited as pt was receiving blood transfusion at the time of therapy, however feel he could have tolerated a longer distance without difficulty. Updated PT recommendations to [facility], and will continue to assess as we are able to progress mobility.   Follow Up Recommendations  Home health PT([facility])     Equipment Recommendations  Rolling walker with 5" wheels;3in1 (PT)    Recommendations for Other Services       Precautions / Restrictions Precautions Precautions: Fall Restrictions Weight Bearing Restrictions: Yes LLE Weight Bearing: Weight bearing as tolerated    Mobility  Bed Mobility Overal bed mobility: Needs Assistance Bed Mobility: Supine to Sit     Supine to sit: Min assist     General bed mobility comments: Assist for LLE only. VC's for use of R railing for support when pulling himself to EOB.   Transfers Overall transfer level: Needs assistance Equipment used: Rolling walker (2 wheeled) Transfers: Sit to/from Stand Sit to Stand: Min assist;+2 physical assistance;From elevated surface         General transfer comment: VC's for hand placement on seated surface for safety. Pt was able to power-up to full stand with bilateral support from therapist and tech. Once standing, pt able to shift weight and get feet side by side.   Ambulation/Gait Ambulation/Gait assistance: Min  guard Ambulation Distance (Feet): 2 Feet Assistive device: Rolling walker (2 wheeled) Gait Pattern/deviations: Step-to pattern;Decreased stride length;Trunk flexed;Narrow base of support Gait velocity: Decreased Gait velocity interpretation: Below normal speed for age/gender General Gait Details: Pt took a few pivotal steps around to the recliner chair. Limited due to pt receiving blood at this time - feel he could have tolerated a much longer distance if therapist allowed.    Stairs            Wheelchair Mobility    Modified Rankin (Stroke Patients Only)       Balance Overall balance assessment: Needs assistance Sitting-balance support: Bilateral upper extremity supported;Feet supported Sitting balance-Leahy Scale: Good     Standing balance support: Bilateral upper extremity supported;During functional activity Standing balance-Leahy Scale: Poor Standing balance comment: reliant on UE support                             Cognition Arousal/Alertness: Awake/alert Behavior During Therapy: WFL for tasks assessed/performed Overall Cognitive Status: Within Functional Limits for tasks assessed                                        Exercises General Exercises - Lower Extremity Ankle Circles/Pumps: 10 reps Quad Sets: 10 reps Long Arc Quad: 10 reps Hip ABduction/ADduction: 10 reps    General Comments        Pertinent Vitals/Pain Pain Assessment: Faces Faces Pain Scale: Hurts a little bit Pain Descriptors /  Indicators: Operative site guarding Pain Intervention(s): Limited activity within patient's tolerance;Monitored during session;Repositioned    Home Living Family/patient expects to be discharged to:: Private residence                    Prior Function            PT Goals (current goals can now be found in the care plan section) Acute Rehab PT Goals Patient Stated Goal: to go home  PT Goal Formulation: With  patient/family Time For Goal Achievement: 09/13/17 Potential to Achieve Goals: Fair Progress towards PT goals: Progressing toward goals    Frequency    Min 5X/week      PT Plan Discharge plan needs to be updated;Frequency needs to be updated    Co-evaluation              AM-PAC PT "6 Clicks" Daily Activity  Outcome Measure  Difficulty turning over in bed (including adjusting bedclothes, sheets and blankets)?: Unable Difficulty moving from lying on back to sitting on the side of the bed? : Unable Difficulty sitting down on and standing up from a chair with arms (e.g., wheelchair, bedside commode, etc,.)?: Unable Help needed moving to and from a bed to chair (including a wheelchair)?: A Little Help needed walking in hospital room?: A Little Help needed climbing 3-5 steps with a railing? : A Little 6 Click Score: 12    End of Session Equipment Utilized During Treatment: Gait belt Activity Tolerance: Patient tolerated treatment well;Treatment limited secondary to medical complications (Comment)(Ambulation limited due to receiving blood) Patient left: in chair;with call bell/phone within reach;with family/visitor present Nurse Communication: Mobility status PT Visit Diagnosis: Unsteadiness on feet (R26.81);Pain;Difficulty in walking, not elsewhere classified (R26.2) Pain - Right/Left: Left Pain - part of body: Leg     Time: 7353-2992 PT Time Calculation (min) (ACUTE ONLY): 22 min  Charges:  $Gait Training: 8-22 mins                    G Codes:       Rolinda Roan, PT, DPT Acute Rehabilitation Services Pager: [place] 09/07/2017, 12:07 PM

## 2017-09-07 NOTE — Progress Notes (Signed)
Progress Note  Patient Name: Larry Briggs Date of Encounter: 09/07/2017  Primary Cardiologist: Peter Martinique, MD   Subjective   Feels ok today. Denies CP and dyspnea. He currently does not feel any palpitations, but notes prior h/o irregular pulse rate and fluttering in chest. No previous diagnosis of afib, per pt's recollection.   Inpatient Medications    Scheduled Meds: . aspirin EC  325 mg Oral Q breakfast  . docusate sodium  100 mg Oral BID  . metoprolol succinate  50 mg Oral QHS  . mometasone-formoterol  2 puff Inhalation BID  . senna  1 tablet Oral BID  . simvastatin  20 mg Oral QHS  . tamsulosin  0.4 mg Oral QPC supper   Continuous Infusions: . sodium chloride    . lactated ringers Stopped (09/05/17 1615)  . methocarbamol (ROBAXIN)  IV     PRN Meds: albuterol, bisacodyl, bisacodyl, HYDROcodone-acetaminophen, magnesium citrate, menthol-cetylpyridinium **OR** phenol, methocarbamol **OR** methocarbamol (ROBAXIN)  IV, morphine injection, nitroGLYCERIN, polyethylene glycol   Vital Signs    Vitals:   09/06/17 2137 09/07/17 0455 09/07/17 0830 09/07/17 0845  BP:  100/60 131/64 129/67  Pulse:  90 98 (!) 112  Resp:  18 18 18   Temp: 97.9 F (36.6 C) 98.1 F (36.7 C) 98.3 F (36.8 C) 98.1 F (36.7 C)  TempSrc: Oral Oral    SpO2:  96%  96%  Weight:      Height:        Intake/Output Summary (Last 24 hours) at 09/07/2017 0955 Last data filed at 09/06/2017 1845 Gross per 24 hour  Intake 240 ml  Output 175 ml  Net 65 ml   Filed Weights   09/04/17 0928 09/04/17 1810  Weight: 180 lb (81.6 kg) 182 lb 8.7 oz (82.8 kg)    Telemetry    Atrial fibrillation w/ RVR in the 110s-120s - Personally Reviewed  ECG    Atrial fibrillation- Personally Reviewed  Physical Exam   GEN: No acute distress.   Neck: No JVD Cardiac: irreguarlly irregular, no murmurs, rubs, or gallops.  Respiratory: Clear to auscultation bilaterally. GI: Soft, nontender, non-distended  MS: No  edema; No deformity. Neuro:  Nonfocal  Psych: Normal affect   Labs    Chemistry Recent Labs  Lab 09/04/17 1047  09/05/17 0511 09/06/17 0650 09/07/17 0254  NA 138  --  138 137 133*  K 3.6  --  4.0 4.0 3.7  CL 102  --  105 100* 98*  CO2 24  --  23 23 23   GLUCOSE 129*  --  151* 171* 134*  BUN 17  --  19 21* 30*  CREATININE 1.14   < > 1.12 1.13 1.30*  CALCIUM 9.0  --  8.1* 8.3* 8.1*  PROT 6.1*  --   --   --   --   ALBUMIN 3.5  --   --   --   --   AST 25  --   --   --   --   ALT 17  --   --   --   --   ALKPHOS 80  --   --   --   --   BILITOT 1.1  --   --   --   --   GFRNONAA 59*   < > >60 60* 51*  GFRAA >60   < > >60 >60 59*  ANIONGAP 12  --  10 14 12    < > = values in this  interval not displayed.     Hematology Recent Labs  Lab 09/05/17 0511 09/06/17 0650 09/07/17 0254  WBC 8.1 10.4 9.8  RBC 3.11* 2.61* 2.30*  HGB 9.8* 8.2* 7.1*  HCT 28.7* 23.5* 20.5*  MCV 92.3 90.0 89.1  MCH 31.5 31.4 30.9  MCHC 34.1 34.9 34.6  RDW 13.3 13.0 13.2  PLT 204 191 174    Cardiac Enzymes Recent Labs  Lab 09/06/17 1435 09/06/17 2105 09/07/17 0254  TROPONINI <0.03 0.03* <0.03   No results for input(s): TROPIPOC in the last 168 hours.   BNPNo results for input(s): BNP, PROBNP in the last 168 hours.   DDimer No results for input(s): DDIMER in the last 168 hours.   Radiology    Pelvis Portable  Result Date: 09/05/2017 CLINICAL DATA:  Fracture repair EXAM: PORTABLE PELVIS 1-2 VIEWS COMPARISON:  None. FINDINGS: A gamma nail and rod have been placed across the left hip fracture. Visualize portions of the hardware are in good position. No other changes. IMPRESSION: A gamma nail and intramedullary rod have been placed across the known left hip fracture. Electronically Signed   By: Dorise Bullion III M.D   On: 09/05/2017 19:24   Dg C-arm 1-60 Min  Result Date: 09/05/2017 CLINICAL DATA:  82 year old male with left femur fracture. Subsequent encounter. EXAM: LEFT FEMUR 2 VIEWS; DG  C-ARM 61-120 MIN COMPARISON:  09/04/2017. FINDINGS: Four C-arm views submitted for review after surgery. Open reduction and internal fixation of left intertrochanteric fracture with long stem femoral rod with distal fixation screw and sliding-type proximal screw. Complete intramedullary rod not imaged. Portions imaged unremarkable. IMPRESSION: Open reduction and internal fixation of left intertrochanteric fracture. Electronically Signed   By: Genia Del M.D.   On: 09/05/2017 16:37   Dg Femur Min 2 Views Left  Result Date: 09/05/2017 CLINICAL DATA:  82 year old male with left femur fracture. Subsequent encounter. EXAM: LEFT FEMUR 2 VIEWS; DG C-ARM 61-120 MIN COMPARISON:  09/04/2017. FINDINGS: Four C-arm views submitted for review after surgery. Open reduction and internal fixation of left intertrochanteric fracture with long stem femoral rod with distal fixation screw and sliding-type proximal screw. Complete intramedullary rod not imaged. Portions imaged unremarkable. IMPRESSION: Open reduction and internal fixation of left intertrochanteric fracture. Electronically Signed   By: Genia Del M.D.   On: 09/05/2017 16:37    Cardiac Studies   2D Echo pending.   Patient Profile     82 y.o. male with history of CAD and prior CABG in 1995, protected LM stent in 2004 and known occluded SVG to distal RCA by cath in 2012, who was admitted for mechanical fall and left trochanteric fracture. Went to OR for IM nail. Post-operatively, noted to have tachycardia. EKG shows new lateral TWI's. Pt w/o angina, but noted to have progressively worsening anemia. Also noted to be in new onset atrial fibrillation 09/06/17.  Assessment & Plan    1. Abnormal EKG: post op EKG showed new TWIs in anterolateral leads in the setting of tachycardia, and anemia. Suspected to be demand ischemia. Pt denies CP and dyspnea. Currently getting blood transfusion. Echo pending.   2. New Onset Atrial Fibrillation: may be 2/2 anemia. Pt  currently asymptomatic but reports long h/o paroxysmal palpitations at home. Hgb 7.1 today and he is getting blood transfusion. Suspect rate and arrthymia will improve once anemia is corrected. Continue home metoprolol. We can adjust if needed if rate dose not improve post transfusion. Not a candidate for a/c at this time given recent surgery  and post op anemia. We will need to monitor post discharge to see if he has persistent or recurrent afib. At that time, will re-evaluate to determine need for long term oral anticoagulation.   3. CAD: s/p CABG ('95) with LM stenting in 2004. Denies CP. New TWIs noted on EKG and new atrial arrhthymia, however suspect all related to demand ischemia in the setting of worsening anemia. His enzyme trend is reassuring. <0.03, 0.03, <0.03. Echo pending. Continue medical therapy for CAD and correct anemia and try to keep Hgb > 9.   4. Anemia: further decrease in Hgb today at 7.1, felt 2/2 acute blood loss from hip fx. Normocytic with MCV at 89. Ferritin normal. TIBC low. Iron slightly low at 41. He is currently getting Blood transfusion.  5. Trochanteric Fx: s/p IM nail. Post op management per orthop.   For questions or updates, please contact [place] Please consult [email or web address] for contact info under Cardiology/STEMI.      Signed, Lyda Jester, PA-C  09/07/2017, 9:55 AM

## 2017-09-08 DIAGNOSIS — E876 Hypokalemia: Secondary | ICD-10-CM

## 2017-09-08 DIAGNOSIS — I4891 Unspecified atrial fibrillation: Secondary | ICD-10-CM

## 2017-09-08 DIAGNOSIS — I10 Essential (primary) hypertension: Secondary | ICD-10-CM

## 2017-09-08 LAB — BASIC METABOLIC PANEL
Anion gap: 13 (ref 5–15)
BUN: 27 mg/dL — AB (ref 6–20)
CALCIUM: 8.1 mg/dL — AB (ref 8.9–10.3)
CO2: 22 mmol/L (ref 22–32)
CREATININE: 1.11 mg/dL (ref 0.61–1.24)
Chloride: 98 mmol/L — ABNORMAL LOW (ref 101–111)
GFR calc Af Amer: >60 mL/min (ref >60)
Glucose, Bld: 179 mg/dL — ABNORMAL HIGH (ref 65–99)
Potassium: 3 mmol/L — ABNORMAL LOW (ref 3.5–5.1)
SODIUM: 133 mmol/L — AB (ref 135–145)

## 2017-09-08 LAB — CBC
HCT: 27.5 % — ABNORMAL LOW (ref 39.0–52.0)
Hemoglobin: 9.5 g/dL — ABNORMAL LOW (ref 13.0–17.0)
MCH: 30 pg (ref 26.0–34.0)
MCHC: 34.5 g/dL (ref 30.0–36.0)
MCV: 86.8 fL (ref 78.0–100.0)
Platelets: 219 10*3/uL (ref 150–400)
RBC: 3.17 MIL/uL — AB (ref 4.22–5.81)
RDW: 14.7 % (ref 11.5–15.5)
WBC: 9.4 10*3/uL (ref 4.0–10.5)

## 2017-09-08 LAB — BPAM RBC
BLOOD PRODUCT EXPIRATION DATE: 201902182359
Blood Product Expiration Date: 201902122359
ISSUE DATE / TIME: 201902061139
ISSUE DATE / TIME: 201902060820
UNIT TYPE AND RH: 600
Unit Type and Rh: 6200

## 2017-09-08 LAB — TYPE AND SCREEN
ABO/RH(D): A POS
Antibody Screen: NEGATIVE
UNIT DIVISION: 0
Unit division: 0

## 2017-09-08 LAB — MAGNESIUM: Magnesium: 2.1 mg/dL (ref 1.7–2.4)

## 2017-09-08 MED ORDER — METOPROLOL SUCCINATE ER 50 MG PO TB24
75.0000 mg | ORAL_TABLET | Freq: Every day | ORAL | Status: DC
  Administered 2017-09-08: 75 mg via ORAL
  Filled 2017-09-08: qty 1

## 2017-09-08 MED ORDER — POTASSIUM CHLORIDE CRYS ER 20 MEQ PO TBCR
40.0000 meq | EXTENDED_RELEASE_TABLET | Freq: Two times a day (BID) | ORAL | Status: AC
  Administered 2017-09-08 – 2017-09-09 (×3): 40 meq via ORAL
  Filled 2017-09-08 (×3): qty 2

## 2017-09-08 NOTE — Progress Notes (Signed)
Occupational Therapy Treatment Patient Details Name: Larry Briggs MRN: [id number] DOB: 1936-04-19 Today's Date: 09/08/2017    History of present illness 82yo male who fell while donning pants, had immediate pain and inability to bear weight. X-ray showed L intertrochanteric femur fracture. He received surgery for L IM nail placement on 09/05/17. PMH CAD, hx MI, HTN, obesity, hx CABG, coronary stent placement, cardiac cath    OT comments  Educated pt on compensatory strategies for LB ADL; pt planning to have wife assist as needed upon return home. Also educated pt on walk in shower transfer technique and use of 3 in 1 in shower as a seat; pt requiring min assist for simulated transfer. Recommend sponge bathing initially; pt agreeable. Educated on energy conservation strategies and pursed lip breathing. D/c plan remains appropriate. Will continue to follow acutely.   Follow Up Recommendations  Home health OT;Supervision/Assistance - 24 hour    Equipment Recommendations  3 in 1 bedside commode    Recommendations for Other Services      Precautions / Restrictions Precautions Precautions: Fall Restrictions Weight Bearing Restrictions: Yes LLE Weight Bearing: Weight bearing as tolerated       Mobility Bed Mobility Overal bed mobility: Needs Assistance Bed Mobility: Supine to Sit     Supine to sit: Min assist     General bed mobility comments: Pt OOB in chair upon arrival  Transfers Overall transfer level: Needs assistance Equipment used: Rolling walker (2 wheeled) Transfers: Sit to/from Stand Sit to Stand: Mod assist         General transfer comment: Pt with good hand placement and technique. Mod assist to boost up from chair x1.     Balance Overall balance assessment: Needs assistance Sitting-balance support: Feet supported;Bilateral upper extremity supported Sitting balance-Leahy Scale: Good     Standing balance support: Bilateral upper extremity  supported Standing balance-Leahy Scale: Poor Standing balance comment: RW for support                           ADL either performed or assessed with clinical judgement   ADL Overall ADL's : Needs assistance/impaired                     Lower Body Dressing: Maximal assistance;Sit to/from stand Lower Body Dressing Details (indicate cue type and reason): Pt reports wife planning to assist with LB dressing. Educated on compensatory strategies for LB ADL         Tub/ Shower Transfer: Minimal assistance;Walk-in shower;Ambulation;Rolling walker;3 in 1 Tub/Shower Transfer Details (indicate cue type and reason): Educated on use of 3 in 1 in shower as a seat. Educated on proper technique for shower transfer with use of RW. Pt able to return demo with min assist but unsteady. Discussed sponge bathing initially; pt agreeable Functional mobility during ADLs: Minimal assistance;Rolling walker General ADL Comments: Educated pt on energy conservation strategies and pursed lip breathing. SpO2 86% at one point during activity; with pursed lip breathing back up in 90s on RA.     Vision       Perception     Praxis      Cognition Arousal/Alertness: Awake/alert Behavior During Therapy: WFL for tasks assessed/performed Overall Cognitive Status: Within Functional Limits for tasks assessed  Exercises    Shoulder Instructions       General Comments      Pertinent Vitals/ Pain       Pain Assessment: Faces Faces Pain Scale: Hurts little more Pain Location: L hip with standing Pain Descriptors / Indicators: Aching;Sore Pain Intervention(s): Monitored during session;Limited activity within patient's tolerance;Repositioned  Home Living                                          Prior Functioning/Environment              Frequency  Min 3X/week        Progress Toward Goals  OT  Goals(current goals can now be found in the care plan section)  Progress towards OT goals: Progressing toward goals  Acute Rehab OT Goals Patient Stated Goal: to go home  OT Goal Formulation: With patient ADL Goals Pt Will Perform Tub/Shower Transfer: Shower transfer;with min guard assist;ambulating;3 in 1;rolling walker  Plan Discharge plan remains appropriate    Co-evaluation                 AM-PAC PT "6 Clicks" Daily Activity     Outcome Measure   Help from another person eating meals?: A Little Help from another person taking care of personal grooming?: A Little Help from another person toileting, which includes using toliet, bedpan, or urinal?: A Lot Help from another person bathing (including washing, rinsing, drying)?: A Lot Help from another person to put on and taking off regular upper body clothing?: A Little Help from another person to put on and taking off regular lower body clothing?: A Lot 6 Click Score: 15    End of Session Equipment Utilized During Treatment: Rolling walker  OT Visit Diagnosis: Other abnormalities of gait and mobility (R26.89);Muscle weakness (generalized) (M62.81);Pain Pain - Right/Left: Left Pain - part of body: Hip   Activity Tolerance Patient tolerated treatment well   Patient Left in chair;with call bell/phone within reach;with family/visitor present   Nurse Communication          Time: 1133-1150 OT Time Calculation (min): 17 min  Charges: OT General Charges $OT Visit: 1 Visit OT Treatments $Self Care/Home Management : 8-22 mins  Paxten Appelt A. Ulice Brilliant, M.S., OTR/L Pager: [place] 09/08/2017, 1:31 PM

## 2017-09-08 NOTE — Progress Notes (Signed)
Progress Note  Patient Name: Larry Briggs Date of Encounter: 09/08/2017  Primary Cardiologist: Peter Martinique, MD   Subjective   Pt feeling well. Up in chair. Has walked this morning with mild DOE that he says is normal for him due to COPD, no worse now. Denies chest discomfort, palpitations, lightheadedness.   Inpatient Medications    Scheduled Meds: . aspirin EC  325 mg Oral Q breakfast  . docusate sodium  100 mg Oral BID  . metoprolol succinate  50 mg Oral QHS  . mometasone-formoterol  2 puff Inhalation BID  . potassium chloride  40 mEq Oral BID  . senna  1 tablet Oral BID  . simvastatin  20 mg Oral QHS  . tamsulosin  0.4 mg Oral QPC supper   Continuous Infusions: . lactated ringers Stopped (09/05/17 1615)  . methocarbamol (ROBAXIN)  IV     PRN Meds: albuterol, bisacodyl, bisacodyl, HYDROcodone-acetaminophen, magnesium citrate, menthol-cetylpyridinium **OR** phenol, methocarbamol **OR** methocarbamol (ROBAXIN)  IV, morphine injection, nitroGLYCERIN, polyethylene glycol   Vital Signs    Vitals:   09/07/17 1427 09/07/17 2042 09/07/17 2048 09/08/17 0551  BP: (!) 116/57 (!) 127/47 127/60 116/75  Pulse: (!) 116 (!) 121 (!) 121 94  Resp: 18 19  20   Temp: 98.3 F (36.8 C) 98 F (36.7 C)  98.9 F (37.2 C)  TempSrc: Oral Oral  Oral  SpO2: 98% 94%  98%  Weight:    182 lb 1.6 oz (82.6 kg)  Height:        Intake/Output Summary (Last 24 hours) at 09/08/2017 1026 Last data filed at 09/08/2017 7035 Gross per 24 hour  Intake 1175 ml  Output 500 ml  Net 675 ml   Filed Weights   09/04/17 0928 09/04/17 1810 09/08/17 0551  Weight: 180 lb (81.6 kg) 182 lb 8.7 oz (82.8 kg) 182 lb 1.6 oz (82.6 kg)    Telemetry    Atrial fibrillation in the 80's-90's with PVCs - Personally Reviewed  ECG    No new tracings - Personally Reviewed  Physical Exam   GEN: No acute distress.   Neck: No JVD Cardiac: Irregualrly irregular rhythm, no murmurs, rubs, or gallops.  Respiratory:  Clear to auscultation bilaterally. GI: Soft, nontender, non-distended  MS: Surgical dressings on left thigh, edema of left thigh- pt says improving.  Neuro:  Nonfocal  Psych: Normal affect   Labs    Chemistry Recent Labs  Lab 09/04/17 1047  09/06/17 0650 09/07/17 0254 09/08/17 0608  NA 138   < > 137 133* 133*  K 3.6   < > 4.0 3.7 3.0*  CL 102   < > 100* 98* 98*  CO2 24   < > 23 23 22   GLUCOSE 129*   < > 171* 134* 179*  BUN 17   < > 21* 30* 27*  CREATININE 1.14   < > 1.13 1.30* 1.11  CALCIUM 9.0   < > 8.3* 8.1* 8.1*  PROT 6.1*  --   --   --   --   ALBUMIN 3.5  --   --   --   --   AST 25  --   --   --   --   ALT 17  --   --   --   --   ALKPHOS 80  --   --   --   --   BILITOT 1.1  --   --   --   --   GFRNONAA 59*   < >  60* 51* >60  GFRAA >60   < > >60 59* >60  ANIONGAP 12   < > 14 12 13    < > = values in this interval not displayed.     Hematology Recent Labs  Lab 09/06/17 0650 09/07/17 0254 09/08/17 0608  WBC 10.4 9.8 9.4  RBC 2.61* 2.30* 3.17*  HGB 8.2* 7.1* 9.5*  HCT 23.5* 20.5* 27.5*  MCV 90.0 89.1 86.8  MCH 31.4 30.9 30.0  MCHC 34.9 34.6 34.5  RDW 13.0 13.2 14.7  PLT 191 174 219    Cardiac Enzymes Recent Labs  Lab 09/06/17 1435 09/06/17 2105 09/07/17 0254 09/07/17 0801  TROPONINI <0.03 0.03* <0.03 <0.03   No results for input(s): TROPIPOC in the last 168 hours.   BNPNo results for input(s): BNP, PROBNP in the last 168 hours.   DDimer No results for input(s): DDIMER in the last 168 hours.   Radiology    No results found.  Cardiac Studies   Echocardiogram 09/07/17 Study Conclusions - Left ventricle: The cavity size was normal. Wall thickness was   increased in a pattern of mild LVH. Systolic function was normal.   The estimated ejection fraction was in the range of 50% to 55%.   Doppler parameters are consistent with abnormal left ventricular   relaxation (grade 1 diastolic dysfunction). - Aortic valve: Mildly calcified annulus. - Right  atrium: The atrium was mildly dilated. - Pericardium, extracardiac: A trivial pericardial effusion was   identified.  Impressions: - Technically difficult echo   Image quality is poor.  Patient Profile     82 y.o. male with history of CAD and prior CABG in 1995, protected LM stent in 2004 and known occluded SVG to distal RCA by cath in 2012, who was admitted on 09/04/17 for mechanical fall and left trochanteric fracture. Went to OR for IM nail. Post-operatively, noted to have tachycardia. EKG shows new lateral TWI's. Pt w/o angina, but noted to have progressively worsening anemia. Also noted to be in new onset atrial fibrillation 09/06/17.  Assessment & Plan   Abnormal EKG: Post op EKG showed new TWI's in anterolateral leads in the setting of tachycardia and anemia. Suspected demand ischemia. Pt denies chest pain and dyspnea. Has been transfused. Echo showed mild LVH with normal systolic function, EF 25-95%, grade 1 DD (was poor quality study). With no significant findings on echo and only borderline elevated Troponin 0.03 followed by 2 normal values and post op anemia, pt does not need inpatient ischemic evaluation such as cath. Will plan for ischemic evaluation at some point as an outpatient when he recovers from his surgery.   New onset atrial fibrillation: In setting of post-op femur repair and blood loss anemia. Pt has had long history of "skipped beats" at home, not felt by pt. Metoprolol started for rate control. Continues in afib with improved rate in the 80's-90's. Will increase Toprol tonight for better rate control. Pt was transfused yesterday with Hgb up to 9.5.  Suspect that this is related to current circumstances and may resolve with recovery from surgery.  CHA2DS2/VAS Stroke Risk Score is (HTN, CAD, Age (2)). Discussed anticoagulation with primary MD.  Anticoagulation has not been started due to post op blood loss likely occurred during surgery. Pt was transfused and is not currently  bleeding. Could intitiate DOAC, Eliquis 5 mg bid and monitor Hgb in the morning, if OK with surgeon. Or can defer A/C until follow up and initiate if continues in afib. Will discuss with  Dr. Debara Pickett.   Hypokalemia:  Potassium 3.0 today. K+ being replaced with KDur 40 mEq BID  CAD: s/p CABG ('95) with LM stenting in 2004. Denies CP. New TWIs noted on EKG and new atrial arrhthymia, however, suspect all related to demand ischemia in the setting of worsening anemia. His enzyme trend is reassuring. <0.03, 0.03, <0.03. Continue medical therapy for CAD. See recommendations above.   Anemia: Hgb 7.1, felt 2/2 acute blood loss from hip fx. Normocytic with MCV at 89. Ferritin normal. TIBC low. Iron slightly low at 41. Pt transfused yesterday with Hgb 9.5 today. Continue to follow.   Trochanteric Fx: s/p IM nail. Post op management per orthop. Plans for discharge home soon with home PT.    For questions or updates, please contact [place] Please consult [email or web address] for contact info under Cardiology/STEMI.      Signed, Daune Perch, NP  09/08/2017, 10:26 AM

## 2017-09-08 NOTE — Progress Notes (Signed)
Subjective: Patient feeling much better this morning.  Better mobilization.  Hip soreness decreasing. Tolerating diet.  Voiding.  Mild no CP, SOB.    Objective:   VITALS:   Vitals:   09/07/17 1427 09/07/17 2042 09/07/17 2048 09/08/17 0551  BP: (!) 116/57 (!) 127/47 127/60 116/75  Pulse: (!) 116 (!) 121 (!) 121 94  Resp: 18 19  20   Temp: 98.3 F (36.8 C) 98 F (36.7 C)  98.9 F (37.2 C)  TempSrc: Oral Oral  Oral  SpO2: 98% 94%  98%  Weight:    82.6 kg (182 lb 1.6 oz)  Height:       CBC Latest Ref Rng & Units 09/08/2017 09/07/2017 09/06/2017  WBC 4.0 - 10.5 K/uL 9.4 9.8 10.4  Hemoglobin 13.0 - 17.0 g/dL 9.5(L) 7.1(L) 8.2(L)  Hematocrit 39.0 - 52.0 % 27.5(L) 20.5(L) 23.5(L)  Platelets 150 - 400 K/uL 219 174 191   BMP Latest Ref Rng & Units 09/08/2017 09/07/2017 09/06/2017  Glucose 65 - 99 mg/dL 179(H) 134(H) 171(H)  BUN 6 - 20 mg/dL 27(H) 30(H) 21(H)  Creatinine 0.61 - 1.24 mg/dL 1.11 1.30(H) 1.13  Sodium 135 - 145 mmol/L 133(L) 133(L) 137  Potassium 3.5 - 5.1 mmol/L 3.0(L) 3.7 4.0  Chloride 101 - 111 mmol/L 98(L) 98(L) 100(L)  CO2 22 - 32 mmol/L 22 23 23   Calcium 8.9 - 10.3 mg/dL 8.1(L) 8.1(L) 8.3(L)   Intake/Output      02/06 0701 - 02/07 0700 02/07 0701 - 02/08 0700   P.O. 400    I.V. (mL/kg) 200 (2.4)    Blood 575    Total Intake(mL/kg) 1175 (14.2)    Urine (mL/kg/hr) 500 (0.3)    Stool 0    Total Output 500    Net +675         Urine Occurrence 2 x    Stool Occurrence 2 x       Physical Exam: General: NAD.  Upright in bed.  Calm, conversant.  Wife at bedside.  No increased wob.  MSK LLE: Neurovascularly intact Sensation intact distally Feet warm Dorsiflexion/Plantar flexion intact Thigh / lateral hip with mild swelling.  No ecchymosis.  Compressible.  Mildly tender. Incision: dressing w/ few areas of stable scant bloody drainage   Assessment: 3 Days Post-Op  S/P Procedure(s) (LRB): INTRAMEDULLARY (IM) NAIL INTERTROCHANTRIC (Left) by Dr. Ernesta Amble.  Percell Miller on 09/05/17  Active Problems:   Hypertension   Arteriosclerotic cardiovascular disease (ASCVD)   Hypercholesteremia   T wave inversion in EKG   COPD (chronic obstructive pulmonary disease) (HCC)   Hip fracture (HCC)   Acute blood loss anemia   New onset atrial fibrillation (HCC)  Closed comminuted displaced left intertrochanteric femur fracture Doing much better since transfusion and better rate control. Pain controlled, improved. Mobilizing more and seems to be on track to DC to home with home health therapy when ready medically.  Plan: Mobilize with therapy Incentive Spirometry Apply ice prn  Weight Bearing: Weight Bearing as Tolerated (WBAT)  Dressings: Maintain Mepilex.  VTE prophylaxis: Aspirin, SCDs, ambulation  Okay with possible NOAC in one week per cardiology if H&H stable and A. fib persists.  Until then, recommend 325 mg aspirin daily.  He was taking 81 mg aspirin prior to admission.  Dispo: Per primary.  Stable from an orthopedic perspective.  Home with home health PT when appropriate. Agree with cardiology recs for follow-up studies, possible NOAC, and qualifications for DC (stable H&H, electrolytes repleted, A. fib rate controlled).  Prudencio Burly III, PA-C 09/08/2017, 12:24 PM

## 2017-09-08 NOTE — Care Management Note (Signed)
Case Management Note  Patient Details  Name: Larry Briggs MRN: [id number] Date of Birth: Nov 21, 1935  Subjective/Objective:                    Action/Plan:  Patient qualifies for [facility] program  Expected Discharge Date:                  Expected Discharge Plan:  [place]  In-House Referral:     Discharge planning Services  CM Consult  Post Acute Care Choice:  Durable Medical Equipment, Home Health Choice offered to:  Spouse, Patient  DME Arranged:  3-N-1, Walker rolling DME Agency:  [facility]:  RN, PT, OT, Nurse's Aide, Social Work [place] Agency:  [place]  Status of Service:  Completed, signed off  If discussed at [employer] [facility] [employer], dates discussed:    Additional Comments:  Marilu Favre, RN 09/08/2017, 11:53 AM

## 2017-09-08 NOTE — Progress Notes (Signed)
PROGRESS NOTE    Larry Briggs  ZOX:[id number] DOB: 09/23/1935 DOA: 09/04/2017 PCP: [name], MD   Brief Narrative: 82 year old gentleman with history of coronary artery disease status post CABG in 1995, stent placed in 2004, cardiac cath in 2012, COPD, chronic lower back pain secondary to arthritis, had a fall at home while putting on his paints and sustained left hip injury.  Patient was evaluated by orthopedics and underwent  left hip surgery on 09/05/2017.  Assessment & Plan:   #Fall sustaining closed comminuted displaced left intertrochanteric femur fracture status post surgery on 09/05/2017: -Continue Robaxin.  Hydrocodone and morphine as needed. -Docusate  and MiraLAX for stool softener. -PT OT evaluation. -Weightbearing as tolerated.  Aspirin SCD and ambulation for DVT prophylaxis -Patient and his wife very hesitant to go home with home care today.  They declined rehab or SNF.  Continue PT OT therapy.  Pain is controlled.  #New onset A. fib with RVR likely in the setting of postoperative distress and anemia.  Echocardiogram reviewed.  On metoprolol.  Plan to hold systemic anticoagulation at least 1 week postoperatively until outpatient evaluation done by cardiology.  During follow-up the cardiology will decide whether to start on systemic anticoagulation and further investigation.  Discussed with the cardiology team today.  Repeat CBC and BMP in the morning.  For now continue supportive care.  Heart rate is controlled.  TSH level acceptable.  Mild elevated troponin likely demand ischemia.  #History of coronary artery disease a status post CABG in 1995, stent placement.  Continue aspirin, metoprolol, Zocor.  Patient has no chest pain or shortness of breath.  #Acute blood loss anemia: Hemoglobin dropped today further.  Received 2 units of red blood cell transfusion.  Hemoglobin is stable.  Repeat spelled in the morning.  Iron saturation acceptable.  #Hyperlipidemia: Continue  statin.  #History of hypertension:  Currently on long-acting metoprolol.  Continue to monitor.  #History of COPD: Stable now currently on room air.  Bronchodilators as needed.  #Elevation in serum creatinine level noticed likely in the setting of tachycardia and anemia.   UA with no sign of infection.  Serum creatinine level improved.  DVT prophylaxis: SCD.  On aspirin for orthopedic surgeon Code Status: Full code Family Communication: Discussed with the patient's wife and son at bedside Disposition Plan: Likely discharge with home care services in 1-2 days    Consultants:   Cardiology  Orthopedics  Procedures: Orthopedic surgery Antimicrobials: None  Subjective: Seen and examined at bedside.  Patient and his wife is still does not feel ready to go home today.  Insisting on getting rehab in the hospital today and likely go home tomorrow.  No chest pain, shortness of breath, headache or dizziness.  Pain is controlled. Objective: Vitals:   09/07/17 1427 09/07/17 2042 09/07/17 2048 09/08/17 0551  BP: (!) 116/57 (!) 127/47 127/60 116/75  Pulse: (!) 116 (!) 121 (!) 121 94  Resp: 18 19  20   Temp: 98.3 F (36.8 C) 98 F (36.7 C)  98.9 F (37.2 C)  TempSrc: Oral Oral  Oral  SpO2: 98% 94%  98%  Weight:    82.6 kg (182 lb 1.6 oz)  Height:        Intake/Output Summary (Last 24 hours) at 09/08/2017 1304 Last data filed at 09/08/2017 8119 Gross per 24 hour  Intake 875 ml  Output 500 ml  Net 375 ml   Filed Weights   09/04/17 0928 09/04/17 1810 09/08/17 0551  Weight: 81.6 kg (180  lb) 82.8 kg (182 lb 8.7 oz) 82.6 kg (182 lb 1.6 oz)    Examination:  General exam: Not in distress Respiratory system: Clear bilateral, respiratory for normal Cardiovascular system: Irregularly irregular, S1-S2 normal.  No pedal edema.. Gastrointestinal system: Abdomen soft, nontender.  Bowel sounds positive. Central nervous system: Alert awake and following commands Skin: No rashes, lesions or  ulcers Psychiatry: Judgement and insight appear normal. Mood & affect appropriate.     Data Reviewed: I have personally reviewed following labs and imaging studies  CBC: Recent Labs  Lab 09/04/17 1047 09/04/17 1813 09/05/17 0511 09/06/17 0650 09/07/17 0254 09/08/17 0608  WBC 12.5* 10.8* 8.1 10.4 9.8 9.4  NEUTROABS 11.0*  --   --   --   --   --   HGB 12.3* 11.3* 9.8* 8.2* 7.1* 9.5*  HCT 36.3* 33.1* 28.7* 23.5* 20.5* 27.5*  MCV 92.1 92.2 92.3 90.0 89.1 86.8  PLT 258 255 204 191 174 299   Basic Metabolic Panel: Recent Labs  Lab 09/04/17 1047 09/04/17 1813 09/05/17 0511 09/06/17 0650 09/07/17 0254 09/08/17 0608  NA 138  --  138 137 133* 133*  K 3.6  --  4.0 4.0 3.7 3.0*  CL 102  --  105 100* 98* 98*  CO2 24  --  23 23 23 22   GLUCOSE 129*  --  151* 171* 134* 179*  BUN 17  --  19 21* 30* 27*  CREATININE 1.14 1.04 1.12 1.13 1.30* 1.11  CALCIUM 9.0  --  8.1* 8.3* 8.1* 8.1*  MG  --   --   --   --  2.1 2.1   GFR: Estimated Creatinine Clearance: 55.7 mL/min (by C-G formula based on SCr of 1.11 mg/dL). Liver Function Tests: Recent Labs  Lab 09/04/17 1047  AST 25  ALT 17  ALKPHOS 80  BILITOT 1.1  PROT 6.1*  ALBUMIN 3.5   No results for input(s): LIPASE, AMYLASE in the last 168 hours. No results for input(s): AMMONIA in the last 168 hours. Coagulation Profile: Recent Labs  Lab 09/04/17 1047  INR 1.05   Cardiac Enzymes: Recent Labs  Lab 09/06/17 1435 09/06/17 2105 09/07/17 0254 09/07/17 0801  TROPONINI <0.03 0.03* <0.03 <0.03   BNP (last 3 results) No results for input(s): PROBNP in the last 8760 hours. HbA1C: No results for input(s): HGBA1C in the last 72 hours. CBG: No results for input(s): GLUCAP in the last 168 hours. Lipid Profile: No results for input(s): CHOL, HDL, LDLCALC, TRIG, CHOLHDL, LDLDIRECT in the last 72 hours. Thyroid Function Tests: Recent Labs    09/06/17 1435  TSH 0.800   Anemia Panel: Recent Labs    09/06/17 1430    FERRITIN 303  TIBC 211*  IRON 41*   Sepsis Labs: No results for input(s): PROCALCITON, LATICACIDVEN in the last 168 hours.  Recent Results (from the past 240 hour(s))  Urine Culture     Status: None   Collection Time: 09/05/17  4:22 AM  Result Value Ref Range Status   Specimen Description URINE, CLEAN CATCH  Final   Special Requests NONE  Final   Culture   Final    NO GROWTH Performed at [facility], [address], [place], [place] [address]    Report Status 09/06/2017 FINAL  Final  Surgical pcr screen     Status: None   Collection Time: 09/05/17  7:50 AM  Result Value Ref Range Status   MRSA, PCR NEGATIVE NEGATIVE Final   Staphylococcus aureus  NEGATIVE NEGATIVE Final    Comment: (NOTE) The Xpert SA Assay (FDA approved for NASAL specimens in patients 70 years of age and older), is one component of a comprehensive surveillance program. It is not intended to diagnose infection nor to guide or monitor treatment. Performed at [facility], [place] [address], [place], [place] [address]          Radiology Studies: No results found.      Scheduled Meds: . aspirin EC  325 mg Oral Q breakfast  . docusate sodium  100 mg Oral BID  . metoprolol succinate  75 mg Oral QHS  . mometasone-formoterol  2 puff Inhalation BID  . potassium chloride  40 mEq Oral BID  . senna  1 tablet Oral BID  . simvastatin  20 mg Oral QHS  . tamsulosin  0.4 mg Oral QPC supper   Continuous Infusions: . lactated ringers Stopped (09/05/17 1615)  . methocarbamol (ROBAXIN)  IV       LOS: 4 days    Ronan Dion Tanna Furry, MD [facility] Pager [phone]  If 7PM-7AM, please contact night-coverage [email or web address] Password [email or web address] 09/08/2017, 1:04 PM

## 2017-09-08 NOTE — Care Management Important Message (Signed)
Important Message  Patient Details  Name: Larry Briggs MRN: [id number] Date of Birth: 1936/07/22   Medicare Important Message Given:  Yes    Orbie Pyo 09/08/2017, 12:48 PM

## 2017-09-08 NOTE — Progress Notes (Signed)
Physical Therapy Treatment Patient Details Name: Larry Briggs MRN: [id number] DOB: January 23, 1936 Today's Date: 09/08/2017    History of Present Illness 82yo male who fell while donning pants, had immediate pain and inability to bear weight. X-ray showed L intertrochanteric femur fracture. He received surgery for L IM nail placement on 09/05/17. PMH CAD, hx MI, HTN, obesity, hx CABG, coronary stent placement, cardiac cath     PT Comments    Pt progressing towards physical therapy goals. Was able to perform transfers and ambulation with gross min to mod assist for balance support, safety, and management of walker at times. Pt limited mostly by DOE throughout session. Overall VSS with expected increase in HR to 120 bpm during ambulation. Will continue to follow and progress as able per POC.    Follow Up Recommendations  Home health PT([facility])     Equipment Recommendations  Rolling walker with 5" wheels;3in1 (PT)    Recommendations for Other Services       Precautions / Restrictions Precautions Precautions: Fall Restrictions Weight Bearing Restrictions: Yes LLE Weight Bearing: Weight bearing as tolerated    Mobility  Bed Mobility Overal bed mobility: Needs Assistance Bed Mobility: Supine to Sit     Supine to sit: Min assist     General bed mobility comments: Assist for LLE only.  Transfers Overall transfer level: Needs assistance Equipment used: Rolling walker (2 wheeled) Transfers: Sit to/from Stand Sit to Stand: From elevated surface;Mod assist         General transfer comment: VC's for hand placement on seated surface for safety. Pt was able to power-up to full stand with mod assist.   Ambulation/Gait Ambulation/Gait assistance: Min assist Ambulation Distance (Feet): 20 Feet Assistive device: Rolling walker (2 wheeled) Gait Pattern/deviations: Step-to pattern;Decreased stride length;Trunk flexed;Narrow base of support Gait velocity: Decreased Gait  velocity interpretation: Below normal speed for age/gender General Gait Details: VC's for sequencing and general safety with the RW. Pt reports he was limited by SOB more than pain in the leg.    Stairs            Wheelchair Mobility    Modified Rankin (Stroke Patients Only)       Balance Overall balance assessment: Needs assistance Sitting-balance support: Bilateral upper extremity supported;Feet supported Sitting balance-Leahy Scale: Good     Standing balance support: Bilateral upper extremity supported;During functional activity Standing balance-Leahy Scale: Poor Standing balance comment: requires min assist without UE support                            Cognition Arousal/Alertness: Awake/alert Behavior During Therapy: WFL for tasks assessed/performed Overall Cognitive Status: Within Functional Limits for tasks assessed                                 General Comments: Highly motivated to return to [facility]      Exercises General Exercises - Lower Extremity Ankle Circles/Pumps: 10 reps Quad Sets: 10 reps Short Arc Quad: 10 reps Long Arc Quad: 10 reps Hip ABduction/ADduction: 10 reps    General Comments        Pertinent Vitals/Pain Pain Assessment: Faces Faces Pain Scale: Hurts little more Pain Location: L hip Pain Descriptors / Indicators: Operative site guarding Pain Intervention(s): Limited activity within patient's tolerance;Monitored during session;Repositioned    Home Living  Prior Function            PT Goals (current goals can now be found in the care plan section) Acute Rehab PT Goals Patient Stated Goal: to go home  PT Goal Formulation: With patient/family Time For Goal Achievement: 09/13/17 Potential to Achieve Goals: Fair Progress towards PT goals: Progressing toward goals    Frequency    Min 5X/week      PT Plan Current plan remains appropriate    Co-evaluation               AM-PAC PT "6 Clicks" Daily Activity  Outcome Measure  Difficulty turning over in bed (including adjusting bedclothes, sheets and blankets)?: Unable Difficulty moving from lying on back to sitting on the side of the bed? : Unable Difficulty sitting down on and standing up from a chair with arms (e.g., wheelchair, bedside commode, etc,.)?: Unable Help needed moving to and from a bed to chair (including a wheelchair)?: A Little Help needed walking in hospital room?: A Little Help needed climbing 3-5 steps with a railing? : A Little 6 Click Score: 12    End of Session Equipment Utilized During Treatment: Gait belt Activity Tolerance: Patient tolerated treatment well Patient left: in chair;with call bell/phone within reach;with family/visitor present Nurse Communication: Mobility status PT Visit Diagnosis: Unsteadiness on feet (R26.81);Pain;Difficulty in walking, not elsewhere classified (R26.2) Pain - Right/Left: Left Pain - part of body: Leg     Time: 5916-3846 PT Time Calculation (min) (ACUTE ONLY): 27 min  Charges:  $Gait Training: 8-22 mins $Therapeutic Exercise: 8-22 mins                    G Codes:       Rolinda Roan, PT, DPT Acute Rehabilitation Services Pager: [phone]    Thelma Comp 09/08/2017, 10:25 AM

## 2017-09-09 DIAGNOSIS — S72002S Fracture of unspecified part of neck of left femur, sequela: Secondary | ICD-10-CM

## 2017-09-09 DIAGNOSIS — E876 Hypokalemia: Secondary | ICD-10-CM

## 2017-09-09 LAB — BASIC METABOLIC PANEL
ANION GAP: 11 (ref 5–15)
BUN: 20 mg/dL (ref 6–20)
CHLORIDE: 99 mmol/L — AB (ref 101–111)
CO2: 25 mmol/L (ref 22–32)
Calcium: 8.3 mg/dL — ABNORMAL LOW (ref 8.9–10.3)
Creatinine, Ser: 1.08 mg/dL (ref 0.61–1.24)
GFR calc Af Amer: >60 mL/min (ref >60)
Glucose, Bld: 135 mg/dL — ABNORMAL HIGH (ref 65–99)
POTASSIUM: 4.1 mmol/L (ref 3.5–5.1)
Sodium: 135 mmol/L (ref 135–145)

## 2017-09-09 LAB — CBC
HEMATOCRIT: 27.1 % — AB (ref 39.0–52.0)
HEMOGLOBIN: 9.4 g/dL — AB (ref 13.0–17.0)
MCH: 31.2 pg (ref 26.0–34.0)
MCHC: 34.7 g/dL (ref 30.0–36.0)
MCV: 90 fL (ref 78.0–100.0)
Platelets: 217 10*3/uL (ref 150–400)
RBC: 3.01 MIL/uL — AB (ref 4.22–5.81)
RDW: 15 % (ref 11.5–15.5)
WBC: 7 10*3/uL (ref 4.0–10.5)

## 2017-09-09 MED ORDER — IRON 325 (65 FE) MG PO TABS: 1.0000 | ORAL_TABLET | Freq: Two times a day (BID) | ORAL | 0 refills | Status: DC

## 2017-09-09 MED ORDER — METOPROLOL SUCCINATE ER 25 MG PO TB24: 75.0000 mg | ORAL_TABLET | Freq: Every day | ORAL | 0 refills | Status: DC

## 2017-09-09 MED ORDER — NYSTATIN 100000 UNIT/GM EX POWD: Freq: Two times a day (BID) | CUTANEOUS | 0 refills | Status: DC

## 2017-09-09 MED ORDER — DOCUSATE SODIUM 100 MG PO CAPS: 100.0000 mg | ORAL_CAPSULE | Freq: Two times a day (BID) | ORAL | 0 refills | Status: DC | PRN

## 2017-09-09 NOTE — Progress Notes (Signed)
Physical Therapy Treatment Patient Details Name: Larry Briggs MRN: [id number] DOB: [birth year] Today's Date: 09/09/2017    History of Present Illness 82yo male who fell while donning pants, had immediate pain and inability to bear weight. X-ray showed L intertrochanteric femur fracture. He received surgery for L IM nail placement on 09/05/17. PMH CAD, hx MI, HTN, obesity, hx CABG, coronary stent placement, cardiac cath     PT Comments    Pt anticipating d/c home today. Was able to complete stair training (portable curb step in room), however was significantly limited by DOE this session. Required seated rest break after 1 step to recover (~6 minutes). Discussed a modest walking program for home and slowly attempting to progress activity with planned rest breaks. Feel his impairments due to COPD will likely limit him more than due to his recent hip surgery. May be a good candidate for cardiac/pulmonary rehab when mobilizing better and encouraged pt and wife to discuss with cardiologist upon follow up.   Follow Up Recommendations  Home health PT([facility])     Equipment Recommendations  Rolling walker with 5" wheels;3in1 (PT)    Recommendations for Other Services       Precautions / Restrictions Precautions Precautions: Fall Restrictions Weight Bearing Restrictions: Yes LLE Weight Bearing: Weight bearing as tolerated    Mobility  Bed Mobility Overal bed mobility: Needs Assistance Bed Mobility: Supine to Sit     Supine to sit: Min assist     General bed mobility comments: Assist for LLE movement towards EOB. Pt transitioned to L side of bed for the first time and had more difficulty.   Transfers Overall transfer level: Needs assistance Equipment used: Rolling walker (2 wheeled) Transfers: Sit to/from Stand Sit to Stand: Mod assist         General transfer comment: From low bed height, pt was able to power up to full stand with mod assist for boost    Ambulation/Gait Ambulation/Gait assistance: Min guard Ambulation Distance (Feet): 10 Feet(5 and 5) Assistive device: Rolling walker (2 wheeled) Gait Pattern/deviations: Step-to pattern;Decreased stride length;Trunk flexed;Narrow base of support Gait velocity: Decreased Gait velocity interpretation: Below normal speed for age/gender General Gait Details: VC's for sequencing and general safety with the RW. Pt reports he was limited by SOB more than pain in the leg.    Stairs Stairs: Yes   Stair Management: No rails;Backwards;With walker;Step to pattern Number of Stairs: 1 General stair comments: Assist for balance support and safety  Wheelchair Mobility    Modified Rankin (Stroke Patients Only)       Balance Overall balance assessment: Needs assistance Sitting-balance support: Feet supported;Bilateral upper extremity supported Sitting balance-Leahy Scale: Good     Standing balance support: Bilateral upper extremity supported Standing balance-Leahy Scale: Poor Standing balance comment: RW for support                            Cognition Arousal/Alertness: Awake/alert Behavior During Therapy: WFL for tasks assessed/performed Overall Cognitive Status: Within Functional Limits for tasks assessed                                        Exercises      General Comments General comments (skin integrity, edema, etc.): Verbally reviewed HEP and recommended sets/reps for home in addition to generalized walking program modified due to COPD.  Pertinent Vitals/Pain Pain Assessment: Faces Faces Pain Scale: Hurts little more Pain Location: L hip with standing Pain Descriptors / Indicators: Aching;Sore Pain Intervention(s): Monitored during session;Repositioned    Home Living                      Prior Function            PT Goals (current goals can now be found in the care plan section) Acute Rehab PT Goals Patient Stated Goal:  to go home  PT Goal Formulation: With patient/family Time For Goal Achievement: 09/13/17 Potential to Achieve Goals: Fair Progress towards PT goals: Progressing toward goals    Frequency    Min 5X/week      PT Plan Current plan remains appropriate    Co-evaluation              AM-PAC PT "6 Clicks" Daily Activity  Outcome Measure  Difficulty turning over in bed (including adjusting bedclothes, sheets and blankets)?: Unable Difficulty moving from lying on back to sitting on the side of the bed? : Unable Difficulty sitting down on and standing up from a chair with arms (e.g., wheelchair, bedside commode, etc,.)?: Unable Help needed moving to and from a bed to chair (including a wheelchair)?: A Little Help needed walking in hospital room?: A Little Help needed climbing 3-5 steps with a railing? : A Little 6 Click Score: 12    End of Session Equipment Utilized During Treatment: Gait belt Activity Tolerance: Patient tolerated treatment well Patient left: in chair;with call bell/phone within reach;with family/visitor present Nurse Communication: Mobility status PT Visit Diagnosis: Unsteadiness on feet (R26.81);Pain;Difficulty in walking, not elsewhere classified (R26.2) Pain - Right/Left: Left Pain - part of body: Leg     Time: 1330-1406 PT Time Calculation (min) (ACUTE ONLY): 36 min  Charges:  $Gait Training: 23-37 mins                    G Codes:       Rolinda Roan, PT, DPT Acute Rehabilitation Services Pager: [place] 09/09/2017, 2:22 PM

## 2017-09-09 NOTE — Progress Notes (Signed)
Hemoglobin stable overnight- potassium repleted. Ok for d/c today from a cardiac standpoint. Will address anticoagulation at follow-up next week. We will arrange an appointment for him.  Pixie Casino, MD, [facility], [place] Director of the Advanced Lipid Disorders &  Cardiovascular Risk Reduction Clinic Diplomate of the American Board of Clinical Lipidology Attending Cardiologist  Direct Dial: [phone]  Fax: [phone]  Website:  [email or web address]

## 2017-09-09 NOTE — Discharge Summary (Signed)
Physician Discharge Summary  Larry Briggs ATF:[id number] DOB: [birth year] DOA: 09/04/2017  PCP: [name], MD  Admit date: 09/04/2017 Discharge date: 09/09/2017  Admitted From:home Disposition:home  Recommendations for Outpatient Follow-up:  1. Follow up with PCP, cardiologist, orthopeds in 1-2 weeks 2. Please obtain BMP/CBC in one week  Home Health:yes Equipment/Devices:yes Discharge Condition:stable CODE STATUS:full code Diet recommendation:heart healthy  Brief/Interim Summary: 82 year old gentleman with history of coronary artery disease status post CABG in 1995, stent placed in 2004, cardiac cath in 2012, COPD, chronic lower back pain secondary to arthritis, had a fall at home while putting on his paints and sustained left hip injury. Patient was evaluated by orthopedics and underwent left hip surgery on 09/05/2017  #Fall sustaining closed comminuted displaced left intertrochanteric femur fracture status post surgery on 09/05/2017: -Pain is controlled.  On hydrocodone, stool softener and baclofen as needed per general surgery. -As per surgeon, patient is weightbearing as tolerated. Aspirin  for DVT prophylaxis -Pain is controlled.  Evaluate by PT OT.  Patient and wife wanted to go home instead of rehab.  Home care services ordered.  Discussed with the care management team.  #New onset A. fib with RVR likely in the setting of postoperative distress and anemia.  Echocardiogram reviewed.  The dose of metoprolol increased by cardiology.  As per cardiology, plan to hold systemic anticoagulation at least 1 week postoperatively until outpatient evaluation done by cardiology.  During follow-up the cardiology will decide whether to start on systemic anticoagulation and further investigation.  TSH level acceptable.  Mild elevated troponin likely demand ischemia. -Clinically stable.  Denies chest pain, shortness of breath.  #History of coronary artery disease a status post CABG in 1995,  stent placement. Continue aspirin, metoprolol, Zocor.  Patient has no chest pain or shortness of breath.  #Acute blood loss anemia: Received 2 units of blood.  Hemoglobin is stable.  #Hyperlipidemia: Continue statin.  #History of hypertension: Currently on long-acting metoprolol. Continue to monitor.  #History of COPD: Stable now.  On room air.  Recommended bronchodilators as needed.  Patient follow-up with pulmonologist outpatient.  Recommend to continue outpatient appointments.  #Elevation in serum creatinine level noticed likely in the setting of tachycardia and anemia.   UA with no sign of infection.  Serum creatinine level improved.  Hypokalemia improved. Patient is clinically stable.  Has no chest pain, nausea, vomiting, shortness of breath.  No cough.  Wife at bedside.  I reviewed the discharge plan in detail with the patient and his wife.  Going with home care services.  I recommended outpatient close follow-up.  Stable on discharge.  Redness around external genitalia with no swelling tender.  Unknown if it is very superficial bruises versus mild fungal infection.  I ordered nystatin cream.  Discussed with the patient and family to follow-up with PCP.  Discharge Diagnoses:  Primary problem: Fall with left hip fracture.  FActive Problems:   Hypertension   Arteriosclerotic cardiovascular disease (ASCVD)   Hypercholesteremia   T wave inversion in EKG   COPD (chronic obstructive pulmonary disease) (HCC)   Hip fracture (HCC)   Acute blood loss anemia   New onset atrial fibrillation (HCC)   Hypokalemia    Discharge Instructions  Discharge Instructions    Call MD for:  difficulty breathing, headache or visual disturbances   Complete by:  As directed    Call MD for:  extreme fatigue   Complete by:  As directed    Call MD for:  hives   Complete  by:  As directed    Call MD for:  persistant dizziness or light-headedness   Complete by:  As directed    Call MD for:   persistant nausea and vomiting   Complete by:  As directed    Call MD for:  severe uncontrolled pain   Complete by:  As directed    Call MD for:  temperature >100.4   Complete by:  As directed    Diet - low sodium heart healthy   Complete by:  As directed    Discharge instructions   Complete by:  As directed    Please follow-up with your PCP, cardiologist, orthopedics and pulmonologist.   Increase activity slowly   Complete by:  As directed      Allergies as of 09/09/2017      Reactions   Iodinated Diagnostic Agents Nausea And Vomiting, Rash      Medication List    STOP taking these medications   ESTER-C 500-550 MG Tabs   irbesartan 300 MG tablet Commonly known as:  AVAPRO     TAKE these medications   AEROCHAMBER MV inhaler Use as instructed   aspirin EC 325 MG tablet Take 1 tablet (325 mg total) by mouth daily. For 30 days post op for DVT Prophylaxis What changed:    medication strength  how much to take  when to take this  additional instructions   baclofen 10 MG tablet Commonly known as:  LIORESAL Take 1 tablet (10 mg total) by mouth 3 (three) times daily as needed for muscle spasms.   budesonide-formoterol 160-4.5 MCG/ACT inhaler Commonly known as:  SYMBICORT Inhale 2 puffs into the lungs 2 (two) times daily.   chlorthalidone 25 MG tablet Commonly known as:  HYGROTON Take 1 tablet (25 mg total) by mouth daily. What changed:  when to take this   docusate sodium 100 MG capsule Commonly known as:  COLACE Take 1 capsule (100 mg total) by mouth 2 (two) times daily as needed for mild constipation.   fish oil-omega-3 fatty acids 1000 MG capsule Take 1 g by mouth daily.   Fluticasone-Salmeterol 232-14 MCG/ACT Aepb Commonly known as:  AIRDUO RESPICLICK 419/37 Inhale 1 puff into the lungs 2 (two) times daily.   HYDROcodone-acetaminophen 5-325 MG tablet Commonly known as:  NORCO Take 1-2 tablets by mouth every 6 (six) hours as needed for moderate pain.    Iron 325 (65 Fe) MG Tabs Take 1 tablet (325 mg total) by mouth 2 (two) times daily.   metoprolol succinate 25 MG 24 hr tablet Commonly known as:  TOPROL-XL Take 3 tablets (75 mg total) by mouth at bedtime. Take with or immediately following a meal. What changed:    medication strength  how much to take  when to take this  additional instructions   multivitamin-iron-minerals-folic acid chewable tablet Chew 1 tablet by mouth daily.   nitroGLYCERIN 0.4 MG SL tablet Commonly known as:  NITROSTAT Place 1 tablet (0.4 mg total) under the tongue as needed. What changed:    when to take this  reasons to take this   nystatin powder Commonly known as:  MYCOSTATIN/NYSTOP Apply topically 2 (two) times daily.   ondansetron 4 MG tablet Commonly known as:  ZOFRAN Take 1 tablet (4 mg total) by mouth every 8 (eight) hours as needed for nausea or vomiting.   simvastatin 20 MG tablet Commonly known as:  ZOCOR Take 1 tablet (20 mg total) by mouth at bedtime.   tamsulosin 0.4 MG Caps  capsule Commonly known as:  FLOMAX Take 0.4 mg by mouth daily after supper.   VENTOLIN HFA 108 (90 Base) MCG/ACT inhaler Generic drug:  albuterol Inhale 2 puffs into the lungs every 4 (four) hours as needed for wheezing or shortness of breath.            Durable Medical Equipment  (From admission, onward)        Start     Ordered   09/09/17 1113  DME 3-in-1  Once     09/09/17 1113   09/09/17 1100  For home use only DME high strength lightweight manual wheelchair with seat cushion  Once    Comments:  Patient suffers from left intertrochanteric hip fracture  which impairs their ability to perform daily activities like ambulating in the home.  A cane will not resolve  issue with performing activities of daily living. A wheelchair will allow patient to safely perform daily activities.  (THEN ONE OF THESE TWO:) left intertrochanteric hip fracture Accessories: elevating leg rests (ELRs), wheel  locks, extensions and anti-tippers.   09/09/17 1103   09/07/17 1519  For home use only DME Walker rolling  Once    Question:  Patient needs a walker to treat with the following condition  Answer:  Closed intertrochanteric fracture of hip, left, initial encounter ([place])   09/07/17 1518   09/07/17 1519  For home use only DME 3 n 1  Once     09/07/17 1519     Follow-up Information    Renette Butters, MD. Schedule an appointment as soon as possible for a visit in 2 week(s).   Specialty:  Orthopedic Surgery Contact information: [address] [place] [address] [phone]        [name], MD. Schedule an appointment as soon as possible for a visit in 1 week(s).   Specialty:  Internal Medicine Contact information: [address] [place] [address] [phone]        Martinique, Peter M, MD. Schedule an appointment as soon as possible for a visit in 1 week(s).   Specialty:  Cardiology Contact information: [address] [place] [address] [phone]          Allergies  Allergen Reactions  . Iodinated Diagnostic Agents Nausea And Vomiting and Rash    Consultations: Cardiology Orthopedics  Procedures/Studies: Hip surgery, echocardiogram  Subjective: Seen and examined at bedside.  Patient is clinically stable.  Denies pain, nausea, vomiting, chest pain, shortness of breath.  Wife at bedside.  Discharge Exam: Vitals:   09/08/17 2051 09/09/17 0541  BP: 127/66 128/63  Pulse: 90 86  Resp:  16  Temp: 98.6 F (37 C) 98.9 F (37.2 C)  SpO2: 99% 97%   Vitals:   09/08/17 0551 09/08/17 2026 09/08/17 2051 09/09/17 0541  BP: 116/75  127/66 128/63  Pulse: 94  90 86  Resp: 20   16  Temp: 98.9 F (37.2 C)  98.6 F (37 C) 98.9 F (37.2 C)  TempSrc: Oral  Oral Oral  SpO2: 98% 98% 99% 97%  Weight: 82.6 kg (182 lb 1.6 oz)     Height:        General: Pt is alert, awake, not in acute distress Cardiovascular:  RRR, S1/S2 +, no rubs, no gallops Respiratory: CTA bilaterally, no wheezing, no rhonchi Abdominal: Soft, NT, ND, bowel sounds + Extremities: no edema, no cyanosis Mild superficial erythema around external genitalia with no swelling or increased temperature.  The results of significant diagnostics from this hospitalization (including imaging, microbiology, ancillary and laboratory) are listed below for reference.     Microbiology: Recent Results (from the past 240 hour(s))  Urine Culture     Status: None   Collection Time: 09/05/17  4:22 AM  Result Value Ref Range Status   Specimen Description URINE, CLEAN CATCH  Final   Special Requests NONE  Final   Culture   Final    NO GROWTH Performed at [facility], [address] [place], [place] [address]    Report Status 09/06/2017 FINAL  Final  Surgical pcr screen     Status: None   Collection Time: 09/05/17  7:50 AM  Result Value Ref Range Status   MRSA, PCR NEGATIVE NEGATIVE Final   Staphylococcus aureus NEGATIVE NEGATIVE Final    Comment: (NOTE) The Xpert SA Assay (FDA approved for NASAL specimens in patients 19 years of age and older), is one component of a comprehensive surveillance program. It is not intended to diagnose infection nor to guide or monitor treatment. Performed at [facility], [place] [address], [place], [place] [address]      Labs: BNP (last 3 results) No results for input(s): BNP in the last 8760 hours. Basic Metabolic Panel: Recent Labs  Lab 09/05/17 0511 09/06/17 0650 09/07/17 0254 09/08/17 0608 09/09/17 0625  NA 138 137 133* 133* 135  K 4.0 4.0 3.7 3.0* 4.1  CL 105 100* 98* 98* 99*  CO2 23 23 23 22 25   GLUCOSE 151* 171* 134* 179* 135*  BUN 19 21* 30* 27* 20  CREATININE 1.12 1.13 1.30* 1.11 1.08  CALCIUM 8.1* 8.3* 8.1* 8.1* 8.3*  MG  --   --  2.1 2.1  --    Liver Function Tests: Recent Labs  Lab 09/04/17 1047  AST 25  ALT 17  ALKPHOS 80  BILITOT 1.1  PROT 6.1*  ALBUMIN  3.5   No results for input(s): LIPASE, AMYLASE in the last 168 hours. No results for input(s): AMMONIA in the last 168 hours. CBC: Recent Labs  Lab 09/04/17 1047  09/05/17 0511 09/06/17 0650 09/07/17 0254 09/08/17 0608 09/09/17 0625  WBC 12.5*   < > 8.1 10.4 9.8 9.4 7.0  NEUTROABS 11.0*  --   --   --   --   --   --   HGB 12.3*   < > 9.8* 8.2* 7.1* 9.5* 9.4*  HCT 36.3*   < > 28.7* 23.5* 20.5* 27.5* 27.1*  MCV 92.1   < > 92.3 90.0 89.1 86.8 90.0  PLT 258   < > 204 191 174 219 217   < > = values in this interval not displayed.   Cardiac Enzymes: Recent Labs  Lab 09/06/17 1435 09/06/17 2105 09/07/17 0254 09/07/17 0801  TROPONINI <0.03 0.03* <0.03 <0.03   BNP: Invalid input(s): POCBNP CBG: No results for input(s): GLUCAP in the last 168 hours. D-Dimer No results for input(s): DDIMER in the last 72 hours. Hgb A1c No results for input(s): HGBA1C in the last 72 hours. Lipid Profile No results for input(s): CHOL, HDL, LDLCALC, TRIG, CHOLHDL, LDLDIRECT in the last 72 hours. Thyroid function studies Recent Labs    09/06/17 1435  TSH 0.800   Anemia work up Recent Labs    09/06/17 1430  FERRITIN 303  TIBC 211*  IRON 41*   Urinalysis    Component Value Date/Time   COLORURINE YELLOW 09/04/2017 [place] 09/04/2017 1047  LABSPEC 1.019 09/04/2017 1047   PHURINE 5.0 09/04/2017 1047   GLUCOSEU NEGATIVE 09/04/2017 1047   HGBUR NEGATIVE 09/04/2017 1047   [place] 09/04/2017 1047   KETONESUR 20 (A) 09/04/2017 1047   PROTEINUR NEGATIVE 09/04/2017 1047   NITRITE NEGATIVE 09/04/2017 1047   LEUKOCYTESUR NEGATIVE 09/04/2017 1047   Sepsis Labs Invalid input(s): PROCALCITONIN,  WBC,  LACTICIDVEN Microbiology Recent Results (from the past 240 hour(s))  Urine Culture     Status: None   Collection Time: 09/05/17  4:22 AM  Result Value Ref Range Status   Specimen Description URINE, CLEAN CATCH  Final   Special Requests NONE  Final   Culture    Final    NO GROWTH Performed at [facility], [place] [address] [place], [place] [address]    Report Status 09/06/2017 FINAL  Final  Surgical pcr screen     Status: None   Collection Time: 09/05/17  7:50 AM  Result Value Ref Range Status   MRSA, PCR NEGATIVE NEGATIVE Final   Staphylococcus aureus NEGATIVE NEGATIVE Final    Comment: (NOTE) The Xpert SA Assay (FDA approved for NASAL specimens in patients 51 years of age and older), is one component of a comprehensive surveillance program. It is not intended to diagnose infection nor to guide or monitor treatment. Performed at [facility], [place] [address], [place], [place] [address]      Time coordinating discharge: 32 minutes  SIGNED:   Rosita Fire, MD  [facility] 09/09/2017, 11:13 AM  If 7PM-7AM, please contact night-coverage [email or web address] Password [email or web address]

## 2017-09-09 NOTE — Progress Notes (Signed)
    Subjective: Patient feeling better again this morning.  Mobilizing, but w/ some fatigue.  Hip soreness decreasing.  Objective:   VITALS:   Vitals:   09/08/17 2026 09/08/17 2051 09/09/17 0541 09/09/17 1353  BP:  127/66 128/63 111/90  Pulse:  90 86 94  Resp:   16   Temp:  98.6 F (37 C) 98.9 F (37.2 C) 97.9 F (36.6 C)  TempSrc:  Oral Oral Oral  SpO2: 98% 99% 97% 98%  Weight:      Height:       CBC Latest Ref Rng & Units 09/09/2017 09/08/2017 09/07/2017  WBC 4.0 - 10.5 K/uL 7.0 9.4 9.8  Hemoglobin 13.0 - 17.0 g/dL 9.4(L) 9.5(L) 7.1(L)  Hematocrit 39.0 - 52.0 % 27.1(L) 27.5(L) 20.5(L)  Platelets 150 - 400 K/uL 217 219 174   BMP Latest Ref Rng & Units 09/09/2017 09/08/2017 09/07/2017  Glucose 65 - 99 mg/dL 135(H) 179(H) 134(H)  BUN 6 - 20 mg/dL 20 27(H) 30(H)  Creatinine 0.61 - 1.24 mg/dL 1.08 1.11 1.30(H)  Sodium 135 - 145 mmol/L 135 133(L) 133(L)  Potassium 3.5 - 5.1 mmol/L 4.1 3.0(L) 3.7  Chloride 101 - 111 mmol/L 99(L) 98(L) 98(L)  CO2 22 - 32 mmol/L 25 22 23   Calcium 8.9 - 10.3 mg/dL 8.3(L) 8.1(L) 8.1(L)   Intake/Output      02/07 0701 - 02/08 0700 02/08 0701 - 02/09 0700   P.O. 840 420   I.V. (mL/kg)     Blood     Total Intake(mL/kg) 840 (10.2) 420 (5.1)   Urine (mL/kg/hr) 500 (0.3)    Stool     Total Output 500    Net +340 +420        Urine Occurrence 2 x 1 x   Stool Occurrence  1 x      Physical Exam: General: NAD.  Upright in bed.  Calm, conversant.  Wife at bedside.  No increased wob.  MSK LLE: Neurovascularly intact Sensation intact distally Feet warm Dorsiflexion/Plantar flexion intact Thigh / lateral hip with mild swelling.  No ecchymosis.  Compressible.  Mildly tender. Incision: dressing w/ few areas of stable scant bloody drainage   Assessment: 4 Days Post-Op  S/P Procedure(s) (LRB): INTRAMEDULLARY (IM) NAIL INTERTROCHANTRIC (Left) by Dr. Ernesta Amble. [facility] Miller on 09/05/17  Active Problems:   Hypertension   Arteriosclerotic cardiovascular  disease (ASCVD)   Hypercholesteremia   T wave inversion in EKG   COPD (chronic obstructive pulmonary disease) (HCC)   Hip fracture (HCC)   Acute blood loss anemia   New onset atrial fibrillation (HCC)   Hypokalemia  Closed comminuted displaced left intertrochanteric femur fracture Stable for d/c from an orthopedic perspective. Pain controlled, mobilizing.  Plan: Mobilize with therapy Incentive Spirometry Apply ice prn  Weight Bearing: Weight Bearing as Tolerated (WBAT)  Dressings: Maintain Mepilex.  VTE prophylaxis: Aspirin, SCDs, ambulation  Okay with possible NOAC in one week per cardiology if H&H stable and A. fib persists.  Until then, recommend 325 mg aspirin daily.  He was taking 81 mg aspirin prior to admission.  Dispo: Per primary.  Stable from an orthopedic perspective.  Home with home health PT when appropriate.  Follow up in the office with Dr. Alain Marion in 1-2 weeks.  Please call with questions.  Prudencio Burly III, PA-C 09/09/2017, 4:30 PM

## 2017-09-09 NOTE — Progress Notes (Signed)
Patient discharged to home with prescriptions, instructions and equipments.

## 2017-09-09 NOTE — Care Management (Signed)
    Durable Medical Equipment  (From admission, onward)        Start     Ordered   09/09/17 1100  For home use only DME high strength lightweight manual wheelchair with seat cushion  Once    Comments:  Patient suffers from left intertrochanteric hip fracture  which impairs their ability to perform daily activities like ambulating in the home.  A cane will not resolve  issue with performing activities of daily living. A wheelchair will allow patient to safely perform daily activities.  (THEN ONE OF THESE TWO:) left intertrochanteric hip fracture Accessories: elevating leg rests (ELRs), wheel locks, extensions and anti-tippers.   09/09/17 1103   09/07/17 1519  For home use only DME Walker rolling  Once    Question:  Patient needs a walker to treat with the following condition  Answer:  Closed intertrochanteric fracture of hip, left, initial encounter ([place])   09/07/17 1518   09/07/17 1519  For home use only DME 3 n 1  Once     09/07/17 1519

## 2017-09-10 DIAGNOSIS — I4891 Unspecified atrial fibrillation: Secondary | ICD-10-CM | POA: Diagnosis not present

## 2017-09-10 DIAGNOSIS — S72142D Displaced intertrochanteric fracture of left femur, subsequent encounter for closed fracture with routine healing: Secondary | ICD-10-CM | POA: Diagnosis not present

## 2017-09-11 DIAGNOSIS — I4891 Unspecified atrial fibrillation: Secondary | ICD-10-CM | POA: Diagnosis not present

## 2017-09-11 DIAGNOSIS — S72142D Displaced intertrochanteric fracture of left femur, subsequent encounter for closed fracture with routine healing: Secondary | ICD-10-CM | POA: Diagnosis not present

## 2017-09-12 ENCOUNTER — Other Ambulatory Visit: Payer: Self-pay | Admitting: *Deleted

## 2017-09-12 DIAGNOSIS — I4891 Unspecified atrial fibrillation: Secondary | ICD-10-CM | POA: Diagnosis not present

## 2017-09-12 DIAGNOSIS — S72142D Displaced intertrochanteric fracture of left femur, subsequent encounter for closed fracture with routine healing: Secondary | ICD-10-CM | POA: Diagnosis not present

## 2017-09-12 NOTE — Patient Outreach (Signed)
Larry Briggs with [facility] made this writer aware of patient's enrollment with [facility] program.  Telephone call to speak with Larry Briggs. Spoke with patient's wife to make aware that if transportation or pharmacy needs are identified while on the [place] program, [facility] Care Management could potentially assist. Larry Briggs states she does not think they will have any transportation or pharmacy needs. Discussed that this service is available if needed while on the [place] program.   Notification sent to [place] Management team of patient's enrollment with [place], MSN-Ed, RN,BSN [facility] Liaison [phone]

## 2017-09-14 DIAGNOSIS — I4891 Unspecified atrial fibrillation: Secondary | ICD-10-CM | POA: Diagnosis not present

## 2017-09-14 DIAGNOSIS — S72142D Displaced intertrochanteric fracture of left femur, subsequent encounter for closed fracture with routine healing: Secondary | ICD-10-CM | POA: Diagnosis not present

## 2017-09-15 ENCOUNTER — Telehealth: Payer: Self-pay | Admitting: Pulmonary Disease

## 2017-09-15 MED ORDER — FLUTICASONE-SALMETEROL 232-14 MCG/ACT IN AEPB: 1.0000 | INHALATION_SPRAY | Freq: Two times a day (BID) | RESPIRATORY_TRACT | 0 refills | Status: DC

## 2017-09-15 NOTE — Telephone Encounter (Signed)
Rx sent to [facility].  ATC pt, no answer. Left message for pt to call back.

## 2017-09-15 NOTE — Telephone Encounter (Signed)
Patient's wife returned call and was advised RX has been sent in.  No call back is needed.

## 2017-09-16 ENCOUNTER — Ambulatory Visit (INDEPENDENT_AMBULATORY_CARE_PROVIDER_SITE_OTHER): Payer: Medicare Other | Admitting: Physician Assistant

## 2017-09-16 ENCOUNTER — Encounter: Payer: Self-pay | Admitting: Physician Assistant

## 2017-09-16 VITALS — BP 111/66 | HR 78 | Ht 70.0 in

## 2017-09-16 DIAGNOSIS — I480 Paroxysmal atrial fibrillation: Secondary | ICD-10-CM

## 2017-09-16 DIAGNOSIS — J449 Chronic obstructive pulmonary disease, unspecified: Secondary | ICD-10-CM

## 2017-09-16 DIAGNOSIS — D649 Anemia, unspecified: Secondary | ICD-10-CM

## 2017-09-16 DIAGNOSIS — I25810 Atherosclerosis of coronary artery bypass graft(s) without angina pectoris: Secondary | ICD-10-CM

## 2017-09-16 DIAGNOSIS — I10 Essential (primary) hypertension: Secondary | ICD-10-CM

## 2017-09-16 DIAGNOSIS — E785 Hyperlipidemia, unspecified: Secondary | ICD-10-CM

## 2017-09-16 MED ORDER — CHLORTHALIDONE 25 MG PO TABS: 25.0000 mg | ORAL_TABLET | Freq: Every day | ORAL | 1 refills | Status: DC

## 2017-09-16 NOTE — Patient Instructions (Signed)
Medication Instructions: Your physician recommends that you continue on your current medications as directed. Please refer to the Current Medication list given to you today.  Labwork: Your physician recommends that you return for lab work today--CBC.  Testing/Procedures: Your physician has recommended that you wear a 30 day event monitor. Event monitors are medical devices that record the heart's electrical activity. Doctors most often [place] these monitors to diagnose arrhythmias. Arrhythmias are problems with the speed or rhythm of the heartbeat. The monitor is a small, portable device. You can wear one while you do your normal daily activities. This is usually used to diagnose what is causing palpitations/syncope (passing out).  Follow-Up: Your physician recommends that you schedule a follow-up appointment in: 8-9 weeks with Dr. Martinique.   Any Other Instructions are listed below:  Continue to monitor your leg swelling. If not improved over the next 2 weeks, please call our office.

## 2017-09-16 NOTE — Progress Notes (Signed)
Cardiology Office Note    Date:  09/17/2017   ID:  Larry Briggs, DOB 11-Dec-1935, MRN [id number]  PCP:  [name], MD  Cardiologist:  Dr. Martinique   Chief Complaint  Patient presents with  . Hospitalization Follow-up    seen for Dr. Martinique    History of Present Illness:  Larry Briggs is a 82 y.o. male with PMH of HTN, HLD, severe COPD and CAD s/p CABG 1995.  He underwent stenting of his left main in 2004.  Last cardiac catheterization in April 2012 showed patent stents and grafts except for SVG to distal RCA.  PDA was supplied by left-to-right collaterals.  He had a side effect on blood pressure medication which improved after discontinuation of lisinopril and doxazosin and the initiation of valsartan.  His valsartan was later switched to irbesartan.  He was last seen by Dr. Martinique on 06/17/2017, he has baseline chronic dyspnea.  He is also quite sedentary as well, his activity is especially limited by significant back pain.  Otherwise he denies any chest pain at the time.  Patient was most recently admitted in February with left hip fracture after a mechanical fall.  He underwent intramedullary nail in the left intertrochanteric hip.  EKG showed with new T wave inversion in anterolateral leads in the setting of tachycardia and anemia.  Cardiology was consulted for abnormal EKG.  Patient however denied any anginal symptom.  Echocardiogram obtained on 09/07/2017 showed EF 50-55%, grade 1 DD, trivial pericardial effusion.  Overall poor image.  Serial troponin were negative.  Suspicion for angina was low.  Overnight, he went into atrial fibrillation with RVR which is new to him.  He was not a anticoagulation candidate given significant bleeding risk after surgery.  The plan is to address anticoagulation therapy on follow-up.  Patient presents today for cardiology office visit.  He has since converted to sinus rhythm.  Review of the previous hospital EKG also showed sinus tachycardia with  PACs but no atrial fibrillation.  Telemetry strips from the hospital however reveals possible irregular heart rhythm, however difficult to see the P wave, he was recently seen in the hospital by Dr. Debara Pickett who felt that he was in atrial fibrillation at the time.  This is new to him.  Given the fact that there was only one episode of atrial fibrillation during the recent admission and that that is due to the stress from lack fracture and anemia, I recommended to hold off on starting systemic anticoagulation at this time.  He is doing physical therapy on a daily basis.  He is currently very well controlled on Toprol-XL 75 mg daily.  He is on 325 mg daily of aspirin for the first 30 days before transition back to 81 mg.  This is for DVT prophylaxis given the fact that he cannot take stronger systemic anticoagulation.  He does have persistent left lower extremity swelling however denies any calf pain.  He does have knee pain, however this is only exacerbated if he bends the knee in certain degree.  I think the lower ex extremity edema would be expected after the recent surgery.  However if it does not go down in the next 2 weeks, I recommended the family to give me a call and I can order a venous Doppler.  Yes EKG   Past Medical History:  Diagnosis Date  . CAD (coronary artery disease)    Remote MI in 1980. CABG 1995. Stent to left main in 2004.  Last cath in April of 2012. EF 50%; patent LIMA to DX/LAD with collateralization of the distal right, patent SVG to OM, occluded SVG to distal RCA, and patent stent to LAD  . History of heart attack 1980  . Hypercholesteremia   . Hypertension   . Increased glucose level   . Obesity     Past Surgical History:  Procedure Laterality Date  . CARDIAC CATHETERIZATION  1993  . CARDIAC CATHETERIZATION  April 2012   Mild reduction if EF at 50%. Patent LIMA to DX/LAD with collateralization to distal RCA, patent SVG to OM and occluded SVG to distal right and patent stent  to LAD/Left main;  . CORONARY ARTERY BYPASS GRAFT  05/1994   x5 LIMA to LAD & DX, SVG to LCX, SVG to RCA  . CORONARY STENT PLACEMENT  2004   Stent to the L main  . INTRAMEDULLARY (IM) NAIL INTERTROCHANTERIC Left 09/05/2017   Procedure: INTRAMEDULLARY (IM) NAIL INTERTROCHANTRIC;  Surgeon: Renette Butters, MD;  Location: [facility];  Service: Orthopedics;  Laterality: Left;    Current Medications: Outpatient Medications Prior to Visit  Medication Sig Dispense Refill  . aspirin EC 325 MG tablet Take 1 tablet (325 mg total) by mouth daily. For 30 days post op for DVT Prophylaxis 30 tablet 0  . baclofen (LIORESAL) 10 MG tablet Take 1 tablet (10 mg total) by mouth 3 (three) times daily as needed for muscle spasms. 40 each 0  . Bioflavonoid Products (ESTER C PO) Take by mouth daily.    . Ferrous Sulfate (IRON) 325 (65 Fe) MG TABS Take 1 tablet (325 mg total) by mouth 2 (two) times daily. 60 each 0  . fish oil-omega-3 fatty acids 1000 MG capsule Take 1 g by mouth daily.     . Fluticasone-Salmeterol (AIRDUO RESPICLICK 448/18) 563-14 MCG/ACT AEPB Inhale 1 puff into the lungs 2 (two) times daily. 1 each 0  . HYDROcodone-acetaminophen (NORCO) 5-325 MG tablet Take 1-2 tablets by mouth every 6 (six) hours as needed for moderate pain. 40 tablet 0  . metoprolol succinate (TOPROL-XL) 25 MG 24 hr tablet Take 3 tablets (75 mg total) by mouth at bedtime. Take with or immediately following a meal. 30 tablet 0  . multivitamin-iron-minerals-folic acid (CENTRUM) chewable tablet Chew 1 tablet by mouth daily.    . nitroGLYCERIN (NITROSTAT) 0.4 MG SL tablet Place 1 tablet (0.4 mg total) under the tongue as needed. (Patient taking differently: Place 0.4 mg under the tongue every 5 (five) minutes as needed for chest pain. ) 25 tablet 3  . nystatin (MYCOSTATIN/NYSTOP) powder Apply topically 2 (two) times daily. 15 g 0  . simvastatin (ZOCOR) 20 MG tablet Take 1 tablet (20 mg total) by mouth at bedtime. 90 tablet 2  .  Spacer/Aero-Holding Chambers (AEROCHAMBER MV) inhaler Use as instructed 1 each 0  . tamsulosin (FLOMAX) 0.4 MG CAPS capsule Take 0.4 mg by mouth daily after supper.    . VENTOLIN HFA 108 (90 Base) MCG/ACT inhaler Inhale 2 puffs into the lungs every 4 (four) hours as needed for wheezing or shortness of breath. 1 Inhaler 3  . budesonide-formoterol (SYMBICORT) 160-4.5 MCG/ACT inhaler Inhale 2 puffs into the lungs 2 (two) times daily.    . chlorthalidone (HYGROTON) 25 MG tablet Take 1 tablet (25 mg total) by mouth daily. (Patient taking differently: Take 25 mg by mouth at bedtime. ) 90 tablet 3  . docusate sodium (COLACE) 100 MG capsule Take 1 capsule (100 mg total) by mouth 2 (  two) times daily as needed for mild constipation. 20 capsule 0  . ondansetron (ZOFRAN) 4 MG tablet Take 1 tablet (4 mg total) by mouth every 8 (eight) hours as needed for nausea or vomiting. 40 tablet 0   No facility-administered medications prior to visit.      Allergies:   Iodinated diagnostic agents   Social History   Socioeconomic History  . Marital status: Married    Spouse name: None  . Number of children: None  . Years of education: None  . Highest education level: None  Social Needs  . Financial resource strain: None  . Food insecurity - worry: None  . Food insecurity - inability: None  . Transportation needs - medical: None  . Transportation needs - non-medical: None  Occupational History  . None  Tobacco Use  . Smoking status: Former Smoker    Packs/day: 1.00    Years: 40.00    Pack years: 40.00    Types: Cigarettes    Last attempt to quit: 07/02/1994    Years since quitting: 23.2  . Smokeless tobacco: Never Used  Substance and Sexual Activity  . Alcohol use: No  . Drug use: No  . Sexual activity: Yes  Other Topics Concern  . None  Social History Narrative  . None     Family History:  The patient's family history includes Heart attack in his father; Hypertension in his father; Stroke in his  mother.   ROS:   Please see the history of present illness.    ROS All other systems reviewed and are negative.   PHYSICAL EXAM:   VS:  BP 111/66   Pulse 78   Ht 5\' 10"  (1.778 m)   BMI 26.13 kg/m    GEN: Well nourished, well developed, in no acute distress  HEENT: normal  Neck: no JVD, carotid bruits, or masses Cardiac: RRR; no murmurs, rubs, or gallops,no edema  Respiratory:  clear to auscultation bilaterally, normal work of breathing GI: soft, nontender, nondistended, + BS MS: no deformity or atrophy  Skin: warm and dry, no rash Neuro:  Alert and Oriented x 3, Strength and sensation are intact Psych: euthymic mood, full affect  Wt Readings from Last 3 Encounters:  09/08/17 182 lb 1.6 oz (82.6 kg)  06/17/17 182 lb 3.2 oz (82.6 kg)  02/21/17 187 lb 3.2 oz (84.9 kg)      Studies/Labs Reviewed:   EKG:  EKG is ordered today.  The ekg ordered today demonstrates normal sinus rhythm  Recent Labs: 09/04/2017: ALT 17 09/06/2017: TSH 0.800 09/08/2017: Magnesium 2.1 09/09/2017: BUN 20; Creatinine, Ser 1.08; Potassium 4.1; Sodium 135 09/16/2017: Hemoglobin 11.5; Platelets 358   Lipid Panel    Component Value Date/Time   CHOL  11/14/2010 0500    112        ATP III CLASSIFICATION:  <200     mg/dL   Desirable  200-239  mg/dL   Borderline High  >=240    mg/dL   High          TRIG 108 11/14/2010 0500   HDL 40 11/14/2010 0500   CHOLHDL 2.8 11/14/2010 0500   VLDL 22 11/14/2010 0500   LDLCALC  11/14/2010 0500    50        Total Cholesterol/HDL:CHD Risk Coronary Heart Disease Risk Table                     Men   Women  1/2 Average Risk  3.4   3.3  Average Risk       5.0   4.4  2 X Average Risk   9.6   7.1  3 X Average Risk  23.4   11.0        Use the calculated Patient Ratio above and the CHD Risk Table to determine the patient's CHD Risk.        ATP III CLASSIFICATION (LDL):  <100     mg/dL   Optimal  100-129  mg/dL   Near or Above                    Optimal  130-159   mg/dL   Borderline  160-189  mg/dL   High  >190     mg/dL   Very High    Additional studies/ records that were reviewed today include:   Echo 09/2017 LV EF: 50% -   55%  ------------------------------------------------------------------- Study Conclusions  - Left ventricle: The cavity size was normal. Wall thickness was   increased in a pattern of mild LVH. Systolic function was normal.   The estimated ejection fraction was in the range of 50% to 55%.   Doppler parameters are consistent with abnormal left ventricular   relaxation (grade 1 diastolic dysfunction). - Aortic valve: Mildly calcified annulus. - Right atrium: The atrium was mildly dilated. - Pericardium, extracardiac: A trivial pericardial effusion was   identified.  Impressions:  - Technically difficult echo   Image quality is poor.    ASSESSMENT:    1. PAF (paroxysmal atrial fibrillation) ([place])   2. Essential hypertension   3. Anemia, unspecified type   4. Coronary artery disease involving coronary bypass graft of native heart without angina pectoris   5. Chronic obstructive pulmonary disease, unspecified COPD type ([place])   6. Hyperlipidemia, unspecified hyperlipidemia type      PLAN:  In order of problems listed above:  1. PAF: Currently maintaining sinus rhythm.  Paroxysmal atrial fibrillation occurred in the setting of hip fracture after surgery and also significant anemia.  May be related to the stress of admission.  Most recent lab work continue to show significant anemia.  I will repeat a CBC today.  I will obtain a 30-day event monitor to further assess, if he does have recurrence of atrial fibrillation on the event monitor, he will need long-term systemic anticoagulation.  However if he does not have any recurrence, may be best to hold off on blood thinner.  2. CAD s/p CABG: No recent angina  3. Hypertension: Blood pressure well controlled  4. Hyperlipidemia: On Zocor 20 mg daily.  Lipid panel  monitored by primary care provider  5. Anemia: Related to recent surgery, obtain CBC today.  6. COPD: Has chronic dyspnea at baseline.    Medication Adjustments/Labs and Tests Ordered: Current medicines are reviewed at length with the patient today.  Concerns regarding medicines are outlined above.  Medication changes, Labs and Tests ordered today are listed in the Patient Instructions below. Patient Instructions  Medication Instructions: Your physician recommends that you continue on your current medications as directed. Please refer to the Current Medication list given to you today.  Labwork: Your physician recommends that you return for lab work today--CBC.  Testing/Procedures: Your physician has recommended that you wear a 30 day event monitor. Event monitors are medical devices that record the heart's electrical activity. Doctors most often [place] these monitors to diagnose arrhythmias. Arrhythmias are problems with the speed or rhythm of the  heartbeat. The monitor is a small, portable device. You can wear one while you do your normal daily activities. This is usually used to diagnose what is causing palpitations/syncope (passing out).  Follow-Up: Your physician recommends that you schedule a follow-up appointment in: 8-9 weeks with Dr. Martinique.   Any Other Instructions are listed below:  Continue to monitor your leg swelling. If not improved over the next 2 weeks, please call our office.     Hilbert Corrigan, [place]  09/17/2017 6:49 PM    [facility] [place], [place], [place]  [address] Phone: [phone]; Fax: [phone]

## 2017-09-17 ENCOUNTER — Encounter: Payer: Self-pay | Admitting: Physician Assistant

## 2017-09-17 DIAGNOSIS — I4891 Unspecified atrial fibrillation: Secondary | ICD-10-CM | POA: Diagnosis not present

## 2017-09-17 DIAGNOSIS — S72142D Displaced intertrochanteric fracture of left femur, subsequent encounter for closed fracture with routine healing: Secondary | ICD-10-CM | POA: Diagnosis not present

## 2017-09-17 LAB — CBC WITH DIFFERENTIAL/PLATELET
BASOS ABS: 0 10*3/uL (ref 0.0–0.2)
Basos: 1 %
EOS (ABSOLUTE): 0.1 10*3/uL (ref 0.0–0.4)
Eos: 1 %
Hematocrit: 34.3 % — ABNORMAL LOW (ref 37.5–51.0)
Hemoglobin: 11.5 g/dL — ABNORMAL LOW (ref 13.0–17.7)
IMMATURE GRANULOCYTES: 0 %
Immature Grans (Abs): 0 10*3/uL (ref 0.0–0.1)
LYMPHS: 8 %
Lymphocytes Absolute: 0.7 10*3/uL (ref 0.7–3.1)
MCH: 30.8 pg (ref 26.6–33.0)
MCHC: 33.5 g/dL (ref 31.5–35.7)
MCV: 92 fL (ref 79–97)
Monocytes Absolute: 0.5 10*3/uL (ref 0.1–0.9)
Monocytes: 5 %
NEUTROS PCT: 85 %
Neutrophils Absolute: 7.3 10*3/uL — ABNORMAL HIGH (ref 1.4–7.0)
PLATELETS: 358 10*3/uL (ref 150–379)
RBC: 3.73 x10E6/uL — AB (ref 4.14–5.80)
RDW: 16.2 % — ABNORMAL HIGH (ref 12.3–15.4)
WBC: 8.5 10*3/uL (ref 3.4–10.8)

## 2017-09-19 DIAGNOSIS — S72142D Displaced intertrochanteric fracture of left femur, subsequent encounter for closed fracture with routine healing: Secondary | ICD-10-CM | POA: Diagnosis not present

## 2017-09-19 DIAGNOSIS — I4891 Unspecified atrial fibrillation: Secondary | ICD-10-CM | POA: Diagnosis not present

## 2017-09-20 DIAGNOSIS — S72142D Displaced intertrochanteric fracture of left femur, subsequent encounter for closed fracture with routine healing: Secondary | ICD-10-CM | POA: Diagnosis not present

## 2017-09-20 DIAGNOSIS — I4891 Unspecified atrial fibrillation: Secondary | ICD-10-CM | POA: Diagnosis not present

## 2017-09-20 NOTE — Progress Notes (Signed)
Red blood cell count has improved, now close to normal level

## 2017-09-21 DIAGNOSIS — S72142D Displaced intertrochanteric fracture of left femur, subsequent encounter for closed fracture with routine healing: Secondary | ICD-10-CM | POA: Diagnosis not present

## 2017-09-22 DIAGNOSIS — S72142D Displaced intertrochanteric fracture of left femur, subsequent encounter for closed fracture with routine healing: Secondary | ICD-10-CM | POA: Diagnosis not present

## 2017-09-22 DIAGNOSIS — I4891 Unspecified atrial fibrillation: Secondary | ICD-10-CM | POA: Diagnosis not present

## 2017-09-23 DIAGNOSIS — I4891 Unspecified atrial fibrillation: Secondary | ICD-10-CM | POA: Diagnosis not present

## 2017-09-23 DIAGNOSIS — S72142D Displaced intertrochanteric fracture of left femur, subsequent encounter for closed fracture with routine healing: Secondary | ICD-10-CM | POA: Diagnosis not present

## 2017-09-26 ENCOUNTER — Telehealth: Payer: Self-pay | Admitting: Cardiology

## 2017-09-26 ENCOUNTER — Ambulatory Visit (INDEPENDENT_AMBULATORY_CARE_PROVIDER_SITE_OTHER): Payer: Medicare Other

## 2017-09-26 DIAGNOSIS — I480 Paroxysmal atrial fibrillation: Secondary | ICD-10-CM

## 2017-09-26 NOTE — Telephone Encounter (Signed)
Incoming call from [employer] that the patient had afib with sustained PVCs (7 in 1 minute).   Call placed to the patient. Message left for the patient to call back.

## 2017-09-26 NOTE — Telephone Encounter (Signed)
New message     Critical holter monitor result

## 2017-09-27 ENCOUNTER — Telehealth: Payer: Self-pay | Admitting: Physician Assistant

## 2017-09-27 DIAGNOSIS — S72142D Displaced intertrochanteric fracture of left femur, subsequent encounter for closed fracture with routine healing: Secondary | ICD-10-CM | POA: Diagnosis not present

## 2017-09-27 DIAGNOSIS — I4891 Unspecified atrial fibrillation: Secondary | ICD-10-CM | POA: Diagnosis not present

## 2017-09-27 NOTE — Telephone Encounter (Addendum)
Returned call to patient concerning event monitor findings from 09/27/27 @ 11:53am CT that demonstrated AF w/PVCs. This is day 1 of 30 for event monitor. He had no symptoms around this time. Advised he continue wearing monitor.   Monitor strip was reviewed by DOD on Monday Feb 25 and no changes were recommended   He reports SOB when he walks long distances but this is due to COPD

## 2017-09-27 NOTE — Telephone Encounter (Signed)
New message   Pt wife is calling with more questions regarding the phone they had with the nurse. Please call

## 2017-09-27 NOTE — Telephone Encounter (Signed)
Thank you. Please request the strip to be reviewed by either Dr. Martinique or me, if truly afib, then he will need blood thinner

## 2017-09-27 NOTE — Telephone Encounter (Signed)
Returned call to wife. Explained that monitor findings reviewed by MD and no med changes were recommended at this time. She states that her husband gets SOB and wants to know if this is from his PAF or COPD. Explained I cannot determine that but suggested that patient use symptom recording when he has SOB (or other symptoms) to log when he experiences this so that MD can review to see if he had AF that that time or not. She states husband does not want to go on any more medications if he does not have to. Explained that he should continue wearing monitor for next 30 days and his meds/treatment plan may or may not be adjusted based on the findings.   PA note: PAF: Currently maintaining sinus rhythm.  Paroxysmal atrial fibrillation occurred in the setting of hip fracture after surgery and also significant anemia.  May be related to the stress of admission.  Most recent lab work continue to show significant anemia.  I will repeat a CBC today.  I will obtain a 30-day event monitor to further assess, if he does have recurrence of atrial fibrillation on the event monitor, he will need long-term systemic anticoagulation.  However if he does not have any recurrence, may be best to hold off on blood thinner.

## 2017-09-27 NOTE — Telephone Encounter (Signed)
New message ° °Pt verbalized that he is returning call for RN °

## 2017-09-28 DIAGNOSIS — S72142D Displaced intertrochanteric fracture of left femur, subsequent encounter for closed fracture with routine healing: Secondary | ICD-10-CM | POA: Diagnosis not present

## 2017-09-28 DIAGNOSIS — I4891 Unspecified atrial fibrillation: Secondary | ICD-10-CM | POA: Diagnosis not present

## 2017-09-28 NOTE — Telephone Encounter (Signed)
Monitor strips have been reviewed by Dr. Martinique who does not feel that recording is AF. Recommended no changes at present time, continue wearing monitor.

## 2017-09-29 DIAGNOSIS — I4891 Unspecified atrial fibrillation: Secondary | ICD-10-CM | POA: Diagnosis not present

## 2017-09-29 DIAGNOSIS — S72142D Displaced intertrochanteric fracture of left femur, subsequent encounter for closed fracture with routine healing: Secondary | ICD-10-CM | POA: Diagnosis not present

## 2017-09-30 DIAGNOSIS — S72142D Displaced intertrochanteric fracture of left femur, subsequent encounter for closed fracture with routine healing: Secondary | ICD-10-CM | POA: Diagnosis not present

## 2017-09-30 DIAGNOSIS — I4891 Unspecified atrial fibrillation: Secondary | ICD-10-CM | POA: Diagnosis not present

## 2017-10-04 ENCOUNTER — Telehealth: Payer: Self-pay | Admitting: Pulmonary Disease

## 2017-10-04 DIAGNOSIS — S72142D Displaced intertrochanteric fracture of left femur, subsequent encounter for closed fracture with routine healing: Secondary | ICD-10-CM | POA: Diagnosis not present

## 2017-10-04 DIAGNOSIS — I4891 Unspecified atrial fibrillation: Secondary | ICD-10-CM | POA: Diagnosis not present

## 2017-10-04 MED ORDER — FLUTICASONE-SALMETEROL 232-14 MCG/ACT IN AEPB: 1.0000 | INHALATION_SPRAY | Freq: Two times a day (BID) | RESPIRATORY_TRACT | 0 refills | Status: DC

## 2017-10-04 NOTE — Telephone Encounter (Signed)
Called and spoke with patients wife, she states that patient is coming off of the Symbicort and switching to generic for [employer]. They are needing it sent to [facility] on [address] due to [facility] not having any in stock . Will send to correct pharmacy.

## 2017-10-05 DIAGNOSIS — I4891 Unspecified atrial fibrillation: Secondary | ICD-10-CM | POA: Diagnosis not present

## 2017-10-05 DIAGNOSIS — S72142D Displaced intertrochanteric fracture of left femur, subsequent encounter for closed fracture with routine healing: Secondary | ICD-10-CM | POA: Diagnosis not present

## 2017-10-05 DIAGNOSIS — J449 Chronic obstructive pulmonary disease, unspecified: Secondary | ICD-10-CM | POA: Diagnosis not present

## 2017-10-05 DIAGNOSIS — R05 Cough: Secondary | ICD-10-CM | POA: Diagnosis not present

## 2017-10-06 DIAGNOSIS — I4891 Unspecified atrial fibrillation: Secondary | ICD-10-CM | POA: Diagnosis not present

## 2017-10-06 DIAGNOSIS — S72142D Displaced intertrochanteric fracture of left femur, subsequent encounter for closed fracture with routine healing: Secondary | ICD-10-CM | POA: Diagnosis not present

## 2017-10-10 ENCOUNTER — Telehealth: Payer: Self-pay | Admitting: Pulmonary Disease

## 2017-10-10 DIAGNOSIS — S72142D Displaced intertrochanteric fracture of left femur, subsequent encounter for closed fracture with routine healing: Secondary | ICD-10-CM | POA: Diagnosis not present

## 2017-10-10 DIAGNOSIS — I4891 Unspecified atrial fibrillation: Secondary | ICD-10-CM | POA: Diagnosis not present

## 2017-10-10 MED ORDER — FLUTICASONE-SALMETEROL 232-14 MCG/ACT IN AEPB: 1.0000 | INHALATION_SPRAY | Freq: Two times a day (BID) | RESPIRATORY_TRACT | 0 refills | Status: DC

## 2017-10-10 NOTE — Telephone Encounter (Signed)
Spoke with pt's wife and advised rx sent to pharmacy. Nothing further is needed.   Advised f.u appt due, made an appt for 4/2 at 12:00 pm. Nothing further is needed.

## 2017-10-12 DIAGNOSIS — R262 Difficulty in walking, not elsewhere classified: Secondary | ICD-10-CM | POA: Diagnosis not present

## 2017-10-12 DIAGNOSIS — M6281 Muscle weakness (generalized): Secondary | ICD-10-CM | POA: Diagnosis not present

## 2017-10-17 DIAGNOSIS — R262 Difficulty in walking, not elsewhere classified: Secondary | ICD-10-CM | POA: Diagnosis not present

## 2017-10-17 DIAGNOSIS — M6281 Muscle weakness (generalized): Secondary | ICD-10-CM | POA: Diagnosis not present

## 2017-10-19 DIAGNOSIS — M6281 Muscle weakness (generalized): Secondary | ICD-10-CM | POA: Diagnosis not present

## 2017-10-21 DIAGNOSIS — M6281 Muscle weakness (generalized): Secondary | ICD-10-CM | POA: Diagnosis not present

## 2017-10-21 DIAGNOSIS — R262 Difficulty in walking, not elsewhere classified: Secondary | ICD-10-CM | POA: Diagnosis not present

## 2017-10-24 DIAGNOSIS — R262 Difficulty in walking, not elsewhere classified: Secondary | ICD-10-CM | POA: Diagnosis not present

## 2017-10-24 DIAGNOSIS — M6281 Muscle weakness (generalized): Secondary | ICD-10-CM | POA: Diagnosis not present

## 2017-10-26 DIAGNOSIS — M6281 Muscle weakness (generalized): Secondary | ICD-10-CM | POA: Diagnosis not present

## 2017-10-26 DIAGNOSIS — R262 Difficulty in walking, not elsewhere classified: Secondary | ICD-10-CM | POA: Diagnosis not present

## 2017-10-31 DIAGNOSIS — M6281 Muscle weakness (generalized): Secondary | ICD-10-CM | POA: Diagnosis not present

## 2017-10-31 DIAGNOSIS — R262 Difficulty in walking, not elsewhere classified: Secondary | ICD-10-CM | POA: Diagnosis not present

## 2017-11-01 ENCOUNTER — Other Ambulatory Visit: Payer: Self-pay

## 2017-11-01 ENCOUNTER — Encounter: Payer: Self-pay | Admitting: Pulmonary Disease

## 2017-11-01 ENCOUNTER — Ambulatory Visit (INDEPENDENT_AMBULATORY_CARE_PROVIDER_SITE_OTHER): Payer: Medicare Other | Admitting: Pulmonary Disease

## 2017-11-01 VITALS — BP 122/80 | HR 68 | Wt 170.2 lb

## 2017-11-01 DIAGNOSIS — I25810 Atherosclerosis of coronary artery bypass graft(s) without angina pectoris: Secondary | ICD-10-CM | POA: Diagnosis not present

## 2017-11-01 DIAGNOSIS — J449 Chronic obstructive pulmonary disease, unspecified: Secondary | ICD-10-CM

## 2017-11-01 MED ORDER — FLUTICASONE-SALMETEROL 232-14 MCG/ACT IN AEPB: 1.0000 | INHALATION_SPRAY | Freq: Two times a day (BID) | RESPIRATORY_TRACT | 11 refills | Status: DC

## 2017-11-01 NOTE — Patient Instructions (Signed)
COPD: Continue taking AirDuo twice a day Use Ventolin as needed for chest tightness wheezing or shortness of breath As you participate in physical therapy I encourage you to exercise more frequently Continue to practice good hand hygiene when out in public to prevent infection  We will see you back in 6 months or sooner if needed

## 2017-11-01 NOTE — Progress Notes (Signed)
Subjective:    Patient ID: Larry Briggs, male    DOB: Jan 31, 1936, 82 y.o.   MRN: [id number]  Synopsis: Former patient of Dr. Corrie Dandy has COPD.  Patient has a 40PY smoking history, quit in 1996.  He also has a history of coronary artery disease and had a CABG in 1996.   HPI Chief Complaint  Patient presents with  . Follow-up   Larry Briggs had a stable interval until the end of 2018 but unfortunately in February he had a left femur fracture.  He underwent operative for pair and had a COPD exacerbation afterwards.  He says that he was seen by his primary care physician for cough increasing chest congestion and mucus production and shortness of breath.  He was prescribed a steroid shot and had a chest x-ray which was within normal limits.  He says that he recovered shortly after having the steroid shot.  He now feels back to baseline.  He has no complaints of shortness of breath or cough.   Past Medical History:  Diagnosis Date  . CAD (coronary artery disease)    Remote MI in 1980. CABG 1995. Stent to left main in 2004. Last cath in April of 2012. EF 50%; patent LIMA to DX/LAD with collateralization of the distal right, patent SVG to OM, occluded SVG to distal RCA, and patent stent to LAD  . History of heart attack 1980  . Hypercholesteremia   . Hypertension   . Increased glucose level   . Obesity       Review of Systems  Constitutional: Negative for chills, fatigue and fever.  HENT: Positive for postnasal drip, rhinorrhea and sinus pain.   Respiratory: Positive for shortness of breath. Negative for choking and stridor.   Cardiovascular: Negative for chest pain and leg swelling.       Objective:   Physical Exam  Vitals:   11/01/17 1139  BP: 122/80  Pulse: 68  SpO2: 97%  Weight: 170 lb 3.2 oz (77.2 kg)    Gen: elderly male, chronically ill appearing HENT: OP clear, TM's clear, neck supple PULM: Poor air movement B, normal percussion CV: RRR, no mgr, trace edema GI:  BS+, soft, nontender Derm: no cyanosis or rash Psyche: normal mood and affect   CBC    Component Value Date/Time   WBC 8.5 09/16/2017 1219   WBC 7.0 09/09/2017 0625   RBC 3.73 (L) 09/16/2017 1219   RBC 3.01 (L) 09/09/2017 0625   HGB 11.5 (L) 09/16/2017 1219   HCT 34.3 (L) 09/16/2017 1219   PLT 358 09/16/2017 1219   MCV 92 09/16/2017 1219   MCH 30.8 09/16/2017 1219   MCH 31.2 09/09/2017 0625   MCHC 33.5 09/16/2017 1219   MCHC 34.7 09/09/2017 0625   RDW 16.2 (H) 09/16/2017 1219   LYMPHSABS 0.7 09/16/2017 1219   MONOABS 0.5 09/04/2017 1047   EOSABS 0.1 09/16/2017 1219   BASOSABS 0.0 09/16/2017 1219     PFT: PFT (04/2016)  FEV1  0.89  30%.   Chest imaging: CXR (01/2016)  Cardiomegaly.   Records form his prior pulmonary physician reviewed where he was cared for for his COPD.     Assessment & Plan:  Chronic obstructive pulmonary disease, unspecified COPD type ([place])  Discussion: He had one flare of his COPD after his surgery but has been stable since then.  I am guessing that his hospitalization and surgery prompted this as he seems to be back to baseline now.  If he has  another exacerbation this year then we will need to consider a workup for atypical infections and allergy.  He is been tolerant of the change from Symbicort to Airduo without any major side effects.  He is quite deconditioned and needs to continue exercising.  Plan: COPD: Continue taking AirDuo twice a day Use Ventolin as needed for chest tightness wheezing or shortness of breath As you participate in physical therapy I encourage you to exercise more frequently Continue to practice good hand hygiene when out in public to prevent infection  We will see you back in 6 months or sooner if needed  We will see you back in 6 months or sooner if needed

## 2017-11-04 DIAGNOSIS — R262 Difficulty in walking, not elsewhere classified: Secondary | ICD-10-CM | POA: Diagnosis not present

## 2017-11-04 DIAGNOSIS — M6281 Muscle weakness (generalized): Secondary | ICD-10-CM | POA: Diagnosis not present

## 2017-11-07 DIAGNOSIS — M6281 Muscle weakness (generalized): Secondary | ICD-10-CM | POA: Diagnosis not present

## 2017-11-07 DIAGNOSIS — R262 Difficulty in walking, not elsewhere classified: Secondary | ICD-10-CM | POA: Diagnosis not present

## 2017-11-09 DIAGNOSIS — M6281 Muscle weakness (generalized): Secondary | ICD-10-CM | POA: Diagnosis not present

## 2017-11-09 DIAGNOSIS — R262 Difficulty in walking, not elsewhere classified: Secondary | ICD-10-CM | POA: Diagnosis not present

## 2017-11-14 DIAGNOSIS — R262 Difficulty in walking, not elsewhere classified: Secondary | ICD-10-CM | POA: Diagnosis not present

## 2017-11-14 DIAGNOSIS — M6281 Muscle weakness (generalized): Secondary | ICD-10-CM | POA: Diagnosis not present

## 2017-11-16 DIAGNOSIS — R262 Difficulty in walking, not elsewhere classified: Secondary | ICD-10-CM | POA: Diagnosis not present

## 2017-11-16 DIAGNOSIS — M6281 Muscle weakness (generalized): Secondary | ICD-10-CM | POA: Diagnosis not present

## 2017-11-18 ENCOUNTER — Ambulatory Visit: Payer: Medicare Other | Admitting: Cardiology

## 2017-11-21 DIAGNOSIS — R262 Difficulty in walking, not elsewhere classified: Secondary | ICD-10-CM | POA: Diagnosis not present

## 2017-11-21 DIAGNOSIS — M6281 Muscle weakness (generalized): Secondary | ICD-10-CM | POA: Diagnosis not present

## 2017-11-23 DIAGNOSIS — R262 Difficulty in walking, not elsewhere classified: Secondary | ICD-10-CM | POA: Diagnosis not present

## 2017-11-23 DIAGNOSIS — M6281 Muscle weakness (generalized): Secondary | ICD-10-CM | POA: Diagnosis not present

## 2017-11-27 NOTE — Progress Notes (Signed)
Larry Briggs Date of Birth: [birth year] Medical Record #[id number]  History of Present Illness: Larry Briggs is seen back today for follow up CAD. He has a known history of coronary disease and status post coronary bypass surgery in 1995. He underwent stenting of the left main coronary in 2004. His last cardiac catheterization in April 2012 showed that everything was patent except for a vein graft to the distal right coronary. The PDA was supplied by left to right collaterals. He has been followed in our HTN clinic. He was having side effects on medications. This improved with stopping lisinopril and doxazosin and starting on valsartan. He does have severe COPD. His valsartan was later switched to irbesartan.   He was  admitted in February with left hip fracture after a mechanical fall.  He underwent intramedullary nail in the left intertrochanteric hip.  EKG showed with new T wave inversion in anterolateral leads in the setting of tachycardia and anemia.  Cardiology was consulted for abnormal EKG.  Patient however denied any anginal symptom.  Echocardiogram obtained on 09/07/2017 showed EF 50-55%, grade 1 DD, trivial pericardial effusion.  Overall poor image.  Serial troponins were negative.  Suspicion for angina was low.  Overnight, he went into atrial fibrillation with RVR which was new.  He was not a anticoagulation candidate given significant bleeding risk after surgery.  The plan was to address anticoagulation therapy on follow-up. He subsequently converted to NSR. 30 day event monitor was recommended to see if he had any further Afib. This did demonstrate paroxysmal Afib and he was started on Xarelto. ASA was discontinued.    On follow up today he is doing OK. Still has chronic SOB which is unchanged.  No chest pain. He is quite sedentary.No edema or palpitations. BP is controlled. He has no bleeding problems on Xarelto. He has lost 15 lbs since his accident. Just doesn't have much  appetite.   Current Outpatient Medications on File Prior to Visit  Medication Sig Dispense Refill  . chlorthalidone (HYGROTON) 25 MG tablet Take 1 tablet (25 mg total) by mouth at bedtime. 90 tablet 1  . fish oil-omega-3 fatty acids 1000 MG capsule Take 1 g by mouth daily.     . Fluticasone-Salmeterol (AIRDUO RESPICLICK 093/81) 829-93 MCG/ACT AEPB Inhale 1 puff into the lungs 2 (two) times daily. 1 each 11  . metoprolol succinate (TOPROL-XL) 50 MG 24 hr tablet Take 1.5 tablets by mouth daily.    . multivitamin-iron-minerals-folic acid (CENTRUM) chewable tablet Chew 1 tablet by mouth daily.    . nitroGLYCERIN (NITROSTAT) 0.4 MG SL tablet Place 1 tablet (0.4 mg total) under the tongue as needed. (Patient taking differently: Place 0.4 mg under the tongue every 5 (five) minutes as needed for chest pain. ) 25 tablet 3  . rivaroxaban (XARELTO) 20 MG TABS tablet Take 1 tablet (20 mg total) by mouth daily with supper. 30 tablet 6  . simvastatin (ZOCOR) 20 MG tablet Take 1 tablet (20 mg total) by mouth at bedtime. 90 tablet 2  . tamsulosin (FLOMAX) 0.4 MG CAPS capsule Take 0.4 mg by mouth daily after supper.    . VENTOLIN HFA 108 (90 Base) MCG/ACT inhaler Inhale 2 puffs into the lungs every 4 (four) hours as needed for wheezing or shortness of breath. 1 Inhaler 3   No current facility-administered medications on file prior to visit.     Allergies  Allergen Reactions  . Iodinated Diagnostic Agents Nausea And Vomiting and Rash  Past Medical History:  Diagnosis Date  . CAD (coronary artery disease)    Remote MI in 1980. CABG 1995. Stent to left main in 2004. Last cath in April of 2012. EF 50%; patent LIMA to DX/LAD with collateralization of the distal right, patent SVG to OM, occluded SVG to distal RCA, and patent stent to LAD  . History of heart attack 1980  . Hypercholesteremia   . Hypertension   . Increased glucose level   . Obesity     Past Surgical History:  Procedure Laterality Date   . CARDIAC CATHETERIZATION  1993  . CARDIAC CATHETERIZATION  April 2012   Mild reduction if EF at 50%. Patent LIMA to DX/LAD with collateralization to distal RCA, patent SVG to OM and occluded SVG to distal right and patent stent to LAD/Left main;  . CORONARY ARTERY BYPASS GRAFT  05/1994   x5 LIMA to LAD & DX, SVG to LCX, SVG to RCA  . CORONARY STENT PLACEMENT  2004   Stent to the L main  . INTRAMEDULLARY (IM) NAIL INTERTROCHANTERIC Left 09/05/2017   Procedure: INTRAMEDULLARY (IM) NAIL INTERTROCHANTRIC;  Surgeon: Renette Butters, MD;  Location: [facility];  Service: Orthopedics;  Laterality: Left;    Social History   Tobacco Use  Smoking Status Former Smoker  . Packs/day: 1.00  . Years: 40.00  . Pack years: 40.00  . Types: Cigarettes  . Last attempt to quit: 07/02/1994  . Years since quitting: 23.4  Smokeless Tobacco Never Used    Social History   Substance and Sexual Activity  Alcohol Use No    Family History  Problem Relation Age of Onset  . Heart attack Father   . Hypertension Father   . Stroke Mother     Review of Systems: The review of systems is per the HPI.  All other systems were reviewed and are negative.  Physical Exam: BP 128/76   Pulse 88   Ht 5\' 10"  (1.778 m)   Wt 167 lb 9.6 oz (76 kg)   BMI 24.05 kg/m  GENERAL:  Well appearing WM in NAD HEENT:  PERRL, EOMI, sclera are clear. Oropharynx is clear. NECK:  No jugular venous distention, carotid upstroke brisk and symmetric, no bruits, no thyromegaly or adenopathy LUNGS:  Few wheezes left base CHEST:  Unremarkable HEART:  RRR,  PMI not displaced or sustained,S1 and S2 within normal limits, no S3, no S4: no clicks, no rubs, no murmurs ABD:  Soft, nontender. BS +, no masses or bruits. No hepatomegaly, no splenomegaly EXT:  2 + pulses throughout, no edema, no cyanosis no clubbing SKIN:  Warm and dry.  No rashes NEURO:  Alert and oriented x 3. Cranial nerves II through XII intact. PSYCH:  Cognitively  intact      LABORATORY DATA:   Lab Results  Component Value Date   WBC 8.5 09/16/2017   HGB 11.5 (L) 09/16/2017   HCT 34.3 (L) 09/16/2017   PLT 358 09/16/2017   GLUCOSE 135 (H) 09/09/2017   CHOL  11/14/2010    112        ATP III CLASSIFICATION:  <200     mg/dL   Desirable  200-239  mg/dL   Borderline High  >=240    mg/dL   High          TRIG 108 11/14/2010   HDL 40 11/14/2010   LDLCALC  11/14/2010    50        Total Cholesterol/HDL:CHD Risk Coronary Heart Disease  Risk Table                     Men   Women  1/2 Average Risk   3.4   3.3  Average Risk       5.0   4.4  2 X Average Risk   9.6   7.1  3 X Average Risk  23.4   11.0        Use the calculated Patient Ratio above and the CHD Risk Table to determine the patient's CHD Risk.        ATP III CLASSIFICATION (LDL):  <100     mg/dL   Optimal  100-129  mg/dL   Near or Above                    Optimal  130-159  mg/dL   Borderline  160-189  mg/dL   High  >190     mg/dL   Very High   ALT 17 09/04/2017   AST 25 09/04/2017   NA 135 09/09/2017   K 4.1 09/09/2017   CL 99 (L) 09/09/2017   CREATININE 1.08 09/09/2017   BUN 20 09/09/2017   CO2 25 09/09/2017   TSH 0.800 09/06/2017   INR 1.05 09/04/2017   Labs reviewed from 07/22/16: cholesterol 174, triglycerides 140, LDL 82, HDL 64. CMET normal Except creatinine reported as 242 (this has to be an error).  Dated 01/25/17: A1c 5.9%.  Dated 07/27/17: cholesterol 146, triglycerides 104, HDL 56, LDL 69.   Echo 09/07/17: Study Conclusions  - Left ventricle: The cavity size was normal. Wall thickness was   increased in a pattern of mild LVH. Systolic function was normal.   The estimated ejection fraction was in the range of 50% to 55%.   Doppler parameters are consistent with abnormal left ventricular   relaxation (grade 1 diastolic dysfunction). - Aortic valve: Mildly calcified annulus. - Right atrium: The atrium was mildly dilated. - Pericardium, extracardiac: A  trivial pericardial effusion was   identified.  Impressions:  - Technically difficult echo   Image quality is poor.  Event monitor 09/26/17: Study Highlights    Normal sinus rhythm  Atrial fibrillation- paroxysmal with controlled ventricular response  Isolated PVCs       Assessment / Plan: 1. CAD s/p CABG 1995. S/p stenting of the left main coronary artery 2004.  Cath 2012 showed occlusion of SVG to diagonal. He remains asymptomatic. Continue current anti-anginal Rx.   2. Paroxysmal Afib.Documented recurrence on event monitor. Now on Xarelto for anticoagulation.  3.  HTN- now well controlled. History of orthostasis on Cardura.   4. Hyperlipidemia.  Continue statin and fish oil. At goal.  5. Severe COPD- followed by pulmonary.   Follow up in 6 months.

## 2017-11-28 ENCOUNTER — Encounter: Payer: Self-pay | Admitting: Cardiology

## 2017-11-28 ENCOUNTER — Ambulatory Visit (INDEPENDENT_AMBULATORY_CARE_PROVIDER_SITE_OTHER): Payer: Medicare Other | Admitting: Cardiology

## 2017-11-28 VITALS — BP 128/76 | HR 88 | Ht 70.0 in | Wt 167.6 lb

## 2017-11-28 DIAGNOSIS — M6281 Muscle weakness (generalized): Secondary | ICD-10-CM | POA: Diagnosis not present

## 2017-11-28 DIAGNOSIS — I25810 Atherosclerosis of coronary artery bypass graft(s) without angina pectoris: Secondary | ICD-10-CM | POA: Diagnosis not present

## 2017-11-28 DIAGNOSIS — I480 Paroxysmal atrial fibrillation: Secondary | ICD-10-CM | POA: Diagnosis not present

## 2017-11-28 DIAGNOSIS — R262 Difficulty in walking, not elsewhere classified: Secondary | ICD-10-CM | POA: Diagnosis not present

## 2017-11-28 DIAGNOSIS — I10 Essential (primary) hypertension: Secondary | ICD-10-CM | POA: Diagnosis not present

## 2017-11-28 DIAGNOSIS — E7800 Pure hypercholesterolemia, unspecified: Secondary | ICD-10-CM

## 2017-11-28 MED ORDER — METOPROLOL SUCCINATE ER 50 MG PO TB24: ORAL_TABLET | ORAL | 3 refills | Status: DC

## 2017-11-28 MED ORDER — RIVAROXABAN 20 MG PO TABS: 20.0000 mg | ORAL_TABLET | Freq: Every day | ORAL | 3 refills | Status: DC

## 2017-11-28 NOTE — Patient Instructions (Signed)
Continue your current therapy  I will see you in 6 months.   

## 2017-11-30 DIAGNOSIS — M6281 Muscle weakness (generalized): Secondary | ICD-10-CM | POA: Diagnosis not present

## 2017-11-30 DIAGNOSIS — S72142D Displaced intertrochanteric fracture of left femur, subsequent encounter for closed fracture with routine healing: Secondary | ICD-10-CM | POA: Diagnosis not present

## 2017-12-02 DIAGNOSIS — R262 Difficulty in walking, not elsewhere classified: Secondary | ICD-10-CM | POA: Diagnosis not present

## 2017-12-02 DIAGNOSIS — M6281 Muscle weakness (generalized): Secondary | ICD-10-CM | POA: Diagnosis not present

## 2017-12-05 DIAGNOSIS — M6281 Muscle weakness (generalized): Secondary | ICD-10-CM | POA: Diagnosis not present

## 2017-12-05 DIAGNOSIS — R262 Difficulty in walking, not elsewhere classified: Secondary | ICD-10-CM | POA: Diagnosis not present

## 2017-12-07 DIAGNOSIS — M6281 Muscle weakness (generalized): Secondary | ICD-10-CM | POA: Diagnosis not present

## 2017-12-07 DIAGNOSIS — R262 Difficulty in walking, not elsewhere classified: Secondary | ICD-10-CM | POA: Diagnosis not present

## 2017-12-12 DIAGNOSIS — M6281 Muscle weakness (generalized): Secondary | ICD-10-CM | POA: Diagnosis not present

## 2017-12-12 DIAGNOSIS — R262 Difficulty in walking, not elsewhere classified: Secondary | ICD-10-CM | POA: Diagnosis not present

## 2017-12-14 DIAGNOSIS — R262 Difficulty in walking, not elsewhere classified: Secondary | ICD-10-CM | POA: Diagnosis not present

## 2017-12-14 DIAGNOSIS — M6281 Muscle weakness (generalized): Secondary | ICD-10-CM | POA: Diagnosis not present

## 2017-12-19 DIAGNOSIS — M6281 Muscle weakness (generalized): Secondary | ICD-10-CM | POA: Diagnosis not present

## 2017-12-19 DIAGNOSIS — R262 Difficulty in walking, not elsewhere classified: Secondary | ICD-10-CM | POA: Diagnosis not present

## 2017-12-28 DIAGNOSIS — M6281 Muscle weakness (generalized): Secondary | ICD-10-CM | POA: Diagnosis not present

## 2017-12-28 DIAGNOSIS — R262 Difficulty in walking, not elsewhere classified: Secondary | ICD-10-CM | POA: Diagnosis not present

## 2018-01-03 DIAGNOSIS — R262 Difficulty in walking, not elsewhere classified: Secondary | ICD-10-CM | POA: Diagnosis not present

## 2018-01-03 DIAGNOSIS — M6281 Muscle weakness (generalized): Secondary | ICD-10-CM | POA: Diagnosis not present

## 2018-01-11 DIAGNOSIS — M6281 Muscle weakness (generalized): Secondary | ICD-10-CM | POA: Diagnosis not present

## 2018-01-31 ENCOUNTER — Telehealth: Payer: Self-pay | Admitting: Internal Medicine

## 2018-01-31 NOTE — Telephone Encounter (Signed)
Wife seeking sample AirDuo till [facility] can fill script. They have enough to get through till tomorrow so they are going to call office in [place].

## 2018-02-01 ENCOUNTER — Telehealth: Payer: Self-pay | Admitting: Internal Medicine

## 2018-02-01 MED ORDER — FLUTICASONE-SALMETEROL 232-14 MCG/ACT IN AEPB: 1.0000 | INHALATION_SPRAY | Freq: Two times a day (BID) | RESPIRATORY_TRACT | 0 refills | Status: DC

## 2018-02-01 NOTE — Telephone Encounter (Signed)
Symbicort 160    Inhale 2 puffs then rinse mouth, twice daily

## 2018-02-01 NOTE — Telephone Encounter (Signed)
Called Larry Briggs, advised her that we did not have Airduo samples on her vm and advised her to call back to discuss.

## 2018-02-01 NOTE — Telephone Encounter (Signed)
Called pt but had to leave detailed vm explaining that we did not have any samples of Airduo here in the office. We can leave open to call back to ensure he received the medication from [facility] at least

## 2018-02-01 NOTE — Telephone Encounter (Signed)
Spoke with wife, she wants to know if there is something that he could use for 2 weeks until they receive the Airduo since we do not carry samples of that inhaler. She states it will take Human 2 weeks so she would like an alternative until then. She spoke with CY on the phone last night and she was told to call in the morning. CY please advise what we can give pt to hold him over.   Current Outpatient Medications on File Prior to Visit  Medication Sig Dispense Refill  . chlorthalidone (HYGROTON) 25 MG tablet Take 1 tablet (25 mg total) by mouth at bedtime. 90 tablet 1  . fish oil-omega-3 fatty acids 1000 MG capsule Take 1 g by mouth daily.     . metoprolol succinate (TOPROL-XL) 50 MG 24 hr tablet Take 1 1/2 tablets by mouth daily at bedtime 135 tablet 3  . multivitamin-iron-minerals-folic acid (CENTRUM) chewable tablet Chew 1 tablet by mouth daily.    . nitroGLYCERIN (NITROSTAT) 0.4 MG SL tablet Place 1 tablet (0.4 mg total) under the tongue as needed. (Patient taking differently: Place 0.4 mg under the tongue every 5 (five) minutes as needed for chest pain. ) 25 tablet 3  . rivaroxaban (XARELTO) 20 MG TABS tablet Take 1 tablet (20 mg total) by mouth daily with supper. 90 tablet 3  . simvastatin (ZOCOR) 20 MG tablet Take 1 tablet (20 mg total) by mouth at bedtime. 90 tablet 2  . tamsulosin (FLOMAX) 0.4 MG CAPS capsule Take 0.4 mg by mouth daily after supper.    . VENTOLIN HFA 108 (90 Base) MCG/ACT inhaler Inhale 2 puffs into the lungs every 4 (four) hours as needed for wheezing or shortness of breath. 1 Inhaler 3   No current facility-administered medications on file prior to visit.    Allergies  Allergen Reactions  . Iodinated Diagnostic Agents Nausea And Vomiting and Rash

## 2018-02-01 NOTE — Telephone Encounter (Signed)
CY which strength of Symbicort? We have more of these samples. Please advise.

## 2018-02-01 NOTE — Telephone Encounter (Signed)
They could have local Rx for Wixela 25/50    Inhale 1 puff, then rinse mouth, twice daily, ref prn  This is very similar to AirDuo.    Other brands that are similar are Breo, Symbicort, Dulera, Advair and generic Aidvair

## 2018-02-01 NOTE — Telephone Encounter (Signed)
Thanks. I called pt's wife to advise him that samples were up front. She understood and nothing further is needed.

## 2018-02-01 NOTE — Telephone Encounter (Signed)
Pt is calling about samples of Symbicort [phone]

## 2018-02-01 NOTE — Telephone Encounter (Signed)
Called patients wife, unable to reach left message to give us a call back.  

## 2018-03-16 ENCOUNTER — Other Ambulatory Visit: Payer: Self-pay | Admitting: Physician Assistant

## 2018-04-28 DIAGNOSIS — Z23 Encounter for immunization: Secondary | ICD-10-CM | POA: Diagnosis not present

## 2018-05-03 ENCOUNTER — Encounter: Payer: Self-pay | Admitting: Pulmonary Disease

## 2018-05-03 ENCOUNTER — Ambulatory Visit (INDEPENDENT_AMBULATORY_CARE_PROVIDER_SITE_OTHER): Payer: Medicare Other | Admitting: Pulmonary Disease

## 2018-05-03 VITALS — BP 138/72 | HR 72 | Ht 71.0 in | Wt 164.6 lb

## 2018-05-03 DIAGNOSIS — J449 Chronic obstructive pulmonary disease, unspecified: Secondary | ICD-10-CM

## 2018-05-03 DIAGNOSIS — I25810 Atherosclerosis of coronary artery bypass graft(s) without angina pectoris: Secondary | ICD-10-CM | POA: Diagnosis not present

## 2018-05-03 NOTE — Patient Instructions (Signed)
Severe COPD: Stay active Practice good hand hygiene I am glad you have had a flu shot Continue taking air-duo twice a day as you are doing Continue albuterol as needed for chest tightness wheezing or shortness of breath  Follow-up in 6 months or sooner if needed

## 2018-05-03 NOTE — Progress Notes (Signed)
Subjective:    Patient ID: Larry Briggs, male    DOB: [birth year], 82 y.o.   MRN: [id number]  Synopsis: Former patient of Dr. Corrie Briggs has COPD.  Patient has a 40PY smoking history, quit in 1996.  He also has a history of coronary artery disease and had a CABG in 1996.   HPI Chief Complaint  Patient presents with  . Follow-up    breathing about like last visit - has mild hacky dry cough but was told this related to meds   Larry Briggs says that his breathing has been okay since the last visit.  He has not had problems with shortness of breath.  He produces a little bit of mucus from time to time but this is not very frequent.  He has not had bronchitis or pneumonia.  He continues to take his inhaler twice a day which is helpful (Air-duo).  He had a flu shot already this year.   Past Medical History:  Diagnosis Date  . CAD (coronary artery disease)    Remote MI in 1980. CABG 1995. Stent to left main in 2004. Last cath in April of 2012. EF 50%; patent LIMA to DX/LAD with collateralization of the distal right, patent SVG to OM, occluded SVG to distal RCA, and patent stent to LAD  . History of heart attack 1980  . Hypercholesteremia   . Hypertension   . Increased glucose level   . Obesity       Review of Systems  Constitutional: Negative for chills, fatigue and fever.  HENT: Positive for postnasal drip, rhinorrhea and sinus pain.   Respiratory: Positive for shortness of breath. Negative for choking and stridor.   Cardiovascular: Negative for chest pain and leg swelling.       Objective:   Physical Exam  Vitals:   05/03/18 1126  BP: 138/72  Pulse: 72  SpO2: 98%  Weight: 164 lb 9.6 oz (74.7 kg)  Height: 5\' 11"  (1.803 m)    Gen: well appearing HENT: OP clear, TM's clear, neck supple PULM: CTA B, normal percussion CV: RRR, no mgr, trace edema GI: BS+, soft, nontender Derm: no cyanosis or rash Psyche: normal mood and affect    CBC    Component Value Date/Time   WBC  8.5 09/16/2017 1219   WBC 7.0 09/09/2017 0625   RBC 3.73 (L) 09/16/2017 1219   RBC 3.01 (L) 09/09/2017 0625   HGB 11.5 (L) 09/16/2017 1219   HCT 34.3 (L) 09/16/2017 1219   PLT 358 09/16/2017 1219   MCV 92 09/16/2017 1219   MCH 30.8 09/16/2017 1219   MCH 31.2 09/09/2017 0625   MCHC 33.5 09/16/2017 1219   MCHC 34.7 09/09/2017 0625   RDW 16.2 (H) 09/16/2017 1219   LYMPHSABS 0.7 09/16/2017 1219   MONOABS 0.5 09/04/2017 1047   EOSABS 0.1 09/16/2017 1219   BASOSABS 0.0 09/16/2017 1219     PFT: PFT (04/2016)  FEV1  0.89  30%.   Chest imaging: CXR (01/2016)  Cardiomegaly.   April 2019 cardiology records reviewed where he was seen for A. fib, coronary disease and maintained on Xarelto and Cardura.     Assessment & Plan:    Chronic obstructive pulmonary disease, unspecified COPD type ([place])  Discussion: This has been a stable interval for Larry Briggs.  He has not had an exacerbation since the last visit. He has severe COPD but this is well managed with his combination bronchodilator and inhaled corticosteroid.  He has had a flu  shot.  We talked about the importance of hand hygiene today.  Plan: Severe COPD: Stay active Practice good hand hygiene I am glad you have had a flu shot Continue taking air-duo twice a day as you are doing Continue albuterol as needed for chest tightness wheezing or shortness of breath  Follow-up in 6 months or sooner if needed  Greater than 50% of this 15-minute visit spent face to    Current Outpatient Medications:  .  Bioflavonoid Products (ESTER-C) 500-550 MG TABS, Take 500 mg by mouth daily., Disp: , Rfl:  .  chlorthalidone (HYGROTON) 25 MG tablet, TAKE 1 TABLET BY MOUTH AT BEDTIME, Disp: 90 tablet, Rfl: 1 .  fish oil-omega-3 fatty acids 1000 MG capsule, Take by mouth daily. , Disp: , Rfl:  .  Fluticasone-Salmeterol (AIRDUO RESPICLICK 003/70) 488-89 MCG/ACT AEPB, Inhale 1 puff into the lungs 2 (two) times daily., Disp: 3 each, Rfl: 0 .   metoprolol succinate (TOPROL-XL) 50 MG 24 hr tablet, Take 1 1/2 tablets by mouth daily at bedtime, Disp: 135 tablet, Rfl: 3 .  multivitamin-iron-minerals-folic acid (CENTRUM) chewable tablet, Chew 1 tablet by mouth daily., Disp: , Rfl:  .  nitroGLYCERIN (NITROSTAT) 0.4 MG SL tablet, Place 1 tablet (0.4 mg total) under the tongue as needed. (Patient taking differently: Place 0.4 mg under the tongue every 5 (five) minutes as needed for chest pain. ), Disp: 25 tablet, Rfl: 3 .  rivaroxaban (XARELTO) 20 MG TABS tablet, Take 1 tablet (20 mg total) by mouth daily with supper., Disp: 90 tablet, Rfl: 3 .  simvastatin (ZOCOR) 20 MG tablet, Take 1 tablet (20 mg total) by mouth at bedtime., Disp: 90 tablet, Rfl: 2 .  tamsulosin (FLOMAX) 0.4 MG CAPS capsule, Take 0.4 mg by mouth daily after supper., Disp: , Rfl:  .  VENTOLIN HFA 108 (90 Base) MCG/ACT inhaler, Inhale 2 puffs into the lungs every 4 (four) hours as needed for wheezing or shortness of breath., Disp: 1 Inhaler, Rfl: 3

## 2018-05-04 ENCOUNTER — Telehealth: Payer: Self-pay | Admitting: Cardiology

## 2018-05-04 MED ORDER — SIMVASTATIN 20 MG PO TABS: 20.0000 mg | ORAL_TABLET | Freq: Every day | ORAL | 0 refills | Status: DC

## 2018-05-04 NOTE — Telephone Encounter (Signed)
New Message    *STAT* If patient is at the pharmacy, call can be transferred to refill team.   1. Which medications need to be refilled? (please list name of each medication and dose if known) simvastatin (ZOCOR) 20 MG tablet  2. Which pharmacy/location (including street and city if local pharmacy) is medication to be sent to? [place], [place]  3. Do they need a 30 day or 90 day supply? 90 day ;

## 2018-05-26 NOTE — Progress Notes (Signed)
[name] Date of Birth: 02-17-1936 Medical Record #[id number]  History of Present Illness: Larry Briggs is seen back today for follow up CAD. He has a known history of coronary disease and status post coronary bypass surgery in 1995. He underwent stenting of the left main coronary in 2004. His last cardiac catheterization in April 2012 showed that everything was patent except for a vein graft to the distal right coronary. The PDA was supplied by left to right collaterals. He has been followed in our HTN clinic. He was having side effects on medications. This improved with stopping lisinopril and doxazosin and starting on valsartan. He does have severe COPD. His valsartan was later switched to irbesartan and then this was later discontinued.   He was  admitted in February with left hip fracture after a mechanical fall.  He underwent intramedullary nail in the left intertrochanteric hip.  EKG showed with new T wave inversion in anterolateral leads in the setting of tachycardia and anemia.  Cardiology was consulted for abnormal EKG.  Patient however denied any anginal symptom.  Echocardiogram obtained on 09/07/2017 showed EF 50-55%, grade 1 DD, trivial pericardial effusion.  Overall poor image.  Serial troponins were negative.  Suspicion for angina was low.  Overnight, he went into atrial fibrillation with RVR which was new.  He was not a anticoagulation candidate given significant bleeding risk after surgery.  The plan was to address anticoagulation therapy on follow-up. He subsequently converted to NSR. 30 day event monitor was recommended to see if he had any further Afib. This did demonstrate paroxysmal Afib and he was started on Xarelto. ASA was discontinued.    On follow up today he is doing OK. Recently seen by pulmonary for URI. Treated with steroids and antibiotics. He is feeling better now. Has chronic SOB.   No chest pain. He is quite sedentary.No edema or palpitations.  He has no bleeding  problems on Xarelto. Weight is stable.    Current Outpatient Medications on File Prior to Visit  Medication Sig Dispense Refill  . Bioflavonoid Products (ESTER-C) 500-550 MG TABS Take 500 mg by mouth daily.    . fish oil-omega-3 fatty acids 1000 MG capsule Take by mouth daily.     . Fluticasone-Salmeterol (AIRDUO RESPICLICK 916/38) 466-59 MCG/ACT AEPB Inhale 1 puff into the lungs 2 (two) times daily. 3 each 0  . metoprolol succinate (TOPROL-XL) 50 MG 24 hr tablet Take 1 1/2 tablets by mouth daily at bedtime 135 tablet 3  . multivitamin-iron-minerals-folic acid (CENTRUM) chewable tablet Chew 1 tablet by mouth daily.    . nitroGLYCERIN (NITROSTAT) 0.4 MG SL tablet Place 1 tablet (0.4 mg total) under the tongue as needed. (Patient taking differently: Place 0.4 mg under the tongue every 5 (five) minutes as needed for chest pain. ) 25 tablet 3  . simvastatin (ZOCOR) 20 MG tablet Take 1 tablet (20 mg total) by mouth at bedtime. MUST KEEP OV FOR FUTURE REFILLS 30 tablet 0  . tamsulosin (FLOMAX) 0.4 MG CAPS capsule Take 0.4 mg by mouth daily after supper.    . VENTOLIN HFA 108 (90 Base) MCG/ACT inhaler Inhale 2 puffs into the lungs every 4 (four) hours as needed for wheezing or shortness of breath. 1 Inhaler 3   No current facility-administered medications on file prior to visit.     Allergies  Allergen Reactions  . Iodinated Diagnostic Agents Nausea And Vomiting and Rash    Past Medical History:  Diagnosis Date  . CAD (coronary  artery disease)    Remote MI in 1980. CABG 1995. Stent to left main in 2004. Last cath in April of 2012. EF 50%; patent LIMA to DX/LAD with collateralization of the distal right, patent SVG to OM, occluded SVG to distal RCA, and patent stent to LAD  . History of heart attack 1980  . Hypercholesteremia   . Hypertension   . Increased glucose level   . Obesity     Past Surgical History:  Procedure Laterality Date  . CARDIAC CATHETERIZATION  1993  . CARDIAC  CATHETERIZATION  April 2012   Mild reduction if EF at 50%. Patent LIMA to DX/LAD with collateralization to distal RCA, patent SVG to OM and occluded SVG to distal right and patent stent to LAD/Left main;  . CORONARY ARTERY BYPASS GRAFT  05/1994   x5 LIMA to LAD & DX, SVG to LCX, SVG to RCA  . CORONARY STENT PLACEMENT  2004   Stent to the L main  . INTRAMEDULLARY (IM) NAIL INTERTROCHANTERIC Left 09/05/2017   Procedure: INTRAMEDULLARY (IM) NAIL INTERTROCHANTRIC;  Surgeon: Renette Butters, MD;  Location: [employer] [facility];  Service: Orthopedics;  Laterality: Left;    Social History   Tobacco Use  Smoking Status Former Smoker  . Packs/day: 1.00  . Years: 40.00  . Pack years: 40.00  . Types: Cigarettes  . Last attempt to quit: 07/02/1994  . Years since quitting: 23.9  Smokeless Tobacco Never Used    Social History   Substance and Sexual Activity  Alcohol Use No    Family History  Problem Relation Age of Onset  . Heart attack Father   . Hypertension Father   . Stroke Mother     Review of Systems: The review of systems is per the HPI.  All other systems were reviewed and are negative.  Physical Exam: BP 121/72   Pulse 96   Ht 5\' 10"  (1.778 m)   Wt 162 lb 9.6 oz (73.8 kg)   BMI 23.33 kg/m  GENERAL:  Well appearing elderly WM in NAD HEENT:  PERRL, EOMI, sclera are clear. Oropharynx is clear. NECK:  No jugular venous distention, carotid upstroke brisk and symmetric, no bruits, no thyromegaly or adenopathy LUNGS:  Few wheezes left base CHEST:  Unremarkable HEART:  RRR with extrasystoles,  PMI not displaced or sustained,S1 and S2 within normal limits, no S3, no S4: no clicks, no rubs, no murmurs ABD:  Soft, nontender. BS +, no masses or bruits. No hepatomegaly, no splenomegaly EXT:  2 + pulses throughout, no edema, no cyanosis no clubbing SKIN:  Warm and dry.  No rashes NEURO:  Alert and oriented x 3. Cranial nerves II through XII intact. PSYCH:  Cognitively  intact      LABORATORY DATA:   Lab Results  Component Value Date   WBC 8.5 09/16/2017   HGB 11.5 (L) 09/16/2017   HCT 34.3 (L) 09/16/2017   PLT 358 09/16/2017   GLUCOSE 135 (H) 09/09/2017   CHOL  11/14/2010    112        ATP III CLASSIFICATION:  <200     mg/dL   Desirable  200-239  mg/dL   Borderline High  >=240    mg/dL   High          TRIG 108 11/14/2010   HDL 40 11/14/2010   LDLCALC  11/14/2010    50        Total Cholesterol/HDL:CHD Risk Coronary Heart Disease Risk Table  Men   Women  1/2 Average Risk   3.4   3.3  Average Risk       5.0   4.4  2 X Average Risk   9.6   7.1  3 X Average Risk  23.4   11.0        Use the calculated Patient Ratio above and the CHD Risk Table to determine the patient's CHD Risk.        ATP III CLASSIFICATION (LDL):  <100     mg/dL   Optimal  100-129  mg/dL   Near or Above                    Optimal  130-159  mg/dL   Borderline  160-189  mg/dL   High  >190     mg/dL   Very High   ALT 17 09/04/2017   AST 25 09/04/2017   NA 135 09/09/2017   K 4.1 09/09/2017   CL 99 (L) 09/09/2017   CREATININE 1.08 09/09/2017   BUN 20 09/09/2017   CO2 25 09/09/2017   TSH 0.800 09/06/2017   INR 1.05 09/04/2017   Labs reviewed from 07/22/16: cholesterol 174, triglycerides 140, LDL 82, HDL 64. CMET normal Except creatinine reported as 242 (this has to be an error).  Dated 01/25/17: A1c 5.9%.  Dated 07/27/17: cholesterol 146, triglycerides 104, HDL 56, LDL 69.   Echo 09/07/17: Study Conclusions  - Left ventricle: The cavity size was normal. Wall thickness was   increased in a pattern of mild LVH. Systolic function was normal.   The estimated ejection fraction was in the range of 50% to 55%.   Doppler parameters are consistent with abnormal left ventricular   relaxation (grade 1 diastolic dysfunction). - Aortic valve: Mildly calcified annulus. - Right atrium: The atrium was mildly dilated. - Pericardium, extracardiac: A  trivial pericardial effusion was   identified.  Impressions:  - Technically difficult echo   Image quality is poor.  Event monitor 09/26/17: Study Highlights    Normal sinus rhythm  Atrial fibrillation- paroxysmal with controlled ventricular response  Isolated PVCs       Assessment / Plan: 1. CAD s/p CABG 1995. S/p stenting of the left main coronary artery 2004.  Cath 2012 showed occlusion of SVG to diagonal. He remains asymptomatic. Continue current anti-anginal Rx.   2. Paroxysmal Afib. Documented recurrence on event monitor. Largely asymptomatic. Now on Xarelto for anticoagulation. Tolerating it well.   3.  HTN- now well controlled on Toprol only. History of orthostasis on Cardura.   4. Hyperlipidemia.  Continue statin and fish oil. At goal.  5. Severe COPD- followed by pulmonary.   Follow up in 6 months.

## 2018-05-29 DIAGNOSIS — R350 Frequency of micturition: Secondary | ICD-10-CM | POA: Diagnosis not present

## 2018-05-29 DIAGNOSIS — N5201 Erectile dysfunction due to arterial insufficiency: Secondary | ICD-10-CM | POA: Diagnosis not present

## 2018-06-01 ENCOUNTER — Ambulatory Visit (INDEPENDENT_AMBULATORY_CARE_PROVIDER_SITE_OTHER): Payer: Medicare Other | Admitting: Pulmonary Disease

## 2018-06-01 ENCOUNTER — Encounter: Payer: Self-pay | Admitting: Pulmonary Disease

## 2018-06-01 ENCOUNTER — Ambulatory Visit (INDEPENDENT_AMBULATORY_CARE_PROVIDER_SITE_OTHER)
Admission: RE | Admit: 2018-06-01 | Discharge: 2018-06-01 | Disposition: A | Discharge status: Home / Self Care | Payer: Medicare Other | Source: Ambulatory Visit | Attending: Pulmonary Disease | Admitting: Pulmonary Disease

## 2018-06-01 VITALS — BP 120/64 | HR 88 | Temp 97.5°F | Ht 70.0 in | Wt 163.4 lb

## 2018-06-01 DIAGNOSIS — R05 Cough: Secondary | ICD-10-CM | POA: Diagnosis not present

## 2018-06-01 DIAGNOSIS — J449 Chronic obstructive pulmonary disease, unspecified: Secondary | ICD-10-CM

## 2018-06-01 DIAGNOSIS — R059 Cough, unspecified: Secondary | ICD-10-CM

## 2018-06-01 LAB — NITRIC OXIDE: NITRIC OXIDE: 30

## 2018-06-01 MED ORDER — AZITHROMYCIN 250 MG PO TABS: ORAL_TABLET | ORAL | 0 refills | Status: DC

## 2018-06-01 MED ORDER — PREDNISONE 10 MG PO TABS: ORAL_TABLET | ORAL | 0 refills | Status: DC

## 2018-06-01 NOTE — Patient Instructions (Addendum)
FENO today  >>>30  Azithromycin 250mg  tablet  >>>Take 2 tablets (500mg  total) today, and then 1 tablet (250mg ) for the next four days  >>>take with food  >>>can also take probiotic and / or yogurt while on antibiotic   Prednisone 10mg  tablet  >>>Take 2 tablets (20 mg total) daily for the next 5 days >>> Take with food in the morning   Chest Xray today   Can consider starting daily antihistamine such as Zyrtec, Allegra, or Claritin >>>Choose generic >>>Choose 1 of them  Note your daily symptoms > remember "red flags" for COPD:   >>>Increase in cough >>>increase in sputum production >>>increase in shortness of breath or activity  intolerance.   If you notice these symptoms, please call the office to be seen.     Follow-up with our office in 2 to 3 months or sooner if symptoms worsen  November/2019 we will be moving! We will no longer be at our [place] location.  Be on the look out for a post card/mailer to let you know we have officially moved.  Our new address and phone number will be:  [address] [place], [place] [address] Telephone number: [phone]  It is flu season:   >>>Remember to be washing your hands regularly, using hand sanitizer, be careful to use around herself with has contact with people who are sick will increase her chances of getting sick yourself. >>> Best ways to protect herself from the flu: Receive the yearly flu vaccine, practice good hand hygiene washing with soap and also using hand sanitizer when available, eat a nutritious meals, get adequate rest, hydrate appropriately   Please contact the office if your symptoms worsen or you have concerns that you are not improving.   Thank you for choosing [facility] for your healthcare, and for allowing [place] to partner with you on your healthcare journey. I am thankful to be able to provide care to you today.   Wyn Quaker FNP-C

## 2018-06-01 NOTE — Progress Notes (Signed)
@Patient  ID: Larry Briggs, male    DOB: 08-26-1935, 82 y.o.   MRN: [id number]  Chief Complaint  Patient presents with  . Acute Visit    SOB, COugh    Referring provider: [name], MD  HPI:  82 year old male former smoker followed in our office for COPD  PMH: A. fib (on Xarelto) Smoker/ Smoking History: Former smoker.  40 pack years. Maintenance: Air duo Pt of: Dr. Lake Bells  Recent [facility] Encounters:     06/01/2018  - Visit   82 year old male patient presenting today for acute visit.  Patient and spouse are both present for appointment today.  Patient spouse reports that they receive the shingles vaccine 3 days later on 05/26/2018 started to have increased nasal congestion and postnasal drip.  Patient reports that he does have a worsening cough.  Sputum color has changed from baseline of clear to greenish-yellow.  Patient reports he feels like he is more short of breath.  Patient reports that he feels significantly worse and had a filter clinic this month at his last office he is continued to be adherent to his air duo inhaler.  Patient using his rescue inhaler 1-2 times a week.  Patient reports that he typically has these flares at change of seasons.  Patient is afebrile today.  Patient's most persistent complaint is his postnasal drip.  Patient spouse is also sick.  Patient want to present today is Dr. Lake Bells I told them in the past that if he got sick or had sputum color changes he needed to present to our office as he may need antibiotic.  Patient also remembers Dr. Lake Bells telling him that it may be harder for patient to recover from his illness as he has COPD.  MMRC - Breathlessness Score 2 - on level ground, I walk slower than people of the same age because of breathlessness, or have to stop for breathe when walking to my own pace     Tests:  PFT: PFT (04/2016)  FEV1  0.89  30%.   Chest imaging: CXR (01/2016)  Cardiomegaly.   April 2019 cardiology  records reviewed where he was seen for A. fib, coronary disease and maintained on Xarelto and Cardura.   FENO:  Lab Results  Component Value Date   NITRICOXIDE 30 06/01/2018    PFT: PFT Results Latest Ref Rng & Units 04/26/2016  FVC-Pre L 2.20  FVC-Predicted Pre % 53  FVC-Post L 2.41  FVC-Predicted Post % 58  Pre FEV1/FVC % % 40  Post FEV1/FCV % % 37  FEV1-Pre L 0.89  FEV1-Predicted Pre % 30  DLCO UNC% % 41  DLCO COR %Predicted % 53  TLC L 8.20  TLC % Predicted % 116  RV % Predicted % 191    Imaging: Dg Chest 2 View  Result Date: 06/01/2018 CLINICAL DATA:  Productive cough and dyspnea x1 week. EXAM: CHEST - 2 VIEW COMPARISON:  10/05/2017 FINDINGS: Normal cardiopericardial silhouette size. Nonaneurysmal atherosclerotic aorta. Status post CABG. Emphysematous hyperinflation of the lungs. Small foci of atelectasis are identified in the right middle lobe distribution best seen on the lateral view as well as overlying the right lateral costophrenic angle. No conclusive evidence for pneumonia. No overt pulmonary edema, effusion or pneumothorax. IMPRESSION: COPD with aortic atherosclerosis. New areas of atelectasis in the right middle and lower lobes are suggested. Electronically Signed   By: Ashley Royalty M.D.   On: 06/01/2018 15:28    Chart Review:    Specialty Problems  Pulmonary Problems   COPD (chronic obstructive pulmonary disease) (HCC)   Cough      Allergies  Allergen Reactions  . Iodinated Diagnostic Agents Nausea And Vomiting and Rash    Immunization History  Administered Date(s) Administered  . Influenza Inj Mdck Quad Pf 04/20/2018  . Influenza Split 04/16/2016  . Influenza, High Dose Seasonal PF 05/16/2015  . Pneumococcal Conjugate-13 06/14/2014  . Zoster 06/07/2006    Past Medical History:  Diagnosis Date  . CAD (coronary artery disease)    Remote MI in 1980. CABG 1995. Stent to left main in 2004. Last cath in April of 2012. EF 50%; patent LIMA to  DX/LAD with collateralization of the distal right, patent SVG to OM, occluded SVG to distal RCA, and patent stent to LAD  . History of heart attack 1980  . Hypercholesteremia   . Hypertension   . Increased glucose level   . Obesity     Tobacco History: Social History   Tobacco Use  Smoking Status Former Smoker  . Packs/day: 1.00  . Years: 40.00  . Pack years: 40.00  . Types: Cigarettes  . Last attempt to quit: 07/02/1994  . Years since quitting: 23.9  Smokeless Tobacco Never Used   Counseling given: Yes  Continue to not smoke.  Outpatient Encounter Medications as of 06/01/2018  Medication Sig  . Bioflavonoid Products (ESTER-C) 500-550 MG TABS Take 500 mg by mouth daily.  . chlorthalidone (HYGROTON) 25 MG tablet TAKE 1 TABLET BY MOUTH AT BEDTIME  . fish oil-omega-3 fatty acids 1000 MG capsule Take by mouth daily.   . Fluticasone-Salmeterol (AIRDUO RESPICLICK 841/32) 440-10 MCG/ACT AEPB Inhale 1 puff into the lungs 2 (two) times daily.  . metoprolol succinate (TOPROL-XL) 50 MG 24 hr tablet Take 1 1/2 tablets by mouth daily at bedtime  . multivitamin-iron-minerals-folic acid (CENTRUM) chewable tablet Chew 1 tablet by mouth daily.  . nitroGLYCERIN (NITROSTAT) 0.4 MG SL tablet Place 1 tablet (0.4 mg total) under the tongue as needed. (Patient taking differently: Place 0.4 mg under the tongue every 5 (five) minutes as needed for chest pain. )  . rivaroxaban (XARELTO) 20 MG TABS tablet Take 1 tablet (20 mg total) by mouth daily with supper.  . simvastatin (ZOCOR) 20 MG tablet Take 1 tablet (20 mg total) by mouth at bedtime. MUST KEEP OV FOR FUTURE REFILLS  . tamsulosin (FLOMAX) 0.4 MG CAPS capsule Take 0.4 mg by mouth daily after supper.  . VENTOLIN HFA 108 (90 Base) MCG/ACT inhaler Inhale 2 puffs into the lungs every 4 (four) hours as needed for wheezing or shortness of breath.  Marland Kitchen azithromycin (ZITHROMAX) 250 MG tablet 500mg  (two tablets) today, then 250mg  (1 tablet) for the next 4  days  . predniSONE (DELTASONE) 10 MG tablet Take 2 tablets (20mg  total) daily for the next 5 days. Take in the AM with food.   No facility-administered encounter medications on file as of 06/01/2018.      Review of Systems  Review of Systems  Constitutional: Positive for fatigue. Negative for activity change, chills, fever and unexpected weight change.  HENT: Positive for postnasal drip. Negative for rhinorrhea, sinus pressure, sinus pain, sneezing and sore throat.   Eyes: Negative.   Respiratory: Positive for cough (occasionally productive ), shortness of breath and wheezing.   Cardiovascular: Negative for chest pain and palpitations.  Gastrointestinal: Negative for constipation, diarrhea, nausea and vomiting.  Endocrine: Negative.   Musculoskeletal: Negative.   Skin: Negative.   Allergic/Immunologic: Positive for environmental allergies.  Neurological: Negative for dizziness and headaches.  Psychiatric/Behavioral: Negative.  Negative for dysphoric mood. The patient is not nervous/anxious.   All other systems reviewed and are negative.    Physical Exam  BP 120/64 (BP Location: Left Arm, Cuff Size: Normal)   Pulse 88   Temp (!) 97.5 F (36.4 C) (Oral)   Ht 5\' 10"  (1.778 m)   Wt 163 lb 6.4 oz (74.1 kg)   SpO2 92%   BMI 23.45 kg/m   Wt Readings from Last 5 Encounters:  06/01/18 163 lb 6.4 oz (74.1 kg)  05/03/18 164 lb 9.6 oz (74.7 kg)  11/28/17 167 lb 9.6 oz (76 kg)  11/01/17 170 lb 3.2 oz (77.2 kg)  09/08/17 182 lb 1.6 oz (82.6 kg)     Physical Exam  Constitutional: He is oriented to person, place, and time and well-developed, well-nourished, and in no distress. No distress.  HENT:  Head: Normocephalic and atraumatic.  Right Ear: Hearing, tympanic membrane, external ear and ear canal normal.  Left Ear: Hearing and external ear normal.  Nose: Mucosal edema and rhinorrhea present. Right sinus exhibits no maxillary sinus tenderness and no frontal sinus tenderness. Left  sinus exhibits no maxillary sinus tenderness and no frontal sinus tenderness.  Mouth/Throat: Uvula is midline and oropharynx is clear and moist. No oropharyngeal exudate.  + Unable to visualize left TM as the canal is occluded with cerumen.  Eyes: Pupils are equal, round, and reactive to light.  Neck: Normal range of motion. Neck supple. No JVD present.  Cardiovascular: Normal rate, regular rhythm and normal heart sounds.  Pulmonary/Chest: Effort normal. No accessory muscle usage. No respiratory distress. He has decreased breath sounds in the right lower field. He has no wheezes. He has no rhonchi.  +coarse crackles in Right base   Musculoskeletal: Normal range of motion. He exhibits no edema.  Lymphadenopathy:    He has no cervical adenopathy.  Neurological: He is alert and oriented to person, place, and time. Gait normal.  Skin: Skin is warm and dry. He is not diaphoretic. No erythema.  Psychiatric: Mood, memory, affect and judgment normal.  Nursing note and vitals reviewed.     Lab Results:  CBC    Component Value Date/Time   WBC 8.5 09/16/2017 1219   WBC 7.0 09/09/2017 0625   RBC 3.73 (L) 09/16/2017 1219   RBC 3.01 (L) 09/09/2017 0625   HGB 11.5 (L) 09/16/2017 1219   HCT 34.3 (L) 09/16/2017 1219   PLT 358 09/16/2017 1219   MCV 92 09/16/2017 1219   MCH 30.8 09/16/2017 1219   MCH 31.2 09/09/2017 0625   MCHC 33.5 09/16/2017 1219   MCHC 34.7 09/09/2017 0625   RDW 16.2 (H) 09/16/2017 1219   LYMPHSABS 0.7 09/16/2017 1219   MONOABS 0.5 09/04/2017 1047   EOSABS 0.1 09/16/2017 1219   BASOSABS 0.0 09/16/2017 1219    BMET    Component Value Date/Time   NA 135 09/09/2017 0625   K 4.1 09/09/2017 0625   CL 99 (L) 09/09/2017 0625   CO2 25 09/09/2017 0625   GLUCOSE 135 (H) 09/09/2017 0625   BUN 20 09/09/2017 0625   CREATININE 1.08 09/09/2017 0625   CREATININE 0.86 03/09/2016 0926   CALCIUM 8.3 (L) 09/09/2017 0625   GFRNONAA >60 09/09/2017 0625   GFRAA >60 09/09/2017 0625     BNP No results found for: BNP  ProBNP No results found for: PROBNP    Assessment & Plan:   Pleasant 82 year old male patient completing acute  visit today.  Will treat patient has COPD exacerbation.  Patient does have coarse crackles in the right lung base.  Lung sounds are also slightly more dull chest x-ray today.  Z-Pak.  Short course of prednisone.  FeNO today was 30 patient to follow-up in 2 to 3 months or sooner if symptoms are not improving.  COPD (chronic obstructive pulmonary disease) (HCC) FENO today  >>>30  Azithromycin 250mg  tablet  >>>Take 2 tablets (500mg  total) today, and then 1 tablet (250mg ) for the next four days  >>>take with food  >>>can also take probiotic and / or yogurt while on antibiotic   Prednisone 10mg  tablet  >>>Take 2 tablets (20 mg total) daily for the next 5 days >>> Take with food in the morning   Chest Xray today    Continue air duo >>>Rinse mouth out after use  Can consider starting daily antihistamine such as Zyrtec, Allegra, or Claritin >>>Choose generic >>>Choose 1 of them  Note your daily symptoms > remember "red flags" for COPD:   >>>Increase in cough >>>increase in sputum production >>>increase in shortness of breath or activity  intolerance.   If you notice these symptoms, please call the office to be seen.     Follow-up with our office in 2 to 3 months or sooner if symptoms worsen   This appointment was 29 minutes along with over 50% that time direct face-to-face patient care, assessment, plan of care follow-up, discussion of FeNO.  Lauraine Rinne, NP 06/01/2018

## 2018-06-01 NOTE — Assessment & Plan Note (Signed)
FENO today  >>>30  Azithromycin 250mg  tablet  >>>Take 2 tablets (500mg  total) today, and then 1 tablet (250mg ) for the next four days  >>>take with food  >>>can also take probiotic and / or yogurt while on antibiotic   Prednisone 10mg  tablet  >>>Take 2 tablets (20 mg total) daily for the next 5 days >>> Take with food in the morning   Chest Xray today    Continue air duo >>>Rinse mouth out after use  Can consider starting daily antihistamine such as Zyrtec, Allegra, or Claritin >>>Choose generic >>>Choose 1 of them  Note your daily symptoms > remember "red flags" for COPD:   >>>Increase in cough >>>increase in sputum production >>>increase in shortness of breath or activity  intolerance.   If you notice these symptoms, please call the office to be seen.     Follow-up with our office in 2 to 3 months or sooner if symptoms worsen

## 2018-06-02 NOTE — Progress Notes (Signed)
Reviewed, agree 

## 2018-06-04 NOTE — Progress Notes (Signed)
Discussed results with patient in office.  Nothing further is needed at this time.  Larry Pocius FNP  

## 2018-06-05 ENCOUNTER — Ambulatory Visit (INDEPENDENT_AMBULATORY_CARE_PROVIDER_SITE_OTHER): Payer: Medicare Other | Admitting: Cardiology

## 2018-06-05 ENCOUNTER — Encounter: Payer: Self-pay | Admitting: Cardiology

## 2018-06-05 VITALS — BP 121/72 | HR 96 | Ht 70.0 in | Wt 162.6 lb

## 2018-06-05 DIAGNOSIS — I25810 Atherosclerosis of coronary artery bypass graft(s) without angina pectoris: Secondary | ICD-10-CM | POA: Diagnosis not present

## 2018-06-05 DIAGNOSIS — E7800 Pure hypercholesterolemia, unspecified: Secondary | ICD-10-CM

## 2018-06-05 DIAGNOSIS — I10 Essential (primary) hypertension: Secondary | ICD-10-CM

## 2018-06-05 DIAGNOSIS — I480 Paroxysmal atrial fibrillation: Secondary | ICD-10-CM

## 2018-06-05 MED ORDER — RIVAROXABAN 20 MG PO TABS: 20.0000 mg | ORAL_TABLET | Freq: Every day | ORAL | 11 refills | Status: DC

## 2018-06-05 MED ORDER — CHLORTHALIDONE 25 MG PO TABS: 25.0000 mg | ORAL_TABLET | Freq: Every day | ORAL | 3 refills | Status: DC

## 2018-06-05 NOTE — Patient Instructions (Addendum)
Continue current therapy  I will see you in 6 months

## 2018-06-06 ENCOUNTER — Telehealth: Payer: Self-pay | Admitting: *Deleted

## 2018-06-06 DIAGNOSIS — J430 Unilateral pulmonary emphysema [MacLeod's syndrome]: Secondary | ICD-10-CM

## 2018-06-06 NOTE — Telephone Encounter (Signed)
Was able to talk to patient regarding patient's results.  They verbalized an understanding of what was discussed.  Patient states he is feeling better. And would come in around December 17th for another chest x-ray. Order placed   No further questions at this time.

## 2018-06-06 NOTE — Progress Notes (Signed)
Chest Xray showing a new atelectasis in Right lung base. Please follow up with the patient and assess symptoms. If patient symptoms are not improving or have worsened we may need to switch antibiotics.   If patient is improving then we will finish Zpak and get Cxray in 6 weeks.   Wyn Quaker FNP

## 2018-06-06 NOTE — Progress Notes (Signed)
Acute noted.  I am glad the patient is doing better.  Aaron Edelman

## 2018-06-08 ENCOUNTER — Other Ambulatory Visit: Payer: Self-pay | Admitting: Cardiology

## 2018-06-08 MED ORDER — SIMVASTATIN 20 MG PO TABS: 20.0000 mg | ORAL_TABLET | Freq: Every day | ORAL | 3 refills | Status: DC

## 2018-06-08 NOTE — Telephone Encounter (Signed)
 *  STAT* If patient is at the pharmacy, call can be transferred to refill team.   1. Which medications need to be refilled? (please list name of each medication and dose if known) simvastatin (ZOCOR) 20 MG tablet  2. Which pharmacy/location (including street and city if local pharmacy) is medication to be sent to? [facility]  3. Do they need a 30 day or 90 day supply? 90 days

## 2018-07-14 ENCOUNTER — Encounter: Payer: Self-pay | Admitting: Pulmonary Disease

## 2018-07-14 ENCOUNTER — Ambulatory Visit (INDEPENDENT_AMBULATORY_CARE_PROVIDER_SITE_OTHER): Payer: Medicare Other | Admitting: Pulmonary Disease

## 2018-07-14 ENCOUNTER — Telehealth: Payer: Self-pay | Admitting: *Deleted

## 2018-07-14 ENCOUNTER — Ambulatory Visit (INDEPENDENT_AMBULATORY_CARE_PROVIDER_SITE_OTHER)
Admission: RE | Admit: 2018-07-14 | Discharge: 2018-07-14 | Disposition: A | Discharge status: Home / Self Care | Payer: Medicare Other | Source: Ambulatory Visit | Attending: Pulmonary Disease | Admitting: Pulmonary Disease

## 2018-07-14 ENCOUNTER — Telehealth: Payer: Self-pay

## 2018-07-14 VITALS — BP 124/66 | HR 90 | Temp 98.4°F | Ht 70.0 in | Wt 163.0 lb

## 2018-07-14 DIAGNOSIS — R05 Cough: Secondary | ICD-10-CM

## 2018-07-14 DIAGNOSIS — R059 Cough, unspecified: Secondary | ICD-10-CM

## 2018-07-14 DIAGNOSIS — J430 Unilateral pulmonary emphysema [MacLeod's syndrome]: Secondary | ICD-10-CM

## 2018-07-14 DIAGNOSIS — J209 Acute bronchitis, unspecified: Secondary | ICD-10-CM | POA: Insufficient documentation

## 2018-07-14 DIAGNOSIS — J440 Chronic obstructive pulmonary disease with acute lower respiratory infection: Secondary | ICD-10-CM | POA: Diagnosis not present

## 2018-07-14 DIAGNOSIS — R0602 Shortness of breath: Secondary | ICD-10-CM | POA: Diagnosis not present

## 2018-07-14 DIAGNOSIS — R918 Other nonspecific abnormal finding of lung field: Secondary | ICD-10-CM

## 2018-07-14 MED ORDER — PREDNISONE 10 MG PO TABS: ORAL_TABLET | ORAL | 0 refills | Status: DC

## 2018-07-14 MED ORDER — AZITHROMYCIN 250 MG PO TABS: ORAL_TABLET | ORAL | 0 refills | Status: DC

## 2018-07-14 NOTE — Progress Notes (Signed)
Chest x-ray results of come back still showing persisting opacity in right lower lobe.  Radiologist suggesting CT, I agree.  Will order CT without contrast for further evaluation.  Lauren please place the order for CT without contrast to be completed within the next couple of days to a week.  Wyn Quaker FNP

## 2018-07-14 NOTE — Telephone Encounter (Signed)
Called and spoke with patient. Let him know of results, and someone would be calling related to the CT.  Order placed  Nothing further needed  Will route to [facility] as fyi

## 2018-07-14 NOTE — Progress Notes (Signed)
@Patient  ID: [name], male    DOB: 27-Jun-1936, 82 y.o.   MRN: [id number]  Chief Complaint  Patient presents with  . Follow-up    Cough    Referring provider: [name], MD  HPI:  82 year old male former smoker followed in our office for COPD  PMH: A. fib (on Xarelto) Smoker/ Smoking History: Former smoker.  40 pack years. Maintenance: Air duo Pt of: Dr. Lake Bells  07/14/2018  - Visit   82 year old male patient completing acute visit with our office today.  Patient was last seen in our office 06/01/18 and was treated as a COPD exacerbation chest x-ray showed right middle and right lower lobe atelectasis questionable pneumonia.  Patient reports that after being treated with Z-Pak and prednisone his symptoms completely resolved and he felt much better.  Patient now reporting 2 to 2-1/2 weeks ago that cough and postnasal drip started to increase.  Patient has been adherent to taking his Allegra.  Cough is occasionally productive with yellow mucus but is difficult to bring up mucus per patient.  Patient has known sick contacts to wife who has similar symptoms he is currently on an antibiotic.    Tests:   PFT (04/2016)  FEV1  0.89  30%.   Chest imaging: CXR (01/2016)  Cardiomegaly.   April 2019 cardiology records reviewed where he was seen for A. fib, coronary disease and maintained on Xarelto and Cardura.   FENO:  Lab Results  Component Value Date   NITRICOXIDE 30 06/01/2018    PFT: PFT Results Latest Ref Rng & Units 04/26/2016  FVC-Pre L 2.20  FVC-Predicted Pre % 53  FVC-Post L 2.41  FVC-Predicted Post % 58  Pre FEV1/FVC % % 40  Post FEV1/FCV % % 37  FEV1-Pre L 0.89  FEV1-Predicted Pre % 30  FEV1-Post L 0.90  DLCO UNC% % 41  DLCO COR %Predicted % 53  TLC L 8.20  TLC % Predicted % 116  RV % Predicted % 191    Imaging: No results found.  Chart Review:    Specialty Problems      Pulmonary Problems   COPD (chronic obstructive pulmonary disease)  (HCC)   Cough   Acute bronchitis with COPD (HCC)      Allergies  Allergen Reactions  . Iodinated Diagnostic Agents Nausea And Vomiting and Rash    Immunization History  Administered Date(s) Administered  . Influenza Inj Mdck Quad Pf 04/20/2018  . Influenza Split 04/16/2016  . Influenza, High Dose Seasonal PF 05/16/2015  . Pneumococcal Conjugate-13 06/14/2014  . Zoster 06/07/2006    Past Medical History:  Diagnosis Date  . CAD (coronary artery disease)    Remote MI in 1980. CABG 1995. Stent to left main in 2004. Last cath in April of 2012. EF 50%; patent LIMA to DX/LAD with collateralization of the distal right, patent SVG to OM, occluded SVG to distal RCA, and patent stent to LAD  . History of heart attack 1980  . Hypercholesteremia   . Hypertension   . Increased glucose level   . Obesity     Tobacco History: Social History   Tobacco Use  Smoking Status Former Smoker  . Packs/day: 1.00  . Years: 40.00  . Pack years: 40.00  . Types: Cigarettes  . Last attempt to quit: 07/02/1994  . Years since quitting: 24.0  Smokeless Tobacco Never Used   Counseling given: Yes  Continue to not smoke  Outpatient Encounter Medications as of 07/14/2018  Medication Sig  .  Bioflavonoid Products (ESTER-C) 500-550 MG TABS Take 500 mg by mouth daily.  . chlorthalidone (HYGROTON) 25 MG tablet Take 1 tablet (25 mg total) by mouth at bedtime.  . fish oil-omega-3 fatty acids 1000 MG capsule Take by mouth daily.   . Fluticasone-Salmeterol (AIRDUO RESPICLICK 419/62) 229-79 MCG/ACT AEPB Inhale 1 puff into the lungs 2 (two) times daily.  . metoprolol succinate (TOPROL-XL) 50 MG 24 hr tablet Take 1 1/2 tablets by mouth daily at bedtime  . multivitamin-iron-minerals-folic acid (CENTRUM) chewable tablet Chew 1 tablet by mouth daily.  . nitroGLYCERIN (NITROSTAT) 0.4 MG SL tablet Place 1 tablet (0.4 mg total) under the tongue as needed. (Patient taking differently: Place 0.4 mg under the tongue  every 5 (five) minutes as needed for chest pain. )  . rivaroxaban (XARELTO) 20 MG TABS tablet Take 1 tablet (20 mg total) by mouth daily with supper.  . simvastatin (ZOCOR) 20 MG tablet Take 1 tablet (20 mg total) by mouth at bedtime.  . tamsulosin (FLOMAX) 0.4 MG CAPS capsule Take 0.4 mg by mouth daily after supper.  . VENTOLIN HFA 108 (90 Base) MCG/ACT inhaler Inhale 2 puffs into the lungs every 4 (four) hours as needed for wheezing or shortness of breath.  Marland Kitchen azithromycin (ZITHROMAX) 250 MG tablet 500mg  (two tablets) today, then 250mg  (1 tablet) for the next 4 days  . predniSONE (DELTASONE) 10 MG tablet 4 tabs for 2 days, then 3 tabs for 2 days, 2 tabs for 2 days, then 1 tab for 2 days, then stop   No facility-administered encounter medications on file as of 07/14/2018.      Review of Systems  Review of Systems  Constitutional: Positive for fatigue. Negative for activity change, chills, fever and unexpected weight change.  HENT: Positive for congestion and postnasal drip. Negative for rhinorrhea, sinus pressure, sinus pain, sneezing and sore throat.   Eyes: Negative.   Respiratory: Positive for cough (hard to bring up, yellow tinge), shortness of breath (with exertion ) and wheezing.   Cardiovascular: Negative for chest pain and palpitations.  Gastrointestinal: Negative for constipation, diarrhea, nausea and vomiting.  Endocrine: Negative.   Musculoskeletal: Negative.   Skin: Negative.   Neurological: Negative for dizziness and headaches.  Psychiatric/Behavioral: Negative.  Negative for dysphoric mood. The patient is not nervous/anxious.   All other systems reviewed and are negative.    Physical Exam  BP 124/66 (BP Location: Left Arm, Cuff Size: Normal)   Pulse 90   Temp 98.4 F (36.9 C) (Oral)   Ht 5\' 10"  (1.778 m)   Wt 163 lb (73.9 kg)   SpO2 96%   BMI 23.39 kg/m   Wt Readings from Last 5 Encounters:  07/14/18 163 lb (73.9 kg)  06/05/18 162 lb 9.6 oz (73.8 kg)    06/01/18 163 lb 6.4 oz (74.1 kg)  05/03/18 164 lb 9.6 oz (74.7 kg)  11/28/17 167 lb 9.6 oz (76 kg)    Physical Exam  Constitutional: He is oriented to person, place, and time and well-developed, well-nourished, and in no distress. No distress.  HENT:  Head: Normocephalic and atraumatic.  Right Ear: Hearing, tympanic membrane, external ear and ear canal normal.  Left Ear: Hearing, tympanic membrane, external ear and ear canal normal.  Nose: Mucosal edema present. Right sinus exhibits no maxillary sinus tenderness and no frontal sinus tenderness. Left sinus exhibits no maxillary sinus tenderness and no frontal sinus tenderness.  Mouth/Throat: Uvula is midline and oropharynx is clear and moist. No oropharyngeal exudate.  +  Postnasal drip  Eyes: Pupils are equal, round, and reactive to light.  Neck: Normal range of motion. Neck supple.  Cardiovascular: Normal rate, regular rhythm and normal heart sounds.  Pulmonary/Chest: Effort normal and breath sounds normal. No accessory muscle usage. No respiratory distress. He has no decreased breath sounds. He has no wheezes. He has no rhonchi. He has no rales.  Deep breath elicits wet sounding cough  Musculoskeletal: Normal range of motion.        General: No edema.  Lymphadenopathy:    He has no cervical adenopathy.  Neurological: He is alert and oriented to person, place, and time. Gait normal.  Skin: Skin is warm and dry. He is not diaphoretic. No erythema.  Psychiatric: Mood, memory, affect and judgment normal.  Nursing note and vitals reviewed.     Lab Results:  CBC    Component Value Date/Time   WBC 8.5 09/16/2017 1219   WBC 7.0 09/09/2017 0625   RBC 3.73 (L) 09/16/2017 1219   RBC 3.01 (L) 09/09/2017 0625   HGB 11.5 (L) 09/16/2017 1219   HCT 34.3 (L) 09/16/2017 1219   PLT 358 09/16/2017 1219   MCV 92 09/16/2017 1219   MCH 30.8 09/16/2017 1219   MCH 31.2 09/09/2017 0625   MCHC 33.5 09/16/2017 1219   MCHC 34.7 09/09/2017 0625    RDW 16.2 (H) 09/16/2017 1219   LYMPHSABS 0.7 09/16/2017 1219   MONOABS 0.5 09/04/2017 1047   EOSABS 0.1 09/16/2017 1219   BASOSABS 0.0 09/16/2017 1219    BMET    Component Value Date/Time   NA 135 09/09/2017 0625   K 4.1 09/09/2017 0625   CL 99 (L) 09/09/2017 0625   CO2 25 09/09/2017 0625   GLUCOSE 135 (H) 09/09/2017 0625   BUN 20 09/09/2017 0625   CREATININE 1.08 09/09/2017 0625   CREATININE 0.86 03/09/2016 0926   CALCIUM 8.3 (L) 09/09/2017 0625   GFRNONAA >60 09/09/2017 0625   GFRAA >60 09/09/2017 0625    BNP No results found for: BNP  ProBNP No results found for: PROBNP    Assessment & Plan:   Pleasant 82 year old male patient presenting today for acute visit.  Will treat patient has acute bronchitis.  This is probably driven by a viral etiology or allergies but due to length of symptoms as well as severity of cough will treat with antibiotics and prednisone.  Patient's lung sounds have improved since last office visit I do not hear crackles in the right lower base.  Will get follow-up chest x-ray today as patient was scheduled to get that on 07/18/2018 to follow previous chest x-ray.  Will treat patient with Z-Pak and short course of prednisone.  Patient can keep follow-up with Dr. Lake Bells in January/2020 or present to her office sooner if symptoms are not improving.  Consider pneumonia vaccine at next office visit if patient stable.  Acute bronchitis with COPD (HCC) Azithromycin 250mg  tablet  >>>Take 2 tablets (500mg  total) today, and then 1 tablet (250mg ) for the next four days  >>>take with food  >>>can also take probiotic and / or yogurt while on antibiotic   Prednisone 10mg  tablet  >>>4 tabs for 2 days, then 3 tabs for 2 days, 2 tabs for 2 days, then 1 tab for 2 days, then stop >>>take with food  >>>take in the morning   Chest Xray today  >>>go to [facility] office in the basement   Continue AirDuo inhaler  >>>rinse mouth after use   Hydrate well  Can  use  Mucinex DM for cough and nasal drainage for next 5 days   COPD (chronic obstructive pulmonary disease) (HCC) Continue AirDuo inhaler  >>>rinse mouth after use    Cough  Chest Xray today  >>>go to [facility] office in the basement    Hydrate well  Can use Mucinex DM for cough and nasal drainage for next 5 days      Lauraine Rinne, NP 07/14/2018

## 2018-07-14 NOTE — Assessment & Plan Note (Signed)
  Chest Xray today  >>>go to [facility] office in the basement    Hydrate well  Can use Mucinex DM for cough and nasal drainage for next 5 days

## 2018-07-14 NOTE — Telephone Encounter (Signed)
Okay noted.  Will have Lauren call the patient and place an order for a CT. Aaron Edelman

## 2018-07-14 NOTE — Telephone Encounter (Signed)
-----   Message from Lauraine Rinne, NP sent at 07/14/2018 11:39 AM EST ----- Chest x-ray results of come back still showing persisting opacity in right lower lobe.  Radiologist suggesting CT, I agree.  Will order CT without contrast for further evaluation.  Zaida Reiland please place the order for CT without contrast to be completed within the next couple of days to a week.  Wyn Quaker FNP

## 2018-07-14 NOTE — Telephone Encounter (Signed)
Call report from [facility] Radiology:  Opacity in the right costophrenic angle, new since March 2019 but improved since June 01, 2018. This could represent scar or atelectasis. Other etiologies are possible, however. Recommend CT imaging of the chest for further evaluation.  BM please advise.

## 2018-07-14 NOTE — Patient Instructions (Signed)
Azithromycin 250mg  tablet  >>>Take 2 tablets (500mg  total) today, and then 1 tablet (250mg ) for the next four days  >>>take with food  >>>can also take probiotic and / or yogurt while on antibiotic   Prednisone 10mg  tablet  >>>4 tabs for 2 days, then 3 tabs for 2 days, 2 tabs for 2 days, then 1 tab for 2 days, then stop >>>take with food  >>>take in the morning   Chest Xray today  >>>go to [facility] office in the basement   Continue AirDuo inhaler  >>>rinse mouth after use   Hydrate well  Can use Mucinex DM for cough and nasal drainage for next 5 days     It is flu season:   >>>Remember to be washing your hands regularly, using hand sanitizer, be careful to use around herself with has contact with people who are sick will increase her chances of getting sick yourself. >>> Best ways to protect herself from the flu: Receive the yearly flu vaccine, practice good hand hygiene washing with soap and also using hand sanitizer when available, eat a nutritious meals, get adequate rest, hydrate appropriately   Please contact the office if your symptoms worsen or you have concerns that you are not improving.   Thank you for choosing [facility] for your healthcare, and for allowing [place] to partner with you on your healthcare journey. I am thankful to be able to provide care to you today.   Larry Quaker FNP-C    Acute Bronchitis, Adult Acute bronchitis is sudden (acute) swelling of the air tubes (bronchi) in the lungs. Acute bronchitis causes these tubes to fill with mucus, which can make it hard to breathe. It can also cause coughing or wheezing. In adults, acute bronchitis usually goes away within 2 weeks. A cough caused by bronchitis may last up to 3 weeks. Smoking, allergies, and asthma can make the condition worse. Repeated episodes of bronchitis may cause further lung problems, such as chronic obstructive pulmonary disease (COPD). What are the causes? This condition can be caused by  germs and by substances that irritate the lungs, including:  Cold and flu viruses. This condition is most often caused by the same virus that causes a cold.  Bacteria.  Exposure to tobacco smoke, dust, fumes, and air pollution.  What increases the risk? This condition is more likely to develop in people who:  Have close contact with someone with acute bronchitis.  Are exposed to lung irritants, such as tobacco smoke, dust, fumes, and vapors.  Have a weak immune system.  Have a respiratory condition such as asthma.  What are the signs or symptoms? Symptoms of this condition include:  A cough.  Coughing up clear, yellow, or green mucus.  Wheezing.  Chest congestion.  Shortness of breath.  A fever.  Body aches.  Chills.  A sore throat.  How is this diagnosed? This condition is usually diagnosed with a physical exam. During the exam, your health care provider may order tests, such as chest X-rays, to rule out other conditions. He or she may also:  Test a sample of your mucus for bacterial infection.  Check the level of oxygen in your blood. This is done to check for pneumonia.  Do a chest X-ray or lung function testing to rule out pneumonia and other conditions.  Perform blood tests.  Your health care provider will also ask about your symptoms and medical history. How is this treated? Most cases of acute bronchitis clear up over  time without treatment. Your health care provider may recommend:  Drinking more fluids. Drinking more makes your mucus thinner, which may make it easier to breathe.  Taking a medicine for a fever or cough.  Taking an antibiotic medicine.  Using an inhaler to help improve shortness of breath and to control a cough.  Using a cool mist vaporizer or humidifier to make it easier to breathe.  Follow these instructions at home: Medicines  Take over-the-counter and prescription medicines only as told by your health care provider.  If  you were prescribed an antibiotic, take it as told by your health care provider. Do not stop taking the antibiotic even if you start to feel better. General instructions  Get plenty of rest.  Drink enough fluids to keep your urine clear or pale yellow.  Avoid smoking and secondhand smoke. Exposure to cigarette smoke or irritating chemicals will make bronchitis worse. If you smoke and you need help quitting, ask your health care provider. Quitting smoking will help your lungs heal faster.  Use an inhaler, cool mist vaporizer, or humidifier as told by your health care provider.  Keep all follow-up visits as told by your health care provider. This is important. How is this prevented? To lower your risk of getting this condition again:  Wash your hands often with soap and water. If soap and water are not available, use hand sanitizer.  Avoid contact with people who have cold symptoms.  Try not to touch your hands to your mouth, nose, or eyes.  Make sure to get the flu shot every year.  Contact a health care provider if:  Your symptoms do not improve in 2 weeks of treatment. Get help right away if:  You cough up blood.  You have chest pain.  You have severe shortness of breath.  You become dehydrated.  You faint or keep feeling like you are going to faint.  You keep vomiting.  You have a severe headache.  Your fever or chills gets worse. This information is not intended to replace advice given to you by your health care provider. Make sure you discuss any questions you have with your health care provider. Document Released: 08/26/2004 Document Revised: 02/11/2016 Document Reviewed: 01/07/2016 Elsevier Interactive Patient Education  [employer].

## 2018-07-14 NOTE — Assessment & Plan Note (Signed)
Continue AirDuo inhaler  >>>rinse mouth after use

## 2018-07-14 NOTE — Assessment & Plan Note (Signed)
Azithromycin 250mg  tablet  >>>Take 2 tablets (500mg  total) today, and then 1 tablet (250mg ) for the next four days  >>>take with food  >>>can also take probiotic and / or yogurt while on antibiotic   Prednisone 10mg  tablet  >>>4 tabs for 2 days, then 3 tabs for 2 days, 2 tabs for 2 days, then 1 tab for 2 days, then stop >>>take with food  >>>take in the morning   Chest Xray today  >>>go to [facility] office in the basement   Continue AirDuo inhaler  >>>rinse mouth after use   Hydrate well  Can use Mucinex DM for cough and nasal drainage for next 5 days

## 2018-07-16 NOTE — Progress Notes (Signed)
Reviewed, agree 

## 2018-07-21 ENCOUNTER — Ambulatory Visit (INDEPENDENT_AMBULATORY_CARE_PROVIDER_SITE_OTHER)
Admission: RE | Admit: 2018-07-21 | Discharge: 2018-07-21 | Disposition: A | Discharge status: Home / Self Care | Payer: Medicare Other | Source: Ambulatory Visit | Attending: Pulmonary Disease | Admitting: Pulmonary Disease

## 2018-07-21 ENCOUNTER — Other Ambulatory Visit: Payer: Self-pay

## 2018-07-21 DIAGNOSIS — R911 Solitary pulmonary nodule: Secondary | ICD-10-CM | POA: Diagnosis not present

## 2018-07-21 DIAGNOSIS — R918 Other nonspecific abnormal finding of lung field: Secondary | ICD-10-CM

## 2018-07-21 NOTE — Progress Notes (Signed)
CT results of come back showing emphysema and basilar lung scarring.  There is a 6 mm nodule in the right lower lobe.  We will follow this on imaging as this is quite small.  We will plan to do another CT in about 6 months to follow this nodule and to see if it is growing, or stable or resolved.  Keep follow-up with our office and we can go over this more in depth.  As of right now no changes to plan of care.  Wyn Quaker FNP

## 2018-08-03 DIAGNOSIS — E785 Hyperlipidemia, unspecified: Secondary | ICD-10-CM | POA: Diagnosis not present

## 2018-08-03 DIAGNOSIS — I10 Essential (primary) hypertension: Secondary | ICD-10-CM | POA: Diagnosis not present

## 2018-08-07 DIAGNOSIS — D692 Other nonthrombocytopenic purpura: Secondary | ICD-10-CM | POA: Diagnosis not present

## 2018-08-07 DIAGNOSIS — E790 Hyperuricemia without signs of inflammatory arthritis and tophaceous disease: Secondary | ICD-10-CM | POA: Diagnosis not present

## 2018-08-07 DIAGNOSIS — I10 Essential (primary) hypertension: Secondary | ICD-10-CM | POA: Diagnosis not present

## 2018-08-07 DIAGNOSIS — I739 Peripheral vascular disease, unspecified: Secondary | ICD-10-CM | POA: Diagnosis not present

## 2018-08-07 DIAGNOSIS — K5730 Diverticulosis of large intestine without perforation or abscess without bleeding: Secondary | ICD-10-CM | POA: Diagnosis not present

## 2018-08-07 DIAGNOSIS — Z0000 Encounter for general adult medical examination without abnormal findings: Secondary | ICD-10-CM | POA: Diagnosis not present

## 2018-08-07 DIAGNOSIS — E1121 Type 2 diabetes mellitus with diabetic nephropathy: Secondary | ICD-10-CM | POA: Diagnosis not present

## 2018-08-07 DIAGNOSIS — R238 Other skin changes: Secondary | ICD-10-CM | POA: Diagnosis not present

## 2018-08-07 DIAGNOSIS — I259 Chronic ischemic heart disease, unspecified: Secondary | ICD-10-CM | POA: Diagnosis not present

## 2018-08-07 DIAGNOSIS — N400 Enlarged prostate without lower urinary tract symptoms: Secondary | ICD-10-CM | POA: Diagnosis not present

## 2018-08-07 DIAGNOSIS — R634 Abnormal weight loss: Secondary | ICD-10-CM | POA: Diagnosis not present

## 2018-08-07 DIAGNOSIS — E785 Hyperlipidemia, unspecified: Secondary | ICD-10-CM | POA: Diagnosis not present

## 2018-08-16 ENCOUNTER — Encounter: Payer: Self-pay | Admitting: Pulmonary Disease

## 2018-08-16 ENCOUNTER — Ambulatory Visit (INDEPENDENT_AMBULATORY_CARE_PROVIDER_SITE_OTHER): Payer: Medicare Other | Admitting: Pulmonary Disease

## 2018-08-16 DIAGNOSIS — R918 Other nonspecific abnormal finding of lung field: Secondary | ICD-10-CM | POA: Diagnosis not present

## 2018-08-16 NOTE — Progress Notes (Signed)
Subjective:    Patient ID: Larry Briggs, male    DOB: 1936/07/27, 83 y.o.   MRN: [id number]  Synopsis: Former patient of Larry Briggs has COPD.  Patient has a 40PY smoking history, quit in 1996.  He also has a history of coronary artery disease and had a CABG in 1996.   HPI Chief Complaint  Patient presents with  . Follow-up    pt c/o stable sob with exertion, sometimes prod cough with clear mucus worse late in the afternoon.     Larry Briggs had a flareup of his COPD about 2 months ago and was seen by 1 of our nurse practitioners.  He was treated with azithromycin and prednisone and he says that he is feeling better but he continues to have some sinus congestion and cough.  He does not have problems with shortness of breath.  He has tried to stay physically active.  He continues using his air duo inhaler.   Past Medical History:  Diagnosis Date  . CAD (coronary artery disease)    Remote MI in 1980. CABG 1995. Stent to left main in 2004. Last cath in April of 2012. EF 50%; patent LIMA to DX/LAD with collateralization of the distal right, patent SVG to OM, occluded SVG to distal RCA, and patent stent to LAD  . History of heart attack 1980  . Hypercholesteremia   . Hypertension   . Increased glucose level   . Obesity       Review of Systems  Constitutional: Negative for chills, fatigue and fever.  HENT: Positive for postnasal drip, rhinorrhea and sinus pain.   Respiratory: Positive for shortness of breath. Negative for choking and stridor.   Cardiovascular: Negative for chest pain and leg swelling.       Objective:   Physical Exam  Vitals:   08/16/18 1334  BP: 134/62  Pulse: 60  SpO2: 99%  Weight: 162 lb 6.4 oz (73.7 kg)  Height: 5\' 10"  (1.778 m)    Gen: well appearing HENT: OP clear, TM's clear, neck supple PULM: CTA B, normal percussion CV: RRR, no mgr, trace edema GI: BS+, soft, nontender Derm: no cyanosis or rash Psyche: normal mood and affect   CBC      Component Value Date/Time   WBC 8.5 09/16/2017 1219   WBC 7.0 09/09/2017 0625   RBC 3.73 (L) 09/16/2017 1219   RBC 3.01 (L) 09/09/2017 0625   HGB 11.5 (L) 09/16/2017 1219   HCT 34.3 (L) 09/16/2017 1219   PLT 358 09/16/2017 1219   MCV 92 09/16/2017 1219   MCH 30.8 09/16/2017 1219   MCH 31.2 09/09/2017 0625   MCHC 33.5 09/16/2017 1219   MCHC 34.7 09/09/2017 0625   RDW 16.2 (H) 09/16/2017 1219   LYMPHSABS 0.7 09/16/2017 1219   MONOABS 0.5 09/04/2017 1047   EOSABS 0.1 09/16/2017 1219   BASOSABS 0.0 09/16/2017 1219     PFT: PFT (04/2016)  FEV1  0.89  30%.   Chest imaging: CXR (01/2016)  Cardiomegaly.   April 2019 cardiology records reviewed where he was seen for A. fib, coronary disease and maintained on Xarelto and Cardura.     Assessment & Plan:    Abnormal findings on diagnostic imaging of lung - Plan: CT Chest Wo Contrast  Discussion: Aside from the recent flareup of COPD this is been a stable interval for him.  We talked about the importance of staying physically active.  Plan: COPD: Continue using AirDuo twice a day  no matter how you feel Use albuterol as needed for chest tightness wheezing or shortness of breath It is important to remain physically active Practice good hand hygiene  Pulmonary nodule: This is a very small abnormality seen on the CT scan of your chest and is unlikely to be anything worrisome however as we discussed today in clinic we would like to follow this with serial CT scans to make sure that it does not increase in size The next CT will be arranged for May 2020  Follow up in 6 months or sooner if needed  Current Outpatient Medications:  .  Bioflavonoid Products (ESTER-C) 500-550 MG TABS, Take 500 mg by mouth daily., Disp: , Rfl:  .  chlorthalidone (HYGROTON) 25 MG tablet, Take 1 tablet (25 mg total) by mouth at bedtime., Disp: 90 tablet, Rfl: 3 .  fish oil-omega-3 fatty acids 1000 MG capsule, Take by mouth daily. , Disp: , Rfl:  .   Fluticasone-Salmeterol (AIRDUO RESPICLICK 211/15) 520-80 MCG/ACT AEPB, Inhale 1 puff into the lungs 2 (two) times daily., Disp: 3 each, Rfl: 0 .  metoprolol succinate (TOPROL-XL) 50 MG 24 hr tablet, Take 1 1/2 tablets by mouth daily at bedtime, Disp: 135 tablet, Rfl: 3 .  multivitamin-iron-minerals-folic acid (CENTRUM) chewable tablet, Chew 1 tablet by mouth daily., Disp: , Rfl:  .  nitroGLYCERIN (NITROSTAT) 0.4 MG SL tablet, Place 1 tablet (0.4 mg total) under the tongue as needed. (Patient taking differently: Place 0.4 mg under the tongue every 5 (five) minutes as needed for chest pain. ), Disp: 25 tablet, Rfl: 3 .  rivaroxaban (XARELTO) 20 MG TABS tablet, Take 1 tablet (20 mg total) by mouth daily with supper., Disp: 30 tablet, Rfl: 11 .  simvastatin (ZOCOR) 20 MG tablet, Take 1 tablet (20 mg total) by mouth at bedtime., Disp: 90 tablet, Rfl: 3 .  tamsulosin (FLOMAX) 0.4 MG CAPS capsule, Take 0.4 mg by mouth daily after supper., Disp: , Rfl:  .  VENTOLIN HFA 108 (90 Base) MCG/ACT inhaler, Inhale 2 puffs into the lungs every 4 (four) hours as needed for wheezing or shortness of breath., Disp: 1 Inhaler, Rfl: 3

## 2018-08-16 NOTE — Patient Instructions (Signed)
COPD: Continue using AirDuo twice a day no matter how you feel Use albuterol as needed for chest tightness wheezing or shortness of breath It is important to remain physically active Practice good hand hygiene  Pulmonary nodule: This is a very small abnormality seen on the CT scan of your chest and is unlikely to be anything worrisome however as we discussed today in clinic we would like to follow this with serial CT scans to make sure that it does not increase in size The next CT will be arranged for May 2020  Follow up in 6 months or sooner if needed

## 2018-08-28 DIAGNOSIS — I10 Essential (primary) hypertension: Secondary | ICD-10-CM | POA: Diagnosis not present

## 2018-08-28 DIAGNOSIS — I4891 Unspecified atrial fibrillation: Secondary | ICD-10-CM | POA: Diagnosis not present

## 2018-08-28 DIAGNOSIS — E1121 Type 2 diabetes mellitus with diabetic nephropathy: Secondary | ICD-10-CM | POA: Diagnosis not present

## 2018-08-28 DIAGNOSIS — Z7901 Long term (current) use of anticoagulants: Secondary | ICD-10-CM | POA: Diagnosis not present

## 2018-09-04 ENCOUNTER — Telehealth: Payer: Self-pay | Admitting: Cardiology

## 2018-09-04 NOTE — Telephone Encounter (Signed)
Samples placed up front.  Patient notified.

## 2018-09-04 NOTE — Telephone Encounter (Signed)
Patient calling the office for samples of medication: ° ° °1.  What medication and dosage are you requesting samples for? rivaroxaban (XARELTO) 20 MG TABS tablet ° °2.  Are you currently out of this medication? yes ° ° °

## 2018-09-05 ENCOUNTER — Telehealth: Payer: Self-pay

## 2018-09-05 NOTE — Telephone Encounter (Signed)
Pt's Wife called stating that he can't afford his Xarelto. They are requesting samples. Please return pt's call.

## 2018-09-05 NOTE — Telephone Encounter (Signed)
New Message   Patient states do not call anything in to [facility].

## 2018-09-06 NOTE — Telephone Encounter (Signed)
Samples provided to patient on 09/04/18. Wife picked them up on 09/05/18.

## 2018-10-03 ENCOUNTER — Telehealth: Payer: Self-pay

## 2018-10-03 ENCOUNTER — Other Ambulatory Visit: Payer: Self-pay | Admitting: Cardiology

## 2018-10-03 NOTE — Telephone Encounter (Signed)
Called pt and left a msg stating that they need to have cmp drawn in order to grant the refill request

## 2018-10-03 NOTE — Telephone Encounter (Signed)
Called pt to let them know that they need a cmp drawn for the xarelto request and to call the office back so that we can get this scheduled

## 2018-10-03 NOTE — Telephone Encounter (Signed)
New Message    *STAT* If patient is at the pharmacy, call can be transferred to refill team.   1. Which medications need to be refilled? (please list name of each medication and dose if known) Xarelto  20mg   2. Which pharmacy/location (including street and city if local pharmacy) is medication to be sent to? [facility]  3. Do they need a 30 day or 90 day supply? 90 day

## 2018-11-02 ENCOUNTER — Telehealth: Payer: Self-pay | Admitting: Pulmonary Disease

## 2018-11-02 NOTE — Telephone Encounter (Signed)
Called and spoke with pt's wife Manuela Schwartz stating to her that we could push pt's CT out until July and that we would have OV done after CT. Manuela Schwartz expressed understanding. Recall is in place in pt's chart and also made a comment on the CT order for scan to be scheduled to be done in July. Nothing further needed.

## 2018-11-02 NOTE — Telephone Encounter (Signed)
Spoke with pt wife (DPR) and she was wondering if pt should keep his appt for CT scan in May or if he could push it out to July when his 6 month f/u should be.  Aaron Edelman, please advise.

## 2018-11-02 NOTE — Telephone Encounter (Signed)
Definitely okay to push back CT.  Can push back for 3 to 4 months so can aim to have this completed in July or August 2020.Wyn Quaker, FNP

## 2018-11-09 ENCOUNTER — Telehealth: Payer: Self-pay | Admitting: Pulmonary Disease

## 2018-11-09 MED ORDER — FLUTICASONE-SALMETEROL 232-14 MCG/ACT IN AEPB: 1.0000 | INHALATION_SPRAY | Freq: Two times a day (BID) | RESPIRATORY_TRACT | 3 refills | Status: DC

## 2018-11-09 NOTE — Telephone Encounter (Signed)
Called and spoke with pt. Pt was needing refill of fluticasone-salmeterol to be sent to pharmacy for him. I verified pt's preferred pharmacy and sent refill in for pt. Nothing further needed.

## 2018-11-09 NOTE — Telephone Encounter (Signed)
Called and spoke with pt. Pt stated to send fluticasone-salmeterol Rx to [facility] for him since his local pharmacy did not have the med avail. Med has been sent to [facility] for pt. Nothing further needed.

## 2018-11-27 ENCOUNTER — Telehealth: Payer: Self-pay | Admitting: Pulmonary Disease

## 2018-11-27 NOTE — Telephone Encounter (Signed)
Unfortunately this doesn't sound like something that will likely respond to antibiotics. Recommend respiratory sputum sample to make sure. Continue mucinex twice a day and delsym cough syrup. Recommend flonase nasal spray and we could try adding Singulair which can treat allergies and inflammation.

## 2018-11-27 NOTE — Telephone Encounter (Signed)
Left message for Susan to call back

## 2018-11-27 NOTE — Telephone Encounter (Signed)
Primary Pulmonologist: BQ Last office visit and with whom: 08/16/18 w/BQ What do we see them for (pulmonary problems): COPD Last OV assessment/plan: COPD: Continue using AirDuo twice a day no matter how you feel Use albuterol as needed for chest tightness wheezing or shortness of breath It is important to remain physically active Practice good hand hygiene  Pulmonary nodule: This is a very small abnormality seen on the CT scan of your chest and is unlikely to be anything worrisome however as we discussed today in clinic we would like to follow this with serial CT scans to make sure that it does not increase in size The next CT will be arranged for May 2020  Follow up in 6 months or sooner if needed  Was appointment offered to patient (explain)?  No, due to [place]   Reason for call: Spoke with patient's wife Manuela Schwartz. She stated that the patient has continued to have a cough. This cough has been going on since his last appt with BQ back in January 2020. It is a productive cough with clear phlegm. Denies having any SOB, fever. He has been taking Mucinex with no relief. His cough is so strong at times that he starts to gag. No recent travel nor been around anyone with COVID19.   She is requesting to have something called in for him.   Pharmacy [facility] on [address].   Beth, please advise BQ is not in the office. Thanks!

## 2018-11-28 NOTE — Telephone Encounter (Signed)
Pt's wife Manuela Schwartz returned call. I stated to her the info from [facility] and she stated she would have pt try the OTC meds to see if it would help. Nothing further needed.

## 2018-12-12 ENCOUNTER — Telehealth: Payer: Medicare Other | Admitting: Cardiology

## 2018-12-12 ENCOUNTER — Other Ambulatory Visit: Payer: Self-pay | Admitting: Cardiology

## 2018-12-12 ENCOUNTER — Telehealth: Payer: Self-pay | Admitting: *Deleted

## 2018-12-12 MED ORDER — METOPROLOL SUCCINATE ER 50 MG PO TB24: ORAL_TABLET | ORAL | 1 refills | Status: DC

## 2018-12-12 NOTE — Telephone Encounter (Signed)
A message was left,re: follow up appointment with the PA.

## 2018-12-12 NOTE — Telephone Encounter (Signed)
New Message     *STAT* If patient is at the pharmacy, call can be transferred to refill team.   1. Which medications need to be refilled? (please list name of each medication and dose if known) Metoprolol 50 mg   2. Which pharmacy/location (including street and city if local pharmacy) is medication to be sent to? [facility]   3. Do they need a 30 day or 90 day supply? 90 day

## 2018-12-26 ENCOUNTER — Telehealth: Payer: Self-pay | Admitting: Physician Assistant

## 2018-12-26 NOTE — Telephone Encounter (Signed)
Can call Home phone or smartphone/ my chart active/ consent/ pre reg completed

## 2018-12-27 ENCOUNTER — Telehealth (INDEPENDENT_AMBULATORY_CARE_PROVIDER_SITE_OTHER): Payer: Medicare Other | Admitting: Physician Assistant

## 2018-12-27 VITALS — BP 139/78 | HR 70 | Ht 70.0 in | Wt 165.0 lb

## 2018-12-27 DIAGNOSIS — J449 Chronic obstructive pulmonary disease, unspecified: Secondary | ICD-10-CM

## 2018-12-27 DIAGNOSIS — I480 Paroxysmal atrial fibrillation: Secondary | ICD-10-CM | POA: Diagnosis not present

## 2018-12-27 DIAGNOSIS — I10 Essential (primary) hypertension: Secondary | ICD-10-CM

## 2018-12-27 DIAGNOSIS — I25810 Atherosclerosis of coronary artery bypass graft(s) without angina pectoris: Secondary | ICD-10-CM

## 2018-12-27 DIAGNOSIS — E785 Hyperlipidemia, unspecified: Secondary | ICD-10-CM

## 2018-12-27 NOTE — Progress Notes (Signed)
Virtual Visit via Telephone Note   This visit type was conducted due to national recommendations for restrictions regarding the COVID-19 Pandemic (e.g. social distancing) in an effort to limit this patient's exposure and mitigate transmission in our community.  Due to his co-morbid illnesses, this patient is at least at moderate risk for complications without adequate follow up.  This format is felt to be most appropriate for this patient at this time.  The patient did not have access to video technology/had technical difficulties with video requiring transitioning to audio format only (telephone).  All issues noted in this document were discussed and addressed.  No physical exam could be performed with this format.  Please refer to the patient's chart for his  consent to telehealth for [facility].   Date:  12/27/2018   ID:  Larry Briggs, DOB [birth year], MRN [id number]  Patient Location: Home Provider Location: Home  PCP:  [name], MD  Cardiologist:  Peter Martinique, MD  Electrophysiologist:  None   Evaluation Performed:  Follow-Up Visit  Chief Complaint:  followup  History of Present Illness:    Larry Briggs is a 83 y.o. male with PMH of HTN, HLD, severe COPD, atrial fibrillation, and CAD s/p CABG 1995.  He underwent stenting of his left main in 2004.  Last cardiac catheterization in April 2012 showed patent stents and grafts except for SVG to distal RCA.  PDA was supplied by left-to-right collaterals.  He had side effect on blood pressure medication which improved after discontinuation of lisinopril and doxazosin and initiation of valsartan.  His valsartan was later switched to irbesartan, this was later discontinued as well.  Patient had a hip fracture require intramedullary nail in February 2019.  EKG at the time demonstrated new T wave inversion in the anterior lateral leads in the setting of tachycardia and anemia.  He did not have any symptoms.  Echocardiogram obtained  in February 2019 showed EF 50 to 55%, grade 1 DD, trivial pericardial effusion.  Suspicion for angina was low at the time.  He went into atrial fibrillation with RVR which was new and was not placed on systemic anticoagulation therapy due to significant bleeding risk.  He subsequently converted to sinus rhythm.  A 30-day monitor was obtained to assess whether he has any further recurrence of atrial fibrillation.  This test did demonstrate a paroxysmal atrial fibrillation, therefore he was later started on Xarelto.  His aspirin was discontinued.  Patient was contacted today via telephone visit.  He denies any recent chest pain, shortness of breath or significant lower extremity edema.  He does not have any bleeding issues on the Xarelto.  Overall, he is doing quite well on the current medication.  The patient does not have symptoms concerning for COVID-19 infection (fever, chills, cough, or new shortness of breath).    Past Medical History:  Diagnosis Date  . CAD (coronary artery disease)    Remote MI in 1980. CABG 1995. Stent to left main in 2004. Last cath in April of 2012. EF 50%; patent LIMA to DX/LAD with collateralization of the distal right, patent SVG to OM, occluded SVG to distal RCA, and patent stent to LAD  . History of heart attack 1980  . Hypercholesteremia   . Hypertension   . Increased glucose level   . Obesity    Past Surgical History:  Procedure Laterality Date  . CARDIAC CATHETERIZATION  1993  . CARDIAC CATHETERIZATION  April 2012   Mild reduction if EF  at 50%. Patent LIMA to DX/LAD with collateralization to distal RCA, patent SVG to OM and occluded SVG to distal right and patent stent to LAD/Left main;  . CORONARY ARTERY BYPASS GRAFT  05/1994   x5 LIMA to LAD & DX, SVG to LCX, SVG to RCA  . CORONARY STENT PLACEMENT  2004   Stent to the L main  . INTRAMEDULLARY (IM) NAIL INTERTROCHANTERIC Left 09/05/2017   Procedure: INTRAMEDULLARY (IM) NAIL INTERTROCHANTRIC;  Surgeon:  Renette Butters, MD;  Location: [facility];  Service: Orthopedics;  Laterality: Left;     Current Meds  Medication Sig  . Bioflavonoid Products (ESTER-C) 500-550 MG TABS Take 500 mg by mouth daily.  . chlorthalidone (HYGROTON) 25 MG tablet Take 1 tablet (25 mg total) by mouth at bedtime.  . fish oil-omega-3 fatty acids 1000 MG capsule Take by mouth daily.   . Fluticasone-Salmeterol (AIRDUO RESPICLICK 007/62) 263-33 MCG/ACT AEPB Inhale 1 puff into the lungs 2 (two) times daily.  . metoprolol succinate (TOPROL-XL) 50 MG 24 hr tablet Take 1 1/2 tablets by mouth daily at bedtime  . multivitamin-iron-minerals-folic acid (CENTRUM) chewable tablet Chew 1 tablet by mouth daily.  . nitroGLYCERIN (NITROSTAT) 0.4 MG SL tablet Place 1 tablet (0.4 mg total) under the tongue as needed. (Patient taking differently: Place 0.4 mg under the tongue every 5 (five) minutes as needed for chest pain. )  . rivaroxaban (XARELTO) 20 MG TABS tablet Take 1 tablet (20 mg total) by mouth daily with supper.  . simvastatin (ZOCOR) 20 MG tablet Take 1 tablet (20 mg total) by mouth at bedtime.  . tamsulosin (FLOMAX) 0.4 MG CAPS capsule Take 0.4 mg by mouth daily after supper.  . VENTOLIN HFA 108 (90 Base) MCG/ACT inhaler Inhale 2 puffs into the lungs every 4 (four) hours as needed for wheezing or shortness of breath.     Allergies:   Iodinated diagnostic agents   Social History   Tobacco Use  . Smoking status: Former Smoker    Packs/day: 1.00    Years: 40.00    Pack years: 40.00    Types: Cigarettes    Last attempt to quit: 07/02/1994    Years since quitting: 24.5  . Smokeless tobacco: Never Used  Substance Use Topics  . Alcohol use: No  . Drug use: No     Family Hx: The patient's family history includes Heart attack in his father; Hypertension in his father; Stroke in his mother.  ROS:   Please see the history of present illness.     All other systems reviewed and are negative.   Prior CV studies:   The  following studies were reviewed today:  Echo 09/07/2017 LV EF: 50% -   55%  ------------------------------------------------------------------- Study Conclusions  - Left ventricle: The cavity size was normal. Wall thickness was   increased in a pattern of mild LVH. Systolic function was normal.   The estimated ejection fraction was in the range of 50% to 55%.   Doppler parameters are consistent with abnormal left ventricular   relaxation (grade 1 diastolic dysfunction). - Aortic valve: Mildly calcified annulus. - Right atrium: The atrium was mildly dilated. - Pericardium, extracardiac: A trivial pericardial effusion was   identified.  Impressions:  - Technically difficult echo   Image quality is poor.  Labs/Other Tests and Data Reviewed:    EKG:  An ECG dated 09/16/2017 was personally reviewed today and demonstrated:  Normal sinus rhythm with nonspecific T wave changes  Recent Labs: No results  found for requested labs within last 8760 hours.   Recent Lipid Panel Lab Results  Component Value Date/Time   CHOL  11/14/2010 05:00 AM    112        ATP III CLASSIFICATION:  <200     mg/dL   Desirable  200-239  mg/dL   Borderline High  >=240    mg/dL   High          TRIG 108 11/14/2010 05:00 AM   HDL 40 11/14/2010 05:00 AM   CHOLHDL 2.8 11/14/2010 05:00 AM   LDLCALC  11/14/2010 05:00 AM    50        Total Cholesterol/HDL:CHD Risk Coronary Heart Disease Risk Table                     Men   Women  1/2 Average Risk   3.4   3.3  Average Risk       5.0   4.4  2 X Average Risk   9.6   7.1  3 X Average Risk  23.4   11.0        Use the calculated Patient Ratio above and the CHD Risk Table to determine the patient's CHD Risk.        ATP III CLASSIFICATION (LDL):  <100     mg/dL   Optimal  100-129  mg/dL   Near or Above                    Optimal  130-159  mg/dL   Borderline  160-189  mg/dL   High  >190     mg/dL   Very High    Wt Readings from Last 3 Encounters:   12/27/18 170 lb (77.1 kg)  08/16/18 162 lb 6.4 oz (73.7 kg)  07/14/18 163 lb (73.9 kg)     Objective:    Vital Signs:  BP 139/78   Pulse 70   Ht 5\' 10"  (1.778 m)   Wt 170 lb (77.1 kg)   SpO2 96%   BMI 24.39 kg/m    VITAL SIGNS:  reviewed  ASSESSMENT & PLAN:    1. Paroxysmal atrial fibrillation: On Xarelto and metoprolol.  Heart rate well controlled.  2. CAD status post CABG 1999: Denies any recent anginal symptoms.  Not on aspirin given the need for Xarelto.  3. Hypertension: Blood pressure well controlled on current therapy  4. Hyperlipidemia: On Zocor 20 mg daily.  Request last fasting lipid panel and LFT from PCPs office  5. COPD: No recent exacerbation  COVID-19 Education: The signs and symptoms of COVID-19 were discussed with the patient and how to seek care for testing (follow up with PCP or arrange E-visit).  The importance of social distancing was discussed today.  Time:   Today, I have spent 10 minutes with the patient with telehealth technology discussing the above problems.     Medication Adjustments/Labs and Tests Ordered: Current medicines are reviewed at length with the patient today.  Concerns regarding medicines are outlined above.   Tests Ordered: No orders of the defined types were placed in this encounter.   Medication Changes: No orders of the defined types were placed in this encounter.   Disposition:  Follow up in 6 month(s)  Signed, Almyra Deforest, PA  12/27/2018 10:00 AM    [facility]

## 2018-12-27 NOTE — Patient Instructions (Signed)
Medication Instructions:   Your physician recommends that you continue on your current medications as directed. Please refer to the Current Medication list given to you today.  If you need a refill on your cardiac medications before your next appointment, please call your pharmacy.   Lab work:  NONE ordered at this time of appointment   If you have labs (blood work) drawn today and your tests are completely normal, you will receive your results only by: Marland Kitchen MyChart Message (if you have MyChart) OR . A paper copy in the mail If you have any lab test that is abnormal or we need to change your treatment, we will call you to review the results.  Testing/Procedures:  NONE ordered at this time of appointment    Follow-Up: At [facility], you and your health needs are our priority.  As part of our continuing mission to provide you with exceptional heart care, we have created designated Provider Care Teams.  These Care Teams include your primary Cardiologist (physician) and Advanced Practice Providers (APPs -  Physician Assistants and Nurse Practitioners) who all work together to provide you with the care you need, when you need it. . You will need a follow up appointment in 6 months (NOV 2020).  Please call our office in AUG/SEPT 2020 to schedule this appointment.  You may see Peter Martinique, MD   Any Other Special Instructions Will Be Listed Below (If Applicable).

## 2019-01-01 ENCOUNTER — Other Ambulatory Visit: Payer: Self-pay | Admitting: Cardiology

## 2019-01-01 NOTE — Telephone Encounter (Signed)
Pt is a 83 yr old male who had a telemed appt on 12/27/18 with PA. Last noted weight was 73.7Kg on 08/16/18. SCr on 08/03/18 was 1.04. CrCl is 59 mL/min. Will refill Xarelto 20mg  QD.

## 2019-01-18 DIAGNOSIS — H2513 Age-related nuclear cataract, bilateral: Secondary | ICD-10-CM | POA: Diagnosis not present

## 2019-01-18 DIAGNOSIS — H04123 Dry eye syndrome of bilateral lacrimal glands: Secondary | ICD-10-CM | POA: Diagnosis not present

## 2019-01-18 DIAGNOSIS — H524 Presbyopia: Secondary | ICD-10-CM | POA: Diagnosis not present

## 2019-02-07 ENCOUNTER — Encounter

## 2019-02-20 ENCOUNTER — Ambulatory Visit (INDEPENDENT_AMBULATORY_CARE_PROVIDER_SITE_OTHER)
Admission: RE | Admit: 2019-02-20 | Discharge: 2019-02-20 | Disposition: A | Discharge status: Home / Self Care | Payer: Medicare Other | Source: Ambulatory Visit | Attending: Pulmonary Disease | Admitting: Pulmonary Disease

## 2019-02-20 ENCOUNTER — Other Ambulatory Visit: Payer: Self-pay

## 2019-02-20 DIAGNOSIS — R911 Solitary pulmonary nodule: Secondary | ICD-10-CM | POA: Diagnosis not present

## 2019-02-20 DIAGNOSIS — H2513 Age-related nuclear cataract, bilateral: Secondary | ICD-10-CM | POA: Diagnosis not present

## 2019-02-21 NOTE — Progress Notes (Signed)
CT results have come back.  Showing a stable 6 mm nodule.  This is good news.  The 6 mm nodule is unchanged from the December/2019 CT.  Radiologist is recommending 1 repeat CT in 18 months.  Please go ahead and place order for CT without contrast to be completed in 18 months from July/2020.  Wyn Quaker, FNP

## 2019-02-26 NOTE — Progress Notes (Signed)
See result note.   B

## 2019-02-28 ENCOUNTER — Other Ambulatory Visit: Payer: Self-pay

## 2019-02-28 DIAGNOSIS — R911 Solitary pulmonary nodule: Secondary | ICD-10-CM

## 2019-02-28 NOTE — Progress Notes (Signed)
Order for Chest CT placed. Nothing further is needed at this time.

## 2019-03-02 ENCOUNTER — Ambulatory Visit (INDEPENDENT_AMBULATORY_CARE_PROVIDER_SITE_OTHER): Payer: Medicare Other | Admitting: Pulmonary Disease

## 2019-03-02 ENCOUNTER — Telehealth: Payer: Self-pay | Admitting: Pulmonary Disease

## 2019-03-02 ENCOUNTER — Other Ambulatory Visit: Payer: Self-pay

## 2019-03-02 ENCOUNTER — Encounter: Payer: Self-pay | Admitting: Pulmonary Disease

## 2019-03-02 DIAGNOSIS — R059 Cough, unspecified: Secondary | ICD-10-CM

## 2019-03-02 DIAGNOSIS — J430 Unilateral pulmonary emphysema [MacLeod's syndrome]: Secondary | ICD-10-CM | POA: Diagnosis not present

## 2019-03-02 DIAGNOSIS — R05 Cough: Secondary | ICD-10-CM | POA: Diagnosis not present

## 2019-03-02 DIAGNOSIS — J440 Chronic obstructive pulmonary disease with acute lower respiratory infection: Secondary | ICD-10-CM

## 2019-03-02 DIAGNOSIS — J209 Acute bronchitis, unspecified: Secondary | ICD-10-CM | POA: Diagnosis not present

## 2019-03-02 DIAGNOSIS — R918 Other nonspecific abnormal finding of lung field: Secondary | ICD-10-CM

## 2019-03-02 MED ORDER — AZITHROMYCIN 250 MG PO TABS: ORAL_TABLET | ORAL | 0 refills | Status: DC

## 2019-03-02 MED ORDER — PREDNISONE 10 MG PO TABS: ORAL_TABLET | ORAL | 0 refills | Status: DC

## 2019-03-02 NOTE — Assessment & Plan Note (Signed)
Plan: Repeat CT of chest in 18 months

## 2019-03-02 NOTE — Assessment & Plan Note (Signed)
Plan: Continue air duo inhaler Follow-up with our office in 2 months

## 2019-03-02 NOTE — Assessment & Plan Note (Signed)
Plan: Z-Pak today Steroids today Continue air duo Start daily antihistamine Could add Flonase/fluticasone nasal spray

## 2019-03-02 NOTE — Assessment & Plan Note (Signed)
Plan: Z-Pak today Steroid taper today Continue air duo inhaler Hydrate well Start taking daily antihistamine Can consider Flonase/fluticasone nasal spray

## 2019-03-02 NOTE — Progress Notes (Signed)
Virtual Visit via Telephone Note  I connected with Larry Briggs on 03/02/19 at 11:30 AM EDT by telephone and verified that I am speaking with the correct person using two identifiers.  Location: Patient: Home Provider: [facility] - [address], [place], [place],  [address]   I discussed the limitations, risks, security and privacy concerns of performing an evaluation and management service by telephone and the availability of in person appointments. I also discussed with the patient that there may be a patient responsible charge related to this service. The patient expressed understanding and agreed to proceed.  Patient consented to consult via telephone: Yes People present and their role in pt care: Pt    History of Present Illness: 83 year old male former smoker followed in our office for COPD and pulmonary nodule  PMH: A. fib (on Xarelto) Smoker/ Smoking History: Former smoker.  40 pack years. Maintenance: Air duo Pt of: Dr. Lake Bells  Chief complaint: Cough    83 year old male former smoker followed in our office for COPD.  Patient spouse contacted our office this morning to report that he has had increased wheezing, shortness of breath as well as increased postnasal drip.  The patient is semi-poor historian over the phone today and says that he is having a range of symptoms but the cough persist.  He does admit that he is having clear sputum.  He debates whether or not he is having consistent wheezing but spouse believes that he is.  He does admit that he has had increased shortness of breath.  He does have postnasal drip.  He is not on any sort of daily antihistamine.  He does not use Flonase.  Patient reports that he feels like how he felt back in December/2019 when he was treated with antibiotics and steroids from our office.  Patient cannot remember if this helped with his treatment.  But January/2020 office note of Dr. Lake Bells says the patient was at a  stable interval.  Patient is also being followed in our office for history of a right lower lobe 6 mm pulmonary nodule.  This is stable on CT of patient's chest in July/2020.  Will repeat in 18 months.  Observations/Objective:  PFT (04/2016)  FEV1  0.89  30%.   Chest imaging: CXR (01/2016)  Cardiomegaly.   02/20/2019-CT chest without contrast- no acute cardiopulmonary disease, stable 6 mm nodule over the right lower lobe adjacent to the diaphragm, recommend 1 additional CT follow-up in 18 months to document 2 years of stability  April 2019 cardiology records reviewed where he was seen for A. fib, coronary disease and maintained on Xarelto and Cardura.  Assessment and Plan:  Acute bronchitis with COPD ([place]) Plan: Z-Pak today Steroid taper today Continue air duo inhaler Hydrate well Start taking daily antihistamine Can consider Flonase/fluticasone nasal spray  COPD (chronic obstructive pulmonary disease) ([place]) Plan: Continue air duo inhaler Follow-up with our office in 2 months  Abnormal findings on diagnostic imaging of lung Plan: Repeat CT of chest in 18 months   Cough Plan: Z-Pak today Steroids today Continue air duo Start daily antihistamine Could add Flonase/fluticasone nasal spray    Follow Up Instructions:  Return in about 2 months (around 05/02/2019), or if symptoms worsen or fail to improve, for Follow up with Dr. Lake Bells, Follow up with Wyn Quaker FNP-C.   I discussed the assessment and treatment plan with the patient. The patient was provided an opportunity to ask questions and all were answered.  The patient agreed with the plan and demonstrated an understanding of the instructions.   The patient was advised to call back or seek an in-person evaluation if the symptoms worsen or if the condition fails to improve as anticipated.  I provided 23 minutes of non-face-to-face time during this encounter.   Lauraine Rinne, NP

## 2019-03-02 NOTE — Telephone Encounter (Signed)
Spoke with the pt's spouse  She states that pt has continued to have the same cough ever since the last visit her  He is coughing up clear sputum and having wheezing, SOB and PND  She states no fever, chills, diarrhea, joint pain, muscle pain, loss of taste/smell  She states that he has not had any sick contacts but [place] stayed at a condo and stayed secluded  They have mychart and I offered mychart video visit but pt prefers televisit  Appt scheduled

## 2019-03-02 NOTE — Patient Instructions (Addendum)
Azithromycin 250mg  tablet  >>>Take 2 tablets (500mg  total) today, and then 1 tablet (250mg ) for the next four days  >>>take with food  >>>can also take probiotic and / or yogurt while on antibiotic   Prednisone 10mg  tablet  >>>4 tabs for 2 days, then 3 tabs for 2 days, 2 tabs for 2 days, then 1 tab for 2 days, then stop >>>take with food  >>>take in the morning   Continue AirDuo inhaler  >>>rinse mouth after use     Please start taking a daily antihistamine:  >>>choose one of: zyrtec, claritin, allegra, or xyzal  >>>these are over the counter medications  >>>can choose generic option  >>>take daily  >>>this medication helps with allergies, post nasal drip, and cough         Return in about 2 months (around 05/02/2019), or if symptoms worsen or fail to improve, for Follow up with Dr. Lake Bells, Follow up with Wyn Quaker FNP-C.   Coronavirus (COVID-19) Are you at risk?  Are you at risk for the Coronavirus (COVID-19)?  To be considered HIGH RISK for Coronavirus (COVID-19), you have to meet the following criteria:  . Traveled to [place], [place], [place], [place] or [place]; or in the [place] to [place], [place], [place], or [place]; and have fever, cough, and shortness of breath within the last 2 weeks of travel OR . Been in close contact with a person diagnosed with COVID-19 within the last 2 weeks and have fever, cough, and shortness of breath . IF YOU DO NOT MEET THESE CRITERIA, YOU ARE CONSIDERED LOW RISK FOR COVID-19.  What to do if you are HIGH RISK for COVID-19?  Marland Kitchen If you are having a medical emergency, call 911. . Seek medical care right away. Before you go to a doctor's office, urgent care or emergency department, call ahead and tell them about your recent travel, contact with someone diagnosed with COVID-19, and your symptoms. You should receive instructions from your physician's office regarding next steps of care.  . When you arrive at healthcare  provider, tell the healthcare staff immediately you have returned from visiting [place], [place], [place], [place] or [place]; or traveled in the [place] to [place], [place], [place], or [place]; in the last two weeks or you have been in close contact with a person diagnosed with COVID-19 in the last 2 weeks.   . Tell the health care staff about your symptoms: fever, cough and shortness of breath. . After you have been seen by a medical provider, you will be either: o Tested for (COVID-19) and discharged home on quarantine except to seek medical care if symptoms worsen, and asked to  - Stay home and avoid contact with others until you get your results (4-5 days)  - Avoid travel on public transportation if possible (such as bus, train, or airplane) or o Sent to the Emergency Department by EMS for evaluation, COVID-19 testing, and possible admission depending on your condition and test results.  What to do if you are LOW RISK for COVID-19?  Reduce your risk of any infection by using the same precautions used for avoiding the common cold or flu:  Marland Kitchen Wash your hands often with soap and warm water for at least 20 seconds.  If soap and water are not readily available, use an alcohol-based hand sanitizer with at least 60% alcohol.  . If coughing or sneezing, cover your mouth and nose by coughing or sneezing into the  elbow areas of your shirt or coat, into a tissue or into your sleeve (not your hands). . Avoid shaking hands with others and consider head nods or verbal greetings only. . Avoid touching your eyes, nose, or mouth with unwashed hands.  . Avoid close contact with people who are sick. . Avoid places or events with large numbers of people in one location, like concerts or sporting events. . Carefully consider travel plans you have or are making. . If you are planning any travel outside or inside the [place], visit the CDC's Travelers' Health webpage for the latest health notices. . If you  have some symptoms but not all symptoms, continue to monitor at home and seek medical attention if your symptoms worsen. . If you are having a medical emergency, call 911.   [place] / e-Visit: [email or web address]         [facility]: [facility]: [phone]                   [facility]: [phone]           It is flu season:   >>> Best ways to protect herself from the flu: Receive the yearly flu vaccine, practice good hand hygiene washing with soap and also using hand sanitizer when available, eat a nutritious meals, get adequate rest, hydrate appropriately   Please contact the office if your symptoms worsen or you have concerns that you are not improving.   Thank you for choosing [facility] for your healthcare, and for allowing [place] to partner with you on your healthcare journey. I am thankful to be able to provide care to you today.   Wyn Quaker FNP-C

## 2019-03-03 NOTE — Progress Notes (Signed)
Reviewed, agree 

## 2019-04-10 ENCOUNTER — Telehealth: Payer: Self-pay | Admitting: Cardiology

## 2019-04-10 NOTE — Telephone Encounter (Signed)
Patient calling the office for samples of medication:   1.  What medication and dosage are you requesting samples for?   XARELTO 20 MG TABS tablet     2.  Are you currently out of this medication? Has a few days left.

## 2019-04-11 NOTE — Telephone Encounter (Signed)
SPOKE TO PATIENT - NO  SAMPLES  AVAILABLE  AWARE TO CALL BACK -- PATIENT DID NOT WANT A CALL  IN PRESCRIPTION

## 2019-04-24 ENCOUNTER — Other Ambulatory Visit: Payer: Self-pay | Admitting: Cardiology

## 2019-04-24 NOTE — Telephone Encounter (Signed)
° °*  STAT* If patient is at the pharmacy, call can be transferred to refill team.   1. Which medications need to be refilled? (please list name of each medication and dose if known)  XARELTO 20 MG TABS tablet  2. Which pharmacy/location (including street and city if local pharmacy) is medication to be sent to? [place], [place] - [id number]  3. Do they need a 30 day or 90 day supply? Early called on behalf of the patient. The patient is out of his medication

## 2019-04-25 MED ORDER — RIVAROXABAN 20 MG PO TABS: ORAL_TABLET | ORAL | 1 refills | Status: DC

## 2019-04-25 NOTE — Telephone Encounter (Signed)
6m 73.7kg Scr 1.04 08/03/18 Lovw/meng 12/27/18 ccr 59.3mlmin

## 2019-05-11 DIAGNOSIS — Z23 Encounter for immunization: Secondary | ICD-10-CM | POA: Diagnosis not present

## 2019-05-28 IMAGING — DX DG HIP (WITH OR WITHOUT PELVIS) 2-3V*L*
3 series · 3 of 3 positions shown · non-contrast
Comparison: None.

CLINICAL DATA: Left hip pain post fall.

EXAM:
DG HIP (WITH OR WITHOUT PELVIS) 2-3V LEFT

[t pelvis ap]
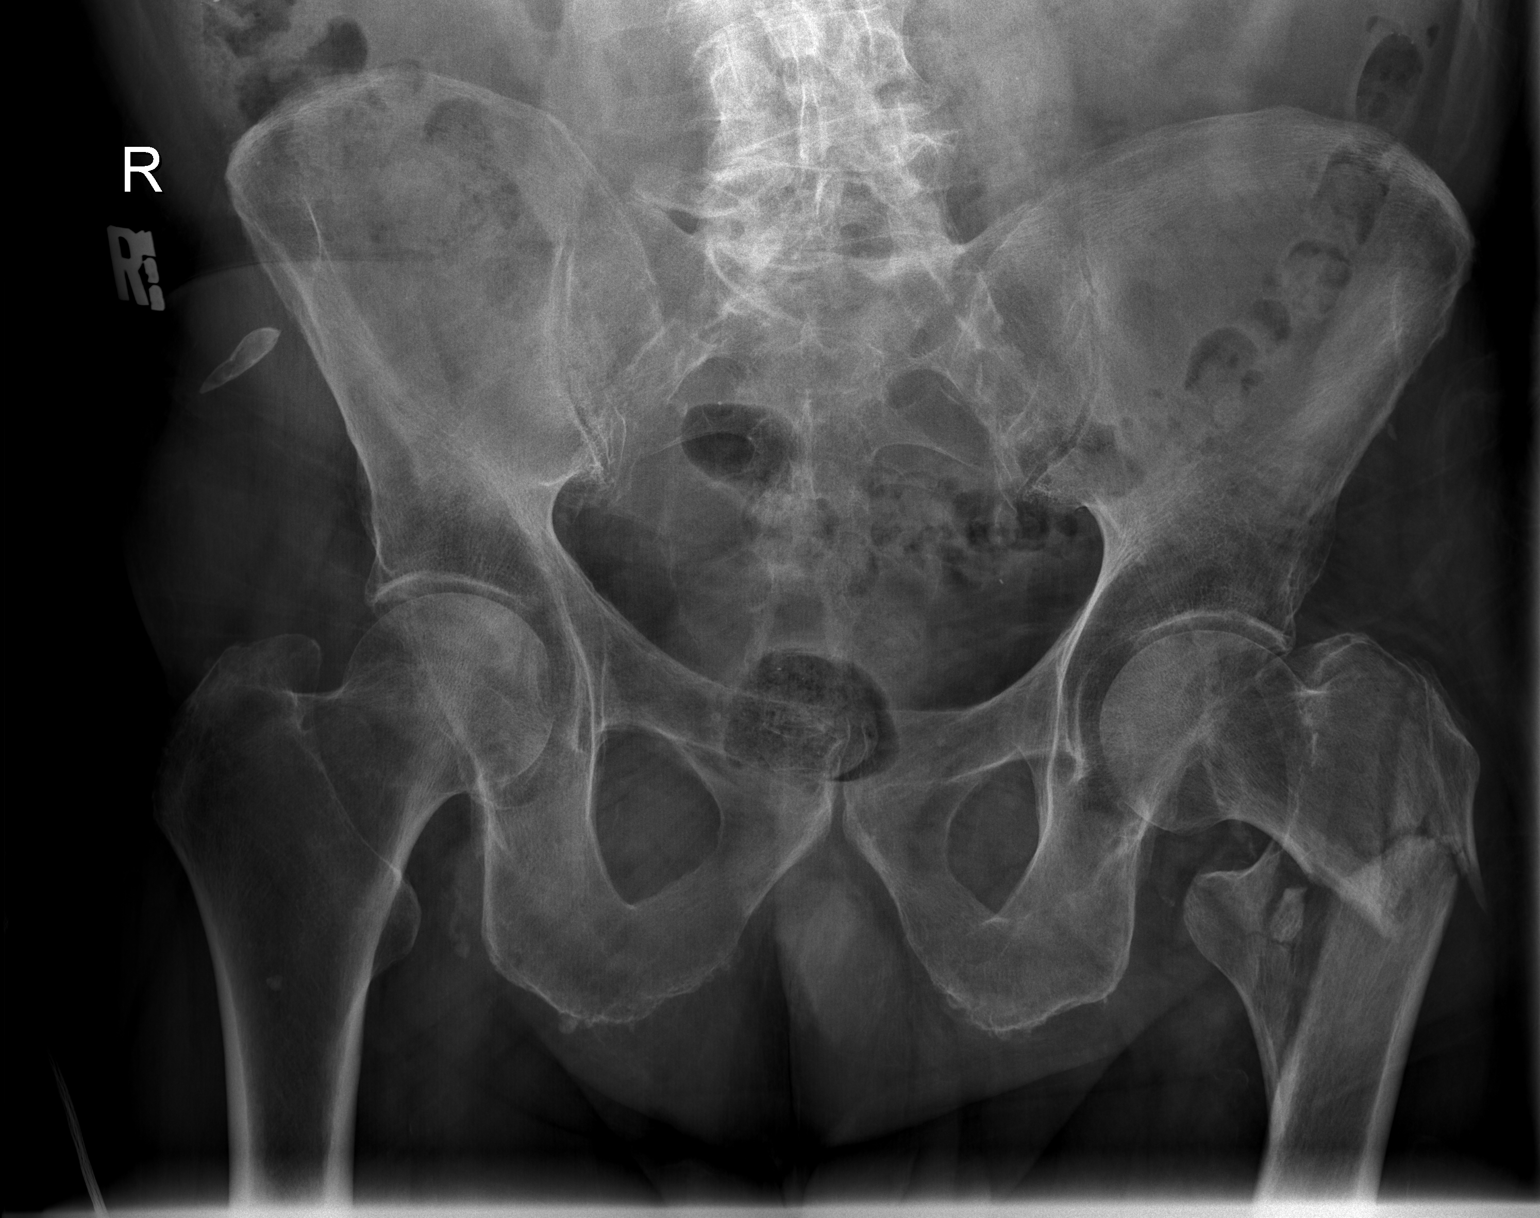

[t hip ap left]
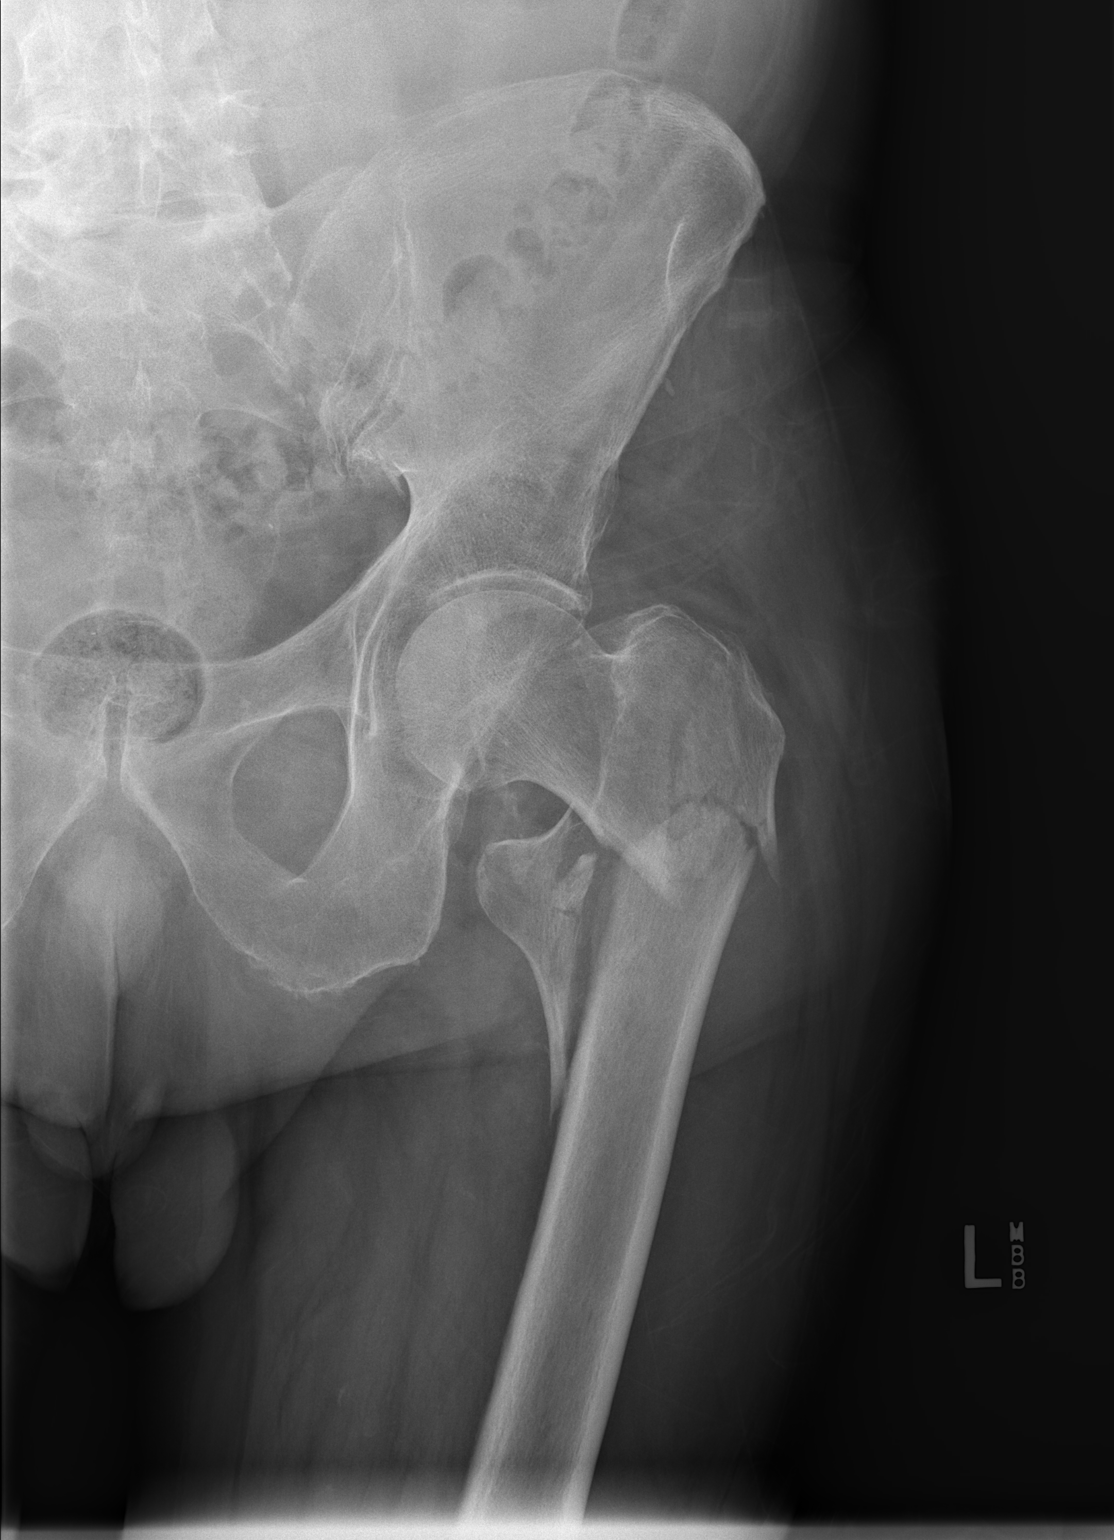

[w hip lat left]
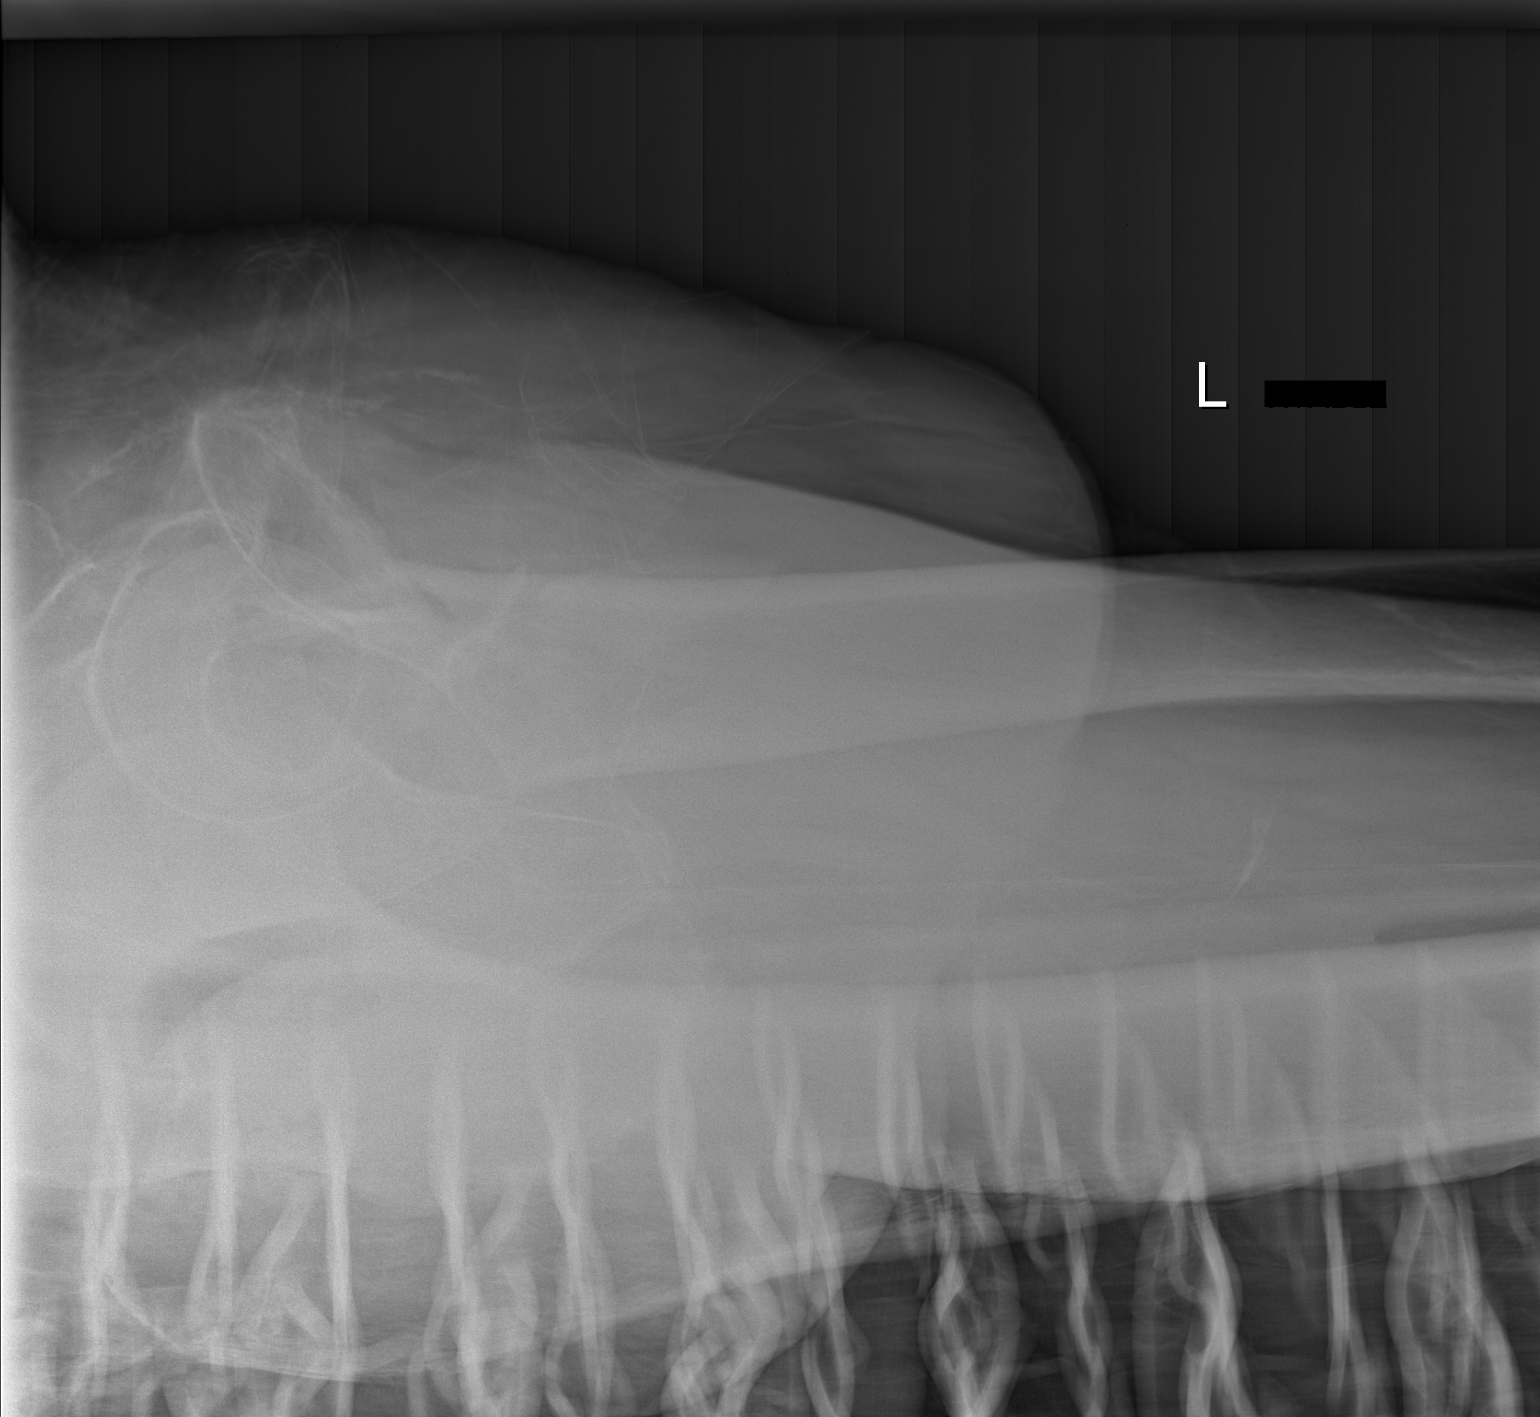

[3 of 3 positions shown; findings below may reference images not displayed]

FINDINGS: There is severely comminuted oblique intertrochanteric fracture of
the left femur with superior dislocation of the distal fracture
fragment and butterfly fragment containing the lesser trochanter.
The glenohumeral joint is preserved.
IMPRESSION: Severely comminuted oblique intertrochanteric fracture of the left
femur with superior dislocation of the distal fracture fragment and
butterfly fragment containing the lesser trochanter.

## 2019-06-04 ENCOUNTER — Other Ambulatory Visit: Payer: Self-pay | Admitting: Cardiology

## 2019-06-11 ENCOUNTER — Telehealth: Payer: Self-pay | Admitting: Pulmonary Disease

## 2019-06-11 ENCOUNTER — Other Ambulatory Visit: Payer: Self-pay | Admitting: Cardiology

## 2019-06-11 MED ORDER — VENTOLIN HFA 108 (90 BASE) MCG/ACT IN AERS: 2.0000 | INHALATION_SPRAY | RESPIRATORY_TRACT | 5 refills | Status: DC | PRN

## 2019-06-11 NOTE — Telephone Encounter (Signed)
Spoke with pt's spouse, states that pt needs Ventolin refill.  This has been sent to preferred pharmacy.  Nothing further needed at this time- will close encounter.

## 2019-06-13 ENCOUNTER — Telehealth: Payer: Self-pay | Admitting: Cardiology

## 2019-06-13 NOTE — Telephone Encounter (Signed)
Spoke with pt and should have gotten Simvastatin 20 mg by now via mail and pt aware no longer can get samples as we have generic Pt decided will wait and see if med comes in mail tom and if not will call back for [place] to call in to local drugstore./cy

## 2019-06-13 NOTE — Telephone Encounter (Signed)
New Message      Pts wife is calling and asks if we have any Samples of Simvastatin 20mg   If we do not have samples, pts wife asks for 1-2 pills be sent to the [facility] [address] pharmacy because he is out and has not yet received his refill by mail

## 2019-06-14 ENCOUNTER — Other Ambulatory Visit: Payer: Self-pay | Admitting: Cardiology

## 2019-06-14 MED ORDER — SIMVASTATIN 20 MG PO TABS: 20.0000 mg | ORAL_TABLET | Freq: Every day | ORAL | 0 refills | Status: DC

## 2019-06-14 MED ORDER — CHLORTHALIDONE 25 MG PO TABS: 25.0000 mg | ORAL_TABLET | Freq: Every day | ORAL | 0 refills | Status: DC

## 2019-06-14 NOTE — Telephone Encounter (Signed)
Sent RX to pharmacy 

## 2019-06-14 NOTE — Telephone Encounter (Signed)
°*  STAT* If patient is at the pharmacy, call can be transferred to refill team.   1. Which medications need to be refilled? (please list name of each medication and dose if known) simvastatin (ZOCOR) 20 MG tablet chlorthalidone (HYGROTON) 25 MG tablet  2. Which pharmacy/location (including street and city if local pharmacy) is medication to be sent to? [place], [place] - [phone]  3. Do they need a 30 day or 90 day supply? Patient needs a short supply. His mail order has not come in yet.  Insurance company said to call us and request this.

## 2019-07-03 ENCOUNTER — Telehealth: Payer: Self-pay | Admitting: Pulmonary Disease

## 2019-07-03 ENCOUNTER — Encounter: Payer: Self-pay | Admitting: Pulmonary Disease

## 2019-07-03 ENCOUNTER — Ambulatory Visit (INDEPENDENT_AMBULATORY_CARE_PROVIDER_SITE_OTHER): Payer: Medicare Other | Admitting: Pulmonary Disease

## 2019-07-03 ENCOUNTER — Other Ambulatory Visit: Payer: Self-pay

## 2019-07-03 DIAGNOSIS — J440 Chronic obstructive pulmonary disease with acute lower respiratory infection: Secondary | ICD-10-CM

## 2019-07-03 DIAGNOSIS — J430 Unilateral pulmonary emphysema [MacLeod's syndrome]: Secondary | ICD-10-CM | POA: Diagnosis not present

## 2019-07-03 DIAGNOSIS — Z79899 Other long term (current) drug therapy: Secondary | ICD-10-CM | POA: Diagnosis not present

## 2019-07-03 DIAGNOSIS — R918 Other nonspecific abnormal finding of lung field: Secondary | ICD-10-CM | POA: Diagnosis not present

## 2019-07-03 DIAGNOSIS — J209 Acute bronchitis, unspecified: Secondary | ICD-10-CM

## 2019-07-03 MED ORDER — DOXYCYCLINE HYCLATE 100 MG PO TABS: 100.0000 mg | ORAL_TABLET | Freq: Two times a day (BID) | ORAL | 0 refills | Status: DC

## 2019-07-03 MED ORDER — PREDNISONE 10 MG PO TABS: ORAL_TABLET | ORAL | 0 refills | Status: DC

## 2019-07-03 NOTE — Assessment & Plan Note (Signed)
Plan: Could consider trial of trelegy Ellipta inhaler or a LAMA LABA inhaler in the future Patient reports high cost of air duo as well as Xarelto medications Referral to teaching to help with management of COPD as well as medication access

## 2019-07-03 NOTE — Assessment & Plan Note (Signed)
Plan: Prednisone taper today Doxycycline today Patient could consider trial of Trelegy Ellipta in the future based off of exacerbations, patient with low peripheral eosinophil counts also may benefit from LAMA/LABA inhaler Follow-up in 4 to 8 weeks to establish care with either Dr. Valeta Harms or Dr. Tamala Julian

## 2019-07-03 NOTE — Patient Instructions (Addendum)
You were seen today by Lauraine Rinne, NP  for:   1. Acute bronchitis with COPD ([place])  - doxycycline (VIBRA-TABS) 100 MG tablet; Take 1 tablet (100 mg total) by mouth 2 (two) times daily.  Dispense: 14 tablet; Refill: 0 - predniSONE (DELTASONE) 10 MG tablet; 4 tabs for 2 days, then 3 tabs for 2 days, 2 tabs for 2 days, then 1 tab for 2 days, then stop  Dispense: 20 tablet; Refill: 0 - AMB Referral to [facility]  I have also placed an order for you to have a sputum sample tested >>> AFB, fungal, respiratory sputum  Please produce the sputum culture in the morning before you have eaten, bring this to our office within 3 hours of producing it.  2. COPD/ emphysema ([place])  - AMB Referral to [facility]  Continue air duo inhaler  Note your daily symptoms > remember "red flags" for COPD:   >>>Increase in cough >>>increase in sputum production >>>increase in shortness of breath or activity  intolerance.   If you notice these symptoms, please call the office to be seen.    3. Abnormal findings on diagnostic imaging of lung  We will plan to repeat a CT of your chest in December/2021 to follow the 6 mm right lower lobe nodule  4. Medication management  - AMB Referral to [facility]  I have referred [facility] for help with your cost of your medication specifically Xarelto as well as your inhalers  We can also consider trialing you on a new inhaler at your next office visit  We recommend today:  Orders Placed This Encounter  Procedures  . AMB Referral to [facility]    Referral Priority:   Routine    Referral Type:   Consultation    Referral Reason:   [facility]    Number of Visits Requested:   1   Orders Placed This Encounter  Procedures  . AMB Referral to [facility]   Meds ordered this encounter  Medications  . doxycycline (VIBRA-TABS) 100 MG tablet    Sig: Take 1 tablet (100 mg total) by mouth 2 (two)  times daily.    Dispense:  14 tablet    Refill:  0  . predniSONE (DELTASONE) 10 MG tablet    Sig: 4 tabs for 2 days, then 3 tabs for 2 days, 2 tabs for 2 days, then 1 tab for 2 days, then stop    Dispense:  20 tablet    Refill:  0    Follow Up:    Return in about 4 weeks (around 07/31/2019), or if symptoms worsen or fail to improve, for Follow up with Dr. Valeta Harms, Follow up with Dr. Tamala Briggs - 65min ov former BQ pt .   Please do your part to reduce the spread of COVID-19:      Reduce your risk of any infection  and COVID19 by using the similar precautions used for avoiding the common cold or flu:  Marland Kitchen Wash your hands often with soap and warm water for at least 20 seconds.  If soap and water are not readily available, use an alcohol-based hand sanitizer with at least 60% alcohol.  . If coughing or sneezing, cover your mouth and nose by coughing or sneezing into the elbow areas of your shirt or coat, into a tissue or into your sleeve (not your hands). Langley Gauss A MASK when in public  . Avoid shaking  hands with others and consider head nods or verbal greetings only. . Avoid touching your eyes, nose, or mouth with unwashed hands.  . Avoid close contact with people who are sick. . Avoid places or events with large numbers of people in one location, like concerts or sporting events. . If you have some symptoms but not all symptoms, continue to monitor at home and seek medical attention if your symptoms worsen. . If you are having a medical emergency, call 911.   [place] / e-Visit: [email or web address]         [facility]: [facility]: [id number]                   [facility]: [id number]     It is flu season:   >>> Best ways to protect herself from the flu: Receive the yearly flu vaccine, practice good hand hygiene washing with soap  and also using hand sanitizer when available, eat a nutritious meals, get adequate rest, hydrate appropriately   Please contact the office if your symptoms worsen or you have concerns that you are not improving.   Thank you for choosing [facility] for your healthcare, and for allowing [place] to partner with you on your healthcare journey. I am thankful to be able to provide care to you today.   Wyn Quaker FNP-C

## 2019-07-03 NOTE — Assessment & Plan Note (Addendum)
82mm RLL nodule stable  Plan: Repeat CT of chest in 18 months - Dec/2021

## 2019-07-03 NOTE — Assessment & Plan Note (Addendum)
Plan: Doxycycline today Prednisone taper Sputum culture: Fungal, AFB, respiratory sputum

## 2019-07-03 NOTE — Progress Notes (Signed)
Virtual Visit via Telephone Note  I connected with Larry Briggs on 07/03/19 at  3:00 PM EST by telephone and verified that I am speaking with the correct person using two identifiers.  Location: Patient: Home Provider: [facility] - [id number] [place], [place], [place] [address]   I discussed the limitations, risks, security and privacy concerns of performing an evaluation and management service by telephone and the availability of in person appointments. I also discussed with the patient that there may be a patient responsible charge related to this service. The patient expressed understanding and agreed to proceed.  Patient consented to consult via telephone: Yes People present and their role in pt care: Pt   History of Present Illness:  83 year old male former smoker followed in our office for COPD and pulmonary nodule  PMH: A. fib (on Xarelto) Smoker/ Smoking History: Former smoker.  40 pack years. Maintenance: Air duo Pt of: Dr. Lake Bells  Chief complaint: Provider Switch Albin Fischer with congestion  83 year old male former smoker followed in our office for COPD.  Patient was previously followed by Dr. Lake Bells.  Patient would like to establish with a new pulmonologist as Dr. Lake Bells will no longer be seeing patients in our clinic.  Patient reports that he typically has a baseline cough with clear sputum.  Unfortunately over the last 2 weeks this is increased as well as sputum color change to dark yellow to green.  Patient also has occasional wheezing and the wheezing is also worse at night.  He continues to be maintained on his air duo inhaler.   Patient is interested in [employer] as he has seen a commercial from this before.  Observations/Objective:  PFT (04/2016)  FEV1  0.89  30%.   Chest imaging: CXR (01/2016)  Cardiomegaly.   02/20/2019-CT chest without contrast- no acute cardiopulmonary disease, stable 6 mm nodule over the right lower lobe adjacent  to the diaphragm, recommend 1 additional CT follow-up in 18 months to document 2 years of stability  April 2019 cardiology records reviewed where he was seen for A. fib, coronary disease and maintained on Xarelto and Cardura.  Social History   Tobacco Use  Smoking Status Former Smoker  . Packs/day: 1.00  . Years: 40.00  . Pack years: 40.00  . Types: Cigarettes  . Quit date: 07/02/1994  . Years since quitting: 25.0  Smokeless Tobacco Never Used   Immunization History  Administered Date(s) Administered  . Influenza Inj Mdck Quad Pf 04/20/2018  . Influenza Split 04/16/2016  . Influenza, High Dose Seasonal PF 05/16/2015  . Pneumococcal Conjugate-13 06/14/2014  . Zoster 06/07/2006      Assessment and Plan:  Abnormal findings on diagnostic imaging of lung 60mm RLL nodule stable  Plan: Repeat CT of chest in 18 months - Dec/2021   COPD (chronic obstructive pulmonary disease) (H[facility]) Plan: Prednisone taper today Doxycycline today Patient could consider trial of Trelegy Ellipta in the future based off of exacerbations, patient with low peripheral eosinophil counts also may benefit from LAMA/LABA inhaler Follow-up in 4 to 8 weeks to establish care with either Dr. Valeta Harms or Dr. Tamala Julian  Acute bronchitis with COPD [facility]) Plan: Doxycycline today Prednisone taper Sputum culture: Fungal, AFB, respiratory sputum   Medication management Plan: Could consider trial of trelegy Ellipta inhaler or a LAMA LABA inhaler in the future Patient reports high cost of air duo as well as Xarelto medications Referral to teaching to help with management of COPD as well as medication  access   Follow Up Instructions:  Return in about 4 weeks (around 07/31/2019), or if symptoms worsen or fail to improve, for Follow up with Dr. Valeta Harms, Follow up with Dr. Tamala Julian.   I discussed the assessment and treatment plan with the patient. The patient was provided an opportunity to ask questions and all were  answered. The patient agreed with the plan and demonstrated an understanding of the instructions.   The patient was advised to call back or seek an in-person evaluation if the symptoms worsen or if the condition fails to improve as anticipated.  I provided 24 minutes of non-face-to-face time during this encounter.   Lauraine Rinne, NP

## 2019-07-03 NOTE — Telephone Encounter (Signed)
Pt is scheduled for a mychart video visit today at 3pm with Aaron Edelman due to having acute symptoms of cough with discolored mucus and not sleeping.  Called and spoke with [name] stating to her that we should keep his current appt with Aaron Edelman and then after that, we could get him established with a new pulmonologist. Larry Briggs verbalized understanding. Nothing further needed.

## 2019-07-05 ENCOUNTER — Other Ambulatory Visit: Payer: Medicare Other

## 2019-07-05 DIAGNOSIS — J440 Chronic obstructive pulmonary disease with acute lower respiratory infection: Secondary | ICD-10-CM

## 2019-07-05 DIAGNOSIS — J430 Unilateral pulmonary emphysema [MacLeod's syndrome]: Secondary | ICD-10-CM

## 2019-07-06 ENCOUNTER — Other Ambulatory Visit: Payer: Self-pay | Admitting: *Deleted

## 2019-07-06 ENCOUNTER — Encounter: Payer: Self-pay | Admitting: Internal Medicine

## 2019-07-06 ENCOUNTER — Encounter: Payer: Self-pay | Admitting: *Deleted

## 2019-07-06 ENCOUNTER — Other Ambulatory Visit: Payer: Self-pay

## 2019-07-06 ENCOUNTER — Telehealth: Payer: Self-pay | Admitting: Internal Medicine

## 2019-07-06 ENCOUNTER — Ambulatory Visit (INDEPENDENT_AMBULATORY_CARE_PROVIDER_SITE_OTHER): Payer: Medicare Other | Admitting: Internal Medicine

## 2019-07-06 VITALS — BP 130/80 | HR 75 | Temp 98.3°F | Resp 18 | Ht 70.0 in | Wt 162.8 lb

## 2019-07-06 DIAGNOSIS — R911 Solitary pulmonary nodule: Secondary | ICD-10-CM | POA: Diagnosis not present

## 2019-07-06 DIAGNOSIS — Z599 Problem related to housing and economic circumstances, unspecified: Secondary | ICD-10-CM

## 2019-07-06 DIAGNOSIS — Z598 Other problems related to housing and economic circumstances: Secondary | ICD-10-CM

## 2019-07-06 DIAGNOSIS — J441 Chronic obstructive pulmonary disease with (acute) exacerbation: Secondary | ICD-10-CM

## 2019-07-06 MED ORDER — TRELEGY ELLIPTA 100-62.5-25 MCG/INH IN AEPB: 1.0000 | INHALATION_SPRAY | Freq: Every day | RESPIRATORY_TRACT | 3 refills | Status: DC

## 2019-07-06 MED ORDER — MONTELUKAST SODIUM 10 MG PO TABS: 10.0000 mg | ORAL_TABLET | Freq: Every day | ORAL | 0 refills | Status: DC

## 2019-07-06 MED ORDER — AZITHROMYCIN 500 MG PO TABS: 500.0000 mg | ORAL_TABLET | ORAL | 3 refills | Status: AC

## 2019-07-06 MED ORDER — OMEPRAZOLE 20 MG PO CPDR: 20.0000 mg | DELAYED_RELEASE_CAPSULE | Freq: Every day | ORAL | 0 refills | Status: DC

## 2019-07-06 NOTE — Patient Outreach (Signed)
Telephone outreach for referral from pulmonology. Pt needs pharmacy assistance and COPD disease management.  Talked with Mr. Larry Briggs and he explains that his doctor just prescribed Treligy which costs $150 a month. He states he can afford it for this month for a trial but it will be a stretch for them to pay for it on an on going basis. He also takes Xarelto which is expensive.  We discussed his COPD disease management practices which he does take his meds everyday. He knows when to take his rescue inhaler and to call the MD. He is unfamiliar with the COPD ACTION PLAN. He agrees to work with me for disease management.  I will refer for pharmacy assistance and will call him again next week for follow up.  Eulah Pont. Myrtie Neither, MSN, [facility] Gerontological Nurse Practitioner [facility] [phone]

## 2019-07-06 NOTE — Telephone Encounter (Signed)
Rx was called in for pt today 12/4 was azithromycin for pt to take 1tab three times a week. Rx for Trelegy was also called in for pt today 07/06/2019. Rx for Singulair was also called in for pt to take 1 tab at bedtime. Rx for Omeprazole was also called in for pt to take 1 capsule daily.  Called pt but unable to reach. Left message for pt to return call.

## 2019-07-06 NOTE — Telephone Encounter (Signed)
Attempted to call pt but unable to reach. Left message for pt to return call. 

## 2019-07-06 NOTE — Telephone Encounter (Signed)
Pt calling stating that the tregely is to expensive he can be reached @ [phone] said that he was spoeaking to nurse and got cut off?Larry Briggs

## 2019-07-06 NOTE — Progress Notes (Signed)
Synopsis: COPD, lung nodule, former [facility] patient  Subjective:   PATIENT ID: Larry Briggs GENDER: male DOB: Mar 20, 1936, MRN: [id number]  Chief Complaint  Patient presents with  . Follow-up    f/u Acute bronchitis/ COPD. Patient stated still have some SOB.     HPI 83 year old man with history of COPD presenting to establish care for his COPD with new provider. - Former smoker - Mantainence airduo, PRN albuterol - PFT 2017 FEV1 0.9 (30% pred), lung volumes WNL, DLCO 41% pred - Comorbidities include afib on amiodarone, AC - Has 6 mm nodule due for CT Jan 2022 to document 2 year stabilty  Ever since a URI back in Nov 2019, patient has been having recurrent bronchitis bouts only temporarily relieved with prednisone/steroids.  Complicating control is formulary limitations on inhalers causing a lot of financial hardship for him and his wife.  Cough is deep down but he does state its worse supine and with exertion. Has concurrent nasal stuffiness but denies sinus issues or GERD.  No empiric trials for treating GERD/UACS have been tried yet.  He has no infectious symptoms.  ROS + symptoms in bold Fevers, chills, weight loss Nausea, vomiting, diarrhea Shortness of breath, wheezing, cough Chest pain, palpitations, lower ext edema   Past Medical History:  Diagnosis Date  . CAD (coronary artery disease)    Remote MI in 1980. CABG 1995. Stent to left main in 2004. Last cath in April of 2012. EF 50%; patent LIMA to DX/LAD with collateralization of the distal right, patent SVG to OM, occluded SVG to distal RCA, and patent stent to LAD  . History of heart attack 1980  . Hypercholesteremia   . Hypertension   . Increased glucose level   . Obesity      Family History  Problem Relation Age of Onset  . Heart attack Father   . Hypertension Father   . Stroke Mother      Past Surgical History:  Procedure Laterality Date  . CARDIAC CATHETERIZATION  1993  . CARDIAC CATHETERIZATION   April 2012   Mild reduction if EF at 50%. Patent LIMA to DX/LAD with collateralization to distal RCA, patent SVG to OM and occluded SVG to distal right and patent stent to LAD/Left main;  . CORONARY ARTERY BYPASS GRAFT  05/1994   x5 LIMA to LAD & DX, SVG to LCX, SVG to RCA  . CORONARY STENT PLACEMENT  2004   Stent to the L main  . INTRAMEDULLARY (IM) NAIL INTERTROCHANTERIC Left 09/05/2017   Procedure: INTRAMEDULLARY (IM) NAIL INTERTROCHANTRIC;  Surgeon: Renette Butters, MD;  Location: [facility];  Service: Orthopedics;  Laterality: Left;    Social History   Socioeconomic History  . Marital status: Married    Spouse name: Not on file  . Number of children: Not on file  . Years of education: Not on file  . Highest education level: Not on file  Occupational History  . Not on file  Social Needs  . Financial resource strain: Not on file  . Food insecurity    Worry: Not on file    Inability: Not on file  . Transportation needs    Medical: Not on file    Non-medical: Not on file  Tobacco Use  . Smoking status: Former Smoker    Packs/day: 1.00    Years: 40.00    Pack years: 40.00    Types: Cigarettes    Quit date: 07/02/1994    Years since quitting:  25.0  . Smokeless tobacco: Never Used  Substance and Sexual Activity  . Alcohol use: No  . Drug use: No  . Sexual activity: Yes  Lifestyle  . Physical activity    Days per week: Not on file    Minutes per session: Not on file  . Stress: Not on file  Relationships  . Social Herbalist on phone: Not on file    Gets together: Not on file    Attends religious service: Not on file    Active member of club or organization: Not on file    Attends meetings of clubs or organizations: Not on file    Relationship status: Not on file  . Intimate partner violence    Fear of current or ex partner: Not on file    Emotionally abused: Not on file    Physically abused: Not on file    Forced sexual activity: Not on file  Other Topics  Concern  . Not on file  Social History Narrative  . Not on file     Allergies  Allergen Reactions  . Iodinated Diagnostic Agents Nausea And Vomiting and Rash     Outpatient Medications Prior to Visit  Medication Sig Dispense Refill  . Bioflavonoid Products (ESTER-C) 500-550 MG TABS Take 500 mg by mouth daily.    . chlorthalidone (HYGROTON) 25 MG tablet Take 1 tablet (25 mg total) by mouth at bedtime. Please make annual appt with Dr. Martinique for further refills. [phone]. 1st attempt. 30 tablet 0  . doxycycline (VIBRA-TABS) 100 MG tablet Take 1 tablet (100 mg total) by mouth 2 (two) times daily. 14 tablet 0  . fish oil-omega-3 fatty acids 1000 MG capsule Take by mouth daily.     . Fluticasone-Salmeterol (AIRDUO RESPICLICK [id number]) XX123456 MCG/ACT AEPB Inhale 1 puff into the lungs 2 (two) times daily. 3 each 3  . metoprolol succinate (TOPROL-XL) 50 MG 24 hr tablet TAKE 1 AND 1/2 TABLETS AT BEDTIME 135 tablet 1  . multivitamin-iron-minerals-folic acid (CENTRUM) chewable tablet Chew 1 tablet by mouth daily.    . predniSONE (DELTASONE) 10 MG tablet 4 tabs for 2 days, then 3 tabs for 2 days, 2 tabs for 2 days, then 1 tab for 2 days, then stop 20 tablet 0  . rivaroxaban (XARELTO) 20 MG TABS tablet TAKE 1 TABLET (20 MG TOTAL) BY MOUTH DAILY WITH SUPPER. 90 tablet 1  . simvastatin (ZOCOR) 20 MG tablet Take 1 tablet (20 mg total) by mouth at bedtime. Please make annual appt with Dr. Martinique for further refills. [phone]. 1st attempt. 30 tablet 0  . tamsulosin (FLOMAX) 0.4 MG CAPS capsule Take 0.4 mg by mouth daily after supper.    . VENTOLIN HFA 108 (90 Base) MCG/ACT inhaler Inhale 2 puffs into the lungs every 4 (four) hours as needed for wheezing or shortness of breath. 18 g 5  . azithromycin (ZITHROMAX) 250 MG tablet 500mg  (two tablets) today, then 250mg  (1 tablet) for the next 4 days (Patient not taking: Reported on 07/06/2019) 6 tablet 0  . nitroGLYCERIN (NITROSTAT) 0.4 MG SL tablet Place 1  tablet (0.4 mg total) under the tongue as needed. (Patient not taking: Reported on 07/06/2019) 25 tablet 3   No facility-administered medications prior to visit.      Objective:  GEN: elderly man in NAD HEENT: MM dry, posterior cobblestoning, no thrush CV: RRR, ext warm PULM: Severely diminished bilaterally, no accessory muscle use GI: Soft, +BS EXT: No edema or clubbing  NEURO: Moves all 4 ext, ambulates well PSYCH: AOx3, good insight SKIN: No rashes    Vitals:   07/06/19 1441 07/06/19 1522  BP: 130/80   Pulse: 75   Resp: 18   Temp: 98.3 F (36.8 C)   TempSrc: Temporal   SpO2:  95%  Weight: 162 lb 12.8 oz (73.8 kg)   Height: 5\' 10"  (1.778 m)    95% on RA BMI Readings from Last 3 Encounters:  07/06/19 23.36 kg/m  12/27/18 23.68 kg/m  08/16/18 23.30 kg/m   Wt Readings from Last 3 Encounters:  07/06/19 162 lb 12.8 oz (73.8 kg)  12/27/18 165 lb (74.8 kg)  08/16/18 162 lb 6.4 oz (73.7 kg)     CBC    Component Value Date/Time   WBC 8.5 09/16/2017 1219   WBC 7.0 09/09/2017 0625   RBC 3.73 (L) 09/16/2017 1219   RBC 3.01 (L) 09/09/2017 0625   HGB 11.5 (L) 09/16/2017 1219   HCT 34.3 (L) 09/16/2017 1219   PLT 358 09/16/2017 1219   MCV 92 09/16/2017 1219   MCH 30.8 09/16/2017 1219   MCH 31.2 09/09/2017 0625   MCHC 33.5 09/16/2017 1219   MCHC 34.7 09/09/2017 0625   RDW 16.2 (H) 09/16/2017 1219   LYMPHSABS 0.7 09/16/2017 1219   MONOABS 0.5 09/04/2017 1047   EOSABS 0.1 09/16/2017 1219   BASOSABS 0.0 09/16/2017 1219    CT Chest July 2020 IMPRESSION: No acute cardiopulmonary disease.  Stable 6 mm nodule over the right lower lobe adjacent the diaphragm. Recommend 1 additional follow-up CT in 18 months to document 2 years of stability. This recommendation follows the consensus statement: Guidelines for Management of Small Pulmonary Nodules Detected on CT Scans: A Statement from the [employer] as published in Radiology 2005; 237:395-400. Online at:  https://www.arnold.com/.  Aortic Atherosclerosis (ICD10-I70.0) and Emphysema (ICD10-J43.9).  Atherosclerotic coronary artery disease post CABG.  Stable mild lumbar spine compression fracture.  Pulmonary Functions Testing Results: PFT Results Latest Ref Rng & Units 04/26/2016  FVC-Pre L 2.20  FVC-Predicted Pre % 53  FVC-Post L 2.41  FVC-Predicted Post % 58  Pre FEV1/FVC % % 40  Post FEV1/FCV % % 37  FEV1-Pre L 0.89  FEV1-Predicted Pre % 30  FEV1-Post L 0.90  DLCO UNC% % 41  DLCO COR %Predicted % 53  TLC L 8.20  TLC % Predicted % 116  RV % Predicted % 191   Eos WNL     Assessment & Plan:  # COPD- severe by spirometry # Severe persistent bronchitis since Fall 2019 after illness refractory to trials of steroids and antibiotics; CT in interim benign for signs of amiodarone associated ILD; will try to treat concomittant GERD # Afib on amiodarone and AC # Lung nodule- 25mm, stable on imaging, to follow x 2 years # Financial limitations to optimal inhaler prescriptions  Discussion: - Switch airduo to trelegy looking at formulary should be tier 3 (airduo tier 2), he will let us know how much this is; may have to do nebs if we cannot get an affordable option - Our medication assistance program staff is going to reach out to patient soon, appreciate help - Trial of salvage singulair, omeprazole and intermittent azithromycin to maximize suppressive therapies - f/u in 1 mo with EKG to assess QTc with both amiodarone and intermittent macrolide on board; he will be done steroids at that time and can assess eos and IgE, not checked since he developed this persistent bronchitis.  - CT Chest Jan  2022 for nodule follow up   Current Outpatient Medications:  .  Bioflavonoid Products (ESTER-C) 500-550 MG TABS, Take 500 mg by mouth daily., Disp: , Rfl:  .  chlorthalidone (HYGROTON) 25 MG tablet, Take 1 tablet (25 mg total) by mouth at bedtime. Please make annual  appt with Dr. Martinique for further refills. [phone]. 1st attempt., Disp: 30 tablet, Rfl: 0 .  doxycycline (VIBRA-TABS) 100 MG tablet, Take 1 tablet (100 mg total) by mouth 2 (two) times daily., Disp: 14 tablet, Rfl: 0 .  fish oil-omega-3 fatty acids 1000 MG capsule, Take by mouth daily. , Disp: , Rfl:  .  Fluticasone-Salmeterol (AIRDUO RESPICLICK [id number]) [id number] MCG/ACT AEPB, Inhale 1 puff into the lungs 2 (two) times daily., Disp: 3 each, Rfl: 3 .  metoprolol succinate (TOPROL-XL) 50 MG 24 hr tablet, TAKE 1 AND 1/2 TABLETS AT BEDTIME, Disp: 135 tablet, Rfl: 1 .  multivitamin-iron-minerals-folic acid (CENTRUM) chewable tablet, Chew 1 tablet by mouth daily., Disp: , Rfl:  .  predniSONE (DELTASONE) 10 MG tablet, 4 tabs for 2 days, then 3 tabs for 2 days, 2 tabs for 2 days, then 1 tab for 2 days, then stop, Disp: 20 tablet, Rfl: 0 .  rivaroxaban (XARELTO) 20 MG TABS tablet, TAKE 1 TABLET (20 MG TOTAL) BY MOUTH DAILY WITH SUPPER., Disp: 90 tablet, Rfl: 1 .  simvastatin (ZOCOR) 20 MG tablet, Take 1 tablet (20 mg total) by mouth at bedtime. Please make annual appt with Dr. Martinique for further refills. [phone]. 1st attempt., Disp: 30 tablet, Rfl: 0 .  tamsulosin (FLOMAX) 0.4 MG CAPS capsule, Take 0.4 mg by mouth daily after supper., Disp: , Rfl:  .  VENTOLIN HFA 108 (90 Base) MCG/ACT inhaler, Inhale 2 puffs into the lungs every 4 (four) hours as needed for wheezing or shortness of breath., Disp: 18 g, Rfl: 5 .  azithromycin (ZITHROMAX) 250 MG tablet, 500mg  (two tablets) today, then 250mg  (1 tablet) for the next 4 days (Patient not taking: Reported on 07/06/2019), Disp: 6 tablet, Rfl: 0 .  azithromycin (ZITHROMAX) 500 MG tablet, Take 1 tablet (500 mg total) by mouth 3 (three) times a week., Disp: 12 tablet, Rfl: 3 .  Fluticasone-Umeclidin-Vilant (TRELEGY ELLIPTA) 100-62.5-25 MCG/INH AEPB, Inhale 1 puff into the lungs daily., Disp: 30 each, Rfl: 3 .  montelukast (SINGULAIR) 10 MG tablet, Take 1 tablet (10  mg total) by mouth at bedtime., Disp: 90 tablet, Rfl: 0 .  nitroGLYCERIN (NITROSTAT) 0.4 MG SL tablet, Place 1 tablet (0.4 mg total) under the tongue as needed. (Patient not taking: Reported on 07/06/2019), Disp: 25 tablet, Rfl: 3 .  omeprazole (PRILOSEC) 20 MG capsule, Take 1 capsule (20 mg total) by mouth daily., Disp: 90 capsule, Rfl: 0   Candee Furbish, MD [facility] 07/06/2019 3:22 PM

## 2019-07-06 NOTE — Telephone Encounter (Signed)
Pt @ pharm.Larry Briggs

## 2019-07-06 NOTE — Telephone Encounter (Signed)
Pt alsso says that the script for the treglegy is just for a month and that he would try it for a mo and see what happens they paid for the med but says it was expensive amy need to swithc after a month.Larry Briggs

## 2019-07-06 NOTE — Patient Instructions (Addendum)
-   Trelegy, call us if not affordable, rinse out mouth after use.  If taking trelegy do not take airduo. - Omeprazole, singulair - Start taking azithromycin every Monday/Wednesday/Friday after you finish the current doxycycline - CT already scheduled for 2022 -We will see you back in 1 month with a EKG. We will call you to schedule this. If you are not feeling better we can have you back in 2 weeks.

## 2019-07-10 ENCOUNTER — Other Ambulatory Visit: Payer: Self-pay | Admitting: Pharmacist

## 2019-07-10 NOTE — Patient Outreach (Addendum)
[facility]) Care Management  Zeb   07/10/2019  Larry Briggs May 07, 1936 [id number]  Reason for referral: Medication Assistance with Trelegy and Xarelto  Referral source: [facility] NP Current insurance: [employer]  PMHx includes but not limited to:  COPD, HTN, HLD, ASCVD, atrial fibrillation on chronic anticoagulation  Outreach:  Successful telephone call with patient and spouse.  HIPAA identifiers verified.   Patient and spouse agreeable to review medications.  They report they each pay  "thousands" of dollars of medications each year and would like to see if they qualify for any assistance.   Objective: The ASCVD Risk score Mikey Bussing DC Jr., et al., 2013) failed to calculate for the following reasons:   The 2013 ASCVD risk score is only valid for ages 23 to 37  Lab Results  Component Value Date   CREATININE 1.08 09/09/2017   CREATININE 1.11 09/08/2017   CREATININE 1.30 (H) 09/07/2017    No results found for: HGBA1C  Lipid Panel     Component Value Date/Time   CHOL  11/14/2010 0500    112        ATP III CLASSIFICATION:  <200     mg/dL   Desirable  200-239  mg/dL   Borderline High  >=240    mg/dL   High          TRIG 108 11/14/2010 0500   HDL 40 11/14/2010 0500   CHOLHDL 2.8 11/14/2010 0500   VLDL 22 11/14/2010 0500   LDLCALC  11/14/2010 0500    50        Total Cholesterol/HDL:CHD Risk Coronary Heart Disease Risk Table                     Men   Women  1/2 Average Risk   3.4   3.3  Average Risk       5.0   4.4  2 X Average Risk   9.6   7.1  3 X Average Risk  23.4   11.0        Use the calculated Patient Ratio above and the CHD Risk Table to determine the patient's CHD Risk.        ATP III CLASSIFICATION (LDL):  <100     mg/dL   Optimal  100-129  mg/dL   Near or Above                    Optimal  130-159  mg/dL   Borderline  160-189  mg/dL   High  >190     mg/dL   Very High    BP Readings from Last 3 Encounters:   07/06/19 130/80  12/27/18 139/78  08/16/18 134/62    Allergies  Allergen Reactions  . Iodinated Diagnostic Agents Nausea And Vomiting and Rash    Medications Reviewed Today    Reviewed by Deloria Lair, NP (Nurse Practitioner) on 07/06/19 at [place] List Status: <None>  Medication Order Taking? Sig Documenting Provider Last Dose Status Informant  azithromycin (ZITHROMAX) 250 MG tablet [id number] No '500mg'$  (two tablets) today, then '250mg'$  (1 tablet) for the next 4 days  Patient not taking: Reported on 07/06/2019   Lauraine Rinne, NP Not Taking Active   azithromycin (ZITHROMAX) 500 MG tablet [id number]  Take 1 tablet (500 mg total) by mouth 3 (three) times a week. Candee Furbish, MD  Active   Bioflavonoid Products (ESTER-C) 500-550 MG TABS [id number] No Take 500  mg by mouth daily. [provider] Taking Active Self  chlorthalidone (HYGROTON) 25 MG tablet [id number] No Take 1 tablet (25 mg total) by mouth at bedtime. Please make annual appt with Dr. Martinique for further refills. [phone]. 1st attempt. Martinique, Peter M, MD Taking Active   doxycycline (VIBRA-TABS) 100 MG tablet [id number] No Take 1 tablet (100 mg total) by mouth 2 (two) times daily. Lauraine Rinne, NP Taking Active   fish oil-omega-3 fatty acids 1000 MG capsule [id number] No Take by mouth daily.  [provider] Taking Active Self  Fluticasone-Salmeterol (AIRDUO RESPICLICK 734/28) 768-11 MCG/ACT AEPB [id number] No Inhale 1 puff into the lungs 2 (two) times daily. Juanito Doom, MD Taking Active   Fluticasone-Umeclidin-Vilant (TRELEGY ELLIPTA) 100-62.5-25 MCG/INH AEPB [id number]  Inhale 1 puff into the lungs daily. Candee Furbish, MD  Active   metoprolol succinate (TOPROL-XL) 50 MG 24 hr tablet [id number] No TAKE 1 AND 1/2 TABLETS AT BEDTIME Martinique, Peter M, MD Taking Active   montelukast (SINGULAIR) 10 MG tablet [id number]  Take 1 tablet (10 mg total) by mouth at bedtime. Candee Furbish, MD  Active    multivitamin-iron-minerals-folic acid (CENTRUM) chewable tablet [id number] No Chew 1 tablet by mouth daily. [provider] Taking Active Self  nitroGLYCERIN (NITROSTAT) 0.4 MG SL tablet [id number] No Place 1 tablet (0.4 mg total) under the tongue as needed.  Patient not taking: Reported on 07/06/2019   Martinique, Peter M, MD Not Taking Active Self           Med Note Duffy Bruce, Sherrie Mustache Sep 04, 2017  4:30 PM) Patient has this, but hasn't ever had to use any  omeprazole (PRILOSEC) 20 MG capsule [id number]  Take 1 capsule (20 mg total) by mouth daily. Candee Furbish, MD  Active   predniSONE (DELTASONE) 10 MG tablet [id number] No 4 tabs for 2 days, then 3 tabs for 2 days, 2 tabs for 2 days, then 1 tab for 2 days, then stop Lauraine Rinne, NP Taking Active   rivaroxaban (XARELTO) 20 MG TABS tablet [id number] No TAKE 1 TABLET (20 MG TOTAL) BY MOUTH DAILY WITH SUPPER. Almyra Deforest, [place] Taking Active   simvastatin (ZOCOR) 20 MG tablet [id number] No Take 1 tablet (20 mg total) by mouth at bedtime. Please make annual appt with Dr. Martinique for further refills. [phone]. 1st attempt. Martinique, Peter M, MD Taking Active   tamsulosin Brand [facility]) 0.4 MG CAPS capsule [id number] No Take 0.4 mg by mouth daily after supper. [provider] Taking Active Self  VENTOLIN HFA 108 (90 Base) MCG/ACT inhaler [id number] No Inhale 2 puffs into the lungs every 4 (four) hours as needed for wheezing or shortness of breath. Juanito Doom, MD Taking Active           Assessment: Drugs sorted by system:  Hematologic: rivaroxaban  Cardiovascular: chlorthalidone, fish oil, metoprolol succinate, simvastatin, SL NTG  Pulmonary/Allergy: albuterol, Trelegy, montelukast  Gastrointestinal: omperazole  Endocrine: prednisone  Infectious Diseases: azithromycin, doxycycline  Genitourinary: tamsulosin  Vitamins/Minerals/Supplements: bioflavonoid  Medication Review Findings:  -Patient will finish course of doxycycline  then will start MWF azithromycing   Medication Assistance Findings:  Medication assistance needs identified: Trelegy and Xarelto  Extra Help:  Not eligible for Extra Help Low Income Subsidy based on reported income and assets  Patient Assistance Programs: Xarelto made by [employer] and [employer] o Income requirement met: No o Out-of-pocket prescription expenditure met:   Unknown - Patient has not met application requirements  to apply for this program at this time.  - Reviewed program requirements with patient.    Trelegy made by [employer] requirement met: No o Out-of-pocket prescription expenditure met:   Unknown - Patient has not met application requirements to apply for this program at this time.  - Reviewed program requirements with patient.    Discussed alternatives / substitutions at length with patient and spouse.  Also provided contact information to [facility] so they can research if there are more cost-effective Medicare plans available (unfortunately open enrollment ended Dec 7).  They both voiced understanding.  They wish to continue with current therapy for now but are appreciative of [facility] assistance.    Plan: . Will close [facility] pharmacy case as no further medication needs identified at this time.  Am happy to assist in the future as needed.     Ralene Bathe, PharmD, [place] [phone]

## 2019-07-10 NOTE — Telephone Encounter (Signed)
LMTCB x2 for pt 

## 2019-07-11 NOTE — Telephone Encounter (Signed)
LMTCB x3 for pt. We have attempted to contact pt several times with no success or call back from pt. Per triage protocol, message will be closed.   

## 2019-07-12 ENCOUNTER — Other Ambulatory Visit: Payer: Self-pay

## 2019-07-12 ENCOUNTER — Other Ambulatory Visit: Payer: Self-pay | Admitting: *Deleted

## 2019-07-12 MED ORDER — LEVOFLOXACIN 500 MG PO TABS: ORAL_TABLET | ORAL | 0 refills | Status: DC

## 2019-07-12 NOTE — Patient Outreach (Signed)
Telephone call to follow up on COPD.  Pt denies any exacerbation. Has his new inhaler Trelegy. Has talked with [facility] pharmacy assistant who advised he probably does not qualify for assistance with this medication.  He has used the Trelegy and he says he really likes that it is only one time a day dosing.  He continues to be on prednisone and an antibiotic. He will be glad to finish them and to just be on the Trelegy to see how he feels.  Reminded him to call MD if he goes into the [place] on his COPD Action Plan.  I will call him next week.  Eulah Pont. Myrtie Neither, MSN, [facility] Gerontological Nurse Practitioner [facility] Care Management [phone]

## 2019-07-19 ENCOUNTER — Other Ambulatory Visit: Payer: Self-pay | Admitting: *Deleted

## 2019-07-19 NOTE — Progress Notes (Signed)
Larry Briggs Date of Birth: 1935-09-24 Medical Record [id number]  History of Present Illness: Larry Briggs is seen back today for follow up CAD. He has a known history of coronary disease and status post coronary bypass surgery in 1995. He underwent stenting of the left main coronary in 2004. His last cardiac catheterization in April 2012 showed that everything was patent except for a vein graft to the distal right coronary. The PDA was supplied by left to right collaterals. He has been followed in our HTN clinic. He was having side effects on medications. This improved with stopping lisinopril and doxazosin and starting on valsartan. He does have severe COPD. His valsartan was later switched to irbesartan and then this was later discontinued.   He was  admitted in February with left hip fracture after a mechanical fall.  He underwent intramedullary nail in the left intertrochanteric hip.  EKG showed with new T wave inversion in anterolateral leads in the setting of tachycardia and anemia.  Cardiology was consulted for abnormal EKG.  Patient however denied any anginal symptom.  Echocardiogram obtained on 09/07/2017 showed EF 50-55%, grade 1 DD, trivial pericardial effusion.  Overall poor image.  Serial troponins were negative.  Suspicion for angina was low.  Overnight, he went into atrial fibrillation with RVR which was new.  He was not a anticoagulation candidate given significant bleeding risk after surgery.  The plan was to address anticoagulation therapy on follow-up. He subsequently converted to NSR. 30 day event monitor was recommended to see if he had any further Afib. This did demonstrate paroxysmal Afib and he was started on Xarelto. ASA was discontinued.   On follow up today he is doing OK from a cardiac standpoint. He has had more pulmonary issues. Recently seen by pulmonary for URI. Treated with steroids and antibiotics. Sputum grew pseudomonas. He is feeling better now. Has chronic SOB.  Oxygen  sats usually above 90% at home. BP typically 130/80.  No chest pain. No edema or palpitations.  He did have one nosebleed on Xarelto.  Weight is stable.    Current Outpatient Medications on File Prior to Visit  Medication Sig Dispense Refill  . azithromycin (ZITHROMAX) 500 MG tablet Take 1 tablet (500 mg total) by mouth 3 (three) times a week. 12 tablet 3  . Bioflavonoid Products (ESTER-C) 500-550 MG TABS Take 500 mg by mouth daily.    . chlorthalidone (HYGROTON) 25 MG tablet Take 1 tablet (25 mg total) by mouth at bedtime. Please make annual appt with Dr. Martinique for further refills. [phone]. 1st attempt. 30 tablet 0  . fish oil-omega-3 fatty acids 1000 MG capsule Take by mouth daily.     . Fluticasone-Umeclidin-Vilant (TRELEGY ELLIPTA) 100-62.5-25 MCG/INH AEPB Inhale 1 puff into the lungs daily. 30 each 3  . levofloxacin (LEVAQUIN) 500 MG tablet Take 1 tablet once daily for 7 days with food. Hold azithromycin until 07/23/19. 7 tablet 0  . metoprolol succinate (TOPROL-XL) 50 MG 24 hr tablet TAKE 1 AND 1/2 TABLETS AT BEDTIME 135 tablet 1  . montelukast (SINGULAIR) 10 MG tablet Take 1 tablet (10 mg total) by mouth at bedtime. 90 tablet 0  . nitroGLYCERIN (NITROSTAT) 0.4 MG SL tablet Place 1 tablet (0.4 mg total) under the tongue as needed. 25 tablet 3  . omeprazole (PRILOSEC) 20 MG capsule Take 1 capsule (20 mg total) by mouth daily. 90 capsule 0  . rivaroxaban (XARELTO) 20 MG TABS tablet TAKE 1 TABLET (20 MG TOTAL) BY MOUTH DAILY  WITH SUPPER. 90 tablet 1  . simvastatin (ZOCOR) 20 MG tablet Take 1 tablet (20 mg total) by mouth at bedtime. Please make annual appt with Dr. Martinique for further refills. [phone]. 1st attempt. 30 tablet 0  . tamsulosin (FLOMAX) 0.4 MG CAPS capsule Take 0.4 mg by mouth daily after supper.    . VENTOLIN HFA 108 (90 Base) MCG/ACT inhaler Inhale 2 puffs into the lungs every 4 (four) hours as needed for wheezing or shortness of breath. 18 g 5   No current  facility-administered medications on file prior to visit.    Allergies  Allergen Reactions  . Iodinated Diagnostic Agents Nausea And Vomiting and Rash    Past Medical History:  Diagnosis Date  . CAD (coronary artery disease)    Remote MI in 1980. CABG 1995. Stent to left main in 2004. Last cath in April of 2012. EF 50%; patent LIMA to DX/LAD with collateralization of the distal right, patent SVG to OM, occluded SVG to distal RCA, and patent stent to LAD  . History of heart attack 1980  . Hypercholesteremia   . Hypertension   . Increased glucose level   . Obesity     Past Surgical History:  Procedure Laterality Date  . CARDIAC CATHETERIZATION  1993  . CARDIAC CATHETERIZATION  April 2012   Mild reduction if EF at 50%. Patent LIMA to DX/LAD with collateralization to distal RCA, patent SVG to OM and occluded SVG to distal right and patent stent to LAD/Left main;  . CORONARY ARTERY BYPASS GRAFT  05/1994   x5 LIMA to LAD & DX, SVG to LCX, SVG to RCA  . CORONARY STENT PLACEMENT  2004   Stent to the L main  . INTRAMEDULLARY (IM) NAIL INTERTROCHANTERIC Left 09/05/2017   Procedure: INTRAMEDULLARY (IM) NAIL INTERTROCHANTRIC;  Surgeon: Renette Butters, MD;  Location: [facility];  Service: Orthopedics;  Laterality: Left;    Social History   Tobacco Use  Smoking Status Former Smoker  . Packs/day: 1.00  . Years: 40.00  . Pack years: 40.00  . Types: Cigarettes  . Quit date: 07/02/1994  . Years since quitting: 25.0  Smokeless Tobacco Never Used    Social History   Substance and Sexual Activity  Alcohol Use No    Family History  Problem Relation Age of Onset  . Heart attack Father   . Hypertension Father   . Stroke Mother     Review of Systems: The review of systems is per the HPI.  All other systems were reviewed and are negative.  Physical Exam: BP (!) 150/80   Pulse 91   Ht 5\' 11"  (1.803 m)   Wt 160 lb 6.4 oz (72.8 kg)   SpO2 (!) 84%   BMI 22.37 kg/m  GENERAL:  Well  appearing elderly WM in NAD HEENT:  PERRL, EOMI, sclera are clear. Oropharynx is clear. NECK:  No jugular venous distention, carotid upstroke brisk and symmetric, no bruits, no thyromegaly or adenopathy LUNGS:  Clear. Decreased BS CHEST:  Unremarkable HEART:  RRR with extrasystoles,  PMI not displaced or sustained,S1 and S2 within normal limits, no S3, no S4: no clicks, no rubs, no murmurs ABD:  Soft, nontender. BS +, no masses or bruits. No hepatomegaly, no splenomegaly EXT:  2 + pulses throughout, no edema, no cyanosis no clubbing SKIN:  Warm and dry.  No rashes NEURO:  Alert and oriented x 3. Cranial nerves II through XII intact. PSYCH:  Cognitively intact  LABORATORY DATA:   Lab Results  Component Value Date   WBC 8.5 09/16/2017   HGB 11.5 (L) 09/16/2017   HCT 34.3 (L) 09/16/2017   PLT 358 09/16/2017   GLUCOSE 135 (H) 09/09/2017   CHOL  11/14/2010    112        ATP III CLASSIFICATION:  <200     mg/dL   Desirable  200-239  mg/dL   Borderline High  >=240    mg/dL   High          TRIG 108 11/14/2010   HDL 40 11/14/2010   LDLCALC  11/14/2010    50        Total Cholesterol/HDL:CHD Risk Coronary Heart Disease Risk Table                     Men   Women  1/2 Average Risk   3.4   3.3  Average Risk       5.0   4.4  2 X Average Risk   9.6   7.1  3 X Average Risk  23.4   11.0        Use the calculated Patient Ratio above and the CHD Risk Table to determine the patient's CHD Risk.        ATP III CLASSIFICATION (LDL):  <100     mg/dL   Optimal  100-129  mg/dL   Near or Above                    Optimal  130-159  mg/dL   Borderline  160-189  mg/dL   High  >190     mg/dL   Very High   ALT 17 09/04/2017   AST 25 09/04/2017   NA 135 09/09/2017   K 4.1 09/09/2017   CL 99 (L) 09/09/2017   CREATININE 1.08 09/09/2017   BUN 20 09/09/2017   CO2 25 09/09/2017   TSH 0.800 09/06/2017   INR 1.05 09/04/2017   Labs reviewed from 07/22/16: cholesterol 174, triglycerides 140,  LDL 82, HDL 64. CMET normal Except creatinine reported as 242 (this has to be an error).  Dated 01/25/17: A1c 5.9%.  Dated 07/27/17: cholesterol 146, triglycerides 104, HDL 56, LDL 69.  Dated 08/03/18: cholesterol 151, triglycerides 102, HDL 58, LDL 73. CMET normal  Ecg today shows NSR with frequent PACs and PVCs. Nonspecific ST-T changes. I have personally reviewed and interpreted this study.   Echo 09/07/17: Study Conclusions  - Left ventricle: The cavity size was normal. Wall thickness was   increased in a pattern of mild LVH. Systolic function was normal.   The estimated ejection fraction was in the range of 50% to 55%.   Doppler parameters are consistent with abnormal left ventricular   relaxation (grade 1 diastolic dysfunction). - Aortic valve: Mildly calcified annulus. - Right atrium: The atrium was mildly dilated. - Pericardium, extracardiac: A trivial pericardial effusion was   identified.  Impressions:  - Technically difficult echo   Image quality is poor.  Event monitor 09/26/17: Study Highlights    Normal sinus rhythm  Atrial fibrillation- paroxysmal with controlled ventricular response  Isolated PVCs       Assessment / Plan: 1. CAD s/p CABG 1995. S/p stenting of the left main coronary artery 2004.  Cath 2012 showed occlusion of SVG to diagonal. He remains asymptomatic. Continue current anti-anginal Rx.   2. Paroxysmal Afib. Documented recurrence on event monitor. Asymptomatic. Now on Xarelto for anticoagulation. Tolerating  it well.   3.  HTN- well controlled.   4. Hyperlipidemia.  Continue statin and fish oil. At goal.  5. Severe COPD with recurrent bronchitis. - followed by pulmonary.   Follow up in 6 months.

## 2019-07-19 NOTE — Patient Outreach (Signed)
Call for COPD Disease Management. No answer, left a message and requested a return call.    Larry Briggs. Larry Neither, MSN, [facility] Gerontological Nurse Practitioner [facility] Care Management [phone]

## 2019-07-20 ENCOUNTER — Other Ambulatory Visit: Payer: Self-pay

## 2019-07-20 ENCOUNTER — Ambulatory Visit (INDEPENDENT_AMBULATORY_CARE_PROVIDER_SITE_OTHER): Payer: Medicare Other | Admitting: Cardiology

## 2019-07-20 ENCOUNTER — Encounter: Payer: Self-pay | Admitting: Cardiology

## 2019-07-20 VITALS — BP 150/80 | HR 91 | Ht 71.0 in | Wt 160.4 lb

## 2019-07-20 DIAGNOSIS — I480 Paroxysmal atrial fibrillation: Secondary | ICD-10-CM | POA: Diagnosis not present

## 2019-07-20 DIAGNOSIS — J449 Chronic obstructive pulmonary disease, unspecified: Secondary | ICD-10-CM | POA: Diagnosis not present

## 2019-07-20 DIAGNOSIS — I25810 Atherosclerosis of coronary artery bypass graft(s) without angina pectoris: Secondary | ICD-10-CM

## 2019-07-20 DIAGNOSIS — I10 Essential (primary) hypertension: Secondary | ICD-10-CM | POA: Diagnosis not present

## 2019-07-20 DIAGNOSIS — E7800 Pure hypercholesterolemia, unspecified: Secondary | ICD-10-CM

## 2019-07-20 MED ORDER — CHLORTHALIDONE 25 MG PO TABS: 25.0000 mg | ORAL_TABLET | Freq: Every day | ORAL | 3 refills | Status: DC

## 2019-07-20 MED ORDER — SIMVASTATIN 20 MG PO TABS: 20.0000 mg | ORAL_TABLET | Freq: Every day | ORAL | 3 refills | Status: DC

## 2019-07-23 ENCOUNTER — Other Ambulatory Visit: Payer: Self-pay | Admitting: *Deleted

## 2019-07-23 NOTE — Patient Outreach (Signed)
Telephone outreach for COPD Disease Management.  Mr. Tamminga reports his respiratory symptoms are improving with the steroid and antibiotic that was prescribed by his pulmonologist. He is coughing less with less production of sputum and the color is now more white. He is using the new Trelegy inhaler and states that he really can't tell if the effects are better than his previous meds since he is just getting over this infection.  He reports he still gets SOB with exertion and we discussed the importance of getting as much activity as he can without overexerting himself to build his stamina and improve his lung function.  We reviewed the COPD ACTION PLAN and he does know to use his rescue inhaler and then call his pulmonologist if sxs remain or if they get worse before he finds himself in the [place] and is going to the hospital.  His goal for the next 3 months will be to follow the actions plan and call his provider if he goes into the yellow zone, uses his rescue inhaler and calls his pulmonologist for instructions.  I will call him again in March 2021.  Eulah Pont. Myrtie Neither, MSN, [facility] Gerontological Nurse Practitioner [facility] Care Management [phone]

## 2019-07-28 ENCOUNTER — Telehealth: Payer: Self-pay | Admitting: Internal Medicine

## 2019-07-28 NOTE — Telephone Encounter (Signed)
Worse breathing since xmas eve with rattling congested cough but no cp fever or purulent sputum, comfortable at rest and after saba and wants to be seen 07/30/19   Rec 1) we need to get him in asap 07/30/19 to see first available 2) no change maint or prns for now  3) to ER if get's worse over weekend

## 2019-07-30 ENCOUNTER — Ambulatory Visit (INDEPENDENT_AMBULATORY_CARE_PROVIDER_SITE_OTHER): Payer: Medicare Other | Admitting: Internal Medicine

## 2019-07-30 ENCOUNTER — Other Ambulatory Visit: Payer: Self-pay

## 2019-07-30 ENCOUNTER — Ambulatory Visit (INDEPENDENT_AMBULATORY_CARE_PROVIDER_SITE_OTHER): Payer: Medicare Other

## 2019-07-30 ENCOUNTER — Encounter: Payer: Self-pay | Admitting: Internal Medicine

## 2019-07-30 ENCOUNTER — Other Ambulatory Visit: Payer: Self-pay | Admitting: Cardiology

## 2019-07-30 DIAGNOSIS — J449 Chronic obstructive pulmonary disease, unspecified: Secondary | ICD-10-CM

## 2019-07-30 DIAGNOSIS — I25810 Atherosclerosis of coronary artery bypass graft(s) without angina pectoris: Secondary | ICD-10-CM | POA: Diagnosis not present

## 2019-07-30 DIAGNOSIS — T148XXA Other injury of unspecified body region, initial encounter: Secondary | ICD-10-CM

## 2019-07-30 MED ORDER — SILVER SULFADIAZINE 1 % EX CREA: 1.0000 "application " | TOPICAL_CREAM | Freq: Two times a day (BID) | CUTANEOUS | 11 refills | Status: DC

## 2019-07-30 MED ORDER — FLUTTER DEVI: 0 refills | Status: AC

## 2019-07-30 MED ORDER — PREDNISONE 10 MG PO TABS: ORAL_TABLET | ORAL | 0 refills | Status: DC

## 2019-07-30 MED ORDER — TRELEGY ELLIPTA 100-62.5-25 MCG/INH IN AEPB: 1.0000 | INHALATION_SPRAY | Freq: Every day | RESPIRATORY_TRACT | 3 refills | Status: AC

## 2019-07-30 MED ORDER — TRELEGY ELLIPTA 100-62.5-25 MCG/INH IN AEPB: 1.0000 | INHALATION_SPRAY | Freq: Every day | RESPIRATORY_TRACT | 0 refills | Status: DC

## 2019-07-30 MED ORDER — NITROGLYCERIN 0.4 MG SL SUBL: 0.4000 mg | SUBLINGUAL_TABLET | SUBLINGUAL | 3 refills | Status: DC | PRN

## 2019-07-30 NOTE — Telephone Encounter (Signed)
Spoke with pt's wife, pt is scheduled for an ov today at 2:15 to see MW.  Nothing further needed at this time- will close encounter.

## 2019-07-30 NOTE — Telephone Encounter (Signed)
   *  STAT* If patient is at the pharmacy, call can be transferred to refill team.   1. Which medications need to be refilled? (please list name of each medication and dose if known) nitroGLYCERIN (NITROSTAT) 0.4 MG SL tablet 2. Which pharmacy/location (including street and city if local pharmacy) is medication to be sent to? [facility]  3. Do they need a 30 day or 90 day supply? [place]

## 2019-07-30 NOTE — Patient Instructions (Addendum)
Plan A = Automatic = Always=    Trelegy one daily first thing am   Plan B = Backup (to supplement plan A, not to replace it) For cough/breathing difficulty >>> use your Albuterol inhaler as a rescue medication to be used if you can't catch your breath by resting or doing a relaxed purse lip breathing pattern.  - The less you use it, the better it will work when you need it. - Ok to use the inhaler up to 2 puffs  every 4 hours if you must but call for appointment if use goes up over your usual need - Don't leave home without it !!  (think of it like the spare tire for your car)   Prednisone 10 mg take  4 each am x 2 days,   2 each am x 2 days,  1 each am x 2 days and stop   For cough / congestion >   Mucinex up to 1200 mg every 12 hours as needed and use flutter valve as much as can   Please remember to go to the  x-ray department  for your tests - we will call you with the results when they are available    Check to make sure this list is correct and call us with any discrepancy  Follow up should be in two weeks to see Dr Tamala Julian if possible

## 2019-07-30 NOTE — Progress Notes (Signed)
Larry Briggs, male    DOB: 05-02-1936,   MRN: [id number]   Brief patient profile:  73  yowm GOLD IV COPD  quit smoking 07/1994 seen by Dr Tamala Julian  07/06/2019 rec: - Trelegy, call us if not affordable, rinse out mouth after use.  If taking trelegy do not take airduo. - Omeprazole, singulair - Start taking azithromycin every Monday/Wednesday/Friday   you finish the current doxycycline      History of Present Illness  07/30/2019  Pulmonary/ 1st office eval/Willet Schleifer (pt new to me) - acute extended visit Chief Complaint  Patient presents with  . Acute Visit    Pt fell on 07/26/2019 due to extreme SOB on exertion. Patient has had a cough with a clear mucous and wheezing. Worse when ambulating.  Dyspnea:  Room to room / much worse since fell  Cough: cough is  rattling / mucus is lighter  Sleep: ok on side/ one pillow, bed is flat  SABA use: when "better" was still using at least daily  before activities even before flare  Has not used at all on day of ov ? Came off prednisone a week prior to flare but pt /wife not sure and very easily confused with details of care.  No obvious day to day or daytime variability or assoc  purulent sputum or mucus plugs or hemoptysis or cp or chest tightness, subjective wheeze or overt sinus or hb symptoms.   Sleeping as abive without nocturnal  or early am exacerbation  of respiratory  c/o's or need for noct saba. Also denies any obvious fluctuation of symptoms with weather or environmental changes or other aggravating or alleviating factors except as outlined above   No unusual exposure hx or h/o childhood pna/ asthma or knowledge of premature birth.  Current Allergies, Complete Past Medical History, Past Surgical History, Family History, and Social History were reviewed in [employer] record.  ROS  The following are not active complaints unless bolded Hoarseness, sore throat, dysphagia, dental problems, itching, sneezing,  nasal  congestion or discharge of excess mucus or purulent secretions, ear ache,   fever, chills, sweats, unintended wt loss or wt gain, classically pleuritic or exertional cp,  orthopnea pnd or arm/hand swelling  or leg swelling, presyncope, palpitations, abdominal pain, anorexia, nausea, vomiting, diarrhea  or change in bowel habits or change in bladder habits, change in stools or change in urine, dysuria, hematuria,  rash, arthralgias, visual complaints, headache, numbness, weakness or ataxia or problems with walking or coordination,  change in mood or  memory.             Past Medical History:  Diagnosis Date  . CAD (coronary artery disease)    Remote MI in 1980. CABG 1995. Stent to left main in 2004. Last cath in April of 2012. EF 50%; patent LIMA to DX/LAD with collateralization of the distal right, patent SVG to OM, occluded SVG to distal RCA, and patent stent to LAD  . History of heart attack 1980  . Hypercholesteremia   . Hypertension   . Increased glucose level   . Obesity     Outpatient Medications Prior to Visit - NOTE:   Unable to verify as accurately reflecting what pt takes     Medication Sig Dispense Refill  . azithromycin (ZITHROMAX) 500 MG tablet Take 1 tablet (500 mg total) by mouth 3 (three) times a week. 12 tablet 3  . Bioflavonoid Products (ESTER-C) 500-550 MG TABS Take 500 mg by mouth  daily.    . chlorthalidone (HYGROTON) 25 MG tablet Take 1 tablet (25 mg total) by mouth at bedtime. Please make annual appt with Dr. Martinique for further refills. [phone]. 1st attempt. 90 tablet 3  . fish oil-omega-3 fatty acids 1000 MG capsule Take by mouth daily.     . Fluticasone-Umeclidin-Vilant (TRELEGY ELLIPTA) 100-62.5-25 MCG/INH AEPB Inhale 1 puff into the lungs daily. 30 each 3  . metoprolol succinate (TOPROL-XL) 50 MG 24 hr tablet TAKE 1 AND 1/2 TABLETS AT BEDTIME 135 tablet 1  . montelukast (SINGULAIR) 10 MG tablet Take 1 tablet (10 mg total) by mouth at bedtime. 90 tablet 0  .  nitroGLYCERIN (NITROSTAT) 0.4 MG SL tablet Place 1 tablet (0.4 mg total) under the tongue as needed. 75 tablet 3  . omeprazole (PRILOSEC) 20 MG capsule Take 1 capsule (20 mg total) by mouth daily. 90 capsule 0  . rivaroxaban (XARELTO) 20 MG TABS tablet TAKE 1 TABLET (20 MG TOTAL) BY MOUTH DAILY WITH SUPPER. 90 tablet 1  . simvastatin (ZOCOR) 20 MG tablet Take 1 tablet (20 mg total) by mouth at bedtime. Please make annual appt with Dr. Martinique for further refills. [phone]. 1st attempt. 90 tablet 3  . tamsulosin (FLOMAX) 0.4 MG CAPS capsule Take 0.4 mg by mouth daily after supper.    . VENTOLIN HFA 108 (90 Base) MCG/ACT inhaler Inhale 2 puffs into the lungs every 4 (four) hours as needed for wheezing or shortness of breath. 18 g 5  . levofloxacin (LEVAQUIN) 500 MG tablet Take 1 tablet once daily for 7 days with food. Hold azithromycin until 07/23/19. 7 tablet 0         Objective:     BP 138/84 (BP Location: Left Arm, Patient Position: Sitting, Cuff Size: Large)   Pulse 69   Temp (!) 97.3 F (36.3 C) (Temporal)   Ht 5\' 10"  (1.778 m)   Wt 160 lb 6.4 oz (72.8 kg)   SpO2 95% Comment: on RA  BMI 23.02 kg/m   SpO2: 95 %(on RA)   W/c bound elderly wm with a loose rattling patttern cough but nothing comes up  HEENT : pt wearing mask not removed for exam due to covid -19 concerns.    NECK :  without JVD/Nodes/TM/ nl carotid upstrokes bilaterally   LUNGS: no acc muscle use,  Mod barrel  contour chest wall with bilateral  Distant bs s audible wheeze and  without cough on insp or exp maneuvers and mod  Hyperresonant  to  percussion bilaterally     CV:  Slightly irreg with apical pulse around 70  no s3 or murmur or increase in P2, and no edema   ABD:  soft and nontender with pos mid insp Hoover's  in the supine position. No bruits or organomegaly appreciated, bowel sounds nl  MS:     ext warm without deformities, calf tenderness, cyanosis or clubbing No obvious joint restrictions    SKIN: warm and dry with extensive wrapping over R > L arms    NEURO:  alert, approp, nl sensorium with  no motor or cerebellar deficits apparent.        CXR PA and Lateral:   07/30/2019 :    I personally reviewed images and agree with radiology impression as follows:    Chronic changes without acute abnormality.    Assessment   COPD GOLD 4  Quit smoking 1995 - Spirometry  04/26/16   FEV1 0.90 (30%)  Ratio 0.37  No resp to  saba p ? Prior to study - 07/30/2019  After extensive coaching inhaler device,  effectiveness =    75% (short Ti)     Group D in terms of symptom/risk and laba/lama/ICS  therefore appropriate rx at this point >>>  trelegy approp but note also using saba daily before activities  Reviewed: I spent extra time with pt today reviewing appropriate use of albuterol for prn use on exertion with the following points: 1) saba is for relief of sob that does not improve by walking a slower pace or resting but rather if the pt does not improve after trying this first. 2) If the pt is convinced, as many are, that saba helps recover from activity faster then it's easy to tell if this is the case by re-challenging : ie stop, take the inhaler, then p 5 minutes try the exact same activity (intensity of workload) that just caused the symptoms and see if they are substantially diminished or not after saba 3) if there is an activity that reproducibly causes the symptoms, try the saba 15 min before the activity on alternate days   If in fact the saba really does help, then fine to continue to use it prn but advised may need to look closer at the maintenance regimen being used to achieve better control of airways disease with exertion.   >>> Clearly worse off pred so rec Prednisone 10 mg take  4 each am x 2 days,   2 each am x 2 days,  1 each am x 2 days and stop and add flutter to help mobilize secretions. No indication for additional abx now on zmax tiw    Abrasion [place] Jul 26 2019  extensive R arm abrasions  - 07/30/2019 office eval > rec silvadene up to bid/ continue dressing changes   Reports utd on tetanus > f/u PCP     I had an extended discussion with the patient reviewing all relevant studies completed to date and  lasting 25 minutes of a 40  minute acute office visit with pt new to me    re  severe non-specific but potentially very serious refractory respiratory symptoms of uncertain and potentially multiple  Etiologies.  I performed device teaching  using a teach back technique which also  extended face to face time for this visit (see above)    Each maintenance medication was reviewed in detail including most importantly the difference between maintenance and prns and under what circumstances the prns are to be triggered using an action plan format that is not reflected in the computer generated alphabetically organized AVS.    Please see AVS for specific instructions unique to this office visit that I personally wrote and verbalized to the the pt in detail and then reviewed with pt  by my nurse highlighting any changes in therapy/plan of care  recommended at today's visit.         Christinia Gully, MD 07/30/2019

## 2019-07-31 ENCOUNTER — Encounter: Payer: Self-pay | Admitting: Internal Medicine

## 2019-07-31 ENCOUNTER — Telehealth: Payer: Self-pay | Admitting: Internal Medicine

## 2019-07-31 NOTE — Progress Notes (Signed)
Called and spoke with the pt's spouse ok per DPR and notified of results

## 2019-07-31 NOTE — Progress Notes (Signed)
Spoke with pt and notified of results per Dr. Wert. Pt verbalized understanding and denied any questions. 

## 2019-07-31 NOTE — Assessment & Plan Note (Signed)
Fell Jul 26 2019 extensive R arm abrasions  - 07/30/2019 office eval > rec silvadene up to bid/ continue dressing changes   Reports utd on tetanus > f/u PCP   I had an extended discussion with the patient reviewing all relevant studies completed to date and  lasting 25 minutes of a 40  minute acute office visit with pt new to me    re  severe non-specific but potentially very serious refractory respiratory symptoms of uncertain and potentially multiple  Etiologies.  I performed device teaching  using a teach back technique which also  extended face to face time for this visit (see above)    Each maintenance medication was reviewed in detail including most importantly the difference between maintenance and prns and under what circumstances the prns are to be triggered using an action plan format that is not reflected in the computer generated alphabetically organized AVS.    Please see AVS for specific instructions unique to this office visit that I personally wrote and verbalized to the the pt in detail and then reviewed with pt  by my nurse highlighting any changes in therapy/plan of care  recommended at today's visit.

## 2019-07-31 NOTE — Telephone Encounter (Signed)
Called and spoke with pt's wife Manuela Schwartz. Manuela Schwartz provided me with the names of the prn meds pt takes and I have added them to pt's medlist so we have all updated. Nothing further needed.

## 2019-07-31 NOTE — Assessment & Plan Note (Addendum)
Quit smoking 1995 - Spirometry  04/26/16   FEV1 0.90 (30%)  Ratio 0.37  No resp to saba p ? Prior to study - 07/30/2019  After extensive coaching inhaler device,  effectiveness =    75% (short Ti)     Group D in terms of symptom/risk and laba/lama/ICS  therefore appropriate rx at this point >>>  trelegy approp but note also using saba daily before activities  Reviewed: I spent extra time with pt today reviewing appropriate use of albuterol for prn use on exertion with the following points: 1) saba is for relief of sob that does not improve by walking a slower pace or resting but rather if the pt does not improve after trying this first. 2) If the pt is convinced, as many are, that saba helps recover from activity faster then it's easy to tell if this is the case by re-challenging : ie stop, take the inhaler, then p 5 minutes try the exact same activity (intensity of workload) that just caused the symptoms and see if they are substantially diminished or not after saba 3) if there is an activity that reproducibly causes the symptoms, try the saba 15 min before the activity on alternate days   If in fact the saba really does help, then fine to continue to use it prn but advised may need to look closer at the maintenance regimen being used to achieve better control of airways disease with exertion.   >>> Clearly worse off pred so rec Prednisone 10 mg take  4 each am x 2 days,   2 each am x 2 days,  1 each am x 2 days and stop and add flutter to help mobilize secretions. No indication for additional abx now on zmax tiw

## 2019-08-01 ENCOUNTER — Ambulatory Visit: Payer: Medicare Other | Admitting: Physician Assistant

## 2019-08-06 LAB — RESPIRATORY CULTURE OR RESPIRATORY AND SPUTUM CULTURE
MICRO NUMBER:: 1160091
RESULT:: NORMAL
SPECIMEN QUALITY:: ADEQUATE

## 2019-08-06 LAB — FUNGUS CULTURE W SMEAR
MICRO NUMBER:: 1160090
SMEAR:: NONE SEEN
SPECIMEN QUALITY:: ADEQUATE

## 2019-08-13 DIAGNOSIS — Z0000 Encounter for general adult medical examination without abnormal findings: Secondary | ICD-10-CM | POA: Diagnosis not present

## 2019-08-13 DIAGNOSIS — J449 Chronic obstructive pulmonary disease, unspecified: Secondary | ICD-10-CM | POA: Diagnosis not present

## 2019-08-13 DIAGNOSIS — Z7901 Long term (current) use of anticoagulants: Secondary | ICD-10-CM | POA: Diagnosis not present

## 2019-08-13 DIAGNOSIS — N183 Chronic kidney disease, stage 3 unspecified: Secondary | ICD-10-CM | POA: Diagnosis not present

## 2019-08-13 DIAGNOSIS — D692 Other nonthrombocytopenic purpura: Secondary | ICD-10-CM | POA: Diagnosis not present

## 2019-08-13 DIAGNOSIS — I10 Essential (primary) hypertension: Secondary | ICD-10-CM | POA: Diagnosis not present

## 2019-08-13 DIAGNOSIS — I259 Chronic ischemic heart disease, unspecified: Secondary | ICD-10-CM | POA: Diagnosis not present

## 2019-08-13 DIAGNOSIS — D6869 Other thrombophilia: Secondary | ICD-10-CM | POA: Diagnosis not present

## 2019-08-13 DIAGNOSIS — E1121 Type 2 diabetes mellitus with diabetic nephropathy: Secondary | ICD-10-CM | POA: Diagnosis not present

## 2019-08-13 DIAGNOSIS — I739 Peripheral vascular disease, unspecified: Secondary | ICD-10-CM | POA: Diagnosis not present

## 2019-08-13 DIAGNOSIS — I4891 Unspecified atrial fibrillation: Secondary | ICD-10-CM | POA: Diagnosis not present

## 2019-08-13 DIAGNOSIS — R972 Elevated prostate specific antigen [PSA]: Secondary | ICD-10-CM | POA: Diagnosis not present

## 2019-08-20 ENCOUNTER — Other Ambulatory Visit: Payer: Self-pay

## 2019-08-20 ENCOUNTER — Ambulatory Visit (INDEPENDENT_AMBULATORY_CARE_PROVIDER_SITE_OTHER): Payer: Medicare Other | Admitting: Internal Medicine

## 2019-08-20 ENCOUNTER — Encounter: Payer: Self-pay | Admitting: Internal Medicine

## 2019-08-20 VITALS — BP 118/72 | HR 63 | Temp 97.0°F | Ht 70.0 in | Wt 159.8 lb

## 2019-08-20 DIAGNOSIS — R911 Solitary pulmonary nodule: Secondary | ICD-10-CM

## 2019-08-20 DIAGNOSIS — J449 Chronic obstructive pulmonary disease, unspecified: Secondary | ICD-10-CM | POA: Diagnosis not present

## 2019-08-20 NOTE — Progress Notes (Signed)
Synopsis: COPD, lung nodule, former [facility] patient  Subjective:   PATIENT ID: Larry Briggs GENDER: male DOB: 21-Feb-1936, MRN: [id number]  Chief Complaint  Patient presents with  . Follow-up    COPD, lung nodule    HPI 84 year old man with GOLD D COPD presenting for followup. - Former smoker - Theme park manager, PRN albuterol, singulair, omeprazole - PFT 2017 FEV1 0.9 (30% pred), lung volumes WNL, DLCO 41% pred - Comorbidities include afib on amiodarone, AC - Has 6 mm nodule due for CT Jan 2022 to document 2 year stabilty - Sputum in early December with pseudomonas: treated with levaquin  Feeling much better with current regimen Now off steroids, off azithromycin Not needing rescue inhaler MMRC 1  ROS + symptoms in bold Fevers, chills, weight loss Nausea, vomiting, diarrhea Shortness of breath, wheezing, cough Chest pain, palpitations, lower ext edema   Past Medical History:  Diagnosis Date  . CAD (coronary artery disease)    Remote MI in 1980. CABG 1995. Stent to left main in 2004. Last cath in April of 2012. EF 50%; patent LIMA to DX/LAD with collateralization of the distal right, patent SVG to OM, occluded SVG to distal RCA, and patent stent to LAD  . History of heart attack 1980  . Hypercholesteremia   . Hypertension   . Increased glucose level   . Obesity      Family History  Problem Relation Age of Onset  . Heart attack Father   . Hypertension Father   . Stroke Mother      Past Surgical History:  Procedure Laterality Date  . CARDIAC CATHETERIZATION  1993  . CARDIAC CATHETERIZATION  April 2012   Mild reduction if EF at 50%. Patent LIMA to DX/LAD with collateralization to distal RCA, patent SVG to OM and occluded SVG to distal right and patent stent to LAD/Left main;  . CORONARY ARTERY BYPASS GRAFT  05/1994   x5 LIMA to LAD & DX, SVG to LCX, SVG to RCA  . CORONARY STENT PLACEMENT  2004   Stent to the L main  . INTRAMEDULLARY (IM) NAIL INTERTROCHANTERIC Left  09/05/2017   Procedure: INTRAMEDULLARY (IM) NAIL INTERTROCHANTRIC;  Surgeon: Renette Butters, MD;  Location: [facility];  Service: Orthopedics;  Laterality: Left;    Social History   Socioeconomic History  . Marital status: Married    Spouse name: Not on file  . Number of children: Not on file  . Years of education: Not on file  . Highest education level: Not on file  Occupational History  . Not on file  Tobacco Use  . Smoking status: Former Smoker    Packs/day: 0.50    Years: 40.00    Pack years: 20.00    Types: Cigarettes    Quit date: 07/02/1994    Years since quitting: 25.1  . Smokeless tobacco: Never Used  Substance and Sexual Activity  . Alcohol use: No  . Drug use: No  . Sexual activity: Yes  Other Topics Concern  . Not on file  Social History Narrative  . Not on file   Social Determinants of Health   Financial Resource Strain:   . Difficulty of Paying Living Expenses: Not on file  Food Insecurity:   . Worried About Charity fundraiser in the Last Year: Not on file  . Ran Out of Food in the Last Year: Not on file  Transportation Needs:   . Lack of Transportation (Medical): Not on file  . Lack  of Transportation (Non-Medical): Not on file  Physical Activity:   . Days of Exercise per Week: Not on file  . Minutes of Exercise per Session: Not on file  Stress:   . Feeling of Stress : Not on file  Social Connections:   . Frequency of Communication with Friends and Family: Not on file  . Frequency of Social Gatherings with Friends and Family: Not on file  . Attends Religious Services: Not on file  . Active Member of Clubs or Organizations: Not on file  . Attends Archivist Meetings: Not on file  . Marital Status: Not on file  Intimate Partner Violence:   . Fear of Current or Ex-Partner: Not on file  . Emotionally Abused: Not on file  . Physically Abused: Not on file  . Sexually Abused: Not on file     Allergies  Allergen Reactions  . Iodinated  Diagnostic Agents Nausea And Vomiting and Rash     Outpatient Medications Prior to Visit  Medication Sig Dispense Refill  . acetaminophen (TYLENOL) 650 MG CR tablet Take 650 mg by mouth every 8 (eight) hours as needed.    Marland Kitchen Bioflavonoid Products (ESTER-C) 500-550 MG TABS Take 500 mg by mouth daily.    . chlorthalidone (HYGROTON) 25 MG tablet Take 1 tablet (25 mg total) by mouth at bedtime. Please make annual appt with Dr. Martinique for further refills. [phone]. 1st attempt. 90 tablet 3  . docusate sodium (COLACE) 100 MG capsule Take 100 mg by mouth as needed.    . doxylamine, Sleep, (UNISOM) 25 MG tablet Take 25 mg by mouth at bedtime as needed.    . fish oil-omega-3 fatty acids 1000 MG capsule Take by mouth daily.     . Fluticasone-Umeclidin-Vilant (TRELEGY ELLIPTA) 100-62.5-25 MCG/INH AEPB Inhale 1 puff into the lungs daily. 90 each 3  . guaiFENesin (MUCINEX) 600 MG 12 hr tablet Take by mouth 2 (two) times daily as needed.    . metoprolol succinate (TOPROL-XL) 50 MG 24 hr tablet TAKE 1 AND 1/2 TABLETS AT BEDTIME 135 tablet 1  . montelukast (SINGULAIR) 10 MG tablet Take 1 tablet (10 mg total) by mouth at bedtime. 90 tablet 0  . nitroGLYCERIN (NITROSTAT) 0.4 MG SL tablet Place 1 tablet (0.4 mg total) under the tongue as needed. 75 tablet 3  . omeprazole (PRILOSEC) 20 MG capsule Take 1 capsule (20 mg total) by mouth daily. 90 capsule 0  . Respiratory Therapy Supplies (FLUTTER) DEVI Use as directed 1 each 0  . rivaroxaban (XARELTO) 20 MG TABS tablet TAKE 1 TABLET (20 MG TOTAL) BY MOUTH DAILY WITH SUPPER. 90 tablet 1  . simvastatin (ZOCOR) 20 MG tablet Take 1 tablet (20 mg total) by mouth at bedtime. Please make annual appt with Dr. Martinique for further refills. [phone]. 1st attempt. 90 tablet 3  . tamsulosin (FLOMAX) 0.4 MG CAPS capsule Take 0.4 mg by mouth daily after supper.    . VENTOLIN HFA 108 (90 Base) MCG/ACT inhaler Inhale 2 puffs into the lungs every 4 (four) hours as needed for  wheezing or shortness of breath. 18 g 5  . predniSONE (DELTASONE) 10 MG tablet Take  4 each am x 2 days,   2 each am x 2 days,  1 each am x 2 days and stop (Patient not taking: Reported on 08/20/2019) 14 tablet 0  . silver sulfADIAZINE (SILVADENE) 1 % cream Apply 1 application topically 2 (two) times daily. (Patient not taking: Reported on 08/20/2019) 400 g 11  No facility-administered medications prior to visit.     Objective:  GEN: elderly man in NAD HEENT: MM dry, posterior cobblestoning, no thrush CV: RRR, ext warm PULM: better air movement than last time GI: Soft, +BS EXT: No edema or clubbing NEURO: Moves all 4 ext, ambulates with cane PSYCH: AOx3, good insight SKIN: No rashes, bruising along left arm from fall    Vitals:   08/20/19 1419  BP: 118/72  Pulse: 63  Temp: (!) 97 F (36.1 C)  SpO2: 98%  Weight: 159 lb 12.8 oz (72.5 kg)  Height: 5\' 10"  (1.778 m)   98% on RA BMI Readings from Last 3 Encounters:  08/20/19 22.93 kg/m  07/30/19 23.02 kg/m  07/20/19 22.37 kg/m   Wt Readings from Last 3 Encounters:  08/20/19 159 lb 12.8 oz (72.5 kg)  07/30/19 160 lb 6.4 oz (72.8 kg)  07/20/19 160 lb 6.4 oz (72.8 kg)     CBC    Component Value Date/Time   WBC 8.5 09/16/2017 1219   WBC 7.0 09/09/2017 0625   RBC 3.73 (L) 09/16/2017 1219   RBC 3.01 (L) 09/09/2017 0625   HGB 11.5 (L) 09/16/2017 1219   HCT 34.3 (L) 09/16/2017 1219   PLT 358 09/16/2017 1219   MCV 92 09/16/2017 1219   MCH 30.8 09/16/2017 1219   MCH 31.2 09/09/2017 0625   MCHC 33.5 09/16/2017 1219   MCHC 34.7 09/09/2017 0625   RDW 16.2 (H) 09/16/2017 1219   LYMPHSABS 0.7 09/16/2017 1219   MONOABS 0.5 09/04/2017 1047   EOSABS 0.1 09/16/2017 1219   BASOSABS 0.0 09/16/2017 1219    CT Chest July 2020 IMPRESSION: No acute cardiopulmonary disease.  Stable 6 mm nodule over the right lower lobe adjacent the diaphragm. Recommend 1 additional follow-up CT in 18 months to document 2 years of stability.  This recommendation follows the consensus statement: Guidelines for Management of Small Pulmonary Nodules Detected on CT Scans: A Statement from the [place] as published in Radiology 2005; 237:395-400. Online at: https://www.arnold.com/.  Aortic Atherosclerosis (ICD10-I70.0) and Emphysema (ICD10-J43.9).  Atherosclerotic coronary artery disease post CABG.  Stable mild lumbar spine compression fracture.  Pulmonary Functions Testing Results: PFT Results Latest Ref Rng & Units 04/26/2016  FVC-Pre L 2.20  FVC-Predicted Pre % 53  FVC-Post L 2.41  FVC-Predicted Post % 58  Pre FEV1/FVC % % 40  Post FEV1/FCV % % 37  FEV1-Pre L 0.89  FEV1-Predicted Pre % 30  FEV1-Post L 0.90  DLCO UNC% % 41  DLCO COR %Predicted % 53  TLC L 8.20  TLC % Predicted % 116  RV % Predicted % 191   Eos WNL     Assessment & Plan:  # GOLD D COPD # Afib on amiodarone and AC # Recent bout of pseudomonas bronchitis pan sensitive treated early Dec 2020 # Lung nodule- 6mm, stable on imaging, to follow x 2 years # Financial limitations to optimal inhaler prescriptions  Discussion: - Continue trelegy, PRN albuterol, omeprazole - Trial off singulair, he will resume if develops recurrent sinus/nasal drainage and worsening cough - Continue omeprazole for now, if doing well at 3 months can peel this off too - Advised him to get COVID vaccine when available - 3 mo tele visit, 6 mo in person visit  - CT Chest Jan 2022 for nodule follow up   Current Outpatient Medications:  .  acetaminophen (TYLENOL) 650 MG CR tablet, Take 650 mg by mouth every 8 (eight) hours as needed., Disp: , Rfl:  .  Bioflavonoid Products (ESTER-C) 500-550 MG TABS, Take 500 mg by mouth daily., Disp: , Rfl:  .  chlorthalidone (HYGROTON) 25 MG tablet, Take 1 tablet (25 mg total) by mouth at bedtime. Please make annual appt with Dr. Martinique for further refills. [phone]. 1st attempt., Disp: 90 tablet,  Rfl: 3 .  docusate sodium (COLACE) 100 MG capsule, Take 100 mg by mouth as needed., Disp: , Rfl:  .  doxylamine, Sleep, (UNISOM) 25 MG tablet, Take 25 mg by mouth at bedtime as needed., Disp: , Rfl:  .  fish oil-omega-3 fatty acids 1000 MG capsule, Take by mouth daily. , Disp: , Rfl:  .  Fluticasone-Umeclidin-Vilant (TRELEGY ELLIPTA) 100-62.5-25 MCG/INH AEPB, Inhale 1 puff into the lungs daily., Disp: 90 each, Rfl: 3 .  guaiFENesin (MUCINEX) 600 MG 12 hr tablet, Take by mouth 2 (two) times daily as needed., Disp: , Rfl:  .  metoprolol succinate (TOPROL-XL) 50 MG 24 hr tablet, TAKE 1 AND 1/2 TABLETS AT BEDTIME, Disp: 135 tablet, Rfl: 1 .  montelukast (SINGULAIR) 10 MG tablet, Take 1 tablet (10 mg total) by mouth at bedtime., Disp: 90 tablet, Rfl: 0 .  nitroGLYCERIN (NITROSTAT) 0.4 MG SL tablet, Place 1 tablet (0.4 mg total) under the tongue as needed., Disp: 75 tablet, Rfl: 3 .  omeprazole (PRILOSEC) 20 MG capsule, Take 1 capsule (20 mg total) by mouth daily., Disp: 90 capsule, Rfl: 0 .  Respiratory Therapy Supplies (FLUTTER) DEVI, Use as directed, Disp: 1 each, Rfl: 0 .  rivaroxaban (XARELTO) 20 MG TABS tablet, TAKE 1 TABLET (20 MG TOTAL) BY MOUTH DAILY WITH SUPPER., Disp: 90 tablet, Rfl: 1 .  simvastatin (ZOCOR) 20 MG tablet, Take 1 tablet (20 mg total) by mouth at bedtime. Please make annual appt with Dr. Martinique for further refills. [phone]. 1st attempt., Disp: 90 tablet, Rfl: 3 .  tamsulosin (FLOMAX) 0.4 MG CAPS capsule, Take 0.4 mg by mouth daily after supper., Disp: , Rfl:  .  VENTOLIN HFA 108 (90 Base) MCG/ACT inhaler, Inhale 2 puffs into the lungs every 4 (four) hours as needed for wheezing or shortness of breath., Disp: 18 g, Rfl: [place], MD [facility] 08/20/2019 3:03 PM

## 2019-08-20 NOTE — Patient Instructions (Signed)
-   Can try going off singulair, if cough and nose drainage gets worse, start taking it again - Continue omeprazole - Continue trelegy - Telephone visit in 3 months, call sooner if doing unwell - Get COVID vaccine

## 2019-08-23 ENCOUNTER — Ambulatory Visit: Payer: Medicare Other | Attending: Internal Medicine

## 2019-08-23 DIAGNOSIS — Z23 Encounter for immunization: Secondary | ICD-10-CM | POA: Insufficient documentation

## 2019-08-23 NOTE — Progress Notes (Signed)
   Covid-19 Vaccination Clinic  Name:  Larry Briggs    MRN: [id number] DOB: [birth year]  08/23/2019  Mr. Dimeo was observed post Covid-19 immunization for 15 minutes without incidence. He was provided with Vaccine Information Sheet and instruction to access the V-Safe system.   Mr. Bonawitz was instructed to call 911 with any severe reactions post vaccine: Marland Kitchen Difficulty breathing  . Swelling of your face and throat  . A fast heartbeat  . A bad rash all over your body  . Dizziness and weakness    Immunizations Administered    Name Date Dose VIS Date Route   Pfizer COVID-19 Vaccine 08/23/2019 10:49 AM 0.3 mL 07/13/2019 Intramuscular   Manufacturer: [place]   Lot: [id number]   [place]: [id number]

## 2019-08-29 ENCOUNTER — Telehealth: Payer: Self-pay | Admitting: Internal Medicine

## 2019-08-29 NOTE — Telephone Encounter (Signed)
Left message for Susan to call back

## 2019-08-30 NOTE — Telephone Encounter (Signed)
Called and spoke to pt's wife, Larry Briggs. Larry Briggs states pt is having frequent nose bleeds, having 3 episodes in a week. Pt states they last for about an hour to 1.5 hours. Pt has tried home remedies but nothing helps. Larry Briggs is requesting if Dr. Tamala Julian has a recommendation of a ENT provider they can see. Pt's insurance does not require a referral, they would just like Dr. Darliss Ridgel opinion about a ENT provider they could see for his epistaxis.   Dr. Tamala Julian please advise. Thanks.

## 2019-08-30 NOTE — Telephone Encounter (Signed)
I'm new to area. Whoever you guys use is fine.  Larry Briggs

## 2019-08-30 NOTE — Telephone Encounter (Signed)
Called and spoke to pt's wife, Manuela Schwartz. Informed her that an ENT that the practice uses a lot is Dr. Wilburn Cornelia, ENT. Manuela Schwartz verbalized understanding and denied any further questions or concerns at this time.

## 2019-09-04 ENCOUNTER — Telehealth: Payer: Self-pay | Admitting: Internal Medicine

## 2019-09-04 DIAGNOSIS — R040 Epistaxis: Secondary | ICD-10-CM

## 2019-09-04 NOTE — Telephone Encounter (Signed)
Called and spoke with pt's wife Manuela Schwartz.  Per Manuela Schwartz, Dr. Victorio Palm office is requiring a referral in order for pt to be seen for a consult due to the nosebleeds. Manuela Schwartz stated the person at Dr. Victorio Palm office whom she spoke with stated the reason why they need the referral is to make sure pt has not been seen at any other ENT office, even though it is not required with her insurance for pt to have a referral.  Dr. Tamala Julian, are you okay if we place referral for pt to be referred to Dr. Victorio Palm office due to the nosebleeds pt has been having?

## 2019-09-04 NOTE — Telephone Encounter (Signed)
Sure sounds good thanks

## 2019-09-05 NOTE — Telephone Encounter (Signed)
Called and spoke to pt's wife, Manuela Schwartz. Informed her of the referral. Order placed. Pt verbalized understanding and denied any further questions or concerns at this time.

## 2019-09-13 ENCOUNTER — Ambulatory Visit: Payer: Medicare Other | Attending: Internal Medicine

## 2019-09-13 DIAGNOSIS — Z23 Encounter for immunization: Secondary | ICD-10-CM

## 2019-09-13 NOTE — Progress Notes (Signed)
   Covid-19 Vaccination Clinic  Name:  Larry Briggs    MRN: [id number] DOB: 1936/07/13  09/13/2019  [name] was observed post Covid-19 immunization for 15 minutes without incidence. He was provided with Vaccine Information Sheet and instruction to access the V-Safe system.   [name] was instructed to call 911 with any severe reactions post vaccine: Marland Kitchen Difficulty breathing  . Swelling of your face and throat  . A fast heartbeat  . A bad rash all over your body  . Dizziness and weakness    Immunizations Administered    Name Date Dose VIS Date Route   Pfizer COVID-19 Vaccine 09/13/2019 10:30 AM 0.3 mL 07/13/2019 Intramuscular   Manufacturer: [employer]   Lot: [id number]   [place]: [id number]

## 2019-09-18 DIAGNOSIS — J342 Deviated nasal septum: Secondary | ICD-10-CM | POA: Diagnosis not present

## 2019-09-18 DIAGNOSIS — Z7901 Long term (current) use of anticoagulants: Secondary | ICD-10-CM | POA: Diagnosis not present

## 2019-09-18 DIAGNOSIS — J3489 Other specified disorders of nose and nasal sinuses: Secondary | ICD-10-CM | POA: Diagnosis not present

## 2019-09-18 DIAGNOSIS — R040 Epistaxis: Secondary | ICD-10-CM | POA: Diagnosis not present

## 2019-10-05 ENCOUNTER — Other Ambulatory Visit: Payer: Self-pay | Admitting: Cardiology

## 2019-10-15 DIAGNOSIS — H5203 Hypermetropia, bilateral: Secondary | ICD-10-CM | POA: Diagnosis not present

## 2019-10-15 DIAGNOSIS — H2513 Age-related nuclear cataract, bilateral: Secondary | ICD-10-CM | POA: Diagnosis not present

## 2019-10-22 ENCOUNTER — Other Ambulatory Visit: Payer: Self-pay | Admitting: *Deleted

## 2019-10-22 NOTE — Patient Outreach (Signed)
Quarterly telephone outreach for COPD, also following up on epistaxis work up.  Larry Briggs reports he is doing well. His quality of life has improved since he stated using Trelegy Ellipta for COPD management. He has minimal sxs and has not had any ED or hospitalization for COPD. He was not currently eligible for pharmacy assistance.  He went to the ENT for workup of his frequent episodes of epistaxis. He had a perforated septal area that was the source of bleeding. He was started on a moisturizing nasal spray. His ASA was stopped. He has only one one episode and that was early in the tx.  Reviewed the benefits of the Trelegy and Larry Briggs in reguard to quality of life and stroke prevention.  Encouraged pt to call me if he has any questions or problems. I will check in with him in 3 months.  Larry Briggs. Myrtie Neither, MSN, [facility] Gerontological Nurse Practitioner [facility] [phone]

## 2019-11-12 ENCOUNTER — Telehealth: Payer: Self-pay | Admitting: Internal Medicine

## 2019-11-12 NOTE — Telephone Encounter (Signed)
Instructions from last OV with Dr. Tamala Julian    Return in about 3 months (around 11/18/2019). - Can try going off singulair, if cough and nose drainage gets worse, start taking it again - Continue omeprazole - Continue trelegy - Telephone visit in 3 months, call sooner if doing unwell - Get COVID vaccine     Called and spoke with pt's wife Manuela Schwartz letting her know the info stated in last OV with Dr. Tamala Julian and she verbalized understanding. I have scheduled pt a televisit Lesle Chris 4/12. Nothing further needed.

## 2019-11-19 NOTE — Progress Notes (Signed)
Virtual Visit via Telephone Note  I connected with Larry Briggs on 11/20/19 at 10:30 AM EDT by telephone and verified that I am speaking with the correct person using two identifiers.  Location: Patient: Home Provider: Office [facility] - [id number], [place], [place], [place] [address]   I discussed the limitations, risks, security and privacy concerns of performing an evaluation and management service by telephone and the availability of in person appointments. I also discussed with the patient that there may be a patient responsible charge related to this service. The patient expressed understanding and agreed to proceed.  Patient consented to consult via telephone: Yes People present and their role in pt care: Pt     History of Present Illness:  84 year old male former smoker followed in our office for COPD and pulmonary nodule  PMH: A. fib (on Xarelto) Smoker/ Smoking History: Former smoker.  40 pack years. Maintenance: Trelegy Ellipta  Pt of: Dr. Tamala Julian  Chief complaint: 3 month follow up    84 year old male former smoker last seen in our office in January/2021 by Dr. Tamala Julian.  At that time patient was status post treatment with Levaquin for Pseudomonas that was pansensitive.  He was found to have a lung nodule of 6 mm.  Stable on imaging.  Plan to follow this for the next 2 years.  Financial limitations on inhaler prescriptions.  He was recommended to remain on Trelegy Ellipta.  He was trialed off of Singulair.  Plan for him to have a 58-month televisit as well as a 3-month follow-up in person.  He was encouraged to get the Covid vaccine.  Plan for a CT chest in January/2022 for nodule follow-up.  Patient reporting today that he is doing well since last being seen.  He continues to be maintained on Trelegy Ellipta.  This inhaler still is expensive.  He was previously evaluated in 2020 by [facility] and found to not be a candidate for [place] for you.  We  will further evaluate and discuss this today.  Patient is still off of Singulair at this time.  He is having some occasional allergies.  He is considering restarting use.  We will discuss this today.  Patient is already doing nasal saline rinses.  We will plan on doing a repeat CT chest in January/2021.  Observations/Objective:  02/20/2019-CT chest without contrast-stable 6 mm nodule over the right lower lobe adjacent to the diaphragm, recommend 1 additional follow-up in 18 months to document 2 years of stability, arthrosclerosis, mild centrilobular and paraseptal emphysema  07/05/2019-respiratory sputum-Pseudomonas, pansensitive  Social History   Tobacco Use  Smoking Status Former Smoker  . Packs/day: 0.50  . Years: 40.00  . Pack years: 20.00  . Types: Cigarettes  . Quit date: 07/02/1994  . Years since quitting: 25.4  Smokeless Tobacco Never Used   Immunization History  Administered Date(s) Administered  . Fluad Quad(high Dose 65+) 05/18/2019  . Influenza Inj Mdck Quad Pf 04/20/2018  . Influenza Split 04/16/2016  . Influenza, High Dose Seasonal PF 05/16/2015  . PFIZER SARS-COV-2 Vaccination 08/23/2019, 09/13/2019  . Pneumococcal Conjugate-13 06/14/2014  . Zoster 06/07/2006  . Zoster Recombinat (Shingrix) 05/24/2018   Assessment and Plan:  COPD GOLD 4  Plan:  Continue Trelegy Ellipta  amb ref to [facility] for inhaler costs  Continue saba as needed  Remain active  Repeat CT chest in Jan /2022   Abnormal findings on diagnostic imaging of lung 19mm RLL nodule stable  Plan: Repeat  CT of chest in 18 months - Jan/2022   Medication management Plan: Could consider trial of trelegy Ellipta inhaler  Pseudomonas aeruginosa infection History of Pseudomonas infection in December/2020 Treated with Levaquin  Plan: Continue to clinically monitor   Follow Up Instructions:  Return in about 4 months (around 03/21/2020), or if symptoms worsen or fail to improve, for Follow up  with Dr. Tamala Julian.   I discussed the assessment and treatment plan with the patient. The patient was provided an opportunity to ask questions and all were answered. The patient agreed with the plan and demonstrated an understanding of the instructions.   The patient was advised to call back or seek an in-person evaluation if the symptoms worsen or if the condition fails to improve as anticipated.  I provided 24 minutes of non-face-to-face time during this encounter.   Lauraine Rinne, NP

## 2019-11-20 ENCOUNTER — Other Ambulatory Visit: Payer: Self-pay

## 2019-11-20 ENCOUNTER — Encounter: Payer: Self-pay | Admitting: Pulmonary Disease

## 2019-11-20 ENCOUNTER — Ambulatory Visit (INDEPENDENT_AMBULATORY_CARE_PROVIDER_SITE_OTHER): Payer: Medicare Other | Admitting: Pulmonary Disease

## 2019-11-20 DIAGNOSIS — A498 Other bacterial infections of unspecified site: Secondary | ICD-10-CM

## 2019-11-20 DIAGNOSIS — J449 Chronic obstructive pulmonary disease, unspecified: Secondary | ICD-10-CM

## 2019-11-20 DIAGNOSIS — R918 Other nonspecific abnormal finding of lung field: Secondary | ICD-10-CM

## 2019-11-20 DIAGNOSIS — Z79899 Other long term (current) drug therapy: Secondary | ICD-10-CM

## 2019-11-20 NOTE — Assessment & Plan Note (Signed)
Plan: Could consider trial of trelegy Ellipta inhaler

## 2019-11-20 NOTE — Patient Instructions (Addendum)
You were seen today by Lauraine Rinne, NP  for:   1. COPD GOLD 4   - AMB Referral to [place] Management  Trelegy Ellipta  >>> 1 puff daily in the morning >>>rinse mouth out after use  >>> This inhaler contains 3 medications that help manage her respiratory status, contact our office if you cannot afford this medication or unable to remain on this medication  Only use your albuterol as a rescue medication to be used if you can't catch your breath by resting or doing a relaxed purse lip breathing pattern.  - The less you use it, the better it will work when you need it. - Ok to use up to 2 puffs  every 4 hours if you must but call for immediate appointment if use goes up over your usual need - Don't leave home without it !!  (think of it like the spare tire for your car)   Note your daily symptoms > remember "red flags" for COPD:   >>>Increase in cough >>>increase in sputum production >>>increase in shortness of breath or activity  intolerance.   If you notice these symptoms, please call the office to be seen.    2. Abnormal findings on diagnostic imaging of lung  Repeat CT in Jan / 2021   3. Medication management  - AMB Referral to [place] Management   We recommend today:  Orders Placed This Encounter  Procedures  . AMB Referral to [place] Management    Referral Priority:   Routine    Referral Type:   Consultation    Referral Reason:   [facility]-Care Management    Number of Visits Requested:   1   Orders Placed This Encounter  Procedures  . AMB Referral to [place] Management   No orders of the defined types were placed in this encounter.   Follow Up:    Return in about 4 months (around 03/21/2020), or if symptoms worsen or fail to improve, for Follow up with Dr. Tamala Julian.   Please do your part to reduce the spread of COVID-19:      Reduce your risk of any infection  and COVID19 by using the similar precautions used for avoiding the common cold or flu:  Marland Kitchen Wash your  hands often with soap and warm water for at least 20 seconds.  If soap and water are not readily available, use an alcohol-based hand sanitizer with at least 60% alcohol.  . If coughing or sneezing, cover your mouth and nose by coughing or sneezing into the elbow areas of your shirt or coat, into a tissue or into your sleeve (not your hands). Langley Gauss A MASK when in public  . Avoid shaking hands with others and consider head nods or verbal greetings only. . Avoid touching your eyes, nose, or mouth with unwashed hands.  . Avoid close contact with people who are sick. . Avoid places or events with large numbers of people in one location, like concerts or sporting events. . If you have some symptoms but not all symptoms, continue to monitor at home and seek medical attention if your symptoms worsen. . If you are having a medical emergency, call 911.   [place] / e-Visit: [email or web address]         [facility]: [facility]: [phone]  [facility]: [phone]     It is flu season:   >>> Best ways to protect herself from the flu: Receive the yearly flu vaccine, practice good hand hygiene washing with soap and also using hand sanitizer when available, eat a nutritious meals, get adequate rest, hydrate appropriately   Please contact the office if your symptoms worsen or you have concerns that you are not improving.   Thank you for choosing [facility] for your healthcare, and for allowing [place] to partner with you on your healthcare journey. I am thankful to be able to provide care to you today.   Wyn Quaker FNP-C

## 2019-11-20 NOTE — Assessment & Plan Note (Signed)
Plan:  Continue Trelegy Ellipta  amb ref to [facility] for inhaler costs  Continue saba as needed  Remain active  Repeat CT chest in Jan /2022

## 2019-11-20 NOTE — Assessment & Plan Note (Signed)
39mm RLL nodule stable  Plan: Repeat CT of chest in 18 months - Jan/2022

## 2019-11-20 NOTE — Assessment & Plan Note (Signed)
History of Pseudomonas infection in December/2020 Treated with Levaquin  Plan: Continue to clinically monitor

## 2019-11-26 ENCOUNTER — Other Ambulatory Visit: Payer: Self-pay | Admitting: *Deleted

## 2019-11-26 NOTE — Patient Outreach (Signed)
[facility]) Care Management  11/26/2019  DAYON FEEHAN 01/03/1936 [id number]   Received another request from Wyn Quaker to see if pt will now qualify for a Trelegy or [place] assistance program.  Called Mr. Toye and asked what has changed since December. He says he is in the donut hole now.   Reviewed previous notes from [facility], PharmD, which reports he was not eligible of there were unknown factors that left it unsure if he could qualify.  Advised I would send another referral but advised if he did not qualify in Dec. He probably would not now either.  He does report the Trelegy has really helped him breathe much easier. He would certainly like to continue this medication.   I reminded him that we have a quarterly phone call set up for the end of June.  Eulah Pont. Myrtie Neither, MSN, [facility] Gerontological Nurse Practitioner [facility] Care Management [phone]

## 2019-11-28 DIAGNOSIS — H2511 Age-related nuclear cataract, right eye: Secondary | ICD-10-CM | POA: Diagnosis not present

## 2019-11-28 DIAGNOSIS — H25811 Combined forms of age-related cataract, right eye: Secondary | ICD-10-CM | POA: Diagnosis not present

## 2019-11-30 ENCOUNTER — Ambulatory Visit: Payer: Self-pay | Admitting: Pharmacist

## 2019-12-12 ENCOUNTER — Other Ambulatory Visit: Payer: Self-pay | Admitting: Cardiology

## 2019-12-26 DIAGNOSIS — H2512 Age-related nuclear cataract, left eye: Secondary | ICD-10-CM | POA: Diagnosis not present

## 2019-12-29 NOTE — Progress Notes (Signed)
Larry Briggs Date of Birth: 1936-03-29 Medical Record [id number]  History of Present Illness: Larry Briggs is seen back today for follow up CAD. He has a known history of coronary disease and status post coronary bypass surgery in 1995. He underwent stenting of the left main coronary in 2004. His last cardiac catheterization in April 2012 showed that everything was patent except for a vein graft to the distal right coronary. The PDA was supplied by left to right collaterals. He has been followed in our HTN clinic. He was having side effects on medications. This improved with stopping lisinopril and doxazosin and starting on valsartan. He does have severe COPD. His valsartan was later switched to irbesartan and then this was later discontinued.   He was  admitted in February with left hip fracture after a mechanical fall.  He underwent intramedullary nail in the left intertrochanteric hip.  EKG showed with new T wave inversion in anterolateral leads in the setting of tachycardia and anemia.  Cardiology was consulted for abnormal EKG.  Patient however denied any anginal symptom.  Echocardiogram obtained on 09/07/2017 showed EF 50-55%, grade 1 DD, trivial pericardial effusion.  Overall poor image.  Serial troponins were negative.  Suspicion for angina was low.  Overnight, he went into atrial fibrillation with RVR which was new.  He was not a anticoagulation candidate given significant bleeding risk after surgery.  The plan was to address anticoagulation therapy on follow-up. He subsequently converted to NSR. 30 day event monitor was recommended to see if he had any further Afib. This did demonstrate paroxysmal Afib and he was started on Xarelto. ASA was discontinued.   On follow up today he is doing very well from a cardiac standpoint. He states his breathing is much better on Trelegy.  No edema or palpitations. No chest pain. He bleeding on Xarelto.  Weight is stable.    Current Outpatient Medications on  File Prior to Visit  Medication Sig Dispense Refill  . acetaminophen (TYLENOL) 650 MG CR tablet Take 650 mg by mouth every 8 (eight) hours as needed.    Marland Kitchen Bioflavonoid Products (ESTER-C) 500-550 MG TABS Take 500 mg by mouth daily.    . chlorthalidone (HYGROTON) 25 MG tablet Take 1 tablet (25 mg total) by mouth at bedtime. Please make annual appt with Dr. Martinique for further refills. [phone]. 1st attempt. 90 tablet 3  . docusate sodium (COLACE) 100 MG capsule Take 100 mg by mouth as needed.    . doxylamine, Sleep, (UNISOM) 25 MG tablet Take 25 mg by mouth at bedtime as needed.    . fish oil-omega-3 fatty acids 1000 MG capsule Take 500 mg by mouth daily.     Marland Kitchen guaiFENesin (MUCINEX) 600 MG 12 hr tablet Take by mouth 2 (two) times daily as needed.    . metoprolol succinate (TOPROL-XL) 50 MG 24 hr tablet TAKE 1 AND 1/2 TABLETS AT BEDTIME 135 tablet 1  . nitroGLYCERIN (NITROSTAT) 0.4 MG SL tablet Place 1 tablet (0.4 mg total) under the tongue as needed. 75 tablet 3  . Respiratory Therapy Supplies (FLUTTER) DEVI Use as directed 1 each 0  . simvastatin (ZOCOR) 20 MG tablet Take 1 tablet (20 mg total) by mouth at bedtime. Please make annual appt with Dr. Martinique for further refills. [phone]. 1st attempt. 90 tablet 3  . tamsulosin (FLOMAX) 0.4 MG CAPS capsule Take 0.4 mg by mouth daily after supper.    . TRELEGY ELLIPTA 100-62.5-25 MCG/INH AEPB Place 1 Inhaler into  the nose daily.    . VENTOLIN HFA 108 (90 Base) MCG/ACT inhaler Inhale 2 puffs into the lungs every 4 (four) hours as needed for wheezing or shortness of breath. 18 g 5  . XARELTO 20 MG TABS tablet TAKE 1 TABLET (20 MG TOTAL) BY MOUTH DAILY WITH SUPPER. 90 tablet 1   No current facility-administered medications on file prior to visit.    Allergies  Allergen Reactions  . Iodinated Diagnostic Agents Nausea And Vomiting and Rash    Past Medical History:  Diagnosis Date  . CAD (coronary artery disease)    Remote MI in 1980. CABG  1995. Stent to left main in 2004. Last cath in April of 2012. EF 50%; patent LIMA to DX/LAD with collateralization of the distal right, patent SVG to OM, occluded SVG to distal RCA, and patent stent to LAD  . History of heart attack 1980  . Hypercholesteremia   . Hypertension   . Increased glucose level   . Obesity     Past Surgical History:  Procedure Laterality Date  . CARDIAC CATHETERIZATION  1993  . CARDIAC CATHETERIZATION  April 2012   Mild reduction if EF at 50%. Patent LIMA to DX/LAD with collateralization to distal RCA, patent SVG to OM and occluded SVG to distal right and patent stent to LAD/Left main;  . CORONARY ARTERY BYPASS GRAFT  05/1994   x5 LIMA to LAD & DX, SVG to LCX, SVG to RCA  . CORONARY STENT PLACEMENT  2004   Stent to the L main  . INTRAMEDULLARY (IM) NAIL INTERTROCHANTERIC Left 09/05/2017   Procedure: INTRAMEDULLARY (IM) NAIL INTERTROCHANTRIC;  Surgeon: Renette Butters, MD;  Location: [place];  Service: Orthopedics;  Laterality: Left;    Social History   Tobacco Use  Smoking Status Former Smoker  . Packs/day: 0.50  . Years: 40.00  . Pack years: 20.00  . Types: Cigarettes  . Quit date: 07/02/1994  . Years since quitting: 25.5  Smokeless Tobacco Never Used    Social History   Substance and Sexual Activity  Alcohol Use No    Family History  Problem Relation Age of Onset  . Heart attack Father   . Hypertension Father   . Stroke Mother     Review of Systems: The review of systems is per the HPI.  All other systems were reviewed and are negative.  Physical Exam: BP (!) 142/78   Pulse 64   Temp (!) 97.1 F (36.2 C)   Ht 5\' 10"  (1.778 m)   Wt 173 lb (78.5 kg)   SpO2 97%   BMI 24.82 kg/m  GENERAL:  Well appearing elderly WM in NAD HEENT:  PERRL, EOMI, sclera are clear. Oropharynx is clear. NECK:  No jugular venous distention, carotid upstroke brisk and symmetric, no bruits, no thyromegaly or adenopathy LUNGS:  Clear. Decreased BS CHEST:   Unremarkable HEART:  RRR with extrasystoles,  PMI not displaced or sustained,S1 and S2 within normal limits, no S3, no S4: no clicks, no rubs, no murmurs ABD:  Soft, nontender. BS +, no masses or bruits. No hepatomegaly, no splenomegaly EXT:  2 + pulses throughout, no edema, no cyanosis no clubbing SKIN:  Warm and dry.  No rashes NEURO:  Alert and oriented x 3. Cranial nerves II through XII intact. PSYCH:  Cognitively intact      LABORATORY DATA:   Lab Results  Component Value Date   WBC 8.5 09/16/2017   HGB 11.5 (L) 09/16/2017   HCT 34.3 (  L) 09/16/2017   PLT 358 09/16/2017   GLUCOSE 135 (H) 09/09/2017   CHOL  11/14/2010    112        ATP III CLASSIFICATION:  <200     mg/dL   Desirable  200-239  mg/dL   Borderline High  >=240    mg/dL   High          TRIG 108 11/14/2010   HDL 40 11/14/2010   LDLCALC  11/14/2010    50        Total Cholesterol/HDL:CHD Risk Coronary Heart Disease Risk Table                     Men   Women  1/2 Average Risk   3.4   3.3  Average Risk       5.0   4.4  2 X Average Risk   9.6   7.1  3 X Average Risk  23.4   11.0        Use the calculated Patient Ratio above and the CHD Risk Table to determine the patient's CHD Risk.        ATP III CLASSIFICATION (LDL):  <100     mg/dL   Optimal  100-129  mg/dL   Near or Above                    Optimal  130-159  mg/dL   Borderline  160-189  mg/dL   High  >190     mg/dL   Very High   ALT 17 09/04/2017   AST 25 09/04/2017   NA 135 09/09/2017   K 4.1 09/09/2017   CL 99 (L) 09/09/2017   CREATININE 1.08 09/09/2017   BUN 20 09/09/2017   CO2 25 09/09/2017   TSH 0.800 09/06/2017   INR 1.05 09/04/2017   Labs reviewed from 07/22/16: cholesterol 174, triglycerides 140, LDL 82, HDL 64. CMET normal Except creatinine reported as 242 (this has to be an error).  Dated 01/25/17: A1c 5.9%.  Dated 07/27/17: cholesterol 146, triglycerides 104, HDL 56, LDL 69.  Dated 08/03/18: cholesterol 151, triglycerides 102, HDL  58, LDL 73. CMET normal Dated 08/08/19: Triglycerides 131, HDL 64.    Echo 09/07/17: Study Conclusions  - Left ventricle: The cavity size was normal. Wall thickness was   increased in a pattern of mild LVH. Systolic function was normal.   The estimated ejection fraction was in the range of 50% to 55%.   Doppler parameters are consistent with abnormal left ventricular   relaxation (grade 1 diastolic dysfunction). - Aortic valve: Mildly calcified annulus. - Right atrium: The atrium was mildly dilated. - Pericardium, extracardiac: A trivial pericardial effusion was   identified.  Impressions:  - Technically difficult echo   Image quality is poor.  Event monitor 09/26/17: Study Highlights    Normal sinus rhythm  Atrial fibrillation- paroxysmal with controlled ventricular response  Isolated PVCs       Assessment / Plan: 1. CAD s/p CABG 1995. S/p stenting of the left main coronary artery 2004.  Cath 2012 showed occlusion of SVG to diagonal. He remains asymptomatic. Continue current anti-anginal Rx.   2. Paroxysmal Afib. Documented recurrence on event monitor. Asymptomatic. Now on Xarelto for anticoagulation. Tolerating it well.   3.  HTN- well controlled.   4. Hyperlipidemia.  Continue statin and fish oil. At goal.  5. Severe COPD with recurrent bronchitis. - followed by pulmonary. Improved on Trelegy  Follow up in  6 months.

## 2020-01-04 ENCOUNTER — Other Ambulatory Visit: Payer: Self-pay

## 2020-01-04 ENCOUNTER — Encounter: Payer: Self-pay | Admitting: Cardiology

## 2020-01-04 ENCOUNTER — Ambulatory Visit (INDEPENDENT_AMBULATORY_CARE_PROVIDER_SITE_OTHER): Payer: Medicare Other | Admitting: Cardiology

## 2020-01-04 VITALS — BP 142/78 | HR 64 | Temp 97.1°F | Ht 70.0 in | Wt 173.0 lb

## 2020-01-04 DIAGNOSIS — J449 Chronic obstructive pulmonary disease, unspecified: Secondary | ICD-10-CM

## 2020-01-04 DIAGNOSIS — I10 Essential (primary) hypertension: Secondary | ICD-10-CM

## 2020-01-04 DIAGNOSIS — I25810 Atherosclerosis of coronary artery bypass graft(s) without angina pectoris: Secondary | ICD-10-CM

## 2020-01-04 DIAGNOSIS — I480 Paroxysmal atrial fibrillation: Secondary | ICD-10-CM | POA: Diagnosis not present

## 2020-01-04 DIAGNOSIS — E7800 Pure hypercholesterolemia, unspecified: Secondary | ICD-10-CM

## 2020-01-29 ENCOUNTER — Other Ambulatory Visit: Payer: Self-pay | Admitting: *Deleted

## 2020-01-29 NOTE — Patient Outreach (Signed)
[facility]) Care Management  01/29/2020  Larry Briggs [birth year] [id number]  Quarterly telephone outreach f/u for COPD and CAD  Chart review reveals no ED visits or hospitalizations. Pt saw his cardiologist 01/04/20. Will see provider at pulmonology in August.  Larry Briggs says he is doing great! No breathing issues, no complaints of any kind. The Trelegy inhaler works very well.  He voices that he feels like I do not need to check in with him as frequently since he is doing so well.  Next call scheduled for Sept. 28th at 9:00 am.  [facility] CM Care Plan Problem One     Most Recent Value  Care Plan Problem One COPD not optimally controlled.  Role Documenting the Problem One Care Management Coordinator  Care Plan for Problem One Active  [facility] Long Term Goal  Pt will continue to follow his COPD Action plan over the next 90 days as evidenced by no hospitalizations for COPD.  [facility] Long Term Goal Start Date 01/29/20  [facility] Long Term Goal Met Date 01/29/20  [Short term goal was met within the 31 days per chart review.]  Interventions for Problem One Long Term Goal Pt is well controlled at this time and we will reduce the frequency of our calls to every 3 months. Pt to alert MD if breathing problems are not relieved by following his action plan for early intervention.  [facility] CM Short Term Goal #1  Pt will attend pulmonology appt on 03/10/20.  [facility] CM Short Term Goal #1 Start Date 01/29/20  [facility] CM Short Term Goal #1 Met Date 07/12/19  Interventions for Short Term Goal #1 Encouraged continual regularly scheduled visits with his pulmonologist.  [facility] CM Short Term Goal #2  Pt will work with [place] Assistant to complete application for possible assistance for his medication  [facility] CM Short Term Goal #2 Start Date 07/05/19  Interventions for Short Term Goal #2 Pt has been able to get his Trelegy inhaler himself.     Eulah Pont. Myrtie Neither, MSN, GNP-BC Gerontological Nurse Practitioner [facility]  Care Management [phone]  Sent note to Dr. Shelia Media for update.

## 2020-02-07 ENCOUNTER — Encounter: Payer: Self-pay | Admitting: Internal Medicine

## 2020-03-03 ENCOUNTER — Telehealth: Payer: Self-pay | Admitting: Internal Medicine

## 2020-03-03 MED ORDER — PREDNISONE 10 MG PO TABS
40.0000 mg | ORAL_TABLET | Freq: Every day | ORAL | 0 refills | Status: AC
Start: 2020-03-03 — End: 2020-03-08

## 2020-03-03 MED ORDER — LEVOFLOXACIN 750 MG PO TABS
750.0000 mg | ORAL_TABLET | Freq: Every day | ORAL | 0 refills | Status: DC
Start: 2020-03-03 — End: 2020-05-07

## 2020-03-03 NOTE — Telephone Encounter (Signed)
Spoke with patient's wife, Manuela Schwartz, listed on DPR, relayed information from Dr. Silas Flood.  Prednisone and Levofloxacin sent to the pharmacy, verified with patient's wife.  She verbalized understanding.  Nothing further needed.

## 2020-03-03 NOTE — Telephone Encounter (Signed)
Called and spoke with patient, instructed on use and cleaning of the flutter valve.  Nothing further needed.

## 2020-03-03 NOTE — Telephone Encounter (Signed)
Severe COPD based on prior PFTs. Increase cough, congestion. Fortunately, no other worsening symptoms per prior discussion with wife. Possible viral URI. However, given severe airflow limitation/COPD with increase cough and sputum, recommend treating for COPD exacerbation. Prior h/o pan-sensitive Psa.  Please provide prednisone 40 mg daily x 5 days and levofloxacin 750 mg tablet daily for 7 days.

## 2020-03-03 NOTE — Telephone Encounter (Signed)
Spoke with patient's wife, Manuela Schwartz, listed on DPR.  Reports cough with congestion in chest, clear when he is able to get it up.  Started Musinex on Sunday.  Yesterday, he coughed so hard that he vomited and did not have an appetite all the rest of the day.  He has a flutter valve and has been encouraged to use it.  He also has a runny nose.  His wife reports this started after he slept under the [facility] and has been coughing ever since.  Denies any fever or sob.  He has a follow up with Dr. Silas Flood on 03/26/20, he is a former Dr. Tamala Julian patient.  Dr. Silas Flood, please advise.  Thank you.

## 2020-03-07 ENCOUNTER — Telehealth: Payer: Self-pay | Admitting: Cardiology

## 2020-03-07 NOTE — Telephone Encounter (Signed)
Spoke to pt who report while visiting his son, he granddaughter became sick and was dx with an upper respiratory infection. Since returning home, he started developing symptoms of SOB and nasal congestion. He was seen by his pulmonologist who dx him also with an upper respiratory infection and prescribed abx and prednisone.  Pt voiced he feels he is now having issues with his afib. He voiced he can't do anything strenuous without becoming SOB. He denies feeling like his heart is racing or symptoms of palpation/flutter. Pt said he just remember  Dr. Martinique telling him infections can cause his afib to act up. Pt also denies any swelling.   Nurse advised pt to contact pulmonologist as symptoms could be related to his upper respiratory infection but will still send a message to Dr. Martinique for any further recommendations.

## 2020-03-07 NOTE — Telephone Encounter (Signed)
Patient c/o Palpitations:  High priority if patient c/o lightheadedness, shortness of breath, or chest pain  1) How long have you had palpitations/irregular HR/ Afib? Since the weekend.  Are you having the symptoms now? Yes   2) Are you currently experiencing lightheadedness, SOB or CP? SOB   3) Do you have a history of afib (atrial fibrillation) or irregular heart rhythm? Yes  4) Have you checked your BP or HR? (document readings if available): no, has not been checked   5) Are you experiencing any other symptoms? No   He does have an upper respiratory infection.

## 2020-03-07 NOTE — Telephone Encounter (Signed)
Spoke to patient's wife advised Dr.Jordan is out of office today.I will call you back on Monday with his advice.

## 2020-03-09 NOTE — Telephone Encounter (Signed)
He does have paroxysmal AFib and an upper respiratory illness could trigger this. As long as his HR is controlled then we should wait and see how he responds to his antibiotics and steroids. It symptoms do not improve he should follow up with pulmonary and they could do an Ecg.   Murphy Bundick Martinique MD, [facility]

## 2020-03-10 ENCOUNTER — Ambulatory Visit: Payer: Medicare Other | Admitting: Internal Medicine

## 2020-03-10 ENCOUNTER — Telehealth: Payer: Self-pay | Admitting: Pulmonary Disease

## 2020-03-10 NOTE — Telephone Encounter (Signed)
Please see previous encounter. Closing this one

## 2020-03-10 NOTE — Telephone Encounter (Signed)
ATC patient.  LMTCB. 

## 2020-03-10 NOTE — Telephone Encounter (Signed)
Called and spoke with patient's wife per DPR states he would like to be seen sooner if possible due to him just not feeling well and persistent cough. Patient appointment has been moved up for Dr. Silas Flood. Nothing further needed at this time.

## 2020-03-12 ENCOUNTER — Ambulatory Visit: Payer: Medicare Other | Admitting: Pulmonary Disease

## 2020-03-12 ENCOUNTER — Telehealth: Payer: Self-pay | Admitting: Pulmonary Disease

## 2020-03-12 NOTE — Telephone Encounter (Signed)
Patient called back and is now rescheduled with Aaron Edelman on 03/21/20 at 4:30pm for cough. Patient still needs an appointment to get established with new provider. Nothing further needed at this time

## 2020-03-12 NOTE — Telephone Encounter (Signed)
ATC patient LMTCB his appointment for today needs to be rescheduled to a different day. He was scheduled in a blocked slot. Looks like next available may be 04/15/20 unless Dr. Erin Fulling has something sooner.

## 2020-03-13 ENCOUNTER — Telehealth: Payer: Self-pay | Admitting: Pulmonary Disease

## 2020-03-13 NOTE — Telephone Encounter (Signed)
Called and spoke with [name] who who states that pt has complaints of cough x2 weeks that he still is unable to get rid of. Pt is not coughing up any phlegm but does have some congestion.  Pt does not have any complaints of wheezing and also deny any complaints of fever.  Pt has had to use the ventolin inhaler at least twice daily which pt states he is unsure if it has helped. Pt is using the  Trelegy inhaler as prescribed. Asked if pt was using the Flutter valve and she stated that pt has not been using it. Pt has been taking mucinex.  Pt has not tried any OTC cough meds.  Pt and wife Larry Briggs want to know if something could be prescribed for pt to help with his cough prior to upcoming appt on 8/20.  Tammy, please advise.

## 2020-03-13 NOTE — Telephone Encounter (Signed)
Patient contacted with recommendations from NP Tammy Parrett. He reports that he is feeling better with less cough and other symptoms. He verbalized understanding of the treatment plan. He will call our office if symptoms to not resolve.

## 2020-03-13 NOTE — Telephone Encounter (Signed)
Would try to begin Flutter valve Three times a day  , take mucinex  Drink water  Try albuterol inhaler  May need to be seen or go to urgent care if not helping  Does not have neb machine but would not want to rx if not seen   Please contact office for sooner follow up if symptoms do not improve or worsen or seek emergency care

## 2020-03-14 NOTE — Telephone Encounter (Signed)
Spoke to patient he stated he is feeling better.Dr.Jordan's advice given.Stated he has appointment with pulmonary 8/20 with Wyn Quaker PA.Advised to keep appointment as planned with Dr.Jordan and call sooner if needed.

## 2020-03-20 ENCOUNTER — Telehealth: Payer: Self-pay | Admitting: Pulmonary Disease

## 2020-03-20 NOTE — Telephone Encounter (Signed)
Called and spoke with patient letting him know that there was no new messages from our office except the last encounter on 03/13/20. Patient confirmed appointment for tomorrow. Nothing further needed at this time.

## 2020-03-21 ENCOUNTER — Other Ambulatory Visit: Payer: Self-pay

## 2020-03-21 ENCOUNTER — Ambulatory Visit (INDEPENDENT_AMBULATORY_CARE_PROVIDER_SITE_OTHER): Payer: Medicare Other | Admitting: Pulmonary Disease

## 2020-03-21 ENCOUNTER — Encounter: Payer: Self-pay | Admitting: Pulmonary Disease

## 2020-03-21 VITALS — BP 120/72 | HR 66 | Temp 97.3°F | Ht 70.0 in | Wt 166.0 lb

## 2020-03-21 DIAGNOSIS — R918 Other nonspecific abnormal finding of lung field: Secondary | ICD-10-CM | POA: Diagnosis not present

## 2020-03-21 DIAGNOSIS — J449 Chronic obstructive pulmonary disease, unspecified: Secondary | ICD-10-CM

## 2020-03-21 DIAGNOSIS — A498 Other bacterial infections of unspecified site: Secondary | ICD-10-CM

## 2020-03-21 NOTE — Assessment & Plan Note (Signed)
Recently treated with Levaquin  Do not believe patient needs additional antibiotics at this time Patient with small productive cough with clear sputum No fevers We will need to continue to clinically monitor Patient knows to contact our office if he starts to have increased sputum production

## 2020-03-21 NOTE — Progress Notes (Signed)
@Patient  ID: Larry Briggs, male    DOB: [birth year], 84 y.o.   MRN: [id number]  Chief Complaint  Patient presents with  . Follow-up    pt granddaughter had uri and pssed to her grandfather    Referring provider: [name], MD  HPI:  84 year old male former smoker followed in our office for COPD and pulmonary nodule  PMH: A. fib (on Xarelto) Smoker/ Smoking History: Former smoker.  40 pack years. Maintenance: Trelegy Ellipta  Pt of: Dr. Tamala Julian  03/21/2020  - Visit   84 year old male former smoker presenting to our office today for follow-up.  He is followed in our office for COPD.  He is followed by Dr. Tamala Julian.  Last seen in our office telephonically in April/2021.  Plan of care at that office visit was to remain on Trelegy Ellipta, repeat a CT chest to follow 6 mm right lower lobe pulmonary nodule in January/2022 history of pansensitive Pseudomonas infection and was treated with Levaquin.  Patient contacted our office on 03/03/2020 reporting acute worsened symptoms he was treated with prednisone for 5 days as well as Levaquin for 7 days by Dr. Silas Flood was recommended by her triage team to start a flutter valve.  Patient reports that since last being treated with Levaquin as well as 5 days of prednisone that his cough has improved, appetite has improved, and patient is having occasional clear sputum.  He is using his flutter valve.  He reports using it 2 times a day around 3 to 4 breaths each time.  Patient is still adherent to Trelegy Ellipta.  Patient is wondering if he needs additional round of prednisone.  Questionaires / Pulmonary Flowsheets:   ACT:  No flowsheet data found.  MMRC: No flowsheet data found.  Epworth:  No flowsheet data found.  Tests:   02/20/2019-CT chest without contrast-stable 6 mm nodule over the right lower lobe adjacent to the diaphragm, recommend 1 additional follow-up in 18 months to document 2 years of stability, arthrosclerosis, mild  centrilobular and paraseptal emphysema  07/05/2019-respiratory sputum-Pseudomonas, pansensitive   FENO:  Lab Results  Component Value Date   NITRICOXIDE 30 06/01/2018    PFT: PFT Results Latest Ref Rng & Units 04/26/2016  FVC-Pre L 2.20  FVC-Predicted Pre % 53  FVC-Post L 2.41  FVC-Predicted Post % 58  Pre FEV1/FVC % % 40  Post FEV1/FCV % % 37  FEV1-Pre L 0.89  FEV1-Predicted Pre % 30  FEV1-Post L 0.90  DLCO uncorrected ml/min/mmHg 13.54  DLCO UNC% % 41  DLCO corrected ml/min/mmHg 14.48  DLCO COR %Predicted % 44  DLVA Predicted % 53  TLC L 8.20  TLC % Predicted % 116  RV % Predicted % 191    WALK:  No flowsheet data found.  Imaging: No results found.  Lab Results:  CBC    Component Value Date/Time   WBC 8.5 09/16/2017 1219   WBC 7.0 09/09/2017 0625   RBC 3.73 (L) 09/16/2017 1219   RBC 3.01 (L) 09/09/2017 0625   HGB 11.5 (L) 09/16/2017 1219   HCT 34.3 (L) 09/16/2017 1219   PLT 358 09/16/2017 1219   MCV 92 09/16/2017 1219   MCH 30.8 09/16/2017 1219   MCH 31.2 09/09/2017 0625   MCHC 33.5 09/16/2017 1219   MCHC 34.7 09/09/2017 0625   RDW 16.2 (H) 09/16/2017 1219   LYMPHSABS 0.7 09/16/2017 1219   MONOABS 0.5 09/04/2017 1047   EOSABS 0.1 09/16/2017 1219   BASOSABS 0.0 09/16/2017 1219  BMET    Component Value Date/Time   NA 135 09/09/2017 0625   K 4.1 09/09/2017 0625   CL 99 (L) 09/09/2017 0625   CO2 25 09/09/2017 0625   GLUCOSE 135 (H) 09/09/2017 0625   BUN 20 09/09/2017 0625   CREATININE 1.08 09/09/2017 0625   CREATININE 0.86 03/09/2016 0926   CALCIUM 8.3 (L) 09/09/2017 0625   GFRNONAA >60 09/09/2017 0625   GFRAA >60 09/09/2017 0625    BNP No results found for: BNP  ProBNP No results found for: PROBNP  Specialty Problems      Pulmonary Problems   COPD GOLD 4     Quit smoking 1995 - Spirometry  04/26/16   FEV1 0.90 (30%)  Ratio 0.37  No resp to saba p ? Prior to study - 07/30/2019  After extensive coaching inhaler device,   effectiveness =    75% (short Ti)        Cough   Acute bronchitis with COPD (HCC)      Allergies  Allergen Reactions  . Iodinated Diagnostic Agents Nausea And Vomiting and Rash    Immunization History  Administered Date(s) Administered  . Fluad Quad(high Dose 65+) 05/18/2019  . Influenza Inj Mdck Quad Pf 04/20/2018  . Influenza Split 04/16/2016  . Influenza, High Dose Seasonal PF 05/16/2015  . PFIZER SARS-COV-2 Vaccination 08/23/2019, 09/13/2019  . Pneumococcal Conjugate-13 06/14/2014  . Zoster 06/07/2006  . Zoster Recombinat (Shingrix) 05/24/2018    Past Medical History:  Diagnosis Date  . CAD (coronary artery disease)    Remote MI in 1980. CABG 1995. Stent to left main in 2004. Last cath in April of 2012. EF 50%; patent LIMA to DX/LAD with collateralization of the distal right, patent SVG to OM, occluded SVG to distal RCA, and patent stent to LAD  . History of heart attack 1980  . Hypercholesteremia   . Hypertension   . Increased glucose level   . Obesity     Tobacco History: Social History   Tobacco Use  Smoking Status Former Smoker  . Packs/day: 0.50  . Years: 40.00  . Pack years: 20.00  . Types: Cigarettes  . Quit date: 07/02/1994  . Years since quitting: 25.7  Smokeless Tobacco Never Used   Counseling given: Not Answered   Continue to not smoke  Outpatient Encounter Medications as of 03/21/2020  Medication Sig  . acetaminophen (TYLENOL) 650 MG CR tablet Take 650 mg by mouth every 8 (eight) hours as needed.  Marland Kitchen Bioflavonoid Products (ESTER-C) 500-550 MG TABS Take 500 mg by mouth daily.  . chlorthalidone (HYGROTON) 25 MG tablet Take 1 tablet (25 mg total) by mouth at bedtime. Please make annual appt with Dr. Martinique for further refills. [phone]. 1st attempt.  . docusate sodium (COLACE) 100 MG capsule Take 100 mg by mouth as needed.  . doxylamine, Sleep, (UNISOM) 25 MG tablet Take 25 mg by mouth at bedtime as needed.  . fish oil-omega-3 fatty acids  1000 MG capsule Take 500 mg by mouth daily.   Marland Kitchen guaiFENesin (MUCINEX) 600 MG 12 hr tablet Take by mouth 2 (two) times daily as needed.  Marland Kitchen levofloxacin (LEVAQUIN) 750 MG tablet Take 1 tablet (750 mg total) by mouth daily.  . metoprolol succinate (TOPROL-XL) 50 MG 24 hr tablet TAKE 1 AND 1/2 TABLETS AT BEDTIME  . nitroGLYCERIN (NITROSTAT) 0.4 MG SL tablet Place 1 tablet (0.4 mg total) under the tongue as needed.  Marland Kitchen Respiratory Therapy Supplies (FLUTTER) DEVI Use as directed  . simvastatin (  ZOCOR) 20 MG tablet Take 1 tablet (20 mg total) by mouth at bedtime. Please make annual appt with Dr. Martinique for further refills. [phone]. 1st attempt.  . tamsulosin (FLOMAX) 0.4 MG CAPS capsule Take 0.4 mg by mouth daily after supper.  . TRELEGY ELLIPTA 100-62.5-25 MCG/INH AEPB Place 1 Inhaler into the nose daily.  . VENTOLIN HFA 108 (90 Base) MCG/ACT inhaler Inhale 2 puffs into the lungs every 4 (four) hours as needed for wheezing or shortness of breath.  [name] 20 MG TABS tablet TAKE 1 TABLET (20 MG TOTAL) BY MOUTH DAILY WITH SUPPER.   No facility-administered encounter medications on file as of 03/21/2020.     Review of Systems  Review of Systems  Constitutional: Positive for appetite change (improved) and fatigue (improved ). Negative for activity change, diaphoresis and fever.  HENT: Positive for congestion (clear). Negative for postnasal drip, sneezing, sore throat and trouble swallowing.   Eyes: Negative.   Respiratory: Positive for cough and shortness of breath. Negative for wheezing.   Cardiovascular: Negative for chest pain and palpitations.  Gastrointestinal: Negative for diarrhea, nausea and vomiting.  Endocrine: Negative.   Genitourinary: Negative.   Musculoskeletal: Negative.  Negative for arthralgias.  Skin: Negative.   Allergic/Immunologic: Negative.   Neurological: Negative.   Hematological: Negative.   Psychiatric/Behavioral: Negative.  Negative for dysphoric mood. The  patient is not nervous/anxious.      Physical Exam  BP 120/72 (BP Location: Right Arm, Patient Position: Sitting, Cuff Size: Normal)   Pulse 66   Temp (!) 97.3 F (36.3 C) (Oral)   Ht 5\' 10"  (1.778 m)   Wt 166 lb (75.3 kg)   SpO2 95%   BMI 23.82 kg/m   Wt Readings from Last 5 Encounters:  03/21/20 166 lb (75.3 kg)  01/04/20 173 lb (78.5 kg)  08/20/19 159 lb 12.8 oz (72.5 kg)  07/30/19 160 lb 6.4 oz (72.8 kg)  07/20/19 160 lb 6.4 oz (72.8 kg)    BMI Readings from Last 5 Encounters:  03/21/20 23.82 kg/m  01/04/20 24.82 kg/m  08/20/19 22.93 kg/m  07/30/19 23.02 kg/m  07/20/19 22.37 kg/m     Physical Exam Vitals and nursing note reviewed.  Constitutional:      General: He is not in acute distress.    Appearance: Normal appearance. He is normal weight.  HENT:     Head: Normocephalic and atraumatic.     Right Ear: Hearing and external ear normal.     Left Ear: Hearing and external ear normal.     Nose: Nose normal. No mucosal edema or rhinorrhea.     Right Turbinates: Not enlarged.     Left Turbinates: Not enlarged.     Mouth/Throat:     Mouth: Mucous membranes are dry.     Pharynx: Oropharynx is clear. No oropharyngeal exudate.  Eyes:     Pupils: Pupils are equal, round, and reactive to light.  Cardiovascular:     Rate and Rhythm: Normal rate and regular rhythm.     Pulses: Normal pulses.     Heart sounds: Normal heart sounds. No murmur heard.   Pulmonary:     Effort: Pulmonary effort is normal.     Breath sounds: No decreased breath sounds, wheezing or rales.     Comments: Diminished breath sounds throughout exam Musculoskeletal:     Cervical back: Normal range of motion.     Right lower leg: No edema.     Left lower leg: No edema.  Lymphadenopathy:  Cervical: No cervical adenopathy.  Skin:    General: Skin is warm and dry.     Capillary Refill: Capillary refill takes less than 2 seconds.     Findings: No erythema or rash.  Neurological:      General: No focal deficit present.     Mental Status: He is alert and oriented to person, place, and time.     Motor: No weakness.     Coordination: Coordination normal.     Gait: Gait is intact. Gait normal.  Psychiatric:        Mood and Affect: Mood normal.        Behavior: Behavior normal. Behavior is cooperative.        Thought Content: Thought content normal.        Judgment: Judgment normal.       Assessment & Plan:   Pseudomonas aeruginosa infection Recently treated with Levaquin  Do not believe patient needs additional antibiotics at this time Patient with small productive cough with clear sputum No fevers We will need to continue to clinically monitor Patient knows to contact our office if he starts to have increased sputum production  COPD GOLD 4  Lungs clear to auscultation today Recently treated with Levaquin as well as 5 days of prednisone Patient with known Pseudomonas infection which was pansensitive  Plan: Continue Trelegy Ellipta Do not believe patient needs additional antibiotics or steroids at this time We will have patient establish care with Dr. Silas Flood and 30-minute time slot   Abnormal findings on diagnostic imaging of lung 6 mm right lower lobe nodule stable  Plan: Repeat CT chest in 18 months-in Jan/2022    Return in about 6 weeks (around 05/02/2020), or if symptoms worsen or fail to improve, for 30 MINUTE SLOT, Follow up with Dr. Silas Flood.   Lauraine Rinne, NP 03/21/2020   This appointment required 32 minutes of patient care (this includes precharting, chart review, review of results, face-to-face care, etc.).

## 2020-03-21 NOTE — Patient Instructions (Addendum)
You were seen today by Lauraine Rinne, NP  for:   1. COPD GOLD 4   Trelegy Ellipta  >>> 1 puff daily in the morning >>>rinse mouth out after use  >>> This inhaler contains 3 medications that help manage her respiratory status, contact our office if you cannot afford this medication or unable to remain on this medication  Note your daily symptoms > remember "red flags" for COPD:   >>>Increase in cough >>>increase in sputum production >>>increase in shortness of breath or activity  intolerance.   If you notice these symptoms, please call the office to be seen.    2. Pseudomonas aeruginosa infection  Recently completed Levaquin  We will continue clinically monitor you  If you start to produce more sputum or sputum becomes discolored please let our office know we will need to retest this  3. Abnormal findings on diagnostic imaging of lung  We will repeat a CT of your chest in January/2022   Follow Up:    Return in about 6 weeks (around 05/02/2020), or if symptoms worsen or fail to improve, for 30 MINUTE SLOT, Follow up with Dr. Silas Flood.   Please do your part to reduce the spread of COVID-19:      Reduce your risk of any infection  and COVID19 by using the similar precautions used for avoiding the common cold or flu:  Marland Kitchen Wash your hands often with soap and warm water for at least 20 seconds.  If soap and water are not readily available, use an alcohol-based hand sanitizer with at least 60% alcohol.  . If coughing or sneezing, cover your mouth and nose by coughing or sneezing into the elbow areas of your shirt or coat, into a tissue or into your sleeve (not your hands). Langley Gauss A MASK when in public  . Avoid shaking hands with others and consider head nods or verbal greetings only. . Avoid touching your eyes, nose, or mouth with unwashed hands.  . Avoid close contact with people who are sick. . Avoid places or events with large numbers of people in one location, like concerts  or sporting events. . If you have some symptoms but not all symptoms, continue to monitor at home and seek medical attention if your symptoms worsen. . If you are having a medical emergency, call 911.   [facility] / e-Visit: [email or web address]         [facility]: [facility]: [phone]                   [facility]: [phone]     It is flu season:   >>> Best ways to protect herself from the flu: Receive the yearly flu vaccine, practice good hand hygiene washing with soap and also using hand sanitizer when available, eat a nutritious meals, get adequate rest, hydrate appropriately   Please contact the office if your symptoms worsen or you have concerns that you are not improving.   Thank you for choosing [facility] for your healthcare, and for allowing [place] to partner with you on your healthcare journey. I am thankful to be able to provide care to you today.   Wyn Quaker FNP-C

## 2020-03-21 NOTE — Assessment & Plan Note (Signed)
6 mm right lower lobe nodule stable  Plan: Repeat CT chest in 18 months-in Jan/2022

## 2020-03-21 NOTE — Assessment & Plan Note (Signed)
Lungs clear to auscultation today Recently treated with Levaquin as well as 5 days of prednisone Patient with known Pseudomonas infection which was pansensitive  Plan: Continue Trelegy Ellipta Do not believe patient needs additional antibiotics or steroids at this time We will have patient establish care with Dr. Silas Flood and 30-minute time slot

## 2020-03-26 ENCOUNTER — Ambulatory Visit: Payer: Medicare Other | Admitting: Pulmonary Disease

## 2020-03-31 ENCOUNTER — Ambulatory Visit: Payer: Medicare Other | Admitting: Internal Medicine

## 2020-04-12 IMAGING — CT CT CHEST W/O CM
2 of 3 series · 15 of 36 positions shown, 18 images · non-contrast
Comparison: 07/14/2018

CLINICAL DATA: Follow-up lung nodule

EXAM:
CT CHEST WITHOUT CONTRAST
TECHNIQUE: Multidetector CT imaging of the chest was performed following the
standard protocol without IV contrast.

[Series 2: thorax · axial · 0.75mm/px · z∈[+1188,+1472]mm · 12 of 168 slices shown, 15 images]
[im 13/168  mediastinal]
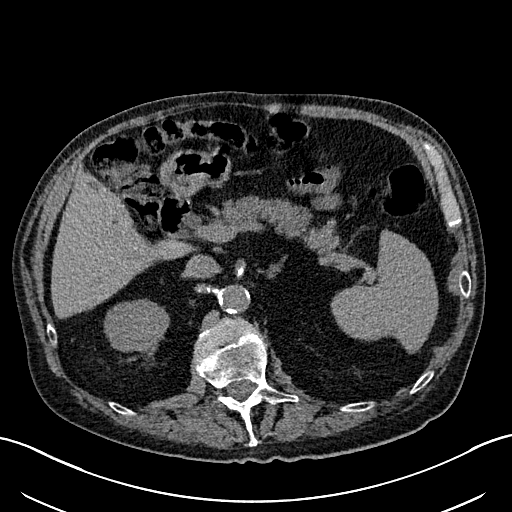
[im 13/168  lung]
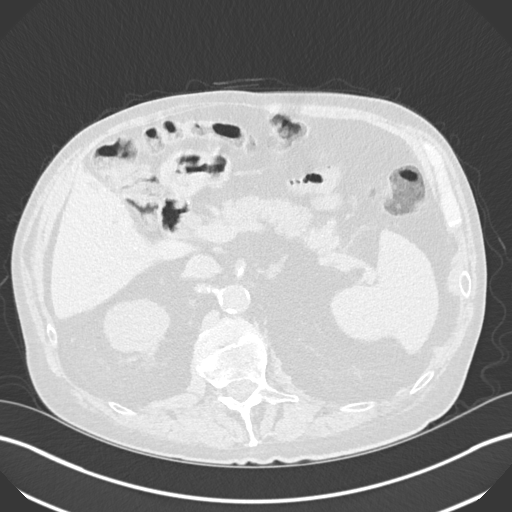
[im 25/168  lung]
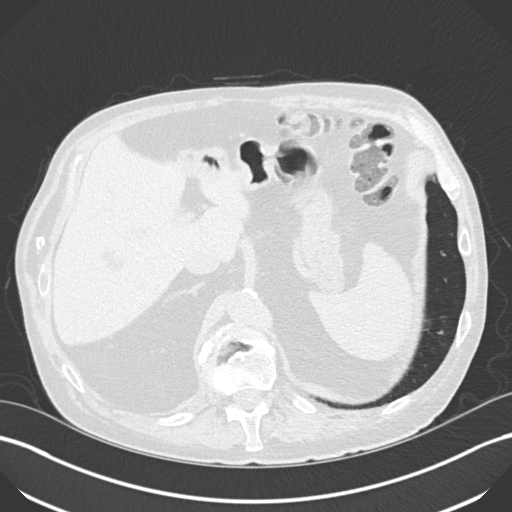
[im 38/168  lung]
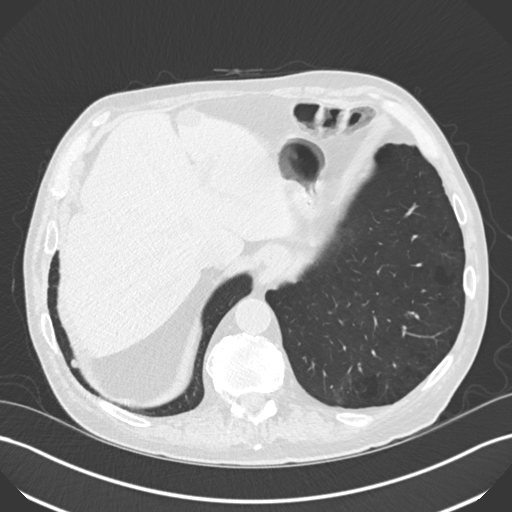
[im 50/168  lung]
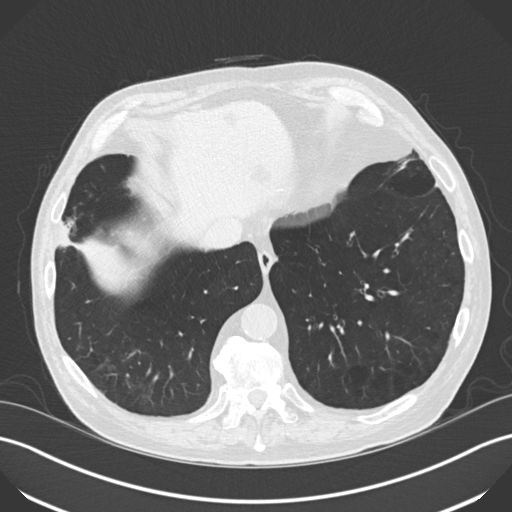
[im 62/168  mediastinal]
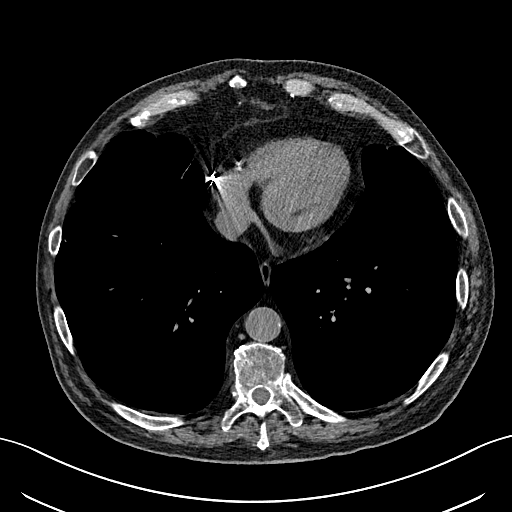
[im 62/168  lung]
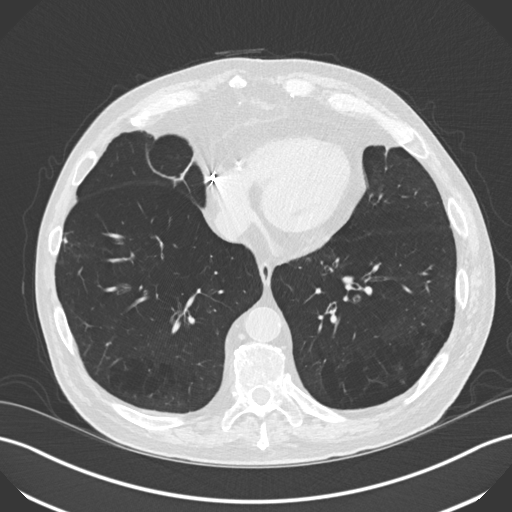
[im 75/168  lung]
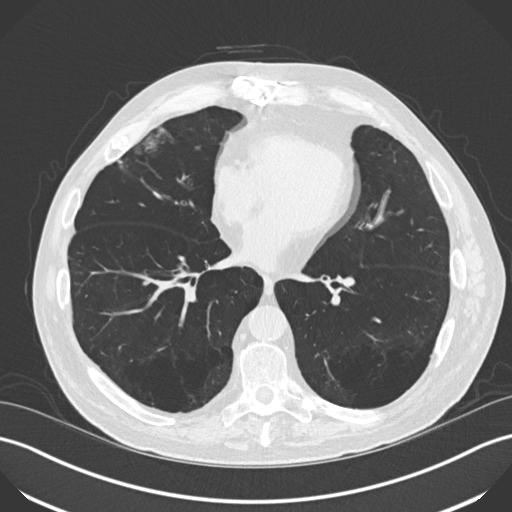
[im 93/168  lung]
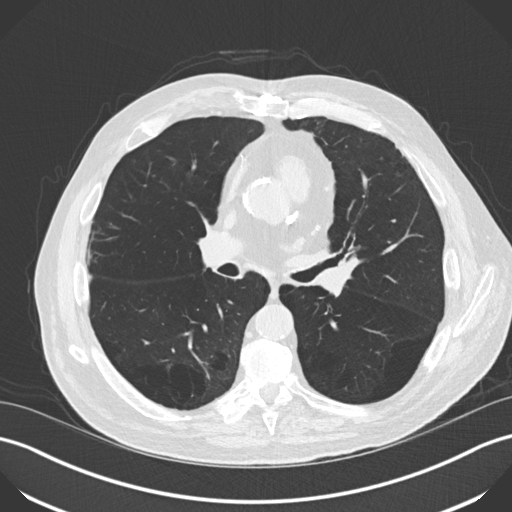
[im 106/168  lung]
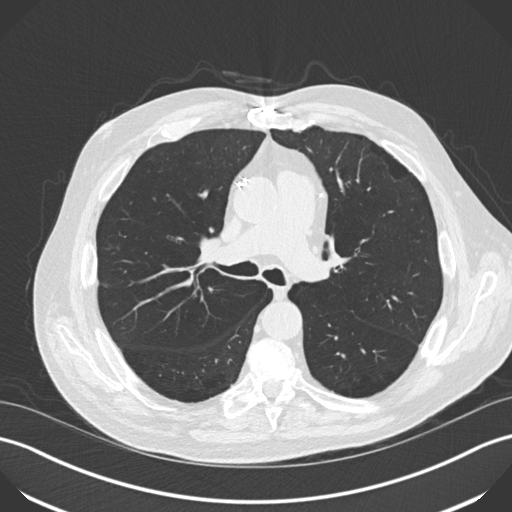
[im 118/168  mediastinal]
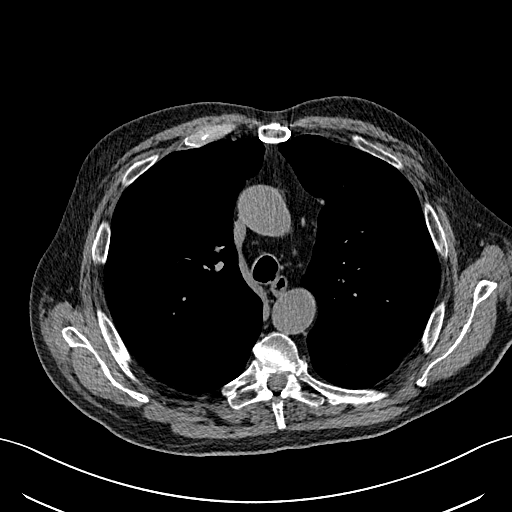
[im 118/168  lung]
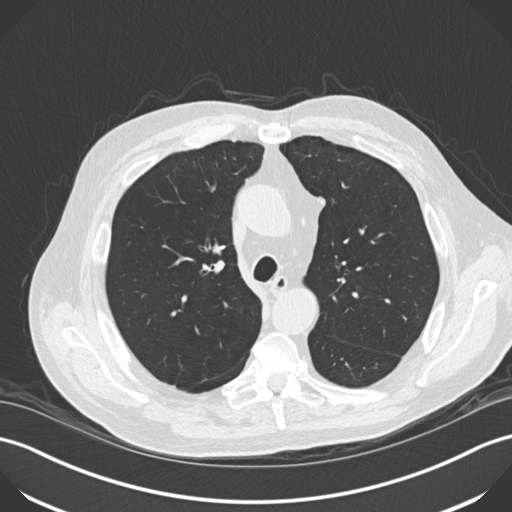
[im 130/168  lung]
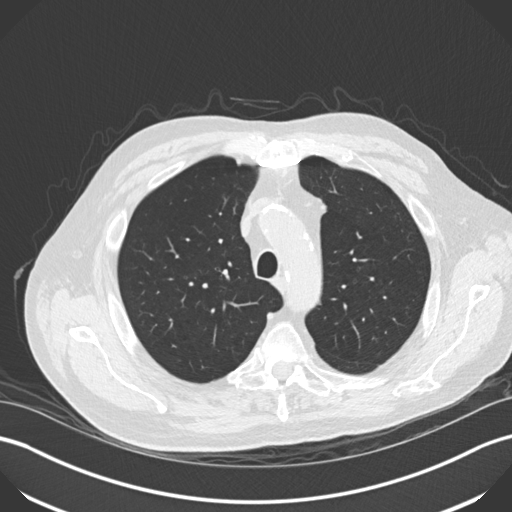
[im 143/168  lung]
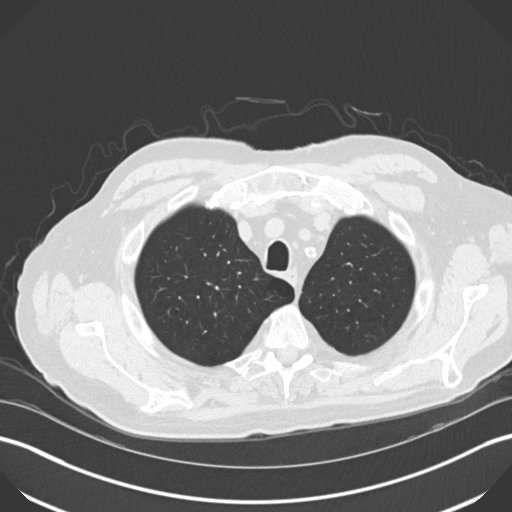
[im 155/168  lung]
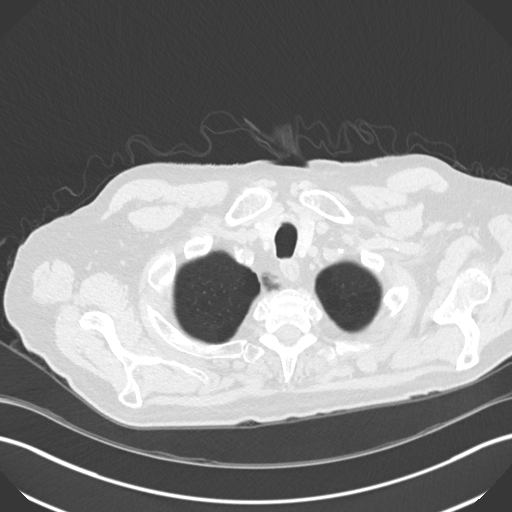

[Series 5: coronal · coronal · 0.69mm/px · 3 of 148 slices shown]
[im 30/148  lung]
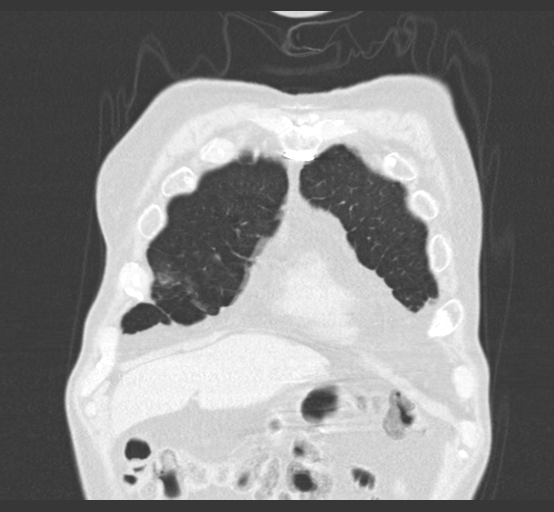
[im 59/148  lung]
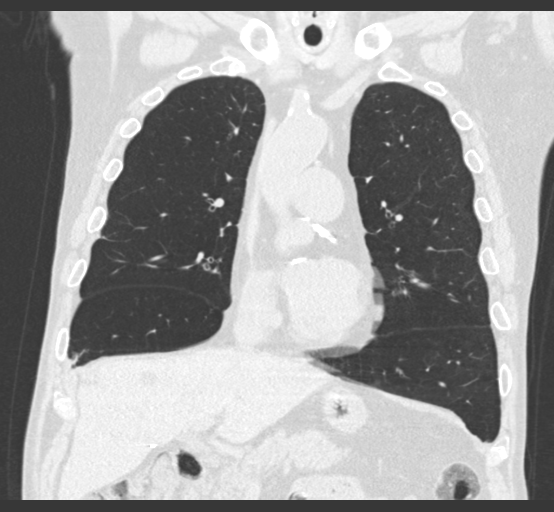
[im 89/148  lung]
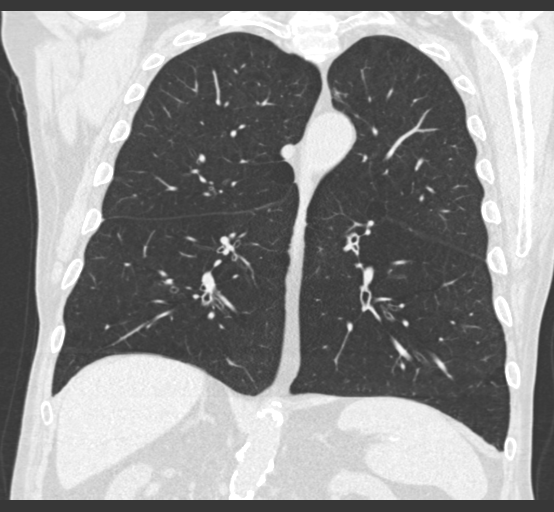

[15 of 36 positions shown; findings below may reference images not displayed]

FINDINGS: Cardiovascular: Prior CABG. Moderate aortic calcifications. No
aneurysm. Heart is normal size.

Mediastinum/Nodes: No mediastinal, hilar, or axillary adenopathy.
Thyroid unremarkable.

Lungs/Pleura: Emphysema. Linear densities noted in the right middle
lobe and lingula as well as right lower lobe at the right lung base,
corresponding to the peripheral right basilar density seen on chest
x-ray. These are most compatible with scarring. No effusions. 6 mm
nodule is noted along the right hemidiaphragm in the right lower
lobe on image 131.

Upper Abdomen: Imaging into the upper abdomen shows no acute
findings. Scattered low-density lesions throughout the liver, likely
cysts.

Musculoskeletal: Chest wall soft tissues are unremarkable. No acute
bony abnormality.
IMPRESSION: Predominantly linear densities at the lung bases, right greater than
left most compatible with scarring. This corresponds to the density
seen on prior chest x-ray.

6 mm right lower lobe basilar nodule along the right diaphragm.
Non-contrast chest CT at 6-12 months is recommended. If the nodule
is stable at time of repeat CT, then future CT at 18-24 months (from
today's scan) is considered optional for low-risk patients, but is
recommended for high-risk patients. This recommendation follows the
consensus statement: Guidelines for Management of Incidental
Pulmonary Nodules Detected on CT Images: From the [HOSPITAL]

Aortic Atherosclerosis (L8H3W-1VS.S) and Emphysema (L8H3W-65Q.1).

## 2020-04-29 ENCOUNTER — Other Ambulatory Visit: Payer: Self-pay

## 2020-04-29 ENCOUNTER — Other Ambulatory Visit: Payer: Self-pay | Admitting: *Deleted

## 2020-04-29 NOTE — Patient Outreach (Signed)
[facility]) Care Management  04/29/2020  RHYATT MUSKA 07-Oct-1935 [id number]   Quarterly telephone outreach follow up COPD  Mr. Donado says he is hanging in there. He has had one COPD exacerbation since we last talked in June. He sought medical care and was treated with an antibiotic and prednisone. The episode resolved and he continues with his Trelegy which controls his sxs.  Chart review also reveals pt reported increased AFIB during the time of his infection. He consulted with his cardiologist who advised respiratory infections can exacerbate AFIB and was advised to follow with pulmonology.  Mr. Martine will get his flu vaccine this week. He intends on getting his COVID booster when available. He and his wife mostly stay to themselves although they do go to church they keep 6' between themselves and others.  Encouraged to continue his medical regimen and may call NP if has any questions. We agreed to talk in 3 months.  Eulah Pont. Myrtie Neither, MSN, [facility] Gerontological Nurse Practitioner [facility] Care Management [phone]

## 2020-05-02 DIAGNOSIS — Z23 Encounter for immunization: Secondary | ICD-10-CM | POA: Diagnosis not present

## 2020-05-06 ENCOUNTER — Other Ambulatory Visit: Payer: Self-pay | Admitting: Physician Assistant

## 2020-05-06 ENCOUNTER — Other Ambulatory Visit: Payer: Self-pay | Admitting: Cardiology

## 2020-05-06 DIAGNOSIS — Z23 Encounter for immunization: Secondary | ICD-10-CM | POA: Diagnosis not present

## 2020-05-07 ENCOUNTER — Ambulatory Visit: Payer: Medicare Other | Admitting: Pulmonary Disease

## 2020-05-07 ENCOUNTER — Other Ambulatory Visit: Payer: Self-pay

## 2020-05-07 ENCOUNTER — Encounter: Payer: Self-pay | Admitting: Pulmonary Disease

## 2020-05-07 ENCOUNTER — Ambulatory Visit (INDEPENDENT_AMBULATORY_CARE_PROVIDER_SITE_OTHER): Payer: Medicare Other | Admitting: Pulmonary Disease

## 2020-05-07 VITALS — BP 128/56 | HR 85 | Temp 97.8°F | Ht 68.0 in | Wt 165.6 lb

## 2020-05-07 DIAGNOSIS — J449 Chronic obstructive pulmonary disease, unspecified: Secondary | ICD-10-CM

## 2020-05-07 MED ORDER — VENTOLIN HFA 108 (90 BASE) MCG/ACT IN AERS: 2.0000 | INHALATION_SPRAY | RESPIRATORY_TRACT | 5 refills | Status: DC | PRN

## 2020-05-07 NOTE — Progress Notes (Signed)
@Patient  ID: Larry Briggs, male    DOB: [birth year], 84 y.o.   MRN: [id number]  Chief Complaint  Patient presents with  . office visit    trelegy inhaler works, improvement on wheezing, shortness of breath , no cough    Referring provider: [name], MD  HPI:   Larry Briggs is a 84 year old man with past medical history of tobacco abuse now on admission and severe (FEV1 30%) COPD here for follow-up.  Several weeks ago called clinic with new cough worsening shortness of breath.  Was prescribed levofloxacin (history of pseudomonal infection) and steroids.  Treated in the outpatient setting.  This improved these acute symptoms.  He was seen recently in clinic by Wyn Quaker, NP with mild respiratory illness likely related to a viral illness picked up by his granddaughter who made been around.  The symptoms lasted few days but improved spontaneously.  Overall, he is doing well.  He has no acute complaint today.  Has baseline dyspnea on exertion that is largely unchanged over the last year or 2.  Uses Trelegy daily.  Wife thinks this has helped some in terms of exertional tolerance.  She admits that his favorite thing to do is "watch TV and sit in his chair."  He is not very active, occasionally brings in trash cans and walks around a bit.  They are quite diligent about washing hands and wearing mask.  Avoid exposure to family and friends when able.  Wear masks at church although occasionally the take them off.  Only interacts with people who are vaccinated.  He denies any cough.  No chest pain.  Rarely uses albuterol.  Usually uses it preemptively when he knows he will be out and about and exerting himself more than baseline.    Questionaires / Pulmonary Flowsheets:   ACT:  No flowsheet data found.  MMRC: No flowsheet data found.  Epworth:  No flowsheet data found.  Tests:   FENO:  Lab Results  Component Value Date   NITRICOXIDE 30 06/01/2018    PFT: PFT Results Latest Ref  Rng & Units 04/26/2016  FVC-Pre L 2.20  FVC-Predicted Pre % 53  FVC-Post L 2.41  FVC-Predicted Post % 58  Pre FEV1/FVC % % 40  Post FEV1/FCV % % 37  FEV1-Pre L 0.89  FEV1-Predicted Pre % 30  FEV1-Post L 0.90  DLCO uncorrected ml/min/mmHg 13.54  DLCO UNC% % 41  DLCO corrected ml/min/mmHg 14.48  DLCO COR %Predicted % 44  DLVA Predicted % 53  TLC L 8.20  TLC % Predicted % 116  RV % Predicted % 191    WALK:  No flowsheet data found.  Imaging: No results found.  Lab Results: Personally reviewed, no history of eosinophilia CBC    Component Value Date/Time   WBC 8.5 09/16/2017 1219   WBC 7.0 09/09/2017 0625   RBC 3.73 (L) 09/16/2017 1219   RBC 3.01 (L) 09/09/2017 0625   HGB 11.5 (L) 09/16/2017 1219   HCT 34.3 (L) 09/16/2017 1219   PLT 358 09/16/2017 1219   MCV 92 09/16/2017 1219   MCH 30.8 09/16/2017 1219   MCH 31.2 09/09/2017 0625   MCHC 33.5 09/16/2017 1219   MCHC 34.7 09/09/2017 0625   RDW 16.2 (H) 09/16/2017 1219   LYMPHSABS 0.7 09/16/2017 1219   MONOABS 0.5 09/04/2017 1047   EOSABS 0.1 09/16/2017 1219   BASOSABS 0.0 09/16/2017 1219    BMET    Component Value Date/Time   NA 135 09/09/2017 8657  K 4.1 09/09/2017 0625   CL 99 (L) 09/09/2017 0625   CO2 25 09/09/2017 0625   GLUCOSE 135 (H) 09/09/2017 0625   BUN 20 09/09/2017 0625   CREATININE 1.08 09/09/2017 0625   CREATININE 0.86 03/09/2016 0926   CALCIUM 8.3 (L) 09/09/2017 0625   GFRNONAA >60 09/09/2017 0625   GFRAA >60 09/09/2017 0625    BNP No results found for: BNP  ProBNP No results found for: PROBNP  Specialty Problems      Pulmonary Problems   COPD GOLD 4     Quit smoking 1995 - Spirometry  04/26/16   FEV1 0.90 (30%)  Ratio 0.37  No resp to saba p ? Prior to study - 07/30/2019  After extensive coaching inhaler device,  effectiveness =    75% (short Ti)        Cough      Allergies  Allergen Reactions  . Iodinated Diagnostic Agents Nausea And Vomiting and Rash    Immunization  History  Administered Date(s) Administered  . Fluad Quad(high Dose 65+) 05/18/2019  . Influenza Inj Mdck Quad Pf 04/20/2018  . Influenza Split 04/16/2016  . Influenza, High Dose Seasonal PF 05/16/2015, 05/02/2020  . PFIZER SARS-COV-2 Vaccination 08/23/2019, 09/13/2019, 05/07/2020  . Pneumococcal Conjugate-13 06/14/2014  . Zoster 06/07/2006  . Zoster Recombinat (Shingrix) 05/24/2018    Past Medical History:  Diagnosis Date  . CAD (coronary artery disease)    Remote MI in 1980. CABG 1995. Stent to left main in 2004. Last cath in April of 2012. EF 50%; patent LIMA to DX/LAD with collateralization of the distal right, patent SVG to OM, occluded SVG to distal RCA, and patent stent to LAD  . History of heart attack 1980  . Hypercholesteremia   . Hypertension   . Increased glucose level   . Obesity     Tobacco History: Social History   Tobacco Use  Smoking Status Former Smoker  . Packs/day: 0.75  . Years: 40.00  . Pack years: 30.00  . Types: Cigarettes  . Start date: 51  . Quit date: 07/02/1994  . Years since quitting: 25.8  Smokeless Tobacco Never Used   Counseling given: Not Answered   Continue to not smoke  Outpatient Encounter Medications as of 05/07/2020  Medication Sig  . acetaminophen (TYLENOL) 650 MG CR tablet Take 650 mg by mouth every 8 (eight) hours as needed.  Marland Kitchen Bioflavonoid Products (ESTER-C) 500-550 MG TABS Take 500 mg by mouth daily.  . chlorthalidone (HYGROTON) 25 MG tablet Take 1 tablet (25 mg total) by mouth at bedtime. Please make annual appt with Dr. Martinique for further refills. [phone]. 1st attempt.  . docusate sodium (COLACE) 100 MG capsule Take 100 mg by mouth as needed.  . doxylamine, Sleep, (UNISOM) 25 MG tablet Take 25 mg by mouth at bedtime as needed.  . fish oil-omega-3 fatty acids 1000 MG capsule Take 500 mg by mouth daily.   Marland Kitchen guaiFENesin (MUCINEX) 600 MG 12 hr tablet Take by mouth 2 (two) times daily as needed.  . metoprolol succinate  (TOPROL-XL) 50 MG 24 hr tablet TAKE 1 AND 1/2 TABLETS AT BEDTIME  . nitroGLYCERIN (NITROSTAT) 0.4 MG SL tablet Place 1 tablet (0.4 mg total) under the tongue as needed.  Marland Kitchen Respiratory Therapy Supplies (FLUTTER) DEVI Use as directed  . simvastatin (ZOCOR) 20 MG tablet Take 1 tablet (20 mg total) by mouth at bedtime. Please make annual appt with Dr. Martinique for further refills. [phone]. 1st attempt.  . tamsulosin (FLOMAX)  0.4 MG CAPS capsule Take 0.4 mg by mouth daily after supper.  . TRELEGY ELLIPTA 100-62.5-25 MCG/INH AEPB Place 1 Inhaler into the nose daily.  . VENTOLIN HFA 108 (90 Base) MCG/ACT inhaler Inhale 2 puffs into the lungs every 4 (four) hours as needed for wheezing or shortness of breath.  [name] 20 MG TABS tablet TAKE 1 TABLET EVERY DAY WITH SUPPER  . [DISCONTINUED] levofloxacin (LEVAQUIN) 750 MG tablet Take 1 tablet (750 mg total) by mouth daily.  . [DISCONTINUED] VENTOLIN HFA 108 (90 Base) MCG/ACT inhaler Inhale 2 puffs into the lungs every 4 (four) hours as needed for wheezing or shortness of breath.   No facility-administered encounter medications on file as of 05/07/2020.      Physical Exam  BP (!) 128/56 (BP Location: Right Arm, Cuff Size: Normal)   Pulse 85   Temp 97.8 F (36.6 C) (Temporal)   Ht 5\' 8"  (1.727 m)   Wt 165 lb 9.6 oz (75.1 kg)   SpO2 97%   BMI 25.18 kg/m   Wt Readings from Last 5 Encounters:  05/07/20 165 lb 9.6 oz (75.1 kg)  03/21/20 166 lb (75.3 kg)  01/04/20 173 lb (78.5 kg)  08/20/19 159 lb 12.8 oz (72.5 kg)  07/30/19 160 lb 6.4 oz (72.8 kg)    BMI Readings from Last 5 Encounters:  05/07/20 25.18 kg/m  03/21/20 23.82 kg/m  01/04/20 24.82 kg/m  08/20/19 22.93 kg/m  07/30/19 23.02 kg/m     Physical Exam General: Sitting up in exam chair, no acute distress Eyes: EOMI, no icterus Respiratory: Distant sounds bilaterally, no wheeze or crackle Cardiovascular: Irregularly irregular, normal rate, no murmur  appreciated Extremities: No lower extremity edema, warm   Assessment & Plan:   Dyspnea on exertion: Likely largely driven by severe COPD.  Stable.  Encourage increased exertional capacity as tolerated.  Severe FEV1 30% COPD, Gold category D: Overall stable.  Breathing seems okay.  Recent exacerbation a couple months ago treated with oral levofloxacin and oral prednisone with improved symptoms.  Continue Trelegy.  Albuterol refilled today.  Solitary pulmonary nodule: Right lower lobe stable since 07/21/18 with subsequent interval stability 02/20/19.  Per Fleischner plan 89-month follow-up (hopefully last) 18 months thereafter, plan for January 2022.   Return in about 6 months (around 11/05/2020).   Lanier Clam, MD 05/07/2020   This appointment required 48 minutes of patient care (this includes precharting, chart review, review of results, face-to-face care, etc.).

## 2020-05-07 NOTE — Patient Instructions (Signed)
Nice to meet you!  I refilled the albuterol inhaler.  Continue the Trelegy.  Follow up with Dr. Silas Flood in 6 months.  Call us sooner as needed.

## 2020-07-05 NOTE — Progress Notes (Signed)
[name] Date of Birth: Nov 13, 1935 Medical Record #[id number]  History of Present Illness: Rush Landmark is seen back today for follow up CAD. He has a known history of coronary disease and status post coronary bypass surgery in 1995. He underwent stenting of the left main coronary in 2004. His last cardiac catheterization in April 2012 showed that everything was patent except for a vein graft to the distal right coronary. The PDA was supplied by left to right collaterals. He has been followed in our HTN clinic. He was having side effects on medications. This improved with stopping lisinopril and doxazosin and starting on valsartan. He does have severe COPD. His valsartan was later switched to irbesartan and then this was later discontinued.   He was  admitted in February with left hip fracture after a mechanical fall.  He underwent intramedullary nail in the left intertrochanteric hip.  EKG showed with new T wave inversion in anterolateral leads in the setting of tachycardia and anemia.  Cardiology was consulted for abnormal EKG.  Patient however denied any anginal symptom.  Echocardiogram obtained on 09/07/2017 showed EF 50-55%, grade 1 DD, trivial pericardial effusion.  Overall poor image.  Serial troponins were negative.  Suspicion for angina was low.  Overnight, he went into atrial fibrillation with RVR which was new.  He was not a anticoagulation candidate given significant bleeding risk after surgery.  The plan was to address anticoagulation therapy on follow-up. He subsequently converted to NSR. 30 day event monitor was recommended to see if he had any further Afib. This did demonstrate paroxysmal Afib and he was started on Xarelto. ASA was discontinued.   On follow up today he is doing very well from a cardiac standpoint. He still has trouble breathing when the air is cold. Has to slow down. No cough.  No edema or palpitations. No chest pain. Had minor nose bleed.   Weight is stable.     Current Outpatient Medications on File Prior to Visit  Medication Sig Dispense Refill  . acetaminophen (TYLENOL) 650 MG CR tablet Take 650 mg by mouth every 8 (eight) hours as needed.    Marland Kitchen Bioflavonoid Products (ESTER-C) 500-550 MG TABS Take 500 mg by mouth daily.    . chlorthalidone (HYGROTON) 25 MG tablet Take 1 tablet (25 mg total) by mouth at bedtime. Please make annual appt with Dr. Martinique for further refills. [phone]. 1st attempt. 90 tablet 3  . docusate sodium (COLACE) 100 MG capsule Take 100 mg by mouth as needed.    . doxylamine, Sleep, (UNISOM) 25 MG tablet Take 25 mg by mouth at bedtime as needed.    Marland Kitchen guaiFENesin (MUCINEX) 600 MG 12 hr tablet Take by mouth 2 (two) times daily as needed.    . metoprolol succinate (TOPROL-XL) 50 MG 24 hr tablet TAKE 1 AND 1/2 TABLETS AT BEDTIME 135 tablet 1  . nitroGLYCERIN (NITROSTAT) 0.4 MG SL tablet Place 1 tablet (0.4 mg total) under the tongue as needed. 75 tablet 3  . Omega-3 Fatty Acids (FISH OIL) 500 MG CAPS Take by mouth.    . Respiratory Therapy Supplies (FLUTTER) DEVI Use as directed 1 each 0  . simvastatin (ZOCOR) 20 MG tablet Take 1 tablet (20 mg total) by mouth at bedtime. Please make annual appt with Dr. Martinique for further refills. [phone]. 1st attempt. 90 tablet 3  . tamsulosin (FLOMAX) 0.4 MG CAPS capsule Take 0.4 mg by mouth daily after supper.    Viviana Simpler ELLIPTA 100-62.5-25 MCG/INH  AEPB Place 1 Inhaler into the nose daily.    . VENTOLIN HFA 108 (90 Base) MCG/ACT inhaler Inhale 2 puffs into the lungs every 4 (four) hours as needed for wheezing or shortness of breath. 18 g 5  . XARELTO 20 MG TABS tablet TAKE 1 TABLET EVERY DAY WITH SUPPER 90 tablet 1   No current facility-administered medications on file prior to visit.    Allergies  Allergen Reactions  . Iodinated Diagnostic Agents Nausea And Vomiting and Rash    Past Medical History:  Diagnosis Date  . CAD (coronary artery disease)    Remote MI in 1980. CABG  1995. Stent to left main in 2004. Last cath in April of 2012. EF 50%; patent LIMA to DX/LAD with collateralization of the distal right, patent SVG to OM, occluded SVG to distal RCA, and patent stent to LAD  . History of heart attack 1980  . Hypercholesteremia   . Hypertension   . Increased glucose level   . Obesity     Past Surgical History:  Procedure Laterality Date  . CARDIAC CATHETERIZATION  1993  . CARDIAC CATHETERIZATION  April 2012   Mild reduction if EF at 50%. Patent LIMA to DX/LAD with collateralization to distal RCA, patent SVG to OM and occluded SVG to distal right and patent stent to LAD/Left main;  . CORONARY ARTERY BYPASS GRAFT  05/1994   x5 LIMA to LAD & DX, SVG to LCX, SVG to RCA  . CORONARY STENT PLACEMENT  2004   Stent to the L main  . INTRAMEDULLARY (IM) NAIL INTERTROCHANTERIC Left 09/05/2017   Procedure: INTRAMEDULLARY (IM) NAIL INTERTROCHANTRIC;  Surgeon: Renette Butters, MD;  Location: [facility];  Service: Orthopedics;  Laterality: Left;    Social History   Tobacco Use  Smoking Status Former Smoker  . Packs/day: 0.75  . Years: 40.00  . Pack years: 30.00  . Types: Cigarettes  . Start date: 46  . Quit date: 07/02/1994  . Years since quitting: 26.0  Smokeless Tobacco Never Used    Social History   Substance and Sexual Activity  Alcohol Use No    Family History  Problem Relation Age of Onset  . Heart attack Father   . Hypertension Father   . Stroke Mother     Review of Systems: The review of systems is per the HPI.  All other systems were reviewed and are negative.  Physical Exam: BP (!) 123/53   Pulse (!) 55   Ht 5\' 10"  (1.778 m)   Wt 168 lb 3.2 oz (76.3 kg)   SpO2 96%   BMI 24.13 kg/m  GENERAL:  Well appearing elderly WM in NAD HEENT:  PERRL, EOMI, sclera are clear. Oropharynx is clear. NECK:  No jugular venous distention, carotid upstroke brisk and symmetric, no bruits, no thyromegaly or adenopathy LUNGS:  Clear. Decreased BS CHEST:   Unremarkable HEART:  RRR with extrasystoles,  PMI not displaced or sustained,S1 and S2 within normal limits, no S3, no S4: no clicks, no rubs, no murmurs ABD:  Soft, nontender. BS +, no masses or bruits. No hepatomegaly, no splenomegaly EXT:  2 + pulses throughout, no edema, no cyanosis no clubbing SKIN:  Warm and dry.  No rashes NEURO:  Alert and oriented x 3. Cranial nerves II through XII intact. PSYCH:  Cognitively intact      LABORATORY DATA:   Lab Results  Component Value Date   WBC 8.5 09/16/2017   HGB 11.5 (L) 09/16/2017   HCT  34.3 (L) 09/16/2017   PLT 358 09/16/2017   GLUCOSE 135 (H) 09/09/2017   CHOL  11/14/2010    112        ATP III CLASSIFICATION:  <200     mg/dL   Desirable  200-239  mg/dL   Borderline High  >=240    mg/dL   High          TRIG 108 11/14/2010   HDL 40 11/14/2010   LDLCALC  11/14/2010    50        Total Cholesterol/HDL:CHD Risk Coronary Heart Disease Risk Table                     Men   Women  1/2 Average Risk   3.4   3.3  Average Risk       5.0   4.4  2 X Average Risk   9.6   7.1  3 X Average Risk  23.4   11.0        Use the calculated Patient Ratio above and the CHD Risk Table to determine the patient's CHD Risk.        ATP III CLASSIFICATION (LDL):  <100     mg/dL   Optimal  100-129  mg/dL   Near or Above                    Optimal  130-159  mg/dL   Borderline  160-189  mg/dL   High  >190     mg/dL   Very High   ALT 17 09/04/2017   AST 25 09/04/2017   NA 135 09/09/2017   K 4.1 09/09/2017   CL 99 (L) 09/09/2017   CREATININE 1.08 09/09/2017   BUN 20 09/09/2017   CO2 25 09/09/2017   TSH 0.800 09/06/2017   INR 1.05 09/04/2017   Labs reviewed from 07/22/16: cholesterol 174, triglycerides 140, LDL 82, HDL 64. CMET normal Except creatinine reported as 242 (this has to be an error).  Dated 01/25/17: A1c 5.9%.  Dated 07/27/17: cholesterol 146, triglycerides 104, HDL 56, LDL 69.  Dated 08/03/18: cholesterol 151, triglycerides 102, HDL  58, LDL 73. CMET normal Dated 08/08/19: Triglycerides 131, HDL 64.  Ecg today shows sinus brady with rate 55. Occ. PVCs. Low voltage. Nonspecific ST abnormality. I have personally reviewed and interpreted this study.   Echo 09/07/17: Study Conclusions  - Left ventricle: The cavity size was normal. Wall thickness was   increased in a pattern of mild LVH. Systolic function was normal.   The estimated ejection fraction was in the range of 50% to 55%.   Doppler parameters are consistent with abnormal left ventricular   relaxation (grade 1 diastolic dysfunction). - Aortic valve: Mildly calcified annulus. - Right atrium: The atrium was mildly dilated. - Pericardium, extracardiac: A trivial pericardial effusion was   identified.  Impressions:  - Technically difficult echo   Image quality is poor.  Event monitor 09/26/17: Study Highlights    Normal sinus rhythm  Atrial fibrillation- paroxysmal with controlled ventricular response  Isolated PVCs       Assessment / Plan: 1. CAD s/p CABG 1995. S/p stenting of the left main coronary artery 2004.  Cath 2012 showed occlusion of SVG to diagonal. He remains asymptomatic. Continue current anti-anginal Rx.   2. Paroxysmal Afib. Documented recurrence on event monitor. Asymptomatic. Now on Xarelto for anticoagulation. Tolerating it well.   3.  HTN- well controlled.   4. Hyperlipidemia.  Continue  statin and fish oil. At goal.  5. Severe COPD with recurrent bronchitis. - followed by pulmonary. Improved on Trelegy  Will update labs today including CBC, CMET, lipids and TSH.   Follow up in 6 months.

## 2020-07-10 ENCOUNTER — Other Ambulatory Visit: Payer: Self-pay

## 2020-07-10 ENCOUNTER — Telehealth: Payer: Self-pay | Admitting: Pulmonary Disease

## 2020-07-10 ENCOUNTER — Ambulatory Visit (INDEPENDENT_AMBULATORY_CARE_PROVIDER_SITE_OTHER): Payer: Medicare Other | Admitting: Cardiology

## 2020-07-10 ENCOUNTER — Encounter: Payer: Self-pay | Admitting: Cardiology

## 2020-07-10 VITALS — BP 123/53 | HR 55 | Ht 70.0 in | Wt 168.2 lb

## 2020-07-10 DIAGNOSIS — I25810 Atherosclerosis of coronary artery bypass graft(s) without angina pectoris: Secondary | ICD-10-CM | POA: Diagnosis not present

## 2020-07-10 DIAGNOSIS — I10 Essential (primary) hypertension: Secondary | ICD-10-CM

## 2020-07-10 DIAGNOSIS — E7800 Pure hypercholesterolemia, unspecified: Secondary | ICD-10-CM | POA: Diagnosis not present

## 2020-07-10 DIAGNOSIS — I480 Paroxysmal atrial fibrillation: Secondary | ICD-10-CM | POA: Diagnosis not present

## 2020-07-10 DIAGNOSIS — R918 Other nonspecific abnormal finding of lung field: Secondary | ICD-10-CM

## 2020-07-10 NOTE — Telephone Encounter (Signed)
I have this order to schedule

## 2020-07-10 NOTE — Telephone Encounter (Signed)
07/10/2020  Patient was last seen by Dr. Silas Flood on 05/07/2020.  Recommendations from that office visit were to repeat CT of chest in January/2022.  New CT order has been placed.  We'll route to [employer] for scheduling  Wyn Quaker, FNP

## 2020-07-11 LAB — CBC WITH DIFFERENTIAL/PLATELET
Basophils Absolute: 0 10*3/uL (ref 0.0–0.2)
Basos: 1 %
EOS (ABSOLUTE): 0.2 10*3/uL (ref 0.0–0.4)
Eos: 4 %
Hematocrit: 41.3 % (ref 37.5–51.0)
Hemoglobin: 14.6 g/dL (ref 13.0–17.7)
Immature Grans (Abs): 0 10*3/uL (ref 0.0–0.1)
Immature Granulocytes: 0 %
Lymphocytes Absolute: 1.2 10*3/uL (ref 0.7–3.1)
Lymphs: 18 %
MCH: 34.4 pg — ABNORMAL HIGH (ref 26.6–33.0)
MCHC: 35.4 g/dL (ref 31.5–35.7)
MCV: 97 fL (ref 79–97)
Monocytes Absolute: 0.5 10*3/uL (ref 0.1–0.9)
Monocytes: 7 %
Neutrophils Absolute: 4.7 10*3/uL (ref 1.4–7.0)
Neutrophils: 70 %
Platelets: 205 10*3/uL (ref 150–450)
RBC: 4.25 x10E6/uL (ref 4.14–5.80)
RDW: 13.8 % (ref 11.6–15.4)
WBC: 6.7 10*3/uL (ref 3.4–10.8)

## 2020-07-11 LAB — COMPREHENSIVE METABOLIC PANEL
ALT: 10 IU/L (ref 0–44)
AST: 11 IU/L (ref 0–40)
Albumin/Globulin Ratio: 1.6 (ref 1.2–2.2)
Albumin: 4.1 g/dL (ref 3.6–4.6)
Alkaline Phosphatase: 86 IU/L (ref 44–121)
BUN/Creatinine Ratio: 13 (ref 10–24)
BUN: 14 mg/dL (ref 8–27)
Bilirubin Total: 0.8 mg/dL (ref 0.0–1.2)
CO2: 29 mmol/L (ref 20–29)
Calcium: 8.9 mg/dL (ref 8.6–10.2)
Chloride: 98 mmol/L (ref 96–106)
Creatinine, Ser: 1.04 mg/dL (ref 0.76–1.27)
GFR calc Af Amer: 77 mL/min/{1.73_m2} (ref >59)
GFR calc non Af Amer: 67 mL/min/{1.73_m2} (ref >59)
Globulin, Total: 2.6 g/dL (ref 1.5–4.5)
Glucose: 118 mg/dL — ABNORMAL HIGH (ref 65–99)
Potassium: 4.1 mmol/L (ref 3.5–5.2)
Sodium: 139 mmol/L (ref 134–144)
Total Protein: 6.7 g/dL (ref 6.0–8.5)

## 2020-07-11 LAB — TSH: TSH: 1.47 u[IU]/mL (ref 0.450–4.500)

## 2020-07-11 LAB — LIPID PANEL
Chol/HDL Ratio: 2.3 ratio (ref 0.0–5.0)
Cholesterol, Total: 145 mg/dL (ref 100–199)
HDL: 64 mg/dL (ref >39)
LDL Chol Calc (NIH): 67 mg/dL (ref 0–99)
Triglycerides: 71 mg/dL (ref 0–149)
VLDL Cholesterol Cal: 14 mg/dL (ref 5–40)

## 2020-07-22 ENCOUNTER — Other Ambulatory Visit: Payer: Self-pay | Admitting: *Deleted

## 2020-07-22 NOTE — Patient Outreach (Signed)
[facility]) Care Management  07/22/2020  Larry Briggs Dec 20, 1935 [id number]  Telephone outreach for follow up COPD and CAD.  Larry Briggs reports he just had his cardiology appt and his labs (lipid panel) were improved. No cardiac complaints.  He will see his pulmonologist in the new year. He has a CT of the chest scheduled in January for follow up on right lower lob nodule. He continues to use his respiratory med, Trelegy and use of his flutter valve.  He feels he is stable.  Assessment: COPD GOLD 4                        CAD  Goals addressed and completed: Goals Addressed            This Visit's Progress   . COMPLETED: Client will verbalize knowledge of chronic lung disease as evidenced by no ED visits or Inpatient stays related to chronic lung disease        04/29/20 No ED visits since goal was initiated. Follows COPD actions plan and seeks advice and tx when needed! CCS 07/22/20 No ED visits this quarter. COPD is stable, following tx regimen and his action plan.      Encouraged him to always call MD with early sxs to prevent complications and hospitalizations.  Will transition Larry Briggs to one of our health coaches for continued care.  Eulah Pont. Myrtie Neither, MSN, [facility] Gerontological Nurse Practitioner [facility] Care Management [phone]

## 2020-07-24 ENCOUNTER — Other Ambulatory Visit: Payer: Self-pay | Admitting: *Deleted

## 2020-08-05 ENCOUNTER — Other Ambulatory Visit: Payer: Self-pay | Admitting: Cardiology

## 2020-08-07 ENCOUNTER — Other Ambulatory Visit: Payer: Self-pay | Admitting: Internal Medicine

## 2020-08-07 ENCOUNTER — Ambulatory Visit (HOSPITAL_COMMUNITY): Payer: Medicare Other

## 2020-08-15 ENCOUNTER — Ambulatory Visit (HOSPITAL_COMMUNITY)
Admission: RE | Admit: 2020-08-15 | Discharge: 2020-08-15 | Disposition: A | Discharge status: Home / Self Care | Payer: Medicare Other | Source: Ambulatory Visit | Attending: Pulmonary Disease | Admitting: Pulmonary Disease

## 2020-08-15 ENCOUNTER — Encounter (HOSPITAL_COMMUNITY): Payer: Self-pay

## 2020-08-15 ENCOUNTER — Other Ambulatory Visit: Payer: Self-pay

## 2020-08-15 DIAGNOSIS — R0602 Shortness of breath: Secondary | ICD-10-CM | POA: Diagnosis not present

## 2020-08-15 DIAGNOSIS — R918 Other nonspecific abnormal finding of lung field: Secondary | ICD-10-CM | POA: Insufficient documentation

## 2020-08-27 ENCOUNTER — Other Ambulatory Visit: Payer: Self-pay | Admitting: *Deleted

## 2020-08-27 NOTE — Patient Outreach (Signed)
[facility]) Care Management  08/27/2020  Larry Briggs [birth year] [id number]  RN Health Coach attempted follow up outreach call to patient.  Patientwas in car traveling out of town and stated he was doing good. Patient requested a call back at later date. Plan: RN will call patient again within 30 days.  [facility] Care Management [phone]

## 2020-08-28 DIAGNOSIS — Z7901 Long term (current) use of anticoagulants: Secondary | ICD-10-CM | POA: Diagnosis not present

## 2020-08-28 DIAGNOSIS — R040 Epistaxis: Secondary | ICD-10-CM | POA: Diagnosis not present

## 2020-08-28 DIAGNOSIS — J343 Hypertrophy of nasal turbinates: Secondary | ICD-10-CM | POA: Diagnosis not present

## 2020-08-28 DIAGNOSIS — S00522A Blister (nonthermal) of oral cavity, initial encounter: Secondary | ICD-10-CM | POA: Diagnosis not present

## 2020-08-28 DIAGNOSIS — J342 Deviated nasal septum: Secondary | ICD-10-CM | POA: Diagnosis not present

## 2020-08-28 DIAGNOSIS — Z7902 Long term (current) use of antithrombotics/antiplatelets: Secondary | ICD-10-CM | POA: Diagnosis not present

## 2020-08-29 ENCOUNTER — Telehealth: Payer: Self-pay | Admitting: Cardiology

## 2020-08-29 NOTE — Telephone Encounter (Signed)
Pt c/o medication issue:  1. Name of Medication: XARELTO 20 MG TABS tablet  2. How are you currently taking this medication (dosage and times per day)? As prescribed   3. Are you having a reaction (difficulty breathing--STAT)? No   4. What is your medication issue? Larry Briggs is calling stating Larry Briggs saw Dr. Redmond Baseman yesterday and was advised by him to call Dr. Doug Sou office to make him aware of his recent occurrences. She states Larry Briggs had a blood blister come up the night of 08/27/20 in the roof of his mouth in the pallet. It is a nice size and Dr. Redmond Baseman advised it may be due to something he had eaten, but his Xarelto may also be the cause. Larry Briggs also states Larry Briggs has been having nose bleeds for the past two weeks as well. Please advise.

## 2020-08-29 NOTE — Telephone Encounter (Signed)
Spoke with patient. Dr. Redmond Baseman wanted him to make Dr. Martinique aware that patient's nose was cauterized on the right nostril yesterday and that he developed a blood blister on the roof of his mouth. Patient reports he is using saline nasal spray and has had issues with nose bleeds since before he was started on Xarelto. Patient is not bleeding at the time of the call. Will forward to MD and anticoag clinic.

## 2020-08-30 NOTE — Telephone Encounter (Signed)
It would be OK to hold Xarelto for a couple of days until bleeding resolved then resume  Peter Martinique MD, [facility]

## 2020-09-01 NOTE — Telephone Encounter (Signed)
Spoke to patient Dr.Jordan's advice given. 

## 2020-09-29 ENCOUNTER — Other Ambulatory Visit: Payer: Self-pay | Admitting: *Deleted

## 2020-09-29 NOTE — Patient Outreach (Signed)
[name]  09/29/2020   Larry Briggs April 03, 1936 [id number]  [name] received return telephone call from patient.  Hipaa compliance verified. Per patient he stated it is harder to breathe in cold weather. Patient O2 saturation is 97%.  Per patient his appetite is good. Patient stated he has been exercising. He is going to start walking on his treadmill for about 5 minutes a day. Patient stated he is doing knee bends.  Per patient he is taking medications as prescribed. Patient stated  he has not had any recent falls. Patient uses a cane for balance. Patient has agreed to further outreach calls.   Encounter Medications:  Outpatient Encounter Medications as of 09/29/2020  Medication Sig  . acetaminophen (TYLENOL) 650 MG CR tablet Take 650 mg by mouth every 8 (eight) hours as needed.  Marland Kitchen Bioflavonoid Products (ESTER-C) 500-550 MG TABS Take 500 mg by mouth daily.  . chlorthalidone (HYGROTON) 25 MG tablet TAKE 1 TABLET AT BEDTIME (PLEASE CALL [phone] TO MAKE ANNUAL APPOINTMENT WITH DR Martinique FOR FURTHER REFILLS)  . docusate sodium (COLACE) 100 MG capsule Take 100 mg by mouth as needed.  . doxylamine, Sleep, (UNISOM) 25 MG tablet Take 25 mg by mouth at bedtime as needed.  Marland Kitchen guaiFENesin (MUCINEX) 600 MG 12 hr tablet Take by mouth 2 (two) times daily as needed.  . metoprolol succinate (TOPROL-XL) 50 MG 24 hr tablet TAKE 1 AND 1/2 TABLETS AT BEDTIME  . nitroGLYCERIN (NITROSTAT) 0.4 MG SL tablet Place 1 tablet (0.4 mg total) under the tongue as needed.  . Omega-3 Fatty Acids (FISH OIL) 500 MG CAPS Take by mouth.  . Respiratory Therapy Supplies (FLUTTER) DEVI Use as directed  . simvastatin (ZOCOR) 20 MG tablet TAKE 1 TABLET AT BEDTIME (PLEASE CALL [phone] TO MAKE ANNUAL APPOINTMENT WITH DR Martinique FOR FURTHER REFILLS)  . tamsulosin (FLOMAX) 0.4 MG CAPS capsule Take 0.4 mg by mouth daily after supper.  . TRELEGY ELLIPTA  100-62.5-25 MCG/INH AEPB INHALE 1 PUFF INTO THE LUNGS DAILY.  . VENTOLIN HFA 108 (90 Base) MCG/ACT inhaler Inhale 2 puffs into the lungs every 4 (four) hours as needed for wheezing or shortness of breath.  [name] 20 MG TABS tablet TAKE 1 TABLET EVERY DAY WITH SUPPER   No facility-administered encounter medications on file as of 09/29/2020.    Functional Status:  No flowsheet data found.  Fall/Depression Screening: Fall Risk  03/31/2016  Falls in the past year? Yes  Comment Emmi Telephone Survey: data to providers prior to load  Number falls in past yr: 1  Comment Emmi Telephone Survey Actual Response = 1  Injury with Fall? Yes   PHQ 2/9 Scores 09/29/2020 07/06/2019  PHQ - 2 Score 0 0    Assessment:  Goals Addressed            This Visit's Progress   . (THN)Manage Fatigue (Tiredness-COPD)       Timeframe:  Long-Range Goal Priority:  Medium Start Date:    86767209                         Expected End Date:  47096283                    Follow Up Date 66294765   - eat healthy - get outdoors every day (weather permitting) - use devices that will help like a cane, sock-puller or reacher  Why is this important?    Feeling tired or worn out is a common symptom of COPD (chronic obstructive pulmonary disease).   Learning when you feel your best and when you need rest is important.   Managing the tiredness (fatigue) will help you be active and enjoy life.     Notes:     . (THN)Track and Manage My Symptoms-COPD       Timeframe:  Long-Range Goal Priority:  Medium Start Date:     76734193                        Expected End Date:      79024097                 Follow Up Date 35329924   - develop a rescue plan - eliminate symptom triggers at home - follow rescue plan if symptoms flare-up - keep follow-up appointments    Why is this important?    Tracking your symptoms and other information about your health helps your doctor plan your care.   Write down the  symptoms, the time of day, what you were doing and what medicine you are taking.   You will soon learn how to manage your symptoms.     Notes:     . (THN)Track and Manage My Triggers-COPD       Timeframe:  Long-Range Goal Priority:  High Start Date:  26834196                           Expected End Date:       22297989                Follow Up Date 21194174   - limit outdoor activity during cold weather - listen for public air quality announcements every day    Why is this important?    Triggers are activities or things, like tobacco smoke or cold weather, that make your COPD (chronic obstructive pulmonary disease) flare-up.   Knowing these triggers helps you plan how to stay away from them.   When you cannot remove them, you can learn how to manage them.     Notes:        Plan:  Follow-up:  Patient agrees to Care Plan and Follow-up.  Provided education on Eating Plan for COPD Provided education  On COPD and physical therapy Provided education on COPD exacerbation RN will follow up within the month of August RN sent an assessment to PCP  [place] Management [phone]

## 2020-09-29 NOTE — Patient Instructions (Signed)
Goals Addressed            This Visit's Progress   . (THN)Manage Fatigue (Tiredness-COPD)       Timeframe:  Long-Range Goal Priority:  Medium Start Date:    71245809                         Expected End Date:  98338250                    Follow Up Date 53976734   - eat healthy - get outdoors every day (weather permitting) - use devices that will help like a cane, sock-puller or reacher    Why is this important?    Feeling tired or worn out is a common symptom of COPD (chronic obstructive pulmonary disease).   Learning when you feel your best and when you need rest is important.   Managing the tiredness (fatigue) will help you be active and enjoy life.     Notes:     . (THN)Track and Manage My Symptoms-COPD       Timeframe:  Long-Range Goal Priority:  Medium Start Date:     19379024                        Expected End Date:      09735329                 Follow Up Date 92426834   - develop a rescue plan - eliminate symptom triggers at home - follow rescue plan if symptoms flare-up - keep follow-up appointments    Why is this important?    Tracking your symptoms and other information about your health helps your doctor plan your care.   Write down the symptoms, the time of day, what you were doing and what medicine you are taking.   You will soon learn how to manage your symptoms.     Notes:     . (THN)Track and Manage My Triggers-COPD       Timeframe:  Long-Range Goal Priority:  High Start Date:  19622297                           Expected End Date:       98921194                Follow Up Date 17408144   - limit outdoor activity during cold weather - listen for public air quality announcements every day    Why is this important?    Triggers are activities or things, like tobacco smoke or cold weather, that make your COPD (chronic obstructive pulmonary disease) flare-up.   Knowing these triggers helps you plan how to stay away from them.   When you  cannot remove them, you can learn how to manage them.     Notes:

## 2020-09-29 NOTE — Patient Outreach (Signed)
[place] [facility]) Care Management  09/29/2020  Larry Briggs [birth year] [id number]  RN Health Coach telephone call to patient.  Hipaa compliance verified. Per patient they are out running errands and asked to be called back after 3 pm.  [place] Management [phone]

## 2020-09-29 NOTE — Patient Outreach (Signed)
[place] [facility]) Care Management  09/29/2020  Larry Briggs 26-Mar-1936 [id number]   RN Health Coach attempted follow up outreach call after 3 as patient requested.  Patient was unavailable. HIPPA compliance voicemail message left with return callback number.  Plan: RN will call patient again within 30 days.  [place] Care Management [phone]

## 2020-09-30 ENCOUNTER — Telehealth: Payer: Self-pay | Admitting: Cardiology

## 2020-09-30 NOTE — Telephone Encounter (Signed)
Spoke with pt wife, she reports the patient feels tired all the time. Otherwise, he is not having any dizziness or fainting feelings. Aware dr Martinique is working in the hospital today but will forward to him for his recommendations.

## 2020-09-30 NOTE — Telephone Encounter (Signed)
OK to reduce metoprolol to just once a day and monitor BP  Jaxen Samples Martinique MD, [facility]

## 2020-09-30 NOTE — Telephone Encounter (Signed)
Pt c/o medication issue:  1. Name of Medication: metoprolol succinate (TOPROL-XL) 50 MG 24 hr tablet  2. How are you currently taking this medication (dosage and times per day)? As directed  3. Are you having a reaction (difficulty breathing--STAT)? no  4. What is your medication issue? Spoke to pt's wife. She spoke to her husbands medicare nurse and expressed that her husbands HR has been running low. Today it was 45 and sometimes it does go up into the 50's but never reaches the 60's. His BP was 143/73. The nurse recommended asking Dr. Martinique if he can go back to just 1 tablet at bedtime to see if this helps. Please advise.

## 2020-10-01 MED ORDER — METOPROLOL SUCCINATE ER 25 MG PO TB24
25.0000 mg | ORAL_TABLET | Freq: Every day | ORAL | 0 refills | Status: DC
Start: 2020-10-01 — End: 2020-10-20

## 2020-10-01 NOTE — Telephone Encounter (Signed)
Returned call to patients wife (okay per DPR). Who states that patient's heart rate has been in the 40's for the last several days. Per patients wife patient has been very fatigued, dizzy on occasion, and short of breath with exertion. Patients wife reports that today patients HR is 48 and oxygen level is 99.   Spoke with Dr. Martinique regarding patient, per Dr. Martinique decrease Metoprolol to 25mg  daily.   Made patients wife aware, advised her to continue to monitor patients blood pressure and symptoms. Patient's wife made aware of ED precautions should symptoms worsen or new symptoms occur.    Advised patient's wife to call back to office if any issues, questions, or concerns.   Patients wife verbalized understanding.

## 2020-10-01 NOTE — Telephone Encounter (Signed)
Patient's wife states the patient says he feels like his heart is going into afib more. She states just to get the mail his heart wants to go into afib. She states he is very tired, more than normal and he is frustrated. She states last night he didn't sleep all night long.

## 2020-10-03 ENCOUNTER — Telehealth: Payer: Self-pay | Admitting: Cardiology

## 2020-10-03 NOTE — Telephone Encounter (Signed)
Spoke to patient's wife she stated husband decreased Metoprolol to 25 mg daily this past Wed 3/2.Stated pulse continues to be low and B/P elevated.Readings listed below.Patient complains of weak,no energy. B/P at present 143/65 pulse 55.Advised to continue to monitor B/P and pulse over the weekend.Dr.Jordan is not in office this afternoon.I will make him aware on Mon 3/7.

## 2020-10-03 NOTE — Telephone Encounter (Signed)
STAT if HR is under 50 or over 120 (normal HR is 60-100 beats per minute)  1) What is your heart rate? 47 and 168/81  2) Do you have a log of your heart rate readings (document readings)? no  3) Do you have any other symptoms? No- wants to go back to 1 pill of metoprolol instead of the half

## 2020-10-06 ENCOUNTER — Other Ambulatory Visit: Payer: Self-pay

## 2020-10-06 MED ORDER — AMLODIPINE BESYLATE 2.5 MG PO TABS: 2.5000 mg | ORAL_TABLET | Freq: Every day | ORAL | 6 refills | Status: DC

## 2020-10-06 NOTE — Telephone Encounter (Signed)
Spoke to patient B/P readings over the weekend-161/86,153/67,160/86,170/78,165/97.Pulse- [phone].Dr.Jordan reviewed and advised to add Amlodipine 2.5 mg daily.Continue all other medications.Advised continue to monitor and bring readings to Dr.Jordan's appointment 10/20/20 at 10:20 am.

## 2020-10-17 NOTE — Progress Notes (Signed)
Larry Briggs Date of Birth: [birth year] Medical Record #[id number]  History of Present Illness: Larry Briggs is seen back today for follow up CAD. He has a known history of coronary disease and status post coronary bypass surgery in 1995. He underwent stenting of the left main coronary in 2004. His last cardiac catheterization in April 2012 showed that everything was patent except for a vein graft to the distal right coronary. The PDA was supplied by left to right collaterals. He has been followed in our HTN clinic. He was having side effects on medications. This improved with stopping lisinopril and doxazosin and starting on valsartan. He does have severe COPD. His valsartan was later switched to irbesartan and then this was later discontinued.   He was  admitted in February 2019 with left hip fracture after a mechanical fall.  He underwent intramedullary nail in the left intertrochanteric hip.  EKG showed with new T wave inversion in anterolateral leads in the setting of tachycardia and anemia.  Cardiology was consulted for abnormal EKG.  Patient however denied any anginal symptom.  Echocardiogram obtained on 09/07/2017 showed EF 50-55%, grade 1 DD, trivial pericardial effusion.  Overall poor image.  Serial troponins were negative.  Suspicion for angina was low.  Overnight, he went into atrial fibrillation with RVR which was new.  He was not a anticoagulation candidate given significant bleeding risk after surgery.  The plan was to address anticoagulation therapy on follow-up. He subsequently converted to NSR. 30 day event monitor was recommended to see if he had any further Afib. This did demonstrate paroxysmal Afib and he was started on Xarelto. ASA was discontinued.   Recently he called with complaints of sustained bradycardia with HR in the 40s. Metoprolol was reduced but then BP increased. Amlodipine 2.5 mg was added. He is seen today to assess response.   He notes BP is still elevated. Notes HR in  the mornings is still upper 40s to low 50s. Is taking Toprol at night. Notes some palpitations. No chest pain.   Current Outpatient Medications on File Prior to Visit  Medication Sig Dispense Refill  . acetaminophen (TYLENOL) 650 MG CR tablet Take 650 mg by mouth every 8 (eight) hours as needed.    Marland Kitchen Bioflavonoid Products (ESTER-C) 500-550 MG TABS Take 500 mg by mouth daily.    . chlorthalidone (HYGROTON) 25 MG tablet TAKE 1 TABLET AT BEDTIME (PLEASE CALL [phone] TO MAKE ANNUAL APPOINTMENT WITH DR Martinique FOR FURTHER REFILLS) 90 tablet 3  . docusate sodium (COLACE) 100 MG capsule Take 100 mg by mouth as needed.    . doxylamine, Sleep, (UNISOM) 25 MG tablet Take 25 mg by mouth at bedtime as needed.    Marland Kitchen guaiFENesin (MUCINEX) 600 MG 12 hr tablet Take by mouth 2 (two) times daily as needed.    . metoprolol succinate (TOPROL-XL) 50 MG 24 hr tablet Take 25 mg by mouth daily. Take with or immediately following a meal.    . nitroGLYCERIN (NITROSTAT) 0.4 MG SL tablet Place 1 tablet (0.4 mg total) under the tongue as needed. 75 tablet 3  . Omega-3 Fatty Acids (FISH OIL) 500 MG CAPS Take by mouth.    . Respiratory Therapy Supplies (FLUTTER) DEVI Use as directed 1 each 0  . simvastatin (ZOCOR) 20 MG tablet TAKE 1 TABLET AT BEDTIME (PLEASE CALL [phone] TO MAKE ANNUAL APPOINTMENT WITH DR Martinique FOR FURTHER REFILLS) 90 tablet 3  . tamsulosin (FLOMAX) 0.4 MG CAPS capsule Take 0.4 mg by  mouth daily after supper.    . TRELEGY ELLIPTA 100-62.5-25 MCG/INH AEPB INHALE 1 PUFF INTO THE LUNGS DAILY. 180 each 1  . VENTOLIN HFA 108 (90 Base) MCG/ACT inhaler Inhale 2 puffs into the lungs every 4 (four) hours as needed for wheezing or shortness of breath. 18 g 5  . XARELTO 20 MG TABS tablet TAKE 1 TABLET EVERY DAY WITH SUPPER 90 tablet 1   No current facility-administered medications on file prior to visit.    Allergies  Allergen Reactions  . Iodinated Diagnostic Agents Nausea And Vomiting and Rash     Past Medical History:  Diagnosis Date  . CAD (coronary artery disease)    Remote MI in 1980. CABG 1995. Stent to left main in 2004. Last cath in April of 2012. EF 50%; patent LIMA to DX/LAD with collateralization of the distal right, patent SVG to OM, occluded SVG to distal RCA, and patent stent to LAD  . History of heart attack 1980  . Hypercholesteremia   . Hypertension   . Increased glucose level   . Obesity     Past Surgical History:  Procedure Laterality Date  . CARDIAC CATHETERIZATION  1993  . CARDIAC CATHETERIZATION  April 2012   Mild reduction if EF at 50%. Patent LIMA to DX/LAD with collateralization to distal RCA, patent SVG to OM and occluded SVG to distal right and patent stent to LAD/Left main;  . CORONARY ARTERY BYPASS GRAFT  05/1994   x5 LIMA to LAD & DX, SVG to LCX, SVG to RCA  . CORONARY STENT PLACEMENT  2004   Stent to the L main  . INTRAMEDULLARY (IM) NAIL INTERTROCHANTERIC Left 09/05/2017   Procedure: INTRAMEDULLARY (IM) NAIL INTERTROCHANTRIC;  Surgeon: Renette Butters, MD;  Location: [facility];  Service: Orthopedics;  Laterality: Left;    Social History   Tobacco Use  Smoking Status Former Smoker  . Packs/day: 0.75  . Years: 40.00  . Pack years: 30.00  . Types: Cigarettes  . Start date: 23  . Quit date: 07/02/1994  . Years since quitting: 26.3  Smokeless Tobacco Never Used    Social History   Substance and Sexual Activity  Alcohol Use No    Family History  Problem Relation Age of Onset  . Heart attack Father   . Hypertension Father   . Stroke Mother     Review of Systems: The review of systems is per the HPI.  All other systems were reviewed and are negative.  Physical Exam: BP 140/60   Pulse (!) 58   Ht 5\' 10"  (1.778 m)   Wt 167 lb (75.8 kg)   SpO2 99%   BMI 23.96 kg/m  GENERAL:  Well appearing elderly WM in NAD HEENT:  PERRL, EOMI, sclera are clear. Oropharynx is clear. NECK:  No jugular venous distention, carotid upstroke  brisk and symmetric, no bruits, no thyromegaly or adenopathy LUNGS:  Clear. Decreased BS CHEST:  Unremarkable HEART:  RRR with extrasystoles,  PMI not displaced or sustained,S1 and S2 within normal limits, no S3, no S4: no clicks, no rubs, no murmurs ABD:  Soft, nontender. BS +, no masses or bruits. No hepatomegaly, no splenomegaly EXT:  2 + pulses throughout, no edema, no cyanosis no clubbing SKIN:  Warm and dry.  No rashes NEURO:  Alert and oriented x 3. Cranial nerves II through XII intact. PSYCH:  Cognitively intact      LABORATORY DATA:   Lab Results  Component Value Date   WBC 6.7  07/10/2020   HGB 14.6 07/10/2020   HCT 41.3 07/10/2020   PLT 205 07/10/2020   GLUCOSE 118 (H) 07/10/2020   CHOL 145 07/10/2020   TRIG 71 07/10/2020   HDL 64 07/10/2020   LDLCALC 67 07/10/2020   ALT 10 07/10/2020   AST 11 07/10/2020   NA 139 07/10/2020   K 4.1 07/10/2020   CL 98 07/10/2020   CREATININE 1.04 07/10/2020   BUN 14 07/10/2020   CO2 29 07/10/2020   TSH 1.470 07/10/2020   INR 1.05 09/04/2017   Labs reviewed from 07/22/16: cholesterol 174, triglycerides 140, LDL 82, HDL 64. CMET normal Except creatinine reported as 242 (this has to be an error).  Dated 01/25/17: A1c 5.9%.  Dated 07/27/17: cholesterol 146, triglycerides 104, HDL 56, LDL 69.  Dated 08/03/18: cholesterol 151, triglycerides 102, HDL 58, LDL 73. CMET normal Dated 08/08/19: Triglycerides 131, HDL 64.    Echo 09/07/17: Study Conclusions  - Left ventricle: The cavity size was normal. Wall thickness was   increased in a pattern of mild LVH. Systolic function was normal.   The estimated ejection fraction was in the range of 50% to 55%.   Doppler parameters are consistent with abnormal left ventricular   relaxation (grade 1 diastolic dysfunction). - Aortic valve: Mildly calcified annulus. - Right atrium: The atrium was mildly dilated. - Pericardium, extracardiac: A trivial pericardial effusion was    identified.  Impressions:  - Technically difficult echo   Image quality is poor.  Event monitor 09/26/17: Study Highlights    Normal sinus rhythm  Atrial fibrillation- paroxysmal with controlled ventricular response  Isolated PVCs       Assessment / Plan: 1. CAD s/p CABG 1995. S/p stenting of the left main coronary artery 2004.  Cath 2012 showed occlusion of SVG to diagonal. He remains asymptomatic. Continue current anti-anginal Rx.   2. Paroxysmal Afib. Now with some degree ov tachy-brady syndrome. Recently reduced Toprol XL from 75 to 25 mg daily. recommend he take this in the morning. Will have him wear a 2 week event monitor to assess his HR response.  Now on Xarelto for anticoagulation. Tolerating it well.   3.  HTN- elevated. Had to reduce beta blocker. Will increase amlodipine to 5 mg daily. On chlorthalidone.  4. Hyperlipidemia.  Continue statin and fish oil. At goal.  5. Severe COPD with recurrent bronchitis. - followed by pulmonary. Improved on Trelegy    Follow up in 3 months.

## 2020-10-20 ENCOUNTER — Telehealth: Payer: Self-pay | Admitting: Cardiology

## 2020-10-20 ENCOUNTER — Ambulatory Visit (INDEPENDENT_AMBULATORY_CARE_PROVIDER_SITE_OTHER): Payer: Medicare Other

## 2020-10-20 ENCOUNTER — Ambulatory Visit (INDEPENDENT_AMBULATORY_CARE_PROVIDER_SITE_OTHER): Payer: Medicare Other | Admitting: Cardiology

## 2020-10-20 ENCOUNTER — Encounter: Payer: Self-pay | Admitting: *Deleted

## 2020-10-20 ENCOUNTER — Encounter: Payer: Self-pay | Admitting: Cardiology

## 2020-10-20 ENCOUNTER — Other Ambulatory Visit: Payer: Self-pay

## 2020-10-20 VITALS — BP 140/60 | HR 58 | Ht 70.0 in | Wt 167.0 lb

## 2020-10-20 DIAGNOSIS — I495 Sick sinus syndrome: Secondary | ICD-10-CM

## 2020-10-20 DIAGNOSIS — I10 Essential (primary) hypertension: Secondary | ICD-10-CM | POA: Diagnosis not present

## 2020-10-20 DIAGNOSIS — I25810 Atherosclerosis of coronary artery bypass graft(s) without angina pectoris: Secondary | ICD-10-CM

## 2020-10-20 DIAGNOSIS — I480 Paroxysmal atrial fibrillation: Secondary | ICD-10-CM | POA: Diagnosis not present

## 2020-10-20 MED ORDER — AMLODIPINE BESYLATE 5 MG PO TABS: 5.0000 mg | ORAL_TABLET | Freq: Every day | ORAL | 3 refills | Status: DC

## 2020-10-20 NOTE — Telephone Encounter (Signed)
Called patient wife, she states that he already has other doctor appointment on the 27th, and did not noticed the 1st appointment was still scheduled. I advised to just keep this appointment, but I would send a message to [place] and if it needs to be pushed out she would contact them.  Patient wife verbalized understanding.   27th appointment was cancelled.

## 2020-10-20 NOTE — Patient Instructions (Signed)
Double amlodipine to 5 mg daily  Take Toprol XL 25 mg in the am  We will have you wear an event monitor for 2 weeks.

## 2020-10-20 NOTE — Telephone Encounter (Signed)
    Pt is scheduled for 3 mos f/u on 01/26/21 however when they got home they saw he has conflict. Pt already have an appt on 12/31/20, they would like to know if that's ok to keep it or they need an exact 3 months out f/u with Dr. Martinique

## 2020-10-20 NOTE — Progress Notes (Signed)
Patient ID: Larry Briggs, male   DOB: 05-21-1936, 85 y.o.   MRN: [id number] Patient enrolled for [employer] to ship a 14 day ZIO XT long term holter monitor to his home. Letter with instructions mailed to patient.

## 2020-10-21 NOTE — Telephone Encounter (Signed)
Spoke to patient's wife husband will keep appointment already scheduled with Dr.Jordan 12/31/20 at 8:40 am.

## 2020-10-25 DIAGNOSIS — I25810 Atherosclerosis of coronary artery bypass graft(s) without angina pectoris: Secondary | ICD-10-CM | POA: Diagnosis not present

## 2020-10-25 DIAGNOSIS — I10 Essential (primary) hypertension: Secondary | ICD-10-CM

## 2020-10-25 DIAGNOSIS — I495 Sick sinus syndrome: Secondary | ICD-10-CM | POA: Diagnosis not present

## 2020-10-25 DIAGNOSIS — I480 Paroxysmal atrial fibrillation: Secondary | ICD-10-CM

## 2020-10-29 ENCOUNTER — Ambulatory Visit: Payer: Medicare Other | Admitting: *Deleted

## 2020-11-03 ENCOUNTER — Telehealth: Payer: Self-pay | Admitting: Pulmonary Disease

## 2020-11-03 MED ORDER — PREDNISONE 20 MG PO TABS: 40.0000 mg | ORAL_TABLET | Freq: Every day | ORAL | 0 refills | Status: AC

## 2020-11-03 MED ORDER — LEVOFLOXACIN 750 MG PO TABS: 750.0000 mg | ORAL_TABLET | Freq: Every day | ORAL | 0 refills | Status: DC

## 2020-11-03 MED ORDER — PREDNISONE 20 MG PO TABS: ORAL_TABLET | ORAL | 0 refills | Status: DC

## 2020-11-03 MED ORDER — LEVOFLOXACIN 750 MG PO TABS: 750.0000 mg | ORAL_TABLET | Freq: Every day | ORAL | 0 refills | Status: AC

## 2020-11-03 NOTE — Telephone Encounter (Signed)
Predniosone 40 mg daily x 5 days and levofloxacin 750 mg daily x 5 days sent to local pharmacy for presumed COPD exacerbation. If amount of blood he coughs up increases he should stop the xarelto for 48 hours. IF he coughs up more than a tablespoon at once of fresh blood or more than a quarter of a cup in a day of fresh blood he needs to go to the ED. If symptoms not improving by Thursday he needs to be seen in clinic or ED.

## 2020-11-03 NOTE — Telephone Encounter (Signed)
pt wife is calling because pt was coughing on Saturday (11/01/20) & she states that the phlegm looked " dark red around the edges, like old blood".  pt told his wife saturday that it had been going on for few weeks but it was not an everyday thing. Also mentioned that pt didn't cough yesterday or so far today.  she is wondering if he should wait till 11/27/20 to See Dr. Silas Flood or should he come in sooner.   please advise  [phone]

## 2020-11-03 NOTE — Telephone Encounter (Signed)
Called and spoke with Patient. Dr. Silvestre Moment recommendations given. Understanding stated. Prescriptions sent to requested [facility]. Nothing further at this time.

## 2020-11-03 NOTE — Telephone Encounter (Signed)
Called and spoke with pt who states that he has been coughing up mucus that has some blood around the edges which he believes is old blood. Pt said that this has been happening on and off for at least 1 month now. States that it is not an every day thing but since it keeps happening is why him and wife called the office about it.  Pt said that he also feels like his breathing is getting shorter quicker which he believes is due to his COPD.  Other than these symptoms, pt denies anything else.  Pt is scheduled for a f/u with Dr. Silas Flood 4/28 and wants to know if that date will be okay to keep or if his appt might need to be moved up sooner. Dr. Silas Flood, please advise on this and also about pt's symptoms.

## 2020-11-04 DIAGNOSIS — Z961 Presence of intraocular lens: Secondary | ICD-10-CM | POA: Diagnosis not present

## 2020-11-04 DIAGNOSIS — H5212 Myopia, left eye: Secondary | ICD-10-CM | POA: Diagnosis not present

## 2020-11-17 ENCOUNTER — Other Ambulatory Visit: Payer: Self-pay | Admitting: Physician Assistant

## 2020-11-18 ENCOUNTER — Telehealth: Payer: Self-pay | Admitting: Pharmacist

## 2020-11-18 DIAGNOSIS — I480 Paroxysmal atrial fibrillation: Secondary | ICD-10-CM | POA: Diagnosis not present

## 2020-11-18 DIAGNOSIS — I10 Essential (primary) hypertension: Secondary | ICD-10-CM | POA: Diagnosis not present

## 2020-11-18 DIAGNOSIS — I495 Sick sinus syndrome: Secondary | ICD-10-CM | POA: Diagnosis not present

## 2020-11-18 DIAGNOSIS — I25810 Atherosclerosis of coronary artery bypass graft(s) without angina pectoris: Secondary | ICD-10-CM | POA: Diagnosis not present

## 2020-11-18 NOTE — Telephone Encounter (Signed)
Received call for Xarelto refill.  Had already been sent to pharmacy

## 2020-11-18 NOTE — Telephone Encounter (Signed)
92m, 75.8kg, scr 1.04 07/30/20, ccr 58.7, lovw/jordan 10/20/20

## 2020-11-27 ENCOUNTER — Encounter: Payer: Self-pay | Admitting: Pulmonary Disease

## 2020-11-27 ENCOUNTER — Other Ambulatory Visit: Payer: Self-pay

## 2020-11-27 ENCOUNTER — Ambulatory Visit (INDEPENDENT_AMBULATORY_CARE_PROVIDER_SITE_OTHER): Payer: Medicare Other | Admitting: Pulmonary Disease

## 2020-11-27 VITALS — BP 110/62 | HR 50 | Temp 97.3°F | Ht 70.0 in | Wt 168.8 lb

## 2020-11-27 DIAGNOSIS — J449 Chronic obstructive pulmonary disease, unspecified: Secondary | ICD-10-CM

## 2020-11-27 DIAGNOSIS — R911 Solitary pulmonary nodule: Secondary | ICD-10-CM

## 2020-11-27 MED ORDER — AZITHROMYCIN 250 MG PO TABS: 250.0000 mg | ORAL_TABLET | ORAL | 5 refills | Status: DC

## 2020-11-27 NOTE — Patient Instructions (Signed)
Nice to see you   Use the albuterol with the spacer - we will give you one today if we have one  Continue all inhalers  Add azithromycin 1 pill every Monday, Wednesday, Friday. The goal is this will help with your breathing and reduce need for prednisone.  Return to clinic in 6 months with Dr. Silas Flood for follow up

## 2020-12-31 ENCOUNTER — Ambulatory Visit: Payer: Medicare Other | Admitting: Cardiology

## 2021-01-05 NOTE — Progress Notes (Signed)
Larry Briggs Date of Birth: 01/16/1936 Medical Record #[id number]  History of Present Illness: Larry Briggs is seen back today for follow up CAD. He has a known history of coronary disease and status post coronary bypass surgery in 1995. He underwent stenting of the left main coronary in 2004. His last cardiac catheterization in April 2012 showed that everything was patent except for a vein graft to the distal right coronary. The PDA was supplied by left to right collaterals. He has been followed in our HTN clinic. He was having side effects on medications. This improved with stopping lisinopril and doxazosin and starting on valsartan. He does have severe COPD. His valsartan was later switched to irbesartan and then this was later discontinued.   He was  admitted in February 2019 with left hip fracture after a mechanical fall.  He underwent intramedullary nail in the left intertrochanteric hip.  EKG showed with new T wave inversion in anterolateral leads in the setting of tachycardia and anemia.  Cardiology was consulted for abnormal EKG.  Patient however denied any anginal symptom.  Echocardiogram obtained on 09/07/2017 showed EF 50-55%, grade 1 DD, trivial pericardial effusion.  Overall poor image.  Serial troponins were negative.  Suspicion for angina was low.  Overnight, he went into atrial fibrillation with RVR which was new.  He was not a anticoagulation candidate given significant bleeding risk after surgery.  The plan was to address anticoagulation therapy on follow-up. He subsequently converted to NSR. 30 day event monitor was recommended to see if he had any further Afib. This did demonstrate paroxysmal Afib and he was started on Xarelto. ASA was discontinued.   Last spring he called with complaints of sustained bradycardia with HR in the 40s. Metoprolol was reduced. Event monitor showed PVCs but no significant bradycardia and no Afib.   On follow up today he is doing well. No chest pain or  palpitations. SOB has not changed. No edema. Feels well.    Current Outpatient Medications on File Prior to Visit  Medication Sig Dispense Refill   acetaminophen (TYLENOL) 650 MG CR tablet Take 650 mg by mouth every 8 (eight) hours as needed.     amLODipine (NORVASC) 5 MG tablet Take 1 tablet (5 mg total) by mouth daily. 90 tablet 3   Bioflavonoid Products (ESTER-C) 500-550 MG TABS Take 500 mg by mouth daily.     chlorthalidone (HYGROTON) 25 MG tablet TAKE 1 TABLET AT BEDTIME (PLEASE CALL [phone] TO MAKE ANNUAL APPOINTMENT WITH DR Martinique FOR FURTHER REFILLS) 90 tablet 3   docusate sodium (COLACE) 100 MG capsule Take 100 mg by mouth as needed.     doxylamine, Sleep, (UNISOM) 25 MG tablet Take 25 mg by mouth at bedtime as needed.     guaiFENesin (MUCINEX) 600 MG 12 hr tablet Take by mouth 2 (two) times daily as needed.     metoprolol succinate (TOPROL-XL) 50 MG 24 hr tablet Take 25 mg by mouth daily. Take with or immediately following a meal.     nitroGLYCERIN (NITROSTAT) 0.4 MG SL tablet Place 1 tablet (0.4 mg total) under the tongue as needed. 75 tablet 3   Omega-3 Fatty Acids (FISH OIL) 500 MG CAPS Take by mouth.     Respiratory Therapy Supplies (FLUTTER) DEVI Use as directed 1 each 0   simvastatin (ZOCOR) 20 MG tablet TAKE 1 TABLET AT BEDTIME (PLEASE CALL [phone] TO MAKE ANNUAL APPOINTMENT WITH DR Martinique FOR FURTHER REFILLS) 90 tablet 3   tamsulosin (FLOMAX)  0.4 MG CAPS capsule Take 0.4 mg by mouth daily after supper.     TRELEGY ELLIPTA 100-62.5-25 MCG/INH AEPB INHALE 1 PUFF INTO THE LUNGS DAILY. 180 each 1   VENTOLIN HFA 108 (90 Base) MCG/ACT inhaler Inhale 2 puffs into the lungs every 4 (four) hours as needed for wheezing or shortness of breath. 18 g 5   XARELTO 20 MG TABS tablet TAKE 1 TABLET EVERY DAY WITH SUPPER 90 tablet 1   No current facility-administered medications on file prior to visit.    Allergies  Allergen Reactions   Iodinated Diagnostic Agents Nausea And  Vomiting and Rash    Past Medical History:  Diagnosis Date   CAD (coronary artery disease)    Remote MI in 1980. CABG 1995. Stent to left main in 2004. Last cath in April of 2012. EF 50%; patent LIMA to DX/LAD with collateralization of the distal right, patent SVG to OM, occluded SVG to distal RCA, and patent stent to LAD   History of heart attack 1980   Hypercholesteremia    Hypertension    Increased glucose level    Obesity     Past Surgical History:  Procedure Laterality Date   [place]  April 2012   Mild reduction if EF at 50%. Patent LIMA to DX/LAD with collateralization to distal RCA, patent SVG to OM and occluded SVG to distal right and patent stent to LAD/Left main;   CORONARY ARTERY BYPASS GRAFT  05/1994   x5 LIMA to LAD & DX, SVG to LCX, SVG to RCA   CORONARY STENT PLACEMENT  2004   Stent to the L main   INTRAMEDULLARY (IM) NAIL INTERTROCHANTERIC Left 09/05/2017   Procedure: INTRAMEDULLARY (IM) NAIL INTERTROCHANTRIC;  Surgeon: Renette Butters, MD;  Location: [facility];  Service: Orthopedics;  Laterality: Left;    Social History   Tobacco Use  Smoking Status Former   Packs/day: 0.75   Years: 40.00   Pack years: 30.00   Types: Cigarettes   Start date: 72   Quit date: 07/02/1994   Years since quitting: 26.5  Smokeless Tobacco Never    Social History   Substance and Sexual Activity  Alcohol Use No    Family History  Problem Relation Age of Onset   Heart attack Father    Hypertension Father    Stroke Mother     Review of Systems: The review of systems is per the HPI.  All other systems were reviewed and are negative.  Physical Exam: BP 140/84 (BP Location: Left Arm, Patient Position: Sitting, Cuff Size: Normal)   Pulse 81   Ht 5\' 10"  (1.778 m)   Wt 167 lb 12.8 oz (76.1 kg)   BMI 24.08 kg/m  GENERAL:  Well appearing elderly WM in NAD HEENT:  PERRL, EOMI, sclera are clear. Oropharynx is clear. NECK:  No  jugular venous distention, carotid upstroke brisk and symmetric, no bruits, no thyromegaly or adenopathy LUNGS:  Clear. Decreased BS CHEST:  Unremarkable HEART:  RRR with extrasystoles,  PMI not displaced or sustained,S1 and S2 within normal limits, no S3, no S4: no clicks, no rubs, no murmurs ABD:  Soft, nontender. BS +, no masses or bruits. No hepatomegaly, no splenomegaly EXT:  2 + pulses throughout, no edema, no cyanosis no clubbing SKIN:  Warm and dry.  No rashes NEURO:  Alert and oriented x 3. Cranial nerves II through XII intact. PSYCH:  Cognitively intact      LABORATORY  DATA:   Lab Results  Component Value Date   WBC 6.7 07/10/2020   HGB 14.6 07/10/2020   HCT 41.3 07/10/2020   PLT 205 07/10/2020   GLUCOSE 118 (H) 07/10/2020   CHOL 145 07/10/2020   TRIG 71 07/10/2020   HDL 64 07/10/2020   LDLCALC 67 07/10/2020   ALT 10 07/10/2020   AST 11 07/10/2020   NA 139 07/10/2020   K 4.1 07/10/2020   CL 98 07/10/2020   CREATININE 1.04 07/10/2020   BUN 14 07/10/2020   CO2 29 07/10/2020   TSH 1.470 07/10/2020   INR 1.05 09/04/2017   Labs reviewed from 07/22/16: cholesterol 174, triglycerides 140, LDL 82, HDL 64. CMET normal Except creatinine reported as 242 (this has to be an error).  Dated 01/25/17: A1c 5.9%.  Dated 07/27/17: cholesterol 146, triglycerides 104, HDL 56, LDL 69.  Dated 08/03/18: cholesterol 151, triglycerides 102, HDL 58, LDL 73. CMET normal Dated 08/08/19: Triglycerides 131, HDL 64.  Ecg today shows NSR with first degree AV block. Frequent PVCs. Nonspecific ST T abnormality. I have personally reviewed and interpreted this study.   Echo 09/07/17: Study Conclusions   - Left ventricle: The cavity size was normal. Wall thickness was   increased in a pattern of mild LVH. Systolic function was normal.   The estimated ejection fraction was in the range of 50% to 55%.   Doppler parameters are consistent with abnormal left ventricular   relaxation (grade 1 diastolic  dysfunction). - Aortic valve: Mildly calcified annulus. - Right atrium: The atrium was mildly dilated. - Pericardium, extracardiac: A trivial pericardial effusion was   identified.   Impressions:   - Technically difficult echo   Image quality is poor.  Event monitor 09/26/17: Study Highlights   Normal sinus rhythm Atrial fibrillation- paroxysmal with controlled ventricular response Isolated PVCs     Event monitor 11/18/20: Study Highlights    Normal sinus rhythm. average HR 83. predominantly first degree AV block with infrequent second degree AV block Mobitz type 1 Frequent PVCs with bigeminy and NSVT- longest episode 16 beats. Brief runs of PAT. longest 6 beats. No Afib seen No sustained bradycardia or pauses.    Assessment / Plan: 1. CAD s/p CABG 1995. S/p stenting of the left main coronary artery 2004.  Cath 2012 showed occlusion of SVG to diagonal. He remains asymptomatic. Continue current anti-anginal Rx.   2. Paroxysmal Afib. Now with some degree ov tachy-brady syndrome. Follow up event monitor showed PVCs and some NSVT but no significant bradycardia or Afib.   Now on Xarelto for anticoagulation. Tolerating it well. Continue current therapy.   3.  HTN- well controlled now on current regimen.  4. Hyperlipidemia.  Continue statin and fish oil. At goal.  5. Severe COPD with recurrent bronchitis. - followed by pulmonary. Improved on Trelegy    Follow up in 6 months.

## 2021-01-07 DIAGNOSIS — H1131 Conjunctival hemorrhage, right eye: Secondary | ICD-10-CM | POA: Diagnosis not present

## 2021-01-19 ENCOUNTER — Other Ambulatory Visit: Payer: Self-pay

## 2021-01-19 ENCOUNTER — Ambulatory Visit (INDEPENDENT_AMBULATORY_CARE_PROVIDER_SITE_OTHER): Payer: Medicare Other | Admitting: Cardiology

## 2021-01-19 ENCOUNTER — Encounter: Payer: Self-pay | Admitting: Cardiology

## 2021-01-19 VITALS — BP 140/84 | HR 81 | Ht 70.0 in | Wt 167.8 lb

## 2021-01-19 DIAGNOSIS — I480 Paroxysmal atrial fibrillation: Secondary | ICD-10-CM | POA: Diagnosis not present

## 2021-01-19 DIAGNOSIS — J449 Chronic obstructive pulmonary disease, unspecified: Secondary | ICD-10-CM

## 2021-01-19 DIAGNOSIS — I10 Essential (primary) hypertension: Secondary | ICD-10-CM

## 2021-01-19 DIAGNOSIS — I25810 Atherosclerosis of coronary artery bypass graft(s) without angina pectoris: Secondary | ICD-10-CM

## 2021-01-19 DIAGNOSIS — E7800 Pure hypercholesterolemia, unspecified: Secondary | ICD-10-CM | POA: Diagnosis not present

## 2021-01-21 NOTE — Progress Notes (Signed)
@Patient  ID: Larry Briggs, male    DOB: May 21, 1936, 85 y.o.   MRN: [id number]  Chief Complaint  Patient presents with   Follow-up    No current respiratory complaints.     Referring provider: [name], MD  HPI:   Larry Briggs is a 85 year old man with past medical history of tobacco abuse now in remission and severe (FEV1 30%) COPD here for follow-up.  Recent COPD exacerbation. Cough worsened, more dyspneic. Prescribed prednisone 40 mg x 5 days and Levaquin x 5 days. Symptoms improved. Discussed role of adjunctive therapies in effort to decrease exacerbations.    Questionaires / Pulmonary Flowsheets:   ACT:  No flowsheet data found.  MMRC: No flowsheet data found.  Epworth:  No flowsheet data found.  Tests:   FENO:  Lab Results  Component Value Date   NITRICOXIDE 30 06/01/2018    PFT: PFT Results Latest Ref Rng & Units 04/26/2016  FVC-Pre L 2.20  FVC-Predicted Pre % 53  FVC-Post L 2.41  FVC-Predicted Post % 58  Pre FEV1/FVC % % 40  Post FEV1/FCV % % 37  FEV1-Pre L 0.89  FEV1-Predicted Pre % 30  FEV1-Post L 0.90  DLCO uncorrected ml/min/mmHg 13.54  DLCO UNC% % 41  DLCO corrected ml/min/mmHg 14.48  DLCO COR %Predicted % 44  DLVA Predicted % 53  TLC L 8.20  TLC % Predicted % 116  RV % Predicted % 191   Severe COPD with severely reduced DLCO on my interpretation.   WALK:  No flowsheet data found.  Imaging: No results found.  Lab Results: Personally reviewed, no history of eosinophilia CBC    Component Value Date/Time   WBC 6.7 07/10/2020 1423   WBC 7.0 09/09/2017 0625   RBC 4.25 07/10/2020 1423   RBC 3.01 (L) 09/09/2017 0625   HGB 14.6 07/10/2020 1423   HCT 41.3 07/10/2020 1423   PLT 205 07/10/2020 1423   MCV 97 07/10/2020 1423   MCH 34.4 (H) 07/10/2020 1423   MCH 31.2 09/09/2017 0625   MCHC 35.4 07/10/2020 1423   MCHC 34.7 09/09/2017 0625   RDW 13.8 07/10/2020 1423   LYMPHSABS 1.2 07/10/2020 1423   MONOABS 0.5 09/04/2017 1047    EOSABS 0.2 07/10/2020 1423   BASOSABS 0.0 07/10/2020 1423    BMET    Component Value Date/Time   NA 139 07/10/2020 1423   K 4.1 07/10/2020 1423   CL 98 07/10/2020 1423   CO2 29 07/10/2020 1423   GLUCOSE 118 (H) 07/10/2020 1423   GLUCOSE 135 (H) 09/09/2017 0625   BUN 14 07/10/2020 1423   CREATININE 1.04 07/10/2020 1423   CREATININE 0.86 03/09/2016 0926   CALCIUM 8.9 07/10/2020 1423   GFRNONAA 67 07/10/2020 1423   GFRAA 77 07/10/2020 1423    BNP No results found for: BNP  ProBNP No results found for: PROBNP  Specialty Problems       Pulmonary Problems   COPD GOLD 4     Quit smoking 1995 - Spirometry  04/26/16   FEV1 0.90 (30%)  Ratio 0.37  No resp to saba p ? Prior to study - 07/30/2019  After extensive coaching inhaler device,  effectiveness =    75% (short Ti)         Cough    Allergies  Allergen Reactions   Iodinated Diagnostic Agents Nausea And Vomiting and Rash    Immunization History  Administered Date(s) Administered   Fluad Quad(high Dose 65+) 05/18/2019   Influenza Inj Mdck  Quad Pf 04/20/2018   Influenza Split 04/16/2016   Influenza, High Dose Seasonal PF 05/16/2015, 05/02/2020   PFIZER(Purple Top)SARS-COV-2 Vaccination 08/23/2019, 09/13/2019, 05/07/2020   Pneumococcal Conjugate-13 06/14/2014   Zoster Recombinat (Shingrix) 05/24/2018   Zoster, Live 06/07/2006    Past Medical History:  Diagnosis Date   CAD (coronary artery disease)    Remote MI in 1980. CABG 1995. Stent to left main in 2004. Last cath in April of 2012. EF 50%; patent LIMA to DX/LAD with collateralization of the distal right, patent SVG to OM, occluded SVG to distal RCA, and patent stent to LAD   History of heart attack 1980   Hypercholesteremia    Hypertension    Increased glucose level    Obesity     Tobacco History: Social History   Tobacco Use  Smoking Status Former   Packs/day: 0.75   Years: 40.00   Pack years: 30.00   Types: Cigarettes   Start date: 34    Quit date: 07/02/1994   Years since quitting: 26.5  Smokeless Tobacco Never   Counseling given: Not Answered   Continue to not smoke  Outpatient Encounter Medications as of 11/27/2020  Medication Sig   acetaminophen (TYLENOL) 650 MG CR tablet Take 650 mg by mouth every 8 (eight) hours as needed.   amLODipine (NORVASC) 5 MG tablet Take 1 tablet (5 mg total) by mouth daily.   Bioflavonoid Products (ESTER-C) 500-550 MG TABS Take 500 mg by mouth daily.   chlorthalidone (HYGROTON) 25 MG tablet TAKE 1 TABLET AT BEDTIME (PLEASE CALL [phone] TO MAKE ANNUAL APPOINTMENT WITH DR Martinique FOR FURTHER REFILLS)   docusate sodium (COLACE) 100 MG capsule Take 100 mg by mouth as needed.   doxylamine, Sleep, (UNISOM) 25 MG tablet Take 25 mg by mouth at bedtime as needed.   guaiFENesin (MUCINEX) 600 MG 12 hr tablet Take by mouth 2 (two) times daily as needed.   metoprolol succinate (TOPROL-XL) 50 MG 24 hr tablet Take 25 mg by mouth daily. Take with or immediately following a meal.   nitroGLYCERIN (NITROSTAT) 0.4 MG SL tablet Place 1 tablet (0.4 mg total) under the tongue as needed.   Omega-3 Fatty Acids (FISH OIL) 500 MG CAPS Take by mouth.   Respiratory Therapy Supplies (FLUTTER) DEVI Use as directed   simvastatin (ZOCOR) 20 MG tablet TAKE 1 TABLET AT BEDTIME (PLEASE CALL [phone] TO MAKE ANNUAL APPOINTMENT WITH DR Martinique FOR FURTHER REFILLS)   tamsulosin (FLOMAX) 0.4 MG CAPS capsule Take 0.4 mg by mouth daily after supper.   TRELEGY ELLIPTA 100-62.5-25 MCG/INH AEPB INHALE 1 PUFF INTO THE LUNGS DAILY.   VENTOLIN HFA 108 (90 Base) MCG/ACT inhaler Inhale 2 puffs into the lungs every 4 (four) hours as needed for wheezing or shortness of breath.   XARELTO 20 MG TABS tablet TAKE 1 TABLET EVERY DAY WITH SUPPER   [DISCONTINUED] azithromycin (ZITHROMAX) 250 MG tablet Take 1 tablet (250 mg total) by mouth 3 (three) times a week. Monday, Wednesday, and Friday.   [DISCONTINUED] predniSONE (DELTASONE) 20 MG  tablet Take 40mg  daily x's 5 days   No facility-administered encounter medications on file as of 11/27/2020.      Physical Exam  BP 110/62 (BP Location: Left Arm, Cuff Size: Normal)   Pulse (!) 50   Temp (!) 97.3 F (36.3 C) (Temporal)   Ht 5\' 10"  (1.778 m)   Wt 168 lb 12.8 oz (76.6 kg)   SpO2 97% Comment: RA  BMI 24.22 kg/m   Wt Readings  from Last 5 Encounters:  01/19/21 167 lb 12.8 oz (76.1 kg)  11/27/20 168 lb 12.8 oz (76.6 kg)  10/20/20 167 lb (75.8 kg)  07/10/20 168 lb 3.2 oz (76.3 kg)  05/07/20 165 lb 9.6 oz (75.1 kg)    BMI Readings from Last 5 Encounters:  01/19/21 24.08 kg/m  11/27/20 24.22 kg/m  10/20/20 23.96 kg/m  07/10/20 24.13 kg/m  05/07/20 25.18 kg/m     Physical Exam General: Sitting up in exam chair, no acute distress Eyes: EOMI, no icterus Respiratory: Distant sounds bilaterally, no wheeze or crackle Cardiovascular: Irregularly irregular, normal rate, no murmur appreciated Extremities: No lower extremity edema, warm   Assessment & Plan:   Dyspnea on exertion: Likely largely driven by severe COPD.  Stable.  Encourage increased exertional capacity as tolerated.  Severe FEV1 30% COPD, Gold category D: Recent exacerbations, more than 2 last 12 months. .  Breathing seems okay. Continue Trelegy.  Albuterol with spacer. Add MWF azithromycin for anti-inflammatory properties in effort to reduce exacerbations.   Solitary pulmonary nodule: Right lower lobe stable since 07/21/18 with subsequent interval stability 02/20/19 and 08/2020.  No further follow up needed.   Return in about 6 months (around 05/29/2021).   Lanier Clam, MD 01/21/2021

## 2021-01-26 ENCOUNTER — Ambulatory Visit: Payer: Medicare Other | Admitting: Cardiology

## 2021-01-26 DIAGNOSIS — E785 Hyperlipidemia, unspecified: Secondary | ICD-10-CM | POA: Diagnosis not present

## 2021-01-26 DIAGNOSIS — Z0000 Encounter for general adult medical examination without abnormal findings: Secondary | ICD-10-CM | POA: Diagnosis not present

## 2021-01-26 DIAGNOSIS — E1121 Type 2 diabetes mellitus with diabetic nephropathy: Secondary | ICD-10-CM | POA: Diagnosis not present

## 2021-02-09 DIAGNOSIS — Z23 Encounter for immunization: Secondary | ICD-10-CM | POA: Diagnosis not present

## 2021-02-16 ENCOUNTER — Other Ambulatory Visit: Payer: Self-pay | Admitting: Internal Medicine

## 2021-02-23 DIAGNOSIS — E1121 Type 2 diabetes mellitus with diabetic nephropathy: Secondary | ICD-10-CM | POA: Diagnosis not present

## 2021-02-23 DIAGNOSIS — Z0000 Encounter for general adult medical examination without abnormal findings: Secondary | ICD-10-CM | POA: Diagnosis not present

## 2021-02-23 DIAGNOSIS — D692 Other nonthrombocytopenic purpura: Secondary | ICD-10-CM | POA: Diagnosis not present

## 2021-02-23 DIAGNOSIS — J449 Chronic obstructive pulmonary disease, unspecified: Secondary | ICD-10-CM | POA: Diagnosis not present

## 2021-02-23 DIAGNOSIS — R0989 Other specified symptoms and signs involving the circulatory and respiratory systems: Secondary | ICD-10-CM | POA: Diagnosis not present

## 2021-02-23 DIAGNOSIS — I4891 Unspecified atrial fibrillation: Secondary | ICD-10-CM | POA: Diagnosis not present

## 2021-02-23 DIAGNOSIS — N1831 Chronic kidney disease, stage 3a: Secondary | ICD-10-CM | POA: Diagnosis not present

## 2021-02-23 DIAGNOSIS — S80812A Abrasion, left lower leg, initial encounter: Secondary | ICD-10-CM | POA: Diagnosis not present

## 2021-02-23 DIAGNOSIS — D6869 Other thrombophilia: Secondary | ICD-10-CM | POA: Diagnosis not present

## 2021-02-23 DIAGNOSIS — I259 Chronic ischemic heart disease, unspecified: Secondary | ICD-10-CM | POA: Diagnosis not present

## 2021-02-23 DIAGNOSIS — E785 Hyperlipidemia, unspecified: Secondary | ICD-10-CM | POA: Diagnosis not present

## 2021-02-23 DIAGNOSIS — I10 Essential (primary) hypertension: Secondary | ICD-10-CM | POA: Diagnosis not present

## 2021-02-25 ENCOUNTER — Telehealth: Payer: Self-pay

## 2021-02-25 NOTE — Telephone Encounter (Signed)
Spoke to patient's wife Xarelto 20 mg samples left front desk.

## 2021-02-26 DIAGNOSIS — R0989 Other specified symptoms and signs involving the circulatory and respiratory systems: Secondary | ICD-10-CM | POA: Diagnosis not present

## 2021-03-25 ENCOUNTER — Other Ambulatory Visit: Payer: Self-pay | Admitting: *Deleted

## 2021-03-25 NOTE — Patient Instructions (Signed)
Goals Addressed             This Visit's Progress    (THN)Manage Fatigue (Tiredness-COPD)   On track    Timeframe:  Long-Range Goal Priority:  Medium Start Date:    [id number]                         Expected End Date:  [id number]                    Follow Up Date [id number]   - eat healthy - get outdoors every day (weather permitting) - use devices that will help like a cane, sock-puller or reacher    Why is this important?   Feeling tired or worn out is a common symptom of COPD (chronic obstructive pulmonary disease).  Learning when you feel your best and when you need rest is important.  Managing the tiredness (fatigue) will help you be active and enjoy life.     Notes:      (THN)Track and Manage My Symptoms-COPD   On track    Timeframe:  Long-Range Goal Priority:  Medium Start Date:     [id number]                        Expected End Date:      [id number]                 Follow Up Date [id number]   - develop a rescue plan - eliminate symptom triggers at home - follow rescue plan if symptoms flare-up - keep follow-up appointments    Why is this important?   Tracking your symptoms and other information about your health helps your doctor plan your care.  Write down the symptoms, the time of day, what you were doing and what medicine you are taking.  You will soon learn how to manage your symptoms.     Notes:  [id number] Patient is monitoring his COPD symptoms and following action plan     (THN)Track and Manage My Triggers-COPD   On track    Timeframe:  Long-Range Goal Priority:  High Start Date:  [id number]                           Expected End Date:       [id number]                Follow Up Date [id number]   - limit outdoor activity during cold weather - listen for public air quality announcements every day    Why is this important?   Triggers are activities or things, like tobacco smoke or cold weather, that make your COPD (chronic obstructive pulmonary disease) flare-up.   Knowing these triggers helps you plan how to stay away from them.  When you cannot remove them, you can learn how to manage them.     Notes:

## 2021-03-25 NOTE — Patient Outreach (Signed)
[facility]) Care Management  [place]  03/25/2021   Larry Briggs 10-Apr-1936 [id number]  [place] telephone call to patient.  Hipaa compliance verified. Per patient he is doing good. Patient stated that he is using inhalers as per ordered. Patient stated that he uses his prn rescue medications 2-3 times a week. Patient stated his appetite is fair. He has not had any recent falls. Patient has not had any admissions for COPD exacerbation. Patient agreed to follow up outreach calls.   Encounter Medications:  Outpatient Encounter Medications as of 03/25/2021  Medication Sig   acetaminophen (TYLENOL) 650 MG CR tablet Take 650 mg by mouth every 8 (eight) hours as needed.   amLODipine (NORVASC) 5 MG tablet Take 1 tablet (5 mg total) by mouth daily.   Bioflavonoid Products (ESTER-C) 500-550 MG TABS Take 500 mg by mouth daily.   chlorthalidone (HYGROTON) 25 MG tablet TAKE 1 TABLET AT BEDTIME (PLEASE CALL [phone] TO MAKE ANNUAL APPOINTMENT WITH DR Martinique FOR FURTHER REFILLS)   docusate sodium (COLACE) 100 MG capsule Take 100 mg by mouth as needed.   doxylamine, Sleep, (UNISOM) 25 MG tablet Take 25 mg by mouth at bedtime as needed.   Fluticasone-Umeclidin-Vilant (TRELEGY ELLIPTA) 100-62.5-25 MCG/INH AEPB INHALE 1 PUFF INTO THE LUNGS DAILY.   guaiFENesin (MUCINEX) 600 MG 12 hr tablet Take by mouth 2 (two) times daily as needed.   metoprolol succinate (TOPROL-XL) 50 MG 24 hr tablet Take 25 mg by mouth daily. Take with or immediately following a meal.   nitroGLYCERIN (NITROSTAT) 0.4 MG SL tablet Place 1 tablet (0.4 mg total) under the tongue as needed.   Omega-3 Fatty Acids (FISH OIL) 500 MG CAPS Take by mouth.   Respiratory Therapy Supplies (FLUTTER) DEVI Use as directed   simvastatin (ZOCOR) 20 MG tablet TAKE 1 TABLET AT BEDTIME (PLEASE CALL [phone] TO MAKE ANNUAL APPOINTMENT WITH DR Martinique FOR FURTHER REFILLS)   tamsulosin (FLOMAX) 0.4 MG CAPS capsule  Take 0.4 mg by mouth daily after supper.   VENTOLIN HFA 108 (90 Base) MCG/ACT inhaler Inhale 2 puffs into the lungs every 4 (four) hours as needed for wheezing or shortness of breath.   XARELTO 20 MG TABS tablet TAKE 1 TABLET EVERY DAY WITH SUPPER   No facility-administered encounter medications on file as of 03/25/2021.    Functional Status:  No flowsheet data found.  Fall/Depression Screening: Fall Risk  03/31/2016  Falls in the past year? Yes  Comment Emmi Telephone Survey: data to providers prior to load  Number falls in past yr: 1  Comment Emmi Telephone Survey Actual Response = 1  Injury with Fall? Yes   PHQ 2/9 Scores 09/29/2020 07/06/2019  PHQ - 2 Score 0 0    Assessment:   Care Plan Care Plan : COPD (Adult)  Updates made by Verlin Grills, RN since 03/25/2021 12:00 AM     Problem: Disease Progression (COPD)   Priority: Medium  Onset Date: 09/29/2020     Goal: Disease Progression Minimized or Managed   Start Date: 09/29/2020  Expected End Date: 07/31/2021  This Visit's Progress: On track  Priority: Medium  Note:   Evidence-based guidance:  Identify current smoking/tobacco use; provide smoking cessation intervention.  Assess symptom control by the frequency and type of symptoms, reliever use and activity limitation at every encounter.  Assess risk for exacerbation (flare up) by evaluating spirometry, pulse oximetry, reliever use, presentation of symptoms and activity limitation; anticipate treatment adjustment based on  risks and resources.  Develop and/or review and reinforce use of COPD rescue (action) plan even when symptoms are controlled or infrequent.  Ask patient to bring inhaler to all visits; assess and reinforce correct technique; address barriers to proper inhaler use, such as older age, use of multiple devices and lack of understanding.   Identify symptom triggers, such as smoking, virus, weather change, emotional upset, exercise, obesity and  environmental allergen; consider reduction of work-exposure versus elimination to avoid compromising employment.  Correlate presentation to comorbidity, such as diabetes, heart failure, obstructive sleep apnea, depression and anxiety, which may worsen symptoms.  Prepare for individualized pharmacologic therapy that may include LABA (long-acting beta-2 agonist), LAMA (long-acting muscarinic antagonist), SABA (short-acting beta-2 agonist) oral or inhaled corticosteroid.  Promote participation in pulmonary rehabilitation for breathing exercises, skills training, improved exercise capacity, mood and quality of life; address barriers to participation.  Promote physical activity or exercise to improve or maintain exercise capacity, based on tolerance that may include walking, water exercise, cycling or limb muscle strength training.  Promote use of energy conservation and activity pacing techniques.  Promote use of breathing and coughing techniques, such as inspiratory muscle training, pursed-lip breathing, diaphragmatic breathing, pranayama yoga breathing or huff cough.  Screen for malnutrition risk factors, such as unintentional weight loss and poor oral intake; refer to dietitian if identified.  Consider recommendation for oral drink supplement or multivitamin and mineral supplements if suspect inadequate oral intake or micronutrient deficiencies.   Screen for obstructive sleep apnea; prepare patient for polysomnography based on risk and presentation.  Prepare patient for use of long-term oxygen and noninvasive ventilation to relieve hypercapnia, hypoxemia, obstructive sleep apnea and reduce work of breathing.  Prepare patient with worsening disease for surgical interventions that may include bronchoscopy, lung volume reduction surgery, bullectomy or lung transplantation.   Notes:     Task: Alleviate Barriers to COPD Management   Due Date: 07/31/2021  Note:   Care Management Activities:    -  activity or exercise based on tolerance encouraged - communicable disease prevention promoted - rescue (action) plan reviewed - self-awareness of symptom triggers encouraged    Notes:     Problem: Symptom Exacerbation (COPD)   Priority: Medium     Goal: Symptom Exacerbation Prevented or Minimized   Start Date: 09/29/2020  Expected End Date: 07/31/2021  This Visit's Progress: On track  Priority: Medium  Note:   Evidence-based guidance:  Monitor for signs of respiratory infection, including changes in sputum color, volume and thickness, as well as fever.  Encourage infection prevention strategies that may include prophylactic antibiotic therapy for patients with history of frequent exacerbations or antibiotic administration during exacerbation based on presentation, risk and benefit.  Encourage receipt of influenza and pneumococcal vaccine.  Prepare patient for use of home long-term oxygen therapy in presence of sever resting hypoxemia.  Prepare patients for laboratory studies or diagnostic exams, such as spirometry, pulse oximetry and arterial blood gas based on current symptoms, risk factors and presentation.  Assess barriers and manage adherence, including inhaler technique and persistent trigger exposure; encourage adherence, even when symptoms are controlled or infrequent.  Assess and monitor for signs/symptoms of psychosocial concerns, such as shortness of breath-anxiety cycle or depression that may impact stability of symptoms.  Identify economic resources, sociocultural beliefs, social factors and health literacy that may interfere with adherence.  Promote lifestyle changes when needed, including regular physical activity based on tolerance, weight loss, healthy eating and stress management.  Consider referral to nurse or  community Economist or home-visiting program for intensive support and education (disease-management program).  Increase frequency of follow-up following  exacerbation or hospitalization; consider transition of care interventions, such as hospital visit, home visit, telephone follow-up, review of discharge summary and resource referrals.   Notes:     Task: Identify and Minimize Risk of COPD Exacerbation   Due Date: 07/31/2021  Note:   Care Management Activities:    - barriers to lifestyle changes reviewed and addressed - healthy lifestyle promoted - rescue (action) plan reviewed - symptom triggers identified - treatment plan reviewed    Notes:       Goals Addressed             This Visit's Progress    (THN)Manage Fatigue (Tiredness-COPD)   On track    Timeframe:  Long-Range Goal Priority:  Medium Start Date:    [id number]                         Expected End Date:  [id number]                    Follow Up Date [id number]   - eat healthy - get outdoors every day (weather permitting) - use devices that will help like a cane, sock-puller or reacher    Why is this important?   Feeling tired or worn out is a common symptom of COPD (chronic obstructive pulmonary disease).  Learning when you feel your best and when you need rest is important.  Managing the tiredness (fatigue) will help you be active and enjoy life.     Notes:      (THN)Track and Manage My Symptoms-COPD   On track    Timeframe:  Long-Range Goal Priority:  Medium Start Date:     [id number]                        Expected End Date:      [id number]                 Follow Up Date [id number]   - develop a rescue plan - eliminate symptom triggers at home - follow rescue plan if symptoms flare-up - keep follow-up appointments    Why is this important?   Tracking your symptoms and other information about your health helps your doctor plan your care.  Write down the symptoms, the time of day, what you were doing and what medicine you are taking.  You will soon learn how to manage your symptoms.     Notes:  [id number] Patient is monitoring his COPD symptoms and following  action plan     (THN)Track and Manage My Triggers-COPD   On track    Timeframe:  Long-Range Goal Priority:  High Start Date:  [id number]                           Expected End Date:       [id number]                Follow Up Date [id number]   - limit outdoor activity during cold weather - listen for public air quality announcements every day    Why is this important?   Triggers are activities or things, like tobacco smoke or cold weather, that make your COPD (chronic obstructive pulmonary disease) flare-up.  Knowing these triggers helps you plan how to stay  away from them.  When you cannot remove them, you can learn how to manage them.     Notes:         Plan:  Follow-up: Follow-up in 3 month(s) RN provided COPD tips RN ordered free COVID testing kits RN sent update assessment to PCP  [facility] [phone]

## 2021-05-22 DIAGNOSIS — Z23 Encounter for immunization: Secondary | ICD-10-CM | POA: Diagnosis not present

## 2021-06-02 ENCOUNTER — Other Ambulatory Visit: Payer: Self-pay | Admitting: Cardiology

## 2021-06-02 ENCOUNTER — Encounter: Payer: Self-pay | Admitting: Pulmonary Disease

## 2021-06-02 ENCOUNTER — Ambulatory Visit (INDEPENDENT_AMBULATORY_CARE_PROVIDER_SITE_OTHER): Payer: Medicare Other | Admitting: Pulmonary Disease

## 2021-06-02 ENCOUNTER — Other Ambulatory Visit: Payer: Self-pay

## 2021-06-02 VITALS — BP 122/48 | HR 50 | Temp 97.9°F | Ht 70.0 in | Wt 162.2 lb

## 2021-06-02 DIAGNOSIS — J449 Chronic obstructive pulmonary disease, unspecified: Secondary | ICD-10-CM | POA: Diagnosis not present

## 2021-06-02 DIAGNOSIS — R0609 Other forms of dyspnea: Secondary | ICD-10-CM

## 2021-06-02 NOTE — Patient Instructions (Addendum)
Nice to see you again  No medication changes  If you need refills on inhalers or azithromycin please give [place] a call.   Return to clinic in 6 months or sooner as needed

## 2021-06-02 NOTE — Telephone Encounter (Signed)
Prescription refill request for Xarelto received.  Last office visit:Larry Briggs 01/19/21 Weight:76.1kg Age:55m Scr: 1.04 07/10/20 CrCl:57.9

## 2021-06-03 NOTE — Progress Notes (Signed)
@Patient  ID: Larry Briggs, male    DOB: 01/18/1936, 85 y.o.   MRN: [id number]  Chief Complaint  Patient presents with   Follow-up    Patient feels good overall, no concerns    Referring provider: [name], MD  HPI:   Mr. Larry Briggs is a 85 y.o. man with past medical history of tobacco abuse now in remission and severe (FEV1 30%) COPD here for follow-up.  Overall he is doing well.  No exacerbations since last visit.  None in the last 6 months.  He had 2 in the preceding 12 months.  Change at last visit was to add azithromycin low-dose Monday Wednesday Friday.  He reports good adherence of this as well as his inhalers.  Feels like breathing is stable.  [name], not being "sick."  Discussed at length continuing this.  Also discussed need for vaccinations.  He is up-to-date on flu and COVID vaccines with recent booster this year.  Questionaires / Pulmonary Flowsheets:   ACT:  No flowsheet data found.  MMRC: No flowsheet data found.  Epworth:  No flowsheet data found.  Tests:   FENO:  Lab Results  Component Value Date   NITRICOXIDE 30 06/01/2018    PFT: PFT Results Latest Ref Rng & Units 04/26/2016  FVC-Pre L 2.20  FVC-Predicted Pre % 53  FVC-Post L 2.41  FVC-Predicted Post % 58  Pre FEV1/FVC % % 40  Post FEV1/FCV % % 37  FEV1-Pre L 0.89  FEV1-Predicted Pre % 30  FEV1-Post L 0.90  DLCO uncorrected ml/min/mmHg 13.54  DLCO UNC% % 41  DLCO corrected ml/min/mmHg 14.48  DLCO COR %Predicted % 44  DLVA Predicted % 53  TLC L 8.20  TLC % Predicted % 116  RV % Predicted % 191   Severe COPD with severely reduced DLCO on my interpretation.   WALK:  No flowsheet data found.  Imaging: No results found.  Lab Results: Personally reviewed, no history of eosinophilia CBC    Component Value Date/Time   WBC 6.7 07/10/2020 1423   WBC 7.0 09/09/2017 0625   RBC 4.25 07/10/2020 1423   RBC 3.01 (L) 09/09/2017 0625   HGB 14.6  07/10/2020 1423   HCT 41.3 07/10/2020 1423   PLT 205 07/10/2020 1423   MCV 97 07/10/2020 1423   MCH 34.4 (H) 07/10/2020 1423   MCH 31.2 09/09/2017 0625   MCHC 35.4 07/10/2020 1423   MCHC 34.7 09/09/2017 0625   RDW 13.8 07/10/2020 1423   LYMPHSABS 1.2 07/10/2020 1423   MONOABS 0.5 09/04/2017 1047   EOSABS 0.2 07/10/2020 1423   BASOSABS 0.0 07/10/2020 1423    BMET    Component Value Date/Time   NA 139 07/10/2020 1423   K 4.1 07/10/2020 1423   CL 98 07/10/2020 1423   CO2 29 07/10/2020 1423   GLUCOSE 118 (H) 07/10/2020 1423   GLUCOSE 135 (H) 09/09/2017 0625   BUN 14 07/10/2020 1423   CREATININE 1.04 07/10/2020 1423   CREATININE 0.86 03/09/2016 0926   CALCIUM 8.9 07/10/2020 1423   GFRNONAA 67 07/10/2020 1423   GFRAA 77 07/10/2020 1423    BNP No results found for: BNP  ProBNP No results found for: PROBNP  Specialty Problems       Pulmonary Problems   COPD GOLD 4     Quit smoking 1995 - Spirometry  04/26/16   FEV1 0.90 (30%)  Ratio 0.37  No resp to saba p ? Prior  to study - 07/30/2019  After extensive coaching inhaler device,  effectiveness =    75% (short Ti)        Cough    Allergies  Allergen Reactions   Iodinated Diagnostic Agents Nausea And Vomiting and Rash    Immunization History  Administered Date(s) Administered   Fluad Quad(high Dose 65+) 05/18/2019, 05/22/2021   Influenza Inj Mdck Quad Pf 04/20/2018   Influenza Split 04/16/2016   Influenza, High Dose Seasonal PF 05/17/2014, 05/16/2015, 05/02/2020   Influenza, Quadrivalent, Recombinant, Inj, Pf 05/16/2017, 05/01/2018, 05/11/2019, 05/02/2020   Influenza-Unspecified 05/26/2012, 04/20/2013   PFIZER(Purple Top)SARS-COV-2 Vaccination 08/23/2019, 09/13/2019, 05/07/2020, 02/09/2021   Pneumococcal Conjugate-13 06/14/2014   Zoster Recombinat (Shingrix) 05/24/2018, 08/17/2018   Zoster, Live 06/07/2006    Past Medical History:  Diagnosis Date   CAD (coronary artery disease)    Remote MI in 1980. CABG  1995. Stent to left main in 2004. Last cath in April of 2012. EF 50%; patent LIMA to DX/LAD with collateralization of the distal right, patent SVG to OM, occluded SVG to distal RCA, and patent stent to LAD   History of heart attack 1980   Hypercholesteremia    Hypertension    Increased glucose level    Obesity     Tobacco History: Social History   Tobacco Use  Smoking Status Former   Packs/day: 0.75   Years: 40.00   Pack years: 30.00   Types: Cigarettes   Start date: 16   Quit date: 07/02/1994   Years since quitting: 26.9  Smokeless Tobacco Never   Counseling given: Not Answered   Continue to not smoke  Outpatient Encounter Medications as of 06/02/2021  Medication Sig   acetaminophen (TYLENOL) 650 MG CR tablet Take 650 mg by mouth every 8 (eight) hours as needed.   amLODipine (NORVASC) 5 MG tablet Take 1 tablet (5 mg total) by mouth daily.   Bioflavonoid Products (ESTER-C) 500-550 MG TABS Take 500 mg by mouth daily.   chlorthalidone (HYGROTON) 25 MG tablet TAKE 1 TABLET AT BEDTIME (PLEASE CALL [phone] TO MAKE ANNUAL APPOINTMENT WITH DR Martinique FOR FURTHER REFILLS)   docusate sodium (COLACE) 100 MG capsule Take 100 mg by mouth as needed.   doxylamine, Sleep, (UNISOM) 25 MG tablet Take 25 mg by mouth at bedtime as needed.   Fluticasone-Umeclidin-Vilant (TRELEGY ELLIPTA) 100-62.5-25 MCG/INH AEPB INHALE 1 PUFF INTO THE LUNGS DAILY.   guaiFENesin (MUCINEX) 600 MG 12 hr tablet Take by mouth 2 (two) times daily as needed.   metoprolol succinate (TOPROL-XL) 50 MG 24 hr tablet Take 25 mg by mouth daily. Take with or immediately following a meal.   nitroGLYCERIN (NITROSTAT) 0.4 MG SL tablet Place 1 tablet (0.4 mg total) under the tongue as needed.   Omega-3 Fatty Acids (FISH OIL) 500 MG CAPS Take by mouth.   Respiratory Therapy Supplies (FLUTTER) DEVI Use as directed   simvastatin (ZOCOR) 20 MG tablet TAKE 1 TABLET AT BEDTIME (PLEASE CALL [phone] TO MAKE ANNUAL APPOINTMENT  WITH DR Martinique FOR FURTHER REFILLS)   tamsulosin (FLOMAX) 0.4 MG CAPS capsule Take 0.4 mg by mouth daily after supper.   VENTOLIN HFA 108 (90 Base) MCG/ACT inhaler Inhale 2 puffs into the lungs every 4 (four) hours as needed for wheezing or shortness of breath.   XARELTO 20 MG TABS tablet TAKE 1 TABLET EVERY DAY WITH SUPPER   No facility-administered encounter medications on file as of 06/02/2021.      Physical Exam  BP (!) 122/48 (BP Location: Right  Arm, Patient Position: Sitting, Cuff Size: Normal)   Pulse (!) 50   Temp 97.9 F (36.6 C) (Oral)   Ht 5\' 10"  (1.778 m)   Wt 162 lb 3.2 oz (73.6 kg)   SpO2 92%   BMI 23.27 kg/m   Wt Readings from Last 5 Encounters:  06/02/21 162 lb 3.2 oz (73.6 kg)  01/19/21 167 lb 12.8 oz (76.1 kg)  11/27/20 168 lb 12.8 oz (76.6 kg)  10/20/20 167 lb (75.8 kg)  07/10/20 168 lb 3.2 oz (76.3 kg)    BMI Readings from Last 5 Encounters:  06/02/21 23.27 kg/m  01/19/21 24.08 kg/m  11/27/20 24.22 kg/m  10/20/20 23.96 kg/m  07/10/20 24.13 kg/m     Physical Exam General: Sitting up in exam chair, no acute distress Eyes: EOMI, no icterus Respiratory: Distant sounds bilaterally, no wheeze or crackle Cardiovascular: Irregularly irregular, normal rate, no murmur appreciated Extremities: No lower extremity edema, warm   Assessment & Plan:   Dyspnea on exertion: Likely largely driven by severe COPD.  Stable.  Encourage increased exertional capacity as tolerated.  Severe FEV1 30% COPD, Gold category D: Prior frequent exacerbated.  Azithromycin low-dose Monday Wednesday Friday added 11/2020.  No further exacerbations in interim.  To continue this.  To continue inhaler regimen.  Solitary pulmonary nodule: Right lower lobe stable since 07/21/18 with subsequent interval stability 02/20/19 and 08/2020.  No further follow up needed.   Return in about 6 months (around 11/30/2021).   Lanier Clam, MD 06/03/2021

## 2021-06-05 ENCOUNTER — Telehealth: Payer: Self-pay | Admitting: Cardiology

## 2021-06-05 MED ORDER — NITROGLYCERIN 0.4 MG SL SUBL: 0.4000 mg | SUBLINGUAL_TABLET | SUBLINGUAL | 3 refills | Status: DC | PRN

## 2021-06-05 NOTE — Telephone Encounter (Signed)
Noted Nitroglycerin sent to pts pharmacy as directed. Lm, pt was notified that his medication was sent to his pharmacy.

## 2021-06-05 NOTE — Telephone Encounter (Signed)
*  STAT* If patient is at the pharmacy, call can be transferred to refill team.   1. Which medications need to be refilled? (please list name of each medication and dose if known)  nitroGLYCERIN (NITROSTAT) 0.4 MG SL tablet  2. Which pharmacy/location (including street and city if local pharmacy) is medication to be sent to? [place], [place] - [address]  3. Do they need a 30 day or 90 day supply? 30 with refills    Wife said the patient's bottle expired in 2020

## 2021-06-11 ENCOUNTER — Telehealth: Payer: Self-pay | Admitting: Cardiology

## 2021-06-11 MED ORDER — METOPROLOL SUCCINATE ER 50 MG PO TB24
25.0000 mg | ORAL_TABLET | Freq: Every day | ORAL | 0 refills | Status: DC
Start: 2021-06-11 — End: 2021-08-25

## 2021-06-11 NOTE — Telephone Encounter (Signed)
*  STAT* If patient is at the pharmacy, call can be transferred to refill team.   1. Which medications need to be refilled? (please list name of each medication and dose if known) metoprolol succinate (TOPROL-XL) 50 MG 24 hr tablet  2. Which pharmacy/location (including street and city if local pharmacy) is medication to be sent to? [place], [place]  3. Do they need a 30 day or 90 day supply? [place]

## 2021-06-30 ENCOUNTER — Other Ambulatory Visit: Payer: Self-pay | Admitting: *Deleted

## 2021-06-30 NOTE — Patient Outreach (Signed)
[facility]) Care Management  06/30/2021  CLIMMIE BUELOW 08-Sep-1935 [id number]   RN Health Coach telephone call to patient.  Hipaa compliance verified. Per patient he was getting ready to leave and would like a call back at later time.   Plan: RN will follow up within 30 days  [place] [facility] [phone]

## 2021-07-27 ENCOUNTER — Other Ambulatory Visit: Payer: Self-pay | Admitting: Cardiology

## 2021-07-28 ENCOUNTER — Other Ambulatory Visit: Payer: Self-pay | Admitting: *Deleted

## 2021-07-28 NOTE — Patient Outreach (Signed)
[facility]) Care Management  07/28/2021  ALPHONSA BRICKLE Apr 08, 1936 [id number]   RN Health Coach attempted follow up outreach call to patient.  Patient was unavailable. No voicemail picked up.  Plan: RN will call patient again within 30 days.  [place] Care Management [phone]

## 2021-08-06 ENCOUNTER — Other Ambulatory Visit: Payer: Self-pay | Admitting: *Deleted

## 2021-08-06 NOTE — Patient Outreach (Signed)
[facility]) Care Management [place] Note   08/06/2021 Name:  Larry Briggs MRN:  [id number] DOB:  [birth year]  Summary: Per patient he is doing good. Per patient no changes in his breathing. He is taking his medications as per ordered.  Patient has not had any recent falls. Per patient his appetite is fair. Patient refused boost or ensure nutritional supplements.   Recommendations/Changes made from today's visit: Patient will continue to use inhalers as ordered Patient will monitor for COPD exacerbation   Subjective: Larry Briggs is an 86 y.o. year old male who is a primary patient of [name], MD. The care management team was consulted for assistance with care management and/or care coordination needs.    RN Health Coach completed Telephone Visit today.   Objective:  Medications Reviewed Today     Reviewed by Lanier Clam, MD (Physician) on 06/02/21 at 1653  Med List Status: <None>   Medication Order Taking? Sig Documenting Provider Last Dose Status Informant  acetaminophen (TYLENOL) 650 MG CR tablet [id number] Yes Take 650 mg by mouth every 8 (eight) hours as needed. [provider] Taking Active   amLODipine (NORVASC) 5 MG tablet [id number] Yes Take 1 tablet (5 mg total) by mouth daily. Martinique, Peter M, MD Taking Active   Bioflavonoid Products (ESTER-C) 500-550 MG TABS [id number] Yes Take 500 mg by mouth daily. [provider] Taking Active Self  chlorthalidone (HYGROTON) 25 MG tablet [id number] Yes TAKE 1 TABLET AT BEDTIME (PLEASE CALL [phone] TO MAKE ANNUAL APPOINTMENT WITH DR Martinique FOR FURTHER REFILLS) Martinique, Peter M, MD Taking Active   docusate sodium (COLACE) 100 MG capsule [id number] Yes Take 100 mg by mouth as needed. [provider] Taking Active   doxylamine, Sleep, (UNISOM) 25 MG tablet [id number] Yes Take 25 mg by mouth at bedtime as needed. [provider] Taking Active    Fluticasone-Umeclidin-Vilant (TRELEGY ELLIPTA) 100-62.5-25 MCG/INH AEPB [id number] Yes INHALE 1 PUFF INTO THE LUNGS DAILY. Hunsucker, Bonna Gains, MD Taking Active   guaiFENesin (MUCINEX) 600 MG 12 hr tablet [id number] Yes Take by mouth 2 (two) times daily as needed. [provider] Taking Active   metoprolol succinate (TOPROL-XL) 50 MG 24 hr tablet [id number] Yes Take 25 mg by mouth daily. Take with or immediately following a meal. [provider] Taking Active   nitroGLYCERIN (NITROSTAT) 0.4 MG SL tablet [id number] Yes Place 1 tablet (0.4 mg total) under the tongue as needed. Martinique, Peter M, MD Taking Active   Omega-3 Fatty Acids (FISH OIL) 500 MG CAPS [id number] Yes Take by mouth. [provider] Taking Active   Respiratory Therapy Supplies (FLUTTER) DEVI [id number] Yes Use as directed Tanda Rockers, MD Taking Active   simvastatin (ZOCOR) 20 MG tablet [id number] Yes TAKE 1 TABLET AT BEDTIME (PLEASE CALL [phone] TO MAKE ANNUAL APPOINTMENT WITH DR Martinique FOR FURTHER REFILLS) Martinique, Peter M, MD Taking Active   tamsulosin [facility]) 0.4 MG CAPS capsule [id number] Yes Take 0.4 mg by mouth daily after supper. [provider] Taking Active Self  VENTOLIN HFA 108 (90 Base) MCG/ACT inhaler [id number] Yes Inhale 2 puffs into the lungs every 4 (four) hours as needed for wheezing or shortness of breath. Hunsucker, Bonna Gains, MD Taking Active   XARELTO 20 MG TABS tablet [id number] Yes TAKE 1 TABLET EVERY DAY WITH SUPPER Martinique, Peter M, MD Taking Active              SDOH:  (Social Determinants of Health)  assessments and interventions performed:  SDOH Interventions    Flowsheet Row Most Recent Value  SDOH Interventions   Food Insecurity Interventions Intervention Not Indicated  Housing Interventions Intervention Not Indicated  Transportation Interventions Intervention Not Indicated       Care Plan  Review of patient past medical history, allergies, medications,  health status, including review of consultants reports, laboratory and other test data, was performed as part of comprehensive evaluation for care management services.   Care Plan : COPD (Adult)  Updates made by Verlin Grills, RN since 08/06/2021 12:00 AM     Problem: Disease Progression (COPD) Resolved 08/06/2021  Priority: Medium  Onset Date: 09/29/2020  Note:   [id number] Resolving due to duplicate goal     Goal: Disease Progression Minimized or Managed Completed 08/06/2021  Start Date: 09/29/2020  Expected End Date: 07/31/2021  Recent Progress: On track  Priority: Medium  Note:   Evidence-based guidance:  Identify current smoking/tobacco use; provide smoking cessation intervention.  Assess symptom control by the frequency and type of symptoms, reliever use and activity limitation at every encounter.  Assess risk for exacerbation (flare up) by evaluating spirometry, pulse oximetry, reliever use, presentation of symptoms and activity limitation; anticipate treatment adjustment based on risks and resources.  Develop and/or review and reinforce use of COPD rescue (action) plan even when symptoms are controlled or infrequent.  Ask patient to bring inhaler to all visits; assess and reinforce correct technique; address barriers to proper inhaler use, such as older age, use of multiple devices and lack of understanding.   Identify symptom triggers, such as smoking, virus, weather change, emotional upset, exercise, obesity and environmental allergen; consider reduction of work-exposure versus elimination to avoid compromising employment.  Correlate presentation to comorbidity, such as diabetes, heart failure, obstructive sleep apnea, depression and anxiety, which may worsen symptoms.  Prepare for individualized pharmacologic therapy that may include LABA (long-acting beta-2 agonist), LAMA (long-acting muscarinic antagonist), SABA (short-acting beta-2 agonist) oral or inhaled corticosteroid.   Promote participation in pulmonary rehabilitation for breathing exercises, skills training, improved exercise capacity, mood and quality of life; address barriers to participation.  Promote physical activity or exercise to improve or maintain exercise capacity, based on tolerance that may include walking, water exercise, cycling or limb muscle strength training.  Promote use of energy conservation and activity pacing techniques.  Promote use of breathing and coughing techniques, such as inspiratory muscle training, pursed-lip breathing, diaphragmatic breathing, pranayama yoga breathing or huff cough.  Screen for malnutrition risk factors, such as unintentional weight loss and poor oral intake; refer to dietitian if identified.  Consider recommendation for oral drink supplement or multivitamin and mineral supplements if suspect inadequate oral intake or micronutrient deficiencies.   Screen for obstructive sleep apnea; prepare patient for polysomnography based on risk and presentation.  Prepare patient for use of long-term oxygen and noninvasive ventilation to relieve hypercapnia, hypoxemia, obstructive sleep apnea and reduce work of breathing.  Prepare patient with worsening disease for surgical interventions that may include bronchoscopy, lung volume reduction surgery, bullectomy or lung transplantation.   Notes:     Task: Alleviate Barriers to COPD Management Completed 08/06/2021  Due Date: 07/31/2021  Note:   Care Management Activities:    - activity or exercise based on tolerance encouraged - communicable disease prevention promoted - rescue (action) plan reviewed - self-awareness of symptom triggers encouraged    Notes:     Problem: Symptom Exacerbation (COPD) Resolved 08/06/2021  Priority: Medium  Note:   [id number] Resolving  due to duplicate goal     Goal: Symptom Exacerbation Prevented or Minimized Completed 08/06/2021  Start Date: 09/29/2020  Expected End Date: 07/31/2021  Recent  Progress: On track  Priority: Medium  Note:   Evidence-based guidance:  Monitor for signs of respiratory infection, including changes in sputum color, volume and thickness, as well as fever.  Encourage infection prevention strategies that may include prophylactic antibiotic therapy for patients with history of frequent exacerbations or antibiotic administration during exacerbation based on presentation, risk and benefit.  Encourage receipt of influenza and pneumococcal vaccine.  Prepare patient for use of home long-term oxygen therapy in presence of sever resting hypoxemia.  Prepare patients for laboratory studies or diagnostic exams, such as spirometry, pulse oximetry and arterial blood gas based on current symptoms, risk factors and presentation.  Assess barriers and manage adherence, including inhaler technique and persistent trigger exposure; encourage adherence, even when symptoms are controlled or infrequent.  Assess and monitor for signs/symptoms of psychosocial concerns, such as shortness of breath-anxiety cycle or depression that may impact stability of symptoms.  Identify economic resources, sociocultural beliefs, social factors and health literacy that may interfere with adherence.  Promote lifestyle changes when needed, including regular physical activity based on tolerance, weight loss, healthy eating and stress management.  Consider referral to nurse or community health worker or home-visiting program for intensive support and education (disease-management program).  Increase frequency of follow-up following exacerbation or hospitalization; consider transition of care interventions, such as hospital visit, home visit, telephone follow-up, review of discharge summary and resource referrals.   Notes:     Task: Identify and Minimize Risk of COPD Exacerbation Completed 08/06/2021  Due Date: 07/31/2021  Note:   Care Management Activities:    - barriers to lifestyle changes reviewed and  addressed - healthy lifestyle promoted - rescue (action) plan reviewed - symptom triggers identified - treatment plan reviewed    Notes:     Care Plan : [place] of Care  Updates made by Tsuruko Murtha, Eppie Gibson, RN since 08/06/2021 12:00 AM     Problem: Knowledge Deficit Related to COPD and Care Coordination Needs      Long-Range Goal: Development Plan of Care for [place] of COPD   Start Date: 08/06/2021  Expected End Date: 07/31/2022  Priority: High  Note:   Current Barriers:  Knowledge Deficits related to plan of care for management of COPD   RNCM Clinical Goal(s):  Patient will verbalize understanding of plan for management of COPD as evidenced by continuation of monitoring for symptoms of COPD exacerbation  through collaboration with RN Care manager, provider, and care team.   Interventions: Inter-disciplinary care team collaboration (see longitudinal plan of care) Evaluation of current treatment plan related to  self management and patient's adherence to plan as established by provider  Patient Goals/Self-Care Activities: Take medications as prescribed   Attend all scheduled provider appointments Call pharmacy for medication refills 3-7 days in advance of running out of medications Perform all self care activities independently  Perform IADL's (shopping, preparing meals, housekeeping, managing finances) independently Call provider office for new concerns or questions  call the Suicide and Crisis Lifeline: 988 if experiencing a Mental Health or [place]  - identify and remove indoor air pollutants - limit outdoor activity during cold weather - listen for public air quality announcements every day - develop a rescue plan - eliminate symptom triggers at home - follow rescue plan if symptoms flare-up - keep follow-up appointments: PCP and  Pulmonologist - develop a new routine to improve sleep        Plan: Telephone follow up appointment with  care management team member scheduled for:  April. The patient has been provided with contact information for the care management team and has been advised to call with any health related questions or concerns.     [place] Care Management [phone]

## 2021-08-06 NOTE — Patient Instructions (Signed)
Visit Information  Thank you for taking time to visit with me today. Please don't hesitate to contact me if I can be of assistance to you before our next scheduled telephone appointment.  Following are the goals we discussed today:  Current Barriers:  Knowledge Deficits related to plan of care for management of COPD   RNCM Clinical Goal(s):  Patient will verbalize understanding of plan for management of COPD as evidenced by continuation of monitoring for symptoms of COPD exacerbation through collaboration with RN Care manager, provider, and care team.   Interventions: Inter-disciplinary care team collaboration (see longitudinal plan of care) Evaluation of current treatment plan related to  self management and patient's adherence to plan as established by provider  Patient Goals/Self-Care Activities: Take medications as prescribed   Attend all scheduled provider appointments Call pharmacy for medication refills 3-7 days in advance of running out of medications Perform all self care activities independently  Perform IADL's (shopping, preparing meals, housekeeping, managing finances) independently Call provider office for new concerns or questions  call the Suicide and Crisis Lifeline: 988 if experiencing a Mental Health or [place]  - identify and remove indoor air pollutants - limit outdoor activity during cold weather - listen for public air quality announcements every day - develop a rescue plan - eliminate symptom triggers at home - follow rescue plan if symptoms flare-up - keep follow-up appointments: PCP and Pulmonologist - develop a new routine to improve sleep    Our next appointment is by telephone on April 2023  Please call the care guide team at [phone] if you need to cancel or reschedule your appointment.   Please call the Suicide and Crisis Lifeline: 988 if you are experiencing a Mental Health or [place] or need someone to talk  to.  The patient verbalized understanding of instructions, educational materials, and care plan provided today and agreed to receive a mailed copy of patient instructions, educational materials, and care plan.   Telephone follow up appointment with care management team member scheduled for: The patient has been provided with contact information for the care management team and has been advised to call with any health related questions or concerns.   [place] Care Management [phone]

## 2021-08-12 ENCOUNTER — Other Ambulatory Visit: Payer: Self-pay | Admitting: Pulmonary Disease

## 2021-08-12 DIAGNOSIS — J449 Chronic obstructive pulmonary disease, unspecified: Secondary | ICD-10-CM

## 2021-08-14 NOTE — Progress Notes (Signed)
Larry Briggs Date of Birth: Feb 16, 1936 Medical Record #[id number]  History of Present Illness: Larry Briggs is seen back today for follow up CAD. He has a known history of coronary disease and status post coronary bypass surgery in 1995. He underwent stenting of the left main coronary in 2004. His last cardiac catheterization in April 2012 showed that everything was patent except for a vein graft to the distal right coronary. The PDA was supplied by left to right collaterals. He has been followed in our HTN clinic. He was having side effects on medications. This improved with stopping lisinopril and doxazosin and starting on valsartan. He does have severe COPD. His valsartan was later switched to irbesartan and then this was later discontinued.   He was  admitted in February 2019 with left hip fracture after a mechanical fall.  He underwent intramedullary nail in the left intertrochanteric hip.  EKG showed with new T wave inversion in anterolateral leads in the setting of tachycardia and anemia.  Cardiology was consulted for abnormal EKG.  Patient however denied any anginal symptom.  Echocardiogram obtained on 09/07/2017 showed EF 50-55%, grade 1 DD, trivial pericardial effusion.  Overall poor image.  Serial troponins were negative.  Suspicion for angina was low.  Overnight, he went into atrial fibrillation with RVR which was new.  He was not a anticoagulation candidate given significant bleeding risk after surgery.  The plan was to address anticoagulation therapy on follow-up. He subsequently converted to NSR. 30 day event monitor was recommended to see if he had any further Afib. This did demonstrate paroxysmal Afib and he was started on Xarelto. ASA was discontinued.   Last spring he called with complaints of sustained bradycardia with HR in the 40s. Metoprolol was reduced. Event monitor showed PVCs but no significant bradycardia and no Afib.   On follow up today he is doing well. No chest pain or  palpitations. Does note a little more SOB in the last 3 weeks. No wheezing or cough.  No edema. Hasn't used albuterol very often.   Current Outpatient Medications on File Prior to Visit  Medication Sig Dispense Refill   acetaminophen (TYLENOL) 650 MG CR tablet Take 650 mg by mouth every 8 (eight) hours as needed.     amLODipine (NORVASC) 5 MG tablet Take 1 tablet (5 mg total) by mouth daily. 90 tablet 3   Bioflavonoid Products (ESTER-C) 500-550 MG TABS Take 500 mg by mouth daily.     chlorthalidone (HYGROTON) 25 MG tablet TAKE 1 TABLET AT BEDTIME (PLEASE CALL [phone] TO MAKE ANNUAL APPOINTMENT WITH DR Martinique FOR FURTHER REFILLS) 90 tablet 3   docusate sodium (COLACE) 100 MG capsule Take 100 mg by mouth as needed.     doxylamine, Sleep, (UNISOM) 25 MG tablet Take 25 mg by mouth at bedtime as needed.     Fluticasone-Umeclidin-Vilant (TRELEGY ELLIPTA) 100-62.5-25 MCG/INH AEPB INHALE 1 PUFF INTO THE LUNGS DAILY. 180 each 3   guaiFENesin (MUCINEX) 600 MG 12 hr tablet Take by mouth 2 (two) times daily as needed.     nitroGLYCERIN (NITROSTAT) 0.4 MG SL tablet Place 1 tablet (0.4 mg total) under the tongue as needed. 30 tablet 3   Omega-3 Fatty Acids (FISH OIL) 500 MG CAPS Take by mouth.     Respiratory Therapy Supplies (FLUTTER) DEVI Use as directed 1 each 0   simvastatin (ZOCOR) 20 MG tablet TAKE 1 TABLET AT BEDTIME (PLEASE CALL [phone] TO MAKE ANNUAL APPOINTMENT WITH DR Martinique FOR FURTHER REFILLS)  90 tablet 3   tamsulosin (FLOMAX) 0.4 MG CAPS capsule Take 0.4 mg by mouth daily after supper.     VENTOLIN HFA 108 (90 Base) MCG/ACT inhaler INHALE 2 PUFFS BY MOUTH EVERY 4 HOURS AS NEEDED FOR WHEEZING FOR SHORTNESS OF BREATH 18 g 4   XARELTO 20 MG TABS tablet TAKE 1 TABLET EVERY DAY WITH SUPPER 90 tablet 1   No current facility-administered medications on file prior to visit.    Allergies  Allergen Reactions   Iodinated Contrast Media Nausea And Vomiting and Rash    Past Medical History:   Diagnosis Date   CAD (coronary artery disease)    Remote MI in 1980. CABG 1995. Stent to left main in 2004. Last cath in April of 2012. EF 50%; patent LIMA to DX/LAD with collateralization of the distal right, patent SVG to OM, occluded SVG to distal RCA, and patent stent to LAD   History of heart attack 1980   Hypercholesteremia    Hypertension    Increased glucose level    Obesity     Past Surgical History:  Procedure Laterality Date   [place]  April 2012   Mild reduction if EF at 50%. Patent LIMA to DX/LAD with collateralization to distal RCA, patent SVG to OM and occluded SVG to distal right and patent stent to LAD/Left main;   CORONARY ARTERY BYPASS GRAFT  05/1994   x5 LIMA to LAD & DX, SVG to LCX, SVG to RCA   CORONARY STENT PLACEMENT  2004   Stent to the L main   INTRAMEDULLARY (IM) NAIL INTERTROCHANTERIC Left 09/05/2017   Procedure: INTRAMEDULLARY (IM) NAIL INTERTROCHANTRIC;  Surgeon: Renette Butters, MD;  Location: [place];  Service: Orthopedics;  Laterality: Left;    Social History   Tobacco Use  Smoking Status Former   Packs/day: 0.75   Years: 40.00   Pack years: 30.00   Types: Cigarettes   Start date: 36   Quit date: 07/02/1994   Years since quitting: 27.1  Smokeless Tobacco Never    Social History   Substance and Sexual Activity  Alcohol Use No    Family History  Problem Relation Age of Onset   Heart attack Father    Hypertension Father    Stroke Mother     Review of Systems: The review of systems is per the HPI.  All other systems were reviewed and are negative.  Physical Exam: BP (!) 110/52 (BP Location: Left Arm, Patient Position: Sitting, Cuff Size: Normal)    Pulse 67    Resp 20    Ht 5\' 10"  (1.778 m)    Wt 160 lb (72.6 kg)    SpO2 98%    BMI 22.96 kg/m  GENERAL:  Well appearing elderly WM in NAD HEENT:  PERRL, EOMI, sclera are clear. Oropharynx is clear. NECK:  No jugular venous distention,  carotid upstroke brisk and symmetric, no bruits, no thyromegaly or adenopathy LUNGS:  Clear. Decreased BS- no rales or wheezes.  CHEST:  Unremarkable HEART:  RRR with extrasystoles,  PMI not displaced or sustained,S1 and S2 within normal limits, no S3, no S4: no clicks, no rubs, no murmurs ABD:  Soft, nontender. BS +, no masses or bruits. No hepatomegaly, no splenomegaly EXT:  2 + pulses throughout, no edema, no cyanosis no clubbing SKIN:  Warm and dry.  No rashes NEURO:  Alert and oriented x 3. Cranial nerves II through XII intact. PSYCH:  Cognitively intact  LABORATORY DATA:   Lab Results  Component Value Date   WBC 6.7 07/10/2020   HGB 14.6 07/10/2020   HCT 41.3 07/10/2020   PLT 205 07/10/2020   GLUCOSE 118 (H) 07/10/2020   CHOL 145 07/10/2020   TRIG 71 07/10/2020   HDL 64 07/10/2020   LDLCALC 67 07/10/2020   ALT 10 07/10/2020   AST 11 07/10/2020   NA 139 07/10/2020   K 4.1 07/10/2020   CL 98 07/10/2020   CREATININE 1.04 07/10/2020   BUN 14 07/10/2020   CO2 29 07/10/2020   TSH 1.470 07/10/2020   INR 1.05 09/04/2017   Labs reviewed from 07/22/16: cholesterol 174, triglycerides 140, LDL 82, HDL 64. CMET normal Except creatinine reported as 242 (this has to be an error).  Dated 01/25/17: A1c 5.9%.  Dated 07/27/17: cholesterol 146, triglycerides 104, HDL 56, LDL 69.  Dated 08/03/18: cholesterol 151, triglycerides 102, HDL 58, LDL 73. CMET normal Dated 08/08/19: Triglycerides 131, HDL 64.  Ecg not done today   Echo 09/07/17: Study Conclusions   - Left ventricle: The cavity size was normal. Wall thickness was   increased in a pattern of mild LVH. Systolic function was normal.   The estimated ejection fraction was in the range of 50% to 55%.   Doppler parameters are consistent with abnormal left ventricular   relaxation (grade 1 diastolic dysfunction). - Aortic valve: Mildly calcified annulus. - Right atrium: The atrium was mildly dilated. - Pericardium, extracardiac:  A trivial pericardial effusion was   identified.   Impressions:   - Technically difficult echo   Image quality is poor.  Event monitor 09/26/17: Study Highlights   Normal sinus rhythm Atrial fibrillation- paroxysmal with controlled ventricular response Isolated PVCs     Event monitor 11/18/20: Study Highlights    Normal sinus rhythm. average HR 83. predominantly first degree AV block with infrequent second degree AV block Mobitz type 1 Frequent PVCs with bigeminy and NSVT- longest episode 16 beats. Brief runs of PAT. longest 6 beats. No Afib seen No sustained bradycardia or pauses.    Assessment / Plan: 1. CAD s/p CABG 1995. S/p stenting of the left main coronary artery 2004.  Cath 2012 showed occlusion of SVG to diagonal. He remains asymptomatic. Continue current anti-anginal Rx.   2. Paroxysmal Afib. some degree ov tachy-brady syndrome. Follow up event monitor showed PVCs and some NSVT but no significant bradycardia or Afib.   Now on Xarelto for anticoagulation. Tolerating it well. Continue current therapy.   3.  HTN- well controlled now on current regimen.  4. Hyperlipidemia.  Continue statin and fish oil. At goal. Plan follow up lab with Dr Shelia Media.   5. Severe COPD with recurrent bronchitis. - followed by pulmonary. Improved on Trelegy. Use albuterol PRN    Follow up in 6 months.

## 2021-08-24 DIAGNOSIS — R350 Frequency of micturition: Secondary | ICD-10-CM | POA: Diagnosis not present

## 2021-08-24 DIAGNOSIS — N5201 Erectile dysfunction due to arterial insufficiency: Secondary | ICD-10-CM | POA: Diagnosis not present

## 2021-08-25 ENCOUNTER — Other Ambulatory Visit: Payer: Self-pay

## 2021-08-25 MED ORDER — METOPROLOL SUCCINATE ER 50 MG PO TB24: 25.0000 mg | ORAL_TABLET | Freq: Every day | ORAL | 3 refills | Status: DC

## 2021-08-28 ENCOUNTER — Ambulatory Visit (INDEPENDENT_AMBULATORY_CARE_PROVIDER_SITE_OTHER): Payer: Medicare Other | Admitting: Cardiology

## 2021-08-28 ENCOUNTER — Other Ambulatory Visit: Payer: Self-pay

## 2021-08-28 ENCOUNTER — Encounter: Payer: Self-pay | Admitting: Cardiology

## 2021-08-28 VITALS — BP 110/52 | HR 67 | Resp 20 | Ht 70.0 in | Wt 160.0 lb

## 2021-08-28 DIAGNOSIS — J449 Chronic obstructive pulmonary disease, unspecified: Secondary | ICD-10-CM

## 2021-08-28 DIAGNOSIS — I10 Essential (primary) hypertension: Secondary | ICD-10-CM

## 2021-08-28 DIAGNOSIS — I480 Paroxysmal atrial fibrillation: Secondary | ICD-10-CM

## 2021-08-28 DIAGNOSIS — I25810 Atherosclerosis of coronary artery bypass graft(s) without angina pectoris: Secondary | ICD-10-CM | POA: Diagnosis not present

## 2021-08-28 MED ORDER — METOPROLOL SUCCINATE ER 25 MG PO TB24: 25.0000 mg | ORAL_TABLET | Freq: Every day | ORAL | 3 refills | Status: DC

## 2021-10-12 DIAGNOSIS — Z23 Encounter for immunization: Secondary | ICD-10-CM | POA: Diagnosis not present

## 2021-10-26 ENCOUNTER — Other Ambulatory Visit: Payer: Self-pay | Admitting: Cardiology

## 2021-11-02 ENCOUNTER — Ambulatory Visit: Payer: Medicare Other | Admitting: *Deleted

## 2021-11-02 ENCOUNTER — Other Ambulatory Visit: Payer: Self-pay | Admitting: *Deleted

## 2021-11-02 NOTE — Patient Outreach (Signed)
[facility]) Care Management ? ?11/02/2021 ? ?[name] ?03/01/1936 ?[id number] ? ?RN Health Coach attempted follow up outreach call to patient.  Patient was unavailable. HIPPA compliance voicemail message left with return callback number. ? ?Plan: ?RN will call patient again within 30 days. ? ?Johny Shock BSN RN ?[place] Management ?[phone] ? ? ?

## 2021-11-03 NOTE — Patient Instructions (Signed)
Visit Information ? ?Thank you for taking time to visit with me today. Please don't hesitate to contact me if I can be of assistance to you before our next scheduled telephone appointment. ? ?Following are the goals we discussed today:  ?Current Barriers:  ?Knowledge Deficits related to plan of care for management of COPD  ? ?RNCM Clinical Goal(s):  ?Patient will verbalize understanding of plan for management of COPD as evidenced by continuation of monitoring for symptoms of COPD exacerbation through collaboration with RN Care manager, provider, and care team.  ? ?Interventions: ?Inter-disciplinary care team collaboration (see longitudinal plan of care) ?Evaluation of current treatment plan related to  self management and patient's adherence to plan as established by provider ? ?Patient Goals/Self-Care Activities: ?Take medications as prescribed   ?Attend all scheduled provider appointments ?Call pharmacy for medication refills 3-7 days in advance of running out of medications ?Perform all self care activities independently  ?Perform IADL's (shopping, preparing meals, housekeeping, managing finances) independently ?Call provider office for new concerns or questions  ?call the Suicide and Crisis Lifeline: 988 if experiencing a Mental Health or [place]  ?- identify and remove indoor air pollutants ?- limit outdoor activity during cold weather ?- listen for public air quality announcements every day ?- develop a rescue plan ?- eliminate symptom triggers at home ?- follow rescue plan if symptoms flare-up ?- keep follow-up appointments: PCP and Pulmonologist ?- develop a new routine to improve sleep ?  ?[id number] Per patient he has not had any COPD exacerbations since last outreach. He stated his breathing is doing good. He is taking medications as per ordered. He has not had any recent falls.  He uses a cane for balance. His appetite is fair. ? ?Our next appointment is by telephone on February 08, 2022 ? ?Please call Johny Shock RN [phone]  if you need to cancel or reschedule your appointment.  ? ?Please call the Suicide and Crisis Lifeline: 988 if you are experiencing a Mental Health or [place] or need someone to talk to. ? ?The patient verbalized understanding of instructions, educational materials, and care plan provided today and agreed to receive a mailed copy of patient instructions, educational materials, and care plan.  ? ?Telephone follow up appointment with care management team member scheduled for: ?The patient has been provided with contact information for the care management team and has been advised to call with any health related questions or concerns.  ? ?SIGNATURE ? ?Johny Shock BSN RN ?[facility] ?[phone] ? ? ?  ?

## 2021-11-03 NOTE — Patient Outreach (Signed)
[facility]) Care Management ?RN Health Coach Note ? ? ?11/03/2021 ?Name:  Larry Briggs MRN:  [id number] DOB:  January 25, 1936 ? ?Summary: ?No COPD exacerbation admission since last outreach. Patient is taking medications as per ordered. Per patient not recent falls.   ? ?Recommendations/Changes made from today's visit: ?Medication adherence ?Monitor for COPD exacerbation ? ? ? ?Subjective: ?Larry Briggs is an 86 y.o. year old male who is a primary patient of Larry Pretty, MD. The care management team was consulted for assistance with care management and/or care coordination needs.   ? ?RN Health Coach completed Telephone Visit today.  ? ?Objective: ? ?Medications Reviewed Today   ? ? Reviewed by Martinique, Peter M, MD (Physician) on 08/28/21 at [place] List Status: <None>  ? ?Medication Order Taking? Sig Documenting Provider Last Dose Status Informant  ?acetaminophen (TYLENOL) 650 MG CR tablet [id number] Yes Take 650 mg by mouth every 8 (eight) hours as needed. [provider] Taking Active   ?amLODipine (NORVASC) 5 MG tablet [id number] Yes Take 1 tablet (5 mg total) by mouth daily. Martinique, Peter M, MD Taking Active   ?Bioflavonoid Products (ESTER-C) 500-550 MG TABS [id number] Yes Take 500 mg by mouth daily. [provider] Taking Active Self  ?chlorthalidone (HYGROTON) 25 MG tablet [id number] Yes TAKE 1 TABLET AT BEDTIME (PLEASE CALL [phone] TO MAKE ANNUAL APPOINTMENT WITH DR Martinique FOR FURTHER REFILLS) Martinique, Peter M, MD Taking Active   ?docusate sodium (COLACE) 100 MG capsule [id number] Yes Take 100 mg by mouth as needed. [provider] Taking Active   ?doxylamine, Sleep, (UNISOM) 25 MG tablet [id number] Yes Take 25 mg by mouth at bedtime as needed. [provider] Taking Active   ?Fluticasone-Umeclidin-Vilant (TRELEGY ELLIPTA) 100-62.5-25 MCG/INH AEPB [id number] Yes INHALE 1 PUFF INTO THE LUNGS DAILY. Hunsucker, Bonna Gains, MD Taking Active   ?guaiFENesin  (MUCINEX) 600 MG 12 hr tablet [id number] Yes Take by mouth 2 (two) times daily as needed. [provider] Taking Active   ?metoprolol succinate (TOPROL-XL) 50 MG 24 hr tablet [id number] Yes Take 0.5 tablets (25 mg total) by mouth daily. Take with or immediately following a meal. Martinique, Peter M, MD Taking Active   ?nitroGLYCERIN (NITROSTAT) 0.4 MG SL tablet [id number] Yes Place 1 tablet (0.4 mg total) under the tongue as needed. Martinique, Peter M, MD Taking Active   ?Omega-3 Fatty Acids (FISH OIL) 500 MG CAPS [id number] Yes Take by mouth. [provider] Taking Active   ?Respiratory Therapy Supplies (FLUTTER) DEVI [id number] Yes Use as directed Larry Rockers, MD Taking Active   ?simvastatin (ZOCOR) 20 MG tablet [id number] Yes TAKE 1 TABLET AT BEDTIME (PLEASE CALL [phone] TO MAKE ANNUAL APPOINTMENT WITH DR Martinique FOR FURTHER REFILLS) Martinique, Peter M, MD Taking Active   ?tamsulosin [facility]) 0.4 MG CAPS capsule [id number] Yes Take 0.4 mg by mouth daily after supper. [provider] Taking Active Self  ?VENTOLIN HFA 108 (90 Base) MCG/ACT inhaler [id number] Yes INHALE 2 PUFFS BY MOUTH EVERY 4 HOURS AS NEEDED FOR WHEEZING FOR SHORTNESS OF BREATH Hunsucker, Bonna Gains, MD Taking Active   ?XARELTO 20 MG TABS tablet [id number] Yes TAKE 1 TABLET EVERY DAY WITH SUPPER Martinique, Peter M, MD Taking Active   ? ?  ?  ? ?  ? ? ? ?SDOH:  (Social Determinants of Health) assessments and interventions performed:  ? ? ?Care Plan ? ?Review of patient past medical history, allergies, medications, health status, including review of consultants reports, laboratory and  other test data, was performed as part of comprehensive evaluation for care management services.  ? ?Care Plan : RN Care Manager Plan of Care  ?Updates made by Larry Briggs, Larry Gibson, RN since 11/03/2021 12:00 AM  ?  ? ?Problem: Knowledge Deficit Related to COPD and Care Coordination Needs   ?  ? ?Long-Range Goal: Development Plan of Care for Manangement of COPD    ?Start Date: 08/06/2021  ?Expected End Date: 07/31/2022  ?Priority: High  ?Note:   ?Current Barriers:  ?Knowledge Deficits related to plan of care for management of COPD  ? ?RNCM Clinical Goal(s):  ?Patient will verbalize understanding of plan for management of COPD as evidenced by continuation of monitoring for symptoms of COPD exacerbation  through collaboration with RN Care manager, provider, and care team.  ? ?Interventions: ?Inter-disciplinary care team collaboration (see longitudinal plan of care) ?Evaluation of current treatment plan related to  self management and patient's adherence to plan as established by provider ? ?Patient Goals/Self-Care Activities: ?Take medications as prescribed   ?Attend all scheduled provider appointments ?Call pharmacy for medication refills 3-7 days in advance of running out of medications ?Perform all self care activities independently  ?Perform IADL's (shopping, preparing meals, housekeeping, managing finances) independently ?Call provider office for new concerns or questions  ?call the Suicide and Crisis Lifeline: 988 if experiencing a Mental Health or [place]  ?- identify and remove indoor air pollutants ?- limit outdoor activity during cold weather ?- listen for public air quality announcements every day ?- develop a rescue plan ?- eliminate symptom triggers at home ?- follow rescue plan if symptoms flare-up ?- keep follow-up appointments: PCP and Pulmonologist ?- develop a new routine to improve sleep ?  ?[id number] Per patient he has not had any COPD exacerbations since last outreach. He stated his breathing is doing good. He is taking medications as per ordered. He has not had any recent falls.  He uses a cane for balance. His appetite is fair. ?  ?  ? ?Plan: Telephone follow up appointment with care management team member scheduled for:  February 08, 2022 ?The patient has been provided with contact information for the care management team and has been advised  to call with any health related questions or concerns.  ? ?Johny Shock BSN RN ?[facility] ?[phone] ? ? ? ?  ?

## 2021-11-05 DIAGNOSIS — Z961 Presence of intraocular lens: Secondary | ICD-10-CM | POA: Diagnosis not present

## 2021-11-09 ENCOUNTER — Ambulatory Visit: Payer: Medicare Other | Admitting: *Deleted

## 2021-12-02 ENCOUNTER — Other Ambulatory Visit: Payer: Self-pay | Admitting: Pulmonary Disease

## 2021-12-09 ENCOUNTER — Encounter: Payer: Self-pay | Admitting: Pulmonary Disease

## 2021-12-09 ENCOUNTER — Ambulatory Visit (INDEPENDENT_AMBULATORY_CARE_PROVIDER_SITE_OTHER): Payer: Medicare Other | Admitting: Pulmonary Disease

## 2021-12-09 VITALS — BP 114/62 | HR 52 | Temp 98.2°F | Ht 70.0 in | Wt 161.0 lb

## 2021-12-09 DIAGNOSIS — R0609 Other forms of dyspnea: Secondary | ICD-10-CM

## 2021-12-09 DIAGNOSIS — J449 Chronic obstructive pulmonary disease, unspecified: Secondary | ICD-10-CM | POA: Diagnosis not present

## 2021-12-09 NOTE — Progress Notes (Signed)
? ?'@Patient'$  ID: Larry Briggs, male    DOB: March 05, 1936, 86 y.o.   MRN: [id number] ? ?Chief Complaint  ?Patient presents with  ? Follow-up  ?  Pt states he is do good since last visit. He is currently on Trelegy daily and Albuterol as needed. He states that he feels like the inhaler is helping him.   ? ? ?Referring provider: ?[name], MD ? ?HPI:  ? ?Larry Briggs is a 86 y.o. man with past medical history of tobacco abuse now in remission and severe (FEV1 30%) COPD here for follow-up. ? ?Overall he is doing well.  No exacerbations since last visit.  None in the last 12 months.  He had 2 in the preceding 12 months.  Reports good adherence to Trelegy as well as azithromycin Monday Wednesday Friday.  Both of likely contributed to decreased frequency of exacerbations.  Breathing stable.  He thinks doing okay.  He has recent falls.  Recent mechanical in nature.  Most recently he was not using his cane.  He was encouraged to continue to ambulate with assistance of cane. ? ?Questionaires / Pulmonary Flowsheets:  ? ?ACT:  ?   ? View : No data to display.  ?  ?  ?  ? ? ?MMRC: ?   ? View : No data to display.  ?  ?  ?  ? ? ?Epworth:  ?   ? View : No data to display.  ?  ?  ?  ? ? ?Tests:  ? ?FENO:  ?Lab Results  ?Component Value Date  ? NITRICOXIDE 30 06/01/2018  ? ? ?PFT: ? ?  Latest Ref Rng & Units 04/26/2016  ?  8:35 AM  ?PFT Results  ?FVC-Pre L 2.20    ?FVC-Predicted Pre % 53    ?FVC-Post L 2.41    ?FVC-Predicted Post % 58    ?Pre FEV1/FVC % % 40    ?Post FEV1/FCV % % 37    ?FEV1-Pre L 0.89    ?FEV1-Predicted Pre % 30    ?FEV1-Post L 0.90    ?DLCO uncorrected ml/min/mmHg 13.54    ?DLCO UNC% % 41    ?DLCO corrected ml/min/mmHg 14.48    ?DLCO COR %Predicted % 44    ?DLVA Predicted % 53    ?TLC L 8.20    ?TLC % Predicted % 116    ?RV % Predicted % 191    ? ?Severe COPD with severely reduced DLCO on my interpretation.  ? ?WALK:  ?   ? View : No data to display.  ?  ?  ?  ? ? ?Imaging: ?No results found. ? ?Lab  Results: ?Personally reviewed, no history of eosinophilia ?CBC ?   ?Component Value Date/Time  ? WBC 6.7 07/10/2020 1423  ? WBC 7.0 09/09/2017 0625  ? RBC 4.25 07/10/2020 1423  ? RBC 3.01 (L) 09/09/2017 0277  ? HGB 14.6 07/10/2020 1423  ? HCT 41.3 07/10/2020 1423  ? PLT 205 07/10/2020 1423  ? MCV 97 07/10/2020 1423  ? MCH 34.4 (H) 07/10/2020 1423  ? MCH 31.2 09/09/2017 0625  ? MCHC 35.4 07/10/2020 1423  ? MCHC 34.7 09/09/2017 0625  ? RDW 13.8 07/10/2020 1423  ? LYMPHSABS 1.2 07/10/2020 1423  ? MONOABS 0.5 09/04/2017 1047  ? EOSABS 0.2 07/10/2020 1423  ? BASOSABS 0.0 07/10/2020 1423  ? ? ?BMET ?   ?Component Value Date/Time  ? NA 139 07/10/2020 1423  ? K 4.1 07/10/2020 1423  ? CL 98 07/10/2020  1423  ? CO2 29 07/10/2020 1423  ? GLUCOSE 118 (H) 07/10/2020 1423  ? GLUCOSE 135 (H) 09/09/2017 7124  ? BUN 14 07/10/2020 1423  ? CREATININE 1.04 07/10/2020 1423  ? CREATININE 0.86 03/09/2016 0926  ? CALCIUM 8.9 07/10/2020 1423  ? GFRNONAA 67 07/10/2020 1423  ? GFRAA 77 07/10/2020 1423  ? ? ?BNP ?No results found for: BNP ? ?ProBNP ?No results found for: PROBNP ? ?Specialty Problems   ? ?  ? Pulmonary Problems  ? COPD GOLD 4   ?  Quit smoking 1995 ?- Spirometry  04/26/16   FEV1 0.90 (30%)  Ratio 0.37  No resp to saba p ? Prior to study ?- 07/30/2019  After extensive coaching inhaler device,  effectiveness =    75% (short Ti)   ?  ?  ? Cough  ? ? ?Allergies  ?Allergen Reactions  ? Iodinated Contrast Media Nausea And Vomiting and Rash  ? ? ?Immunization History  ?Administered Date(s) Administered  ? Fluad Quad(high Dose 65+) 05/18/2019, 05/22/2021  ? Influenza Inj Mdck Quad Pf 04/20/2018  ? Influenza Split 04/16/2016  ? Influenza, High Dose Seasonal PF 05/17/2014, 05/16/2015, 05/02/2020  ? Influenza, Quadrivalent, Recombinant, Inj, Pf 05/16/2017, 05/01/2018, 05/11/2019, 05/02/2020  ? Influenza-Unspecified 05/26/2012, 04/20/2013  ? PFIZER(Purple Top)SARS-COV-2 Vaccination 08/23/2019, 09/13/2019, 05/07/2020, 02/09/2021  ?  Pneumococcal Conjugate-13 06/14/2014  ? Zoster Recombinat (Shingrix) 05/24/2018, 08/17/2018  ? Zoster, Live 06/07/2006  ? ? ?Past Medical History:  ?Diagnosis Date  ? CAD (coronary artery disease)   ? Remote MI in 1980. CABG 1995. Stent to left main in 2004. Last cath in April of 2012. EF 50%; patent LIMA to DX/LAD with collateralization of the distal right, patent SVG to OM, occluded SVG to distal RCA, and patent stent to LAD  ? History of heart attack 1980  ? Hypercholesteremia   ? Hypertension   ? Increased glucose level   ? Obesity   ? ? ?Tobacco History: ?Social History  ? ?Tobacco Use  ?Smoking Status Former  ? Packs/day: 0.75  ? Years: 40.00  ? Pack years: 30.00  ? Types: Cigarettes  ? Start date: 80  ? Quit date: 07/02/1994  ? Years since quitting: 27.4  ?Smokeless Tobacco Never  ? ?Counseling given: Not Answered ? ? ?Continue to not smoke ? ?Outpatient Encounter Medications as of 12/09/2021  ?Medication Sig  ? acetaminophen (TYLENOL) 650 MG CR tablet Take 650 mg by mouth every 8 (eight) hours as needed.  ? amLODipine (NORVASC) 5 MG tablet TAKE 1 TABLET EVERY DAY  ? azithromycin (ZITHROMAX) 250 MG tablet TAKE 1 TABLET 3  TIMES A WEEK ON MONDAY, WEDNESDAY, AND FRIDAY.  ? Bioflavonoid Products (ESTER-C) 500-550 MG TABS Take 500 mg by mouth daily.  ? chlorthalidone (HYGROTON) 25 MG tablet TAKE 1 TABLET AT BEDTIME (PLEASE CALL [phone] TO MAKE ANNUAL APPOINTMENT WITH DR Martinique FOR FURTHER REFILLS)  ? docusate sodium (COLACE) 100 MG capsule Take 100 mg by mouth as needed.  ? doxylamine, Sleep, (UNISOM) 25 MG tablet Take 25 mg by mouth at bedtime as needed.  ? Fluticasone-Umeclidin-Vilant (TRELEGY ELLIPTA) 100-62.5-25 MCG/INH AEPB INHALE 1 PUFF INTO THE LUNGS DAILY.  ? guaiFENesin (MUCINEX) 600 MG 12 hr tablet Take by mouth 2 (two) times daily as needed.  ? metoprolol succinate (TOPROL XL) 25 MG 24 hr tablet Take 1 tablet (25 mg total) by mouth daily.  ? nitroGLYCERIN (NITROSTAT) 0.4 MG SL tablet Place 1  tablet (0.4 mg total) under the tongue as needed.  ?  Omega-3 Fatty Acids (FISH OIL) 500 MG CAPS Take by mouth.  ? Polyethyl Glycol-Propyl Glycol (SYSTANE ULTRA OP) Apply 1 drop to eye 2 (two) times daily.  ? Respiratory Therapy Supplies (FLUTTER) DEVI Use as directed  ? simvastatin (ZOCOR) 20 MG tablet TAKE 1 TABLET AT BEDTIME (PLEASE CALL [phone] TO MAKE ANNUAL APPOINTMENT WITH DR Martinique FOR FURTHER REFILLS)  ? tamsulosin (FLOMAX) 0.4 MG CAPS capsule Take 0.4 mg by mouth daily after supper.  ? VENTOLIN HFA 108 (90 Base) MCG/ACT inhaler INHALE 2 PUFFS BY MOUTH EVERY 4 HOURS AS NEEDED FOR WHEEZING FOR SHORTNESS OF BREATH  ? XARELTO 20 MG TABS tablet TAKE 1 TABLET EVERY DAY WITH SUPPER  ? ?No facility-administered encounter medications on file as of 12/09/2021.  ? ? ? ? ?Physical Exam ? ?BP 114/62 (BP Location: Left Arm, Patient Position: Sitting, Cuff Size: Normal)   Pulse (!) 52   Temp 98.2 ?F (36.8 ?C) (Oral)   Ht '5\' 10"'$  (1.778 m)   Wt 161 lb (73 kg)   SpO2 98%   BMI 23.10 kg/m?  ? ?Wt Readings from Last 5 Encounters:  ?12/09/21 161 lb (73 kg)  ?08/28/21 160 lb (72.6 kg)  ?06/02/21 162 lb 3.2 oz (73.6 kg)  ?01/19/21 167 lb 12.8 oz (76.1 kg)  ?11/27/20 168 lb 12.8 oz (76.6 kg)  ? ? ?BMI Readings from Last 5 Encounters:  ?12/09/21 23.10 kg/m?  ?08/28/21 22.96 kg/m?  ?06/02/21 23.27 kg/m?  ?01/19/21 24.08 kg/m?  ?11/27/20 24.22 kg/m?  ? ? ? ?Physical Exam ?General: Sitting up in exam chair, no acute distress ?Eyes: EOMI, no icterus ?Respiratory: Distant sounds bilaterally, no wheeze or crackle ?Cardiovascular: Irregularly irregular, normal rate, no murmur appreciated ?Extremities: No lower extremity edema, warm ? ? ?Assessment & Plan:  ? ?Dyspnea on exertion: Likely largely driven by severe COPD.  Stable.  Encourage increased exertional capacity as tolerated. ? ?Severe FEV1 30% COPD, Gold category D: Prior frequent exacerbated.  Azithromycin low-dose Monday Wednesday Friday added 11/2020.  No further  exacerbations in interim.  To continue this.  To continue Breztri. ? ?Solitary pulmonary nodule: Right lower lobe stable since 07/21/18 with subsequent interval stability 02/20/19 and 08/2020.  No further follow up needed. ? ? ?Return in abou

## 2021-12-09 NOTE — Patient Instructions (Signed)
Nice to see you again ? ?No changes to medications ? ?Return to clinic in 6 months or sooner as needed with Dr. Silas Flood ?

## 2022-02-08 ENCOUNTER — Encounter: Payer: Self-pay | Admitting: *Deleted

## 2022-02-08 ENCOUNTER — Other Ambulatory Visit: Payer: Self-pay | Admitting: *Deleted

## 2022-02-08 NOTE — Patient Outreach (Signed)
[facility]) Care Management  02/08/2022  Larry Briggs Dec 06, 1935 [id number]   RN Health Coach closing case. COPD is stable. Patient is using inhalers. No recent COPD exacerbations.  Plan:  Case closure Closure letter sent to PCP Closure letter sent to Patient Certificate of completion sent to patient  [facility] Management [phone]

## 2022-02-09 NOTE — Telephone Encounter (Signed)
This encounter was created in error - please disregard.

## 2022-02-22 DIAGNOSIS — I10 Essential (primary) hypertension: Secondary | ICD-10-CM | POA: Diagnosis not present

## 2022-02-22 DIAGNOSIS — E785 Hyperlipidemia, unspecified: Secondary | ICD-10-CM | POA: Diagnosis not present

## 2022-02-22 DIAGNOSIS — R7309 Other abnormal glucose: Secondary | ICD-10-CM | POA: Diagnosis not present

## 2022-02-24 NOTE — Progress Notes (Unsigned)
Larry Briggs Date of Birth: May 29, 1936 Medical Record #[id number]  History of Present Illness: Larry Briggs is seen back today for follow up CAD. He has a known history of coronary disease and status post coronary bypass surgery in 1995. He underwent stenting of the left main coronary in 2004. His last cardiac catheterization in April 2012 showed that everything was patent except for a vein graft to the distal right coronary. The PDA was supplied by left to right collaterals. He has been followed in our HTN clinic. He was having side effects on medications. This improved with stopping lisinopril and doxazosin and starting on valsartan. He does have severe COPD. His valsartan was later switched to irbesartan and then this was later discontinued.   He was  admitted in February 2019 with left hip fracture after a mechanical fall.  He underwent intramedullary nail in the left intertrochanteric hip.  EKG showed with new T wave inversion in anterolateral leads in the setting of tachycardia and anemia.  Cardiology was consulted for abnormal EKG.  Patient however denied any anginal symptom.  Echocardiogram obtained on 09/07/2017 showed EF 50-55%, grade 1 DD, trivial pericardial effusion.  Overall poor image.  Serial troponins were negative.  Suspicion for angina was low.  Overnight, he went into atrial fibrillation with RVR which was new.  He was not a anticoagulation candidate given significant bleeding risk after surgery.  The plan was to address anticoagulation therapy on follow-up. He subsequently converted to NSR. 30 day event monitor was recommended to see if he had any further Afib. This did demonstrate paroxysmal Afib and he was started on Xarelto. ASA was discontinued.   Last spring he called with complaints of sustained bradycardia with HR in the 40s. Metoprolol was reduced. Event monitor showed PVCs but no significant bradycardia and no Afib.   On follow up today he is doing well. No chest pain or  palpitations. Does note a little more SOB in the last 3 weeks. No wheezing or cough.  No edema. Hasn't used albuterol very often.   Current Outpatient Medications on File Prior to Visit  Medication Sig Dispense Refill   acetaminophen (TYLENOL) 650 MG CR tablet Take 650 mg by mouth every 8 (eight) hours as needed.     amLODipine (NORVASC) 5 MG tablet TAKE 1 TABLET EVERY DAY 90 tablet 1   azithromycin (ZITHROMAX) 250 MG tablet TAKE 1 TABLET 3  TIMES A WEEK ON MONDAY, WEDNESDAY, AND FRIDAY. 39 tablet 3   Bioflavonoid Products (ESTER-C) 500-550 MG TABS Take 500 mg by mouth daily.     chlorthalidone (HYGROTON) 25 MG tablet TAKE 1 TABLET AT BEDTIME (PLEASE CALL [phone] TO MAKE ANNUAL APPOINTMENT WITH DR Martinique FOR FURTHER REFILLS) 90 tablet 3   docusate sodium (COLACE) 100 MG capsule Take 100 mg by mouth as needed.     doxylamine, Sleep, (UNISOM) 25 MG tablet Take 25 mg by mouth at bedtime as needed.     Fluticasone-Umeclidin-Vilant (TRELEGY ELLIPTA) 100-62.5-25 MCG/INH AEPB INHALE 1 PUFF INTO THE LUNGS DAILY. 180 each 3   guaiFENesin (MUCINEX) 600 MG 12 hr tablet Take by mouth 2 (two) times daily as needed.     metoprolol succinate (TOPROL XL) 25 MG 24 hr tablet Take 1 tablet (25 mg total) by mouth daily. 90 tablet 3   nitroGLYCERIN (NITROSTAT) 0.4 MG SL tablet Place 1 tablet (0.4 mg total) under the tongue as needed. 30 tablet 3   Omega-3 Fatty Acids (FISH OIL) 500 MG CAPS Take  by mouth.     Polyethyl Glycol-Propyl Glycol (SYSTANE ULTRA OP) Apply 1 drop to eye 2 (two) times daily.     Respiratory Therapy Supplies (FLUTTER) DEVI Use as directed 1 each 0   simvastatin (ZOCOR) 20 MG tablet TAKE 1 TABLET AT BEDTIME (PLEASE CALL [phone] TO MAKE ANNUAL APPOINTMENT WITH DR Martinique FOR FURTHER REFILLS) 90 tablet 3   tamsulosin (FLOMAX) 0.4 MG CAPS capsule Take 0.4 mg by mouth daily after supper.     VENTOLIN HFA 108 (90 Base) MCG/ACT inhaler INHALE 2 PUFFS BY MOUTH EVERY 4 HOURS AS NEEDED FOR  WHEEZING FOR SHORTNESS OF BREATH 18 g 4   XARELTO 20 MG TABS tablet TAKE 1 TABLET EVERY DAY WITH SUPPER 90 tablet 1   No current facility-administered medications on file prior to visit.    Allergies  Allergen Reactions   Iodinated Contrast Media Nausea And Vomiting and Rash    Past Medical History:  Diagnosis Date   CAD (coronary artery disease)    Remote MI in 1980. CABG 1995. Stent to left main in 2004. Last cath in April of 2012. EF 50%; patent LIMA to DX/LAD with collateralization of the distal right, patent SVG to OM, occluded SVG to distal RCA, and patent stent to LAD   History of heart attack 1980   Hypercholesteremia    Hypertension    Increased glucose level    Obesity     Past Surgical History:  Procedure Laterality Date   [place]  April 2012   Mild reduction if EF at 50%. Patent LIMA to DX/LAD with collateralization to distal RCA, patent SVG to OM and occluded SVG to distal right and patent stent to LAD/Left main;   CORONARY ARTERY BYPASS GRAFT  05/1994   x5 LIMA to LAD & DX, SVG to LCX, SVG to RCA   CORONARY STENT PLACEMENT  2004   Stent to the L main   INTRAMEDULLARY (IM) NAIL INTERTROCHANTERIC Left 09/05/2017   Procedure: INTRAMEDULLARY (IM) NAIL INTERTROCHANTRIC;  Surgeon: Renette Butters, MD;  Location: [place];  Service: Orthopedics;  Laterality: Left;    Social History   Tobacco Use  Smoking Status Former   Packs/day: 0.75   Years: 40.00   Total pack years: 30.00   Types: Cigarettes   Start date: 49   Quit date: 07/02/1994   Years since quitting: 27.6  Smokeless Tobacco Never    Social History   Substance and Sexual Activity  Alcohol Use No    Family History  Problem Relation Age of Onset   Heart attack Father    Hypertension Father    Stroke Mother     Review of Systems: The review of systems is per the HPI.  All other systems were reviewed and are negative.  Physical Exam: There were  no vitals taken for this visit. GENERAL:  Well appearing elderly WM in NAD HEENT:  PERRL, EOMI, sclera are clear. Oropharynx is clear. NECK:  No jugular venous distention, carotid upstroke brisk and symmetric, no bruits, no thyromegaly or adenopathy LUNGS:  Clear. Decreased BS- no rales or wheezes.  CHEST:  Unremarkable HEART:  RRR with extrasystoles,  PMI not displaced or sustained,S1 and S2 within normal limits, no S3, no S4: no clicks, no rubs, no murmurs ABD:  Soft, nontender. BS +, no masses or bruits. No hepatomegaly, no splenomegaly EXT:  2 + pulses throughout, no edema, no cyanosis no clubbing SKIN:  Warm and dry.  No rashes NEURO:  Alert and oriented x 3. Cranial nerves II through XII intact. PSYCH:  Cognitively intact      LABORATORY DATA:   Lab Results  Component Value Date   WBC 6.7 07/10/2020   HGB 14.6 07/10/2020   HCT 41.3 07/10/2020   PLT 205 07/10/2020   GLUCOSE 118 (H) 07/10/2020   CHOL 145 07/10/2020   TRIG 71 07/10/2020   HDL 64 07/10/2020   LDLCALC 67 07/10/2020   ALT 10 07/10/2020   AST 11 07/10/2020   NA 139 07/10/2020   K 4.1 07/10/2020   CL 98 07/10/2020   CREATININE 1.04 07/10/2020   BUN 14 07/10/2020   CO2 29 07/10/2020   TSH 1.470 07/10/2020   INR 1.05 09/04/2017   Labs reviewed from 07/22/16: cholesterol 174, triglycerides 140, LDL 82, HDL 64. CMET normal Except creatinine reported as 242 (this has to be an error).  Dated 01/25/17: A1c 5.9%.  Dated 07/27/17: cholesterol 146, triglycerides 104, HDL 56, LDL 69.  Dated 08/03/18: cholesterol 151, triglycerides 102, HDL 58, LDL 73. CMET normal Dated 08/08/19: Triglycerides 131, HDL 64. Dated 01/26/21: cholesterol 139, triglycerides 73, HDL 68, Non HDL 71.   Ecg not done today   Echo 09/07/17: Study Conclusions   - Left ventricle: The cavity size was normal. Wall thickness was   increased in a pattern of mild LVH. Systolic function was normal.   The estimated ejection fraction was in the range of  50% to 55%.   Doppler parameters are consistent with abnormal left ventricular   relaxation (grade 1 diastolic dysfunction). - Aortic valve: Mildly calcified annulus. - Right atrium: The atrium was mildly dilated. - Pericardium, extracardiac: A trivial pericardial effusion was   identified.   Impressions:   - Technically difficult echo   Image quality is poor.  Event monitor 09/26/17: Study Highlights   Normal sinus rhythm Atrial fibrillation- paroxysmal with controlled ventricular response Isolated PVCs     Event monitor 11/18/20: Study Highlights    Normal sinus rhythm. average HR 83. predominantly first degree AV block with infrequent second degree AV block Mobitz type 1 Frequent PVCs with bigeminy and NSVT- longest episode 16 beats. Brief runs of PAT. longest 6 beats. No Afib seen No sustained bradycardia or pauses.    Assessment / Plan: 1. CAD s/p CABG 1995. S/p stenting of the left main coronary artery 2004.  Cath 2012 showed occlusion of SVG to diagonal. He remains asymptomatic. Continue current anti-anginal Rx.   2. Paroxysmal Afib. some degree ov tachy-brady syndrome. Follow up event monitor showed PVCs and some NSVT but no significant bradycardia or Afib.   Now on Xarelto for anticoagulation. Tolerating it well. Continue current therapy.   3.  HTN- well controlled now on current regimen.  4. Hyperlipidemia.  Continue statin and fish oil. At goal. Plan follow up lab with Dr Shelia Media.   5. Severe COPD with recurrent bronchitis. - followed by pulmonary. Improved on Trelegy. Use albuterol PRN    Follow up in 6 months.

## 2022-02-25 DIAGNOSIS — I259 Chronic ischemic heart disease, unspecified: Secondary | ICD-10-CM | POA: Diagnosis not present

## 2022-02-25 DIAGNOSIS — D692 Other nonthrombocytopenic purpura: Secondary | ICD-10-CM | POA: Diagnosis not present

## 2022-02-25 DIAGNOSIS — Z0000 Encounter for general adult medical examination without abnormal findings: Secondary | ICD-10-CM | POA: Diagnosis not present

## 2022-02-25 DIAGNOSIS — I480 Paroxysmal atrial fibrillation: Secondary | ICD-10-CM | POA: Diagnosis not present

## 2022-02-25 DIAGNOSIS — D6869 Other thrombophilia: Secondary | ICD-10-CM | POA: Diagnosis not present

## 2022-02-25 DIAGNOSIS — J449 Chronic obstructive pulmonary disease, unspecified: Secondary | ICD-10-CM | POA: Diagnosis not present

## 2022-02-25 DIAGNOSIS — I10 Essential (primary) hypertension: Secondary | ICD-10-CM | POA: Diagnosis not present

## 2022-02-25 DIAGNOSIS — E1121 Type 2 diabetes mellitus with diabetic nephropathy: Secondary | ICD-10-CM | POA: Diagnosis not present

## 2022-02-25 DIAGNOSIS — N1831 Chronic kidney disease, stage 3a: Secondary | ICD-10-CM | POA: Diagnosis not present

## 2022-02-25 DIAGNOSIS — I739 Peripheral vascular disease, unspecified: Secondary | ICD-10-CM | POA: Diagnosis not present

## 2022-02-25 DIAGNOSIS — N401 Enlarged prostate with lower urinary tract symptoms: Secondary | ICD-10-CM | POA: Diagnosis not present

## 2022-02-26 ENCOUNTER — Other Ambulatory Visit: Payer: Self-pay | Admitting: Internal Medicine

## 2022-03-01 ENCOUNTER — Ambulatory Visit (INDEPENDENT_AMBULATORY_CARE_PROVIDER_SITE_OTHER): Payer: Medicare Other | Admitting: Cardiology

## 2022-03-01 ENCOUNTER — Telehealth: Payer: Self-pay | Admitting: Pulmonary Disease

## 2022-03-01 ENCOUNTER — Encounter: Payer: Self-pay | Admitting: Cardiology

## 2022-03-01 VITALS — BP 115/60 | HR 62 | Ht 70.0 in | Wt 163.0 lb

## 2022-03-01 DIAGNOSIS — I25810 Atherosclerosis of coronary artery bypass graft(s) without angina pectoris: Secondary | ICD-10-CM | POA: Diagnosis not present

## 2022-03-01 DIAGNOSIS — I480 Paroxysmal atrial fibrillation: Secondary | ICD-10-CM | POA: Diagnosis not present

## 2022-03-01 DIAGNOSIS — I10 Essential (primary) hypertension: Secondary | ICD-10-CM | POA: Diagnosis not present

## 2022-03-01 MED ORDER — TRELEGY ELLIPTA 100-62.5-25 MCG/ACT IN AEPB: 1.0000 | INHALATION_SPRAY | Freq: Every day | RESPIRATORY_TRACT | 3 refills | Status: DC

## 2022-03-01 NOTE — Telephone Encounter (Signed)
Called patients wife and verified medication needing refilled. Verified pharmacy. Nothing further needed

## 2022-05-05 ENCOUNTER — Other Ambulatory Visit: Payer: Self-pay | Admitting: Cardiology

## 2022-05-05 DIAGNOSIS — I4891 Unspecified atrial fibrillation: Secondary | ICD-10-CM

## 2022-05-05 NOTE — Telephone Encounter (Signed)
Called PCP and requested labs again. Provided fax [phone]

## 2022-05-05 NOTE — Telephone Encounter (Signed)
Prescription refill request for Xarelto received.  Indication: Afib  Last office visit: 03/01/22 ([place]) Weight: 73.9kg Age: 86 Scr: 1.04 (07/10/20) CrCl:    Labs overdue. Will follow-up with PCP to determine if labs have been drawn within the past year.

## 2022-05-06 NOTE — Telephone Encounter (Addendum)
Called PCP in reference to updated labs since haven't received; left a message.   Received faxed labs from PCP.  Xarelto '20mg'$  refill request received. Pt is 86 years old, weight-73.9kg, Crea-1.24 on 02/22/2022 via scanned labs from PCP, last seen by Dr. Martinique  on 03/01/2022, Diagnosis-Afib, Tat Momoli.18 mL/min;   Will consult with PharmD regarding dose/crcl.Discussed with Gerald Stabs, PharmD and advised to reduce Xarelto dose to '15mg'$  qd; will call pt.  '@345pm'$ -called pt and there was no answer, therefore left a message for the pt to call back.   Pt returned call and was advised of the dose change. Pt states he has a month supply left of the xarelto '20mg'$  and will use those before he starts the new dose of xarelto '15mg'$  that will be sent vis mail order. Xarelto '15mg'$  refill sent.

## 2022-05-14 DIAGNOSIS — Z23 Encounter for immunization: Secondary | ICD-10-CM | POA: Diagnosis not present

## 2022-05-31 DIAGNOSIS — Z23 Encounter for immunization: Secondary | ICD-10-CM | POA: Diagnosis not present

## 2022-06-14 ENCOUNTER — Ambulatory Visit (INDEPENDENT_AMBULATORY_CARE_PROVIDER_SITE_OTHER): Payer: Medicare Other | Admitting: Pulmonary Disease

## 2022-06-14 ENCOUNTER — Encounter: Payer: Self-pay | Admitting: Pulmonary Disease

## 2022-06-14 VITALS — BP 126/64 | HR 68 | Wt 161.4 lb

## 2022-06-14 DIAGNOSIS — J449 Chronic obstructive pulmonary disease, unspecified: Secondary | ICD-10-CM | POA: Diagnosis not present

## 2022-06-14 MED ORDER — SILVER SULFADIAZINE 1 % EX CREA: 1.0000 | TOPICAL_CREAM | Freq: Every day | CUTANEOUS | 2 refills | Status: DC | PRN

## 2022-06-14 NOTE — Progress Notes (Unsigned)
$'@Patient'o$  ID: Larry Briggs, male    DOB: September 17, 1935, 86 y.o.   MRN: [id number]  Chief Complaint  Patient presents with   Follow-up    Follow up for COPD. Pt states that the trelegy is working well for his copd.     Referring provider: [name], MD  HPI:   Larry Briggs is a 86 y.o. man with past medical history of tobacco abuse now in remission and severe (FEV1 30%) COPD here for follow-up.  Overall he is doing well.  No exacerbations since last visit.  None in the last 6 months.   Reports good adherence to Trelegy as well as azithromycin Monday Wednesday Friday.  Both of likely contributed to decreased frequency of exacerbations.  Breathing stable.  He thinks doing okay. Wife is present and agrees.     Questionaires / Pulmonary Flowsheets:   ACT:      No data to display          MMRC:     No data to display          Epworth:      No data to display          Tests:   FENO:  Lab Results  Component Value Date   NITRICOXIDE 30 06/01/2018    PFT:    Latest Ref Rng & Units 04/26/2016    8:35 AM  PFT Results  FVC-Pre L 2.20   FVC-Predicted Pre % 53   FVC-Post L 2.41   FVC-Predicted Post % 58   Pre FEV1/FVC % % 40   Post FEV1/FCV % % 37   FEV1-Pre L 0.89   FEV1-Predicted Pre % 30   FEV1-Post L 0.90   DLCO uncorrected ml/min/mmHg 13.54   DLCO UNC% % 41   DLCO corrected ml/min/mmHg 14.48   DLCO COR %Predicted % 44   DLVA Predicted % 53   TLC L 8.20   TLC % Predicted % 116   RV % Predicted % 191    Severe COPD with severely reduced DLCO on my interpretation.   WALK:      No data to display          Imaging: No results found.  Lab Results: Personally reviewed, no history of eosinophilia CBC    Component Value Date/Time   WBC 6.7 07/10/2020 1423   WBC 7.0 09/09/2017 0625   RBC 4.25 07/10/2020 1423   RBC 3.01 (L) 09/09/2017 0625   HGB 14.6 07/10/2020 1423   HCT 41.3 07/10/2020 1423   PLT 205 07/10/2020 1423   MCV 97  07/10/2020 1423   MCH 34.4 (H) 07/10/2020 1423   MCH 31.2 09/09/2017 0625   MCHC 35.4 07/10/2020 1423   MCHC 34.7 09/09/2017 0625   RDW 13.8 07/10/2020 1423   LYMPHSABS 1.2 07/10/2020 1423   MONOABS 0.5 09/04/2017 1047   EOSABS 0.2 07/10/2020 1423   BASOSABS 0.0 07/10/2020 1423    BMET    Component Value Date/Time   NA 139 07/10/2020 1423   K 4.1 07/10/2020 1423   CL 98 07/10/2020 1423   CO2 29 07/10/2020 1423   GLUCOSE 118 (H) 07/10/2020 1423   GLUCOSE 135 (H) 09/09/2017 0625   BUN 14 07/10/2020 1423   CREATININE 1.04 07/10/2020 1423   CREATININE 0.86 03/09/2016 0926   CALCIUM 8.9 07/10/2020 1423   GFRNONAA 67 07/10/2020 1423   GFRAA 77 07/10/2020 1423    BNP No results found for: "BNP"  ProBNP No results found  for: "PROBNP"  Specialty Problems       Pulmonary Problems   COPD GOLD 4     Quit smoking 1995 - Spirometry  04/26/16   FEV1 0.90 (30%)  Ratio 0.37  No resp to saba p ? Prior to study - 07/30/2019  After extensive coaching inhaler device,  effectiveness =    75% (short Ti)        Cough    Allergies  Allergen Reactions   Viagra [Sildenafil]     Other reaction(s): blue haze   Iodinated Contrast Media Nausea And Vomiting and Rash    Immunization History  Administered Date(s) Administered   Fluad Quad(high Dose 65+) 05/18/2019, 05/22/2021   Influenza Inj Mdck Quad Pf 04/20/2018   Influenza Split 04/16/2016   Influenza, High Dose Seasonal PF 05/17/2014, 05/16/2015, 05/02/2020, 05/14/2022   Influenza, Quadrivalent, Recombinant, Inj, Pf 05/16/2017, 05/01/2018, 05/11/2019, 05/02/2020   Influenza-Unspecified 05/26/2012, 04/20/2013   Moderna Sars-Cov-2 Peds vaccine 25yr thru 131yr10/30/2023   PFIZER(Purple Top)SARS-COV-2 Vaccination 08/23/2019, 09/13/2019, 05/07/2020, 02/09/2021   Pneumococcal Conjugate-13 06/14/2014   Zoster Recombinat (Shingrix) 05/24/2018, 08/17/2018   Zoster, Live 06/07/2006    Past Medical History:  Diagnosis Date   CAD  (coronary artery disease)    Remote MI in 1980. CABG 1995. Stent to left main in 2004. Last cath in April of 2012. EF 50%; patent LIMA to DX/LAD with collateralization of the distal right, patent SVG to OM, occluded SVG to distal RCA, and patent stent to LAD   History of heart attack 1980   Hypercholesteremia    Hypertension    Increased glucose level    Obesity     Tobacco History: Social History   Tobacco Use  Smoking Status Former   Packs/day: 0.75   Years: 40.00   Total pack years: 30.00   Types: Cigarettes   Start date: 1931 Quit date: 07/02/1994   Years since quitting: 27.9  Smokeless Tobacco Never   Counseling given: Not Answered   Continue to not smoke  Outpatient Encounter Medications as of 06/14/2022  Medication Sig   acetaminophen (TYLENOL) 650 MG CR tablet Take 650 mg by mouth every 8 (eight) hours as needed.   amLODipine (NORVASC) 5 MG tablet TAKE 1 TABLET EVERY DAY   azithromycin (ZITHROMAX) 250 MG tablet TAKE 1 TABLET 3  TIMES A WEEK ON MONDAY, WEDNESDAY, AND FRIDAY.   Bioflavonoid Products (ESTER-C) 500-550 MG TABS Take 500 mg by mouth daily.   chlorthalidone (HYGROTON) 25 MG tablet TAKE 1 TABLET AT BEDTIME (PLEASE CALL [phone] TO MAKE ANNUAL APPOINTMENT WITH DR JOMartiniqueOR FURTHER REFILLS)   docusate sodium (COLACE) 100 MG capsule Take 100 mg by mouth as needed.   doxylamine, Sleep, (UNISOM) 25 MG tablet Take 25 mg by mouth at bedtime as needed.   Fluticasone-Umeclidin-Vilant (TRELEGY ELLIPTA) 100-62.5-25 MCG/ACT AEPB Inhale 1 puff into the lungs daily.   guaiFENesin (MUCINEX) 600 MG 12 hr tablet Take by mouth 2 (two) times daily as needed.   metoprolol succinate (TOPROL XL) 25 MG 24 hr tablet Take 1 tablet (25 mg total) by mouth daily.   nitroGLYCERIN (NITROSTAT) 0.4 MG SL tablet Place 1 tablet (0.4 mg total) under the tongue as needed.   Omega-3 Fatty Acids (FISH OIL) 500 MG CAPS Take by mouth.   Polyethyl Glycol-Propyl Glycol (SYSTANE ULTRA OP) Apply  1 drop to eye 2 (two) times daily.   Respiratory Therapy Supplies (FLUTTER) DEVI Use as directed   Rivaroxaban (XARELTO) 15 MG TABS tablet  Take 1 tablet (15 mg total) by mouth daily with supper.   silver sulfADIAZINE (SILVADENE) 1 % cream Apply 1 Application topically daily as needed.   simvastatin (ZOCOR) 20 MG tablet TAKE 1 TABLET AT BEDTIME (PLEASE CALL [phone] TO MAKE ANNUAL APPOINTMENT WITH DR Martinique FOR FURTHER REFILLS)   tamsulosin (FLOMAX) 0.4 MG CAPS capsule Take 0.4 mg by mouth daily after supper.   VENTOLIN HFA 108 (90 Base) MCG/ACT inhaler INHALE 2 PUFFS BY MOUTH EVERY 4 HOURS AS NEEDED FOR WHEEZING FOR SHORTNESS OF BREATH   No facility-administered encounter medications on file as of 06/14/2022.      Physical Exam  BP 126/64 (BP Location: Left Arm, Patient Position: Sitting, Cuff Size: Normal)   Pulse 68   Wt 161 lb 6.4 oz (73.2 kg)   SpO2 98%   BMI 23.16 kg/m   Wt Readings from Last 5 Encounters:  06/14/22 161 lb 6.4 oz (73.2 kg)  03/01/22 163 lb (73.9 kg)  12/09/21 161 lb (73 kg)  08/28/21 160 lb (72.6 kg)  06/02/21 162 lb 3.2 oz (73.6 kg)    BMI Readings from Last 5 Encounters:  06/14/22 23.16 kg/m  03/01/22 23.39 kg/m  12/09/21 23.10 kg/m  08/28/21 22.96 kg/m  06/02/21 23.27 kg/m     Physical Exam General: Sitting up in exam chair, no acute distress Eyes: EOMI, no icterus Respiratory: Distant sounds bilaterally, no wheeze or crackle Cardiovascular: Irregularly irregular, normal rate, no murmur appreciated Extremities: No lower extremity edema, warm   Assessment & Plan:   Dyspnea on exertion: Likely largely driven by severe COPD.  Stable.  Encourage increased exertional capacity as tolerated.  Severe FEV1 30% COPD, Gold category D: Prior frequent exacerbated.  Azithromycin low-dose Monday Wednesday Friday added 11/2020.  No further exacerbations in interim.  To continue this.  To continue Trelegy.  Solitary pulmonary nodule: Right lower  lobe stable since 07/21/18 with subsequent interval stability 02/20/19 and 08/2020.  No further follow up needed.  Skin tears: Silvadene cream refilled to be used PRN   Return in about 6 months (around 12/13/2022).   Lanier Clam, MD 06/15/2022

## 2022-06-14 NOTE — Patient Instructions (Signed)
Nice to see you again  No changes to medications  I sent a new prescription for the silvadene cream  Return to clinic in 6 months or sooner as needed

## 2022-08-03 ENCOUNTER — Other Ambulatory Visit: Payer: Self-pay | Admitting: Cardiology

## 2022-08-11 ENCOUNTER — Other Ambulatory Visit: Payer: Self-pay | Admitting: Cardiology

## 2022-08-17 NOTE — Progress Notes (Signed)
Larry Briggs Date of Birth: 1935/09/13 Medical Record #[id number]  History of Present Illness: Larry Briggs is seen back today for follow up CAD. He has a known history of coronary disease and status post coronary bypass surgery in 1995. He underwent stenting of the left main coronary in 2004. His last cardiac catheterization in April 2012 showed that everything was patent except for a vein graft to the distal right coronary. The PDA was supplied by left to right collaterals. He has been followed in our HTN clinic. He was having side effects on medications. This improved with stopping lisinopril and doxazosin and starting on valsartan. He does have severe COPD. His valsartan was later switched to irbesartan and then this was later discontinued.   He was  admitted in February 2019 with left hip fracture after a mechanical fall.  He underwent intramedullary nail in the left intertrochanteric hip.  EKG showed with new T wave inversion in anterolateral leads in the setting of tachycardia and anemia.  Cardiology was consulted for abnormal EKG.  Patient however denied any anginal symptom.  Echocardiogram obtained on 09/07/2017 showed EF 50-55%, grade 1 DD, trivial pericardial effusion.  Overall poor image.  Serial troponins were negative.  Suspicion for angina was low.  Overnight, he went into atrial fibrillation with RVR which was new.  He was not a anticoagulation candidate given significant bleeding risk after surgery.  He subsequently converted to NSR. 30 day event monitor was recommended to see if he had any further Afib. This did demonstrate paroxysmal Afib and he was started on Xarelto. ASA was discontinued.   In 2022 he called with complaints of sustained bradycardia with HR in the 40s. Metoprolol was reduced. Event monitor showed PVCs but no significant bradycardia and no Afib.   On follow up today he is doing well. No chest pain or palpitations. Still has SOB but states it hasn't changed.  No wheezing  or cough.  No edema. Hasn't used albuterol very often. Reports lab work done recently by Dr Shelia Media.    Current Outpatient Medications on File Prior to Visit  Medication Sig Dispense Refill   acetaminophen (TYLENOL) 650 MG CR tablet Take 650 mg by mouth every 8 (eight) hours as needed.     amLODipine (NORVASC) 5 MG tablet TAKE 1 TABLET EVERY DAY 90 tablet 3   azithromycin (ZITHROMAX) 250 MG tablet TAKE 1 TABLET 3  TIMES A WEEK ON MONDAY, WEDNESDAY, AND FRIDAY. 39 tablet 3   Bioflavonoid Products (ESTER-C) 500-550 MG TABS Take 500 mg by mouth daily.     chlorthalidone (HYGROTON) 25 MG tablet Take 1 tablet (25 mg total) by mouth daily. 90 tablet 3   docusate sodium (COLACE) 100 MG capsule Take 100 mg by mouth as needed.     doxylamine, Sleep, (UNISOM) 25 MG tablet Take 25 mg by mouth at bedtime as needed.     Fluticasone-Umeclidin-Vilant (TRELEGY ELLIPTA) 100-62.5-25 MCG/ACT AEPB Inhale 1 puff into the lungs daily. 180 each 3   guaiFENesin (MUCINEX) 600 MG 12 hr tablet Take by mouth 2 (two) times daily as needed.     metoprolol succinate (TOPROL XL) 25 MG 24 hr tablet Take 1 tablet (25 mg total) by mouth daily. 90 tablet 3   nitroGLYCERIN (NITROSTAT) 0.4 MG SL tablet Place 1 tablet (0.4 mg total) under the tongue as needed. 30 tablet 3   Omega-3 Fatty Acids (FISH OIL) 500 MG CAPS Take by mouth.     Polyethyl Glycol-Propyl Glycol (SYSTANE ULTRA  OP) Apply 1 drop to eye 2 (two) times daily.     Respiratory Therapy Supplies (FLUTTER) DEVI Use as directed 1 each 0   Rivaroxaban (XARELTO) 15 MG TABS tablet Take 1 tablet (15 mg total) by mouth daily with supper. 90 tablet 1   silver sulfADIAZINE (SILVADENE) 1 % cream Apply 1 Application topically daily as needed. 50 g 2   simvastatin (ZOCOR) 20 MG tablet Take 1 tablet (20 mg total) by mouth daily at 6 PM. 90 tablet 3   tamsulosin (FLOMAX) 0.4 MG CAPS capsule Take 0.4 mg by mouth daily after supper.     VENTOLIN HFA 108 (90 Base) MCG/ACT inhaler INHALE 2  PUFFS BY MOUTH EVERY 4 HOURS AS NEEDED FOR WHEEZING FOR SHORTNESS OF BREATH 18 g 4   No current facility-administered medications on file prior to visit.    Allergies  Allergen Reactions   Viagra [Sildenafil]     Other reaction(s): blue haze   Iodinated Contrast Media Nausea And Vomiting and Rash    Past Medical History:  Diagnosis Date   CAD (coronary artery disease)    Remote MI in 1980. CABG 1995. Stent to left main in 2004. Last cath in April of 2012. EF 50%; patent LIMA to DX/LAD with collateralization of the distal right, patent SVG to OM, occluded SVG to distal RCA, and patent stent to LAD   History of heart attack 1980   Hypercholesteremia    Hypertension    Increased glucose level    Obesity     Past Surgical History:  Procedure Laterality Date   [place]  April 2012   Mild reduction if EF at 50%. Patent LIMA to DX/LAD with collateralization to distal RCA, patent SVG to OM and occluded SVG to distal right and patent stent to LAD/Left main;   CORONARY ARTERY BYPASS GRAFT  05/1994   x5 LIMA to LAD & DX, SVG to LCX, SVG to RCA   CORONARY STENT PLACEMENT  2004   Stent to the L main   INTRAMEDULLARY (IM) NAIL INTERTROCHANTERIC Left 09/05/2017   Procedure: INTRAMEDULLARY (IM) NAIL INTERTROCHANTRIC;  Surgeon: Renette Butters, MD;  Location: [facility];  Service: Orthopedics;  Laterality: Left;    Social History   Tobacco Use  Smoking Status Former   Packs/day: 0.75   Years: 40.00   Total pack years: 30.00   Types: Cigarettes   Start date: 55   Quit date: 07/02/1994   Years since quitting: 28.1  Smokeless Tobacco Never    Social History   Substance and Sexual Activity  Alcohol Use No    Family History  Problem Relation Age of Onset   Heart attack Father    Hypertension Father    Stroke Mother     Review of Systems: The review of systems is per the HPI.  All other systems were reviewed and are  negative.  Physical Exam: There were no vitals taken for this visit. GENERAL:  Well appearing elderly WM in NAD HEENT:  PERRL, EOMI, sclera are clear. Oropharynx is clear. NECK:  No jugular venous distention, carotid upstroke brisk and symmetric, no bruits, no thyromegaly or adenopathy LUNGS:  Clear. Decreased BS- no rales or wheezes.  CHEST:  Unremarkable HEART:  RRR,  PMI not displaced or sustained,S1 and S2 within normal limits, no S3, no S4: no clicks, no rubs, no murmurs ABD:  Soft, nontender. BS +, no masses or bruits. No hepatomegaly, no splenomegaly EXT:  2 + pulses throughout, no edema, no cyanosis no clubbing SKIN:  Warm and dry.  No rashes NEURO:  Alert and oriented x 3. Cranial nerves II through XII intact. PSYCH:  Cognitively intact      LABORATORY DATA:   Lab Results  Component Value Date   WBC 6.7 07/10/2020   HGB 14.6 07/10/2020   HCT 41.3 07/10/2020   PLT 205 07/10/2020   GLUCOSE 118 (H) 07/10/2020   CHOL 145 07/10/2020   TRIG 71 07/10/2020   HDL 64 07/10/2020   LDLCALC 67 07/10/2020   ALT 10 07/10/2020   AST 11 07/10/2020   NA 139 07/10/2020   K 4.1 07/10/2020   CL 98 07/10/2020   CREATININE 1.04 07/10/2020   BUN 14 07/10/2020   CO2 29 07/10/2020   TSH 1.470 07/10/2020   INR 1.05 09/04/2017   Labs reviewed from 07/22/16: cholesterol 174, triglycerides 140, LDL 82, HDL 64. CMET normal Except creatinine reported as 242 (this has to be an error).  Dated 01/25/17: A1c 5.9%.  Dated 07/27/17: cholesterol 146, triglycerides 104, HDL 56, LDL 69.  Dated 08/03/18: cholesterol 151, triglycerides 102, HDL 58, LDL 73. CMET normal Dated 08/08/19: Triglycerides 131, HDL 64. Dated 01/26/21: cholesterol 139, triglycerides 73, HDL 68, Non HDL 71.  Dated 02/22/22: A1c 5.1%. triglycerides 62. Cholesterol 142, HDL 75, LDL 55. Creatinine 1.24. otherwise CBC and CMET normal.  Ecg is  done today. NSR with first degree AV block and occ. Blocked PAC. Rate 62. Nonspecific ST-T  abnormality. I have personally reviewed and interpreted this study.    Echo 09/07/17: Study Conclusions   - Left ventricle: The cavity size was normal. Wall thickness was   increased in a pattern of mild LVH. Systolic function was normal.   The estimated ejection fraction was in the range of 50% to 55%.   Doppler parameters are consistent with abnormal left ventricular   relaxation (grade 1 diastolic dysfunction). - Aortic valve: Mildly calcified annulus. - Right atrium: The atrium was mildly dilated. - Pericardium, extracardiac: A trivial pericardial effusion was   identified.   Impressions:   - Technically difficult echo   Image quality is poor.  Event monitor 09/26/17: Study Highlights   Normal sinus rhythm Atrial fibrillation- paroxysmal with controlled ventricular response Isolated PVCs     Event monitor 11/18/20: Study Highlights    Normal sinus rhythm. average HR 83. predominantly first degree AV block with infrequent second degree AV block Mobitz type 1 Frequent PVCs with bigeminy and NSVT- longest episode 16 beats. Brief runs of PAT. longest 6 beats. No Afib seen No sustained bradycardia or pauses.    Assessment / Plan: 1. CAD s/p CABG 1995. S/p stenting of the left main coronary artery 2004.  Cath 2012 showed occlusion of SVG to diagonal. He remains asymptomatic. Continue current anti-anginal Rx with amlodipine and Toprol. Not on ASA since on Xarelto.   2. Paroxysmal Afib. some degree ov tachy-brady syndrome.  Now on Xarelto for anticoagulation. Tolerating it well. Continue current therapy.   3.  HTN- well controlled now on current regimen.  4. Hyperlipidemia.  Continue statin and fish oil. At goal. LDL 55  5. Severe COPD with recurrent bronchitis. - followed by pulmonary. Improved on Trelegy. On suppressive therapy with azithromycin. Use albuterol PRN    Follow up in 6 months.

## 2022-08-24 DIAGNOSIS — R972 Elevated prostate specific antigen [PSA]: Secondary | ICD-10-CM | POA: Diagnosis not present

## 2022-08-24 DIAGNOSIS — N5201 Erectile dysfunction due to arterial insufficiency: Secondary | ICD-10-CM | POA: Diagnosis not present

## 2022-08-24 DIAGNOSIS — R350 Frequency of micturition: Secondary | ICD-10-CM | POA: Diagnosis not present

## 2022-08-27 ENCOUNTER — Ambulatory Visit: Payer: Medicare Other | Attending: Cardiology | Admitting: Cardiology

## 2022-08-27 ENCOUNTER — Encounter: Payer: Self-pay | Admitting: Cardiology

## 2022-08-27 VITALS — BP 130/70 | HR 72 | Ht 70.0 in | Wt 161.4 lb

## 2022-08-27 DIAGNOSIS — I25810 Atherosclerosis of coronary artery bypass graft(s) without angina pectoris: Secondary | ICD-10-CM | POA: Insufficient documentation

## 2022-08-27 DIAGNOSIS — E7800 Pure hypercholesterolemia, unspecified: Secondary | ICD-10-CM | POA: Diagnosis not present

## 2022-08-27 DIAGNOSIS — I10 Essential (primary) hypertension: Secondary | ICD-10-CM

## 2022-08-27 DIAGNOSIS — I480 Paroxysmal atrial fibrillation: Secondary | ICD-10-CM | POA: Insufficient documentation

## 2022-08-27 MED ORDER — NITROGLYCERIN 0.4 MG SL SUBL: 0.4000 mg | SUBLINGUAL_TABLET | SUBLINGUAL | 3 refills | Status: DC | PRN

## 2022-08-27 NOTE — Patient Instructions (Signed)
Medication Instructions:   Your physician recommends that you continue on your current medications as directed. Please refer to the Current Medication list given to you today.  *If you need a refill on your cardiac medications before your next appointment, please call your pharmacy*   Lab Work: NONE ordered at this time of appointment   If you have labs (blood work) drawn today and your tests are completely normal, you will receive your results only by: [place] (if you have MyChart) OR A paper copy in the mail If you have any lab test that is abnormal or we need to change your treatment, we will call you to review the results.   Testing/Procedures: NONE ordered at this time of appointment   Follow-Up: At [facility], you and your health needs are our priority.  As part of our continuing mission to provide you with exceptional heart care, we have created designated Provider Care Teams.  These Care Teams include your primary Cardiologist (physician) and Advanced Practice Providers (APPs -  Physician Assistants and Nurse Practitioners) who all work together to provide you with the care you need, when you need it.   Your next appointment:   6 month(s)  Provider:   Peter Martinique, MD     Other Instructions

## 2022-09-22 ENCOUNTER — Other Ambulatory Visit: Payer: Self-pay | Admitting: Pulmonary Disease

## 2022-09-22 DIAGNOSIS — J449 Chronic obstructive pulmonary disease, unspecified: Secondary | ICD-10-CM

## 2022-10-11 ENCOUNTER — Other Ambulatory Visit: Payer: Self-pay | Admitting: Cardiology

## 2022-11-05 ENCOUNTER — Telehealth: Payer: Self-pay | Admitting: Pulmonary Disease

## 2022-11-05 NOTE — Telephone Encounter (Signed)
Pt returned call. Stated to him if the phlegm was clear, no abx would be prescribed. Pt stated that he is also sneezing. Asked him if he were taking any allergy meds and  he said that he was not. Told pt that he could take either zyrtec or Claritin OTC to help with his symptoms and he verbalized understanding. Nothing further needed.

## 2022-11-05 NOTE — Telephone Encounter (Signed)
ATC X1 LVM for patient to call the office back 

## 2022-11-05 NOTE — Telephone Encounter (Signed)
Pt wife is calling in bc her husband is coughing clear sputum a little more than normal.  [facility]acy: [facility] [address]

## 2022-11-09 DIAGNOSIS — Z961 Presence of intraocular lens: Secondary | ICD-10-CM | POA: Diagnosis not present

## 2022-12-04 ENCOUNTER — Emergency Department (HOSPITAL_COMMUNITY): Payer: Medicare Other

## 2022-12-04 ENCOUNTER — Other Ambulatory Visit: Payer: Self-pay

## 2022-12-04 ENCOUNTER — Encounter (HOSPITAL_COMMUNITY): Payer: Self-pay | Admitting: *Deleted

## 2022-12-04 ENCOUNTER — Observation Stay (HOSPITAL_COMMUNITY)
Admission: EM | Admit: 2022-12-04 | Discharge: 2022-12-05 | Disposition: A | Discharge status: Home / Self Care | Payer: Medicare Other | Attending: Emergency Medicine | Admitting: Emergency Medicine

## 2022-12-04 DIAGNOSIS — J449 Chronic obstructive pulmonary disease, unspecified: Secondary | ICD-10-CM | POA: Insufficient documentation

## 2022-12-04 DIAGNOSIS — Z951 Presence of aortocoronary bypass graft: Secondary | ICD-10-CM | POA: Diagnosis not present

## 2022-12-04 DIAGNOSIS — I2510 Atherosclerotic heart disease of native coronary artery without angina pectoris: Secondary | ICD-10-CM | POA: Insufficient documentation

## 2022-12-04 DIAGNOSIS — E119 Type 2 diabetes mellitus without complications: Secondary | ICD-10-CM | POA: Diagnosis not present

## 2022-12-04 DIAGNOSIS — E7800 Pure hypercholesterolemia, unspecified: Secondary | ICD-10-CM | POA: Diagnosis present

## 2022-12-04 DIAGNOSIS — Z7901 Long term (current) use of anticoagulants: Secondary | ICD-10-CM | POA: Insufficient documentation

## 2022-12-04 DIAGNOSIS — I259 Chronic ischemic heart disease, unspecified: Secondary | ICD-10-CM | POA: Diagnosis present

## 2022-12-04 DIAGNOSIS — G459 Transient cerebral ischemic attack, unspecified: Secondary | ICD-10-CM | POA: Diagnosis not present

## 2022-12-04 DIAGNOSIS — I63512 Cerebral infarction due to unspecified occlusion or stenosis of left middle cerebral artery: Secondary | ICD-10-CM

## 2022-12-04 DIAGNOSIS — R531 Weakness: Secondary | ICD-10-CM | POA: Diagnosis present

## 2022-12-04 DIAGNOSIS — I639 Cerebral infarction, unspecified: Secondary | ICD-10-CM | POA: Diagnosis not present

## 2022-12-04 DIAGNOSIS — E876 Hypokalemia: Secondary | ICD-10-CM | POA: Diagnosis not present

## 2022-12-04 DIAGNOSIS — Z79899 Other long term (current) drug therapy: Secondary | ICD-10-CM | POA: Diagnosis not present

## 2022-12-04 DIAGNOSIS — I10 Essential (primary) hypertension: Secondary | ICD-10-CM | POA: Insufficient documentation

## 2022-12-04 DIAGNOSIS — N179 Acute kidney failure, unspecified: Secondary | ICD-10-CM | POA: Diagnosis not present

## 2022-12-04 DIAGNOSIS — Z87891 Personal history of nicotine dependence: Secondary | ICD-10-CM | POA: Diagnosis not present

## 2022-12-04 LAB — BASIC METABOLIC PANEL
Anion gap: 13 (ref 5–15)
BUN: 19 mg/dL (ref 8–23)
CO2: 25 mmol/L (ref 22–32)
Calcium: 8.9 mg/dL (ref 8.9–10.3)
Chloride: 96 mmol/L — ABNORMAL LOW (ref 98–111)
Creatinine, Ser: 1.41 mg/dL — ABNORMAL HIGH (ref 0.61–1.24)
GFR, Estimated: 49 mL/min — ABNORMAL LOW (ref >60)
Glucose, Bld: 226 mg/dL — ABNORMAL HIGH (ref 70–99)
Potassium: 3.2 mmol/L — ABNORMAL LOW (ref 3.5–5.1)
Sodium: 134 mmol/L — ABNORMAL LOW (ref 135–145)

## 2022-12-04 LAB — CBC
HCT: 40.2 % (ref 39.0–52.0)
Hemoglobin: 14.6 g/dL (ref 13.0–17.0)
MCH: 36.9 pg — ABNORMAL HIGH (ref 26.0–34.0)
MCHC: 36.3 g/dL — ABNORMAL HIGH (ref 30.0–36.0)
MCV: 101.5 fL — ABNORMAL HIGH (ref 80.0–100.0)
Platelets: 164 10*3/uL (ref 150–400)
RBC: 3.96 MIL/uL — ABNORMAL LOW (ref 4.22–5.81)
RDW: 12.4 % (ref 11.5–15.5)
WBC: 7.3 10*3/uL (ref 4.0–10.5)
nRBC: 0 % (ref 0.0–0.2)

## 2022-12-04 LAB — TROPONIN I (HIGH SENSITIVITY): Troponin I (High Sensitivity): 6 ng/L (ref <18)

## 2022-12-04 LAB — PROTIME-INR
INR: 1.5 — ABNORMAL HIGH (ref 0.8–1.2)
Prothrombin Time: 17.6 seconds — ABNORMAL HIGH (ref 11.4–15.2)

## 2022-12-04 LAB — APTT: aPTT: 41 seconds — ABNORMAL HIGH (ref 24–36)

## 2022-12-04 NOTE — ED Triage Notes (Signed)
The pt did not sleep well all night  he woke up 0700am and then got out of bed 0900 but he reports that he has not felt very good  and sometime this afternoon he was not able to use his lt hand grip and he reports that it felt numb  no problem now  but his wife reports that he has more difficulty walking than usual  he walks with a cane always  but he is saying that he just does not feel well

## 2022-12-04 NOTE — ED Provider Notes (Signed)
[place] EMERGENCY DEPARTMENT AT [facility] Provider Note   CSN: [id number] Arrival date & time: 12/04/22  2031     History {Add pertinent medical, surgical, social history, OB history to HPI:1} Chief Complaint  Patient presents with   Weakness    Larry Briggs is a 87 y.o. male.  87 year old male with past medical history significant for hypertension, CAD, A-fib on Xarelto presents today for evaluation of weakness on the left side that started upon waking up this morning around 9 AM.  He states he went to bed last night feeling normal.  He states that initially started with decreased grip strength on the left, and then he noticed he was unable to walk normally throughout the day.  He feels that the grip strength has resolved.  Denies any difficulty with speech, facial droop, vision issues, or balance issues.  He states he feels weaker on the left particularly in his left leg.  Denies history of CVA.  The history is provided by the patient. No language interpreter was used.       Home Medications Prior to Admission medications   Medication Sig Start Date End Date Taking? Authorizing Provider  acetaminophen (TYLENOL) 650 MG CR tablet Take 650 mg by mouth every 8 (eight) hours as needed.    [provider]  amLODipine (NORVASC) 5 MG tablet TAKE 1 TABLET EVERY DAY 08/12/22   [place], Peter M, MD  azithromycin (ZITHROMAX) 250 MG tablet TAKE 1 TABLET 3  TIMES A WEEK ON MONDAY, WEDNESDAY, AND FRIDAY. 12/02/21   Hunsucker, Lesia Sago, MD  Bioflavonoid Products (ESTER-C) 500-550 MG TABS Take 500 mg by mouth daily.    [provider]  chlorthalidone (HYGROTON) 25 MG tablet Take 1 tablet (25 mg total) by mouth daily. 08/03/22   [place], Peter M, MD  docusate sodium (COLACE) 100 MG capsule Take 100 mg by mouth as needed.    [provider]  doxylamine, Sleep, (UNISOM) 25 MG tablet Take 25 mg by mouth at bedtime as needed.    [provider]   Fluticasone-Umeclidin-Vilant (TRELEGY ELLIPTA) 100-62.5-25 MCG/ACT AEPB Inhale 1 puff into the lungs daily. 03/01/22   Hunsucker, Lesia Sago, MD  guaiFENesin (MUCINEX) 600 MG 12 hr tablet Take by mouth 2 (two) times daily as needed.    [provider]  metoprolol succinate (TOPROL-XL) 25 MG 24 hr tablet TAKE 1 TABLET EVERY DAY 10/12/22   [place], Peter M, MD  nitroGLYCERIN (NITROSTAT) 0.4 MG SL tablet Place 1 tablet (0.4 mg total) under the tongue as needed. 08/27/22   [place], Peter M, MD  Omega-3 Fatty Acids (FISH OIL) 500 MG CAPS Take by mouth.    [provider]  Polyethyl Glycol-Propyl Glycol (SYSTANE ULTRA OP) Apply 1 drop to eye 2 (two) times daily.    [provider]  Respiratory Therapy Supplies (FLUTTER) DEVI Use as directed 07/30/19   Nyoka Cowden, MD  Rivaroxaban (XARELTO) 15 MG TABS tablet Take 1 tablet (15 mg total) by mouth daily with supper. 05/06/22   [place], Peter M, MD  silver sulfADIAZINE (SILVADENE) 1 % cream Apply 1 Application topically daily as needed. 06/14/22   Hunsucker, Lesia Sago, MD  simvastatin (ZOCOR) 20 MG tablet Take 1 tablet (20 mg total) by mouth daily at 6 PM. 08/03/22   [place], Peter M, MD  tamsulosin (FLOMAX) 0.4 MG CAPS capsule Take 0.4 mg by mouth daily after supper.    [provider]  VENTOLIN HFA 108 (90 Base)  MCG/ACT inhaler INHALE 2 PUFFS BY MOUTH EVERY 4 HOURS AS NEEDED FOR WHEEZING FOR SHORTNESS OF BREATH 09/22/22   Hunsucker, Lesia Sago, MD      Allergies    Viagra [sildenafil] and Iodinated contrast media    Review of Systems   Review of Systems  Constitutional:  Negative for chills and fever.  Eyes:  Negative for visual disturbance.  Respiratory:  Negative for shortness of breath.   Cardiovascular:  Negative for chest pain.  Neurological:  Positive for weakness. Negative for syncope, facial asymmetry, speech difficulty, light-headedness, numbness and headaches.  All other systems reviewed and are  negative.   Physical Exam Updated Vital Signs BP (!) 165/96   Pulse 95   Temp 97.9 F (36.6 C)   Resp 16   Ht 5\' 10"  (1.778 m)   Wt 73.2 kg   SpO2 98%   BMI 23.16 kg/m  Physical Exam Vitals and nursing note reviewed.  Constitutional:      General: He is not in acute distress.    Appearance: Normal appearance. He is not ill-appearing.  HENT:     Head: Normocephalic and atraumatic.     Nose: Nose normal.  Eyes:     General: No scleral icterus.    Extraocular Movements: Extraocular movements intact.     Conjunctiva/sclera: Conjunctivae normal.  Cardiovascular:     Rate and Rhythm: Normal rate and regular rhythm.     Pulses: Normal pulses.  Pulmonary:     Effort: Pulmonary effort is normal. No respiratory distress.     Breath sounds: Normal breath sounds. No wheezing or rales.  Abdominal:     General: There is no distension.     Tenderness: There is no abdominal tenderness.  Musculoskeletal:        General: Normal range of motion.     Cervical back: Normal range of motion.  Skin:    General: Skin is warm and dry.  Neurological:     General: No focal deficit present.     Mental Status: He is alert and oriented to person, place, and time. Mental status is at baseline.     Comments: Cranial nerves III through XII intact.  Full range of motion about the area.  Slight weakness on the left side on the left upper and left lower extremity.  On the left side strength is likely 4/5 compared to 5/5 on the contralateral side.  Sensation intact and symmetrical bilaterally.  Without pronator drift.     ED Results / Procedures / Treatments   Labs (all labs ordered are listed, but only abnormal results are displayed) Labs Reviewed  BASIC METABOLIC PANEL - Abnormal; Notable for the following components:      Result Value   Sodium 134 (*)    Potassium 3.2 (*)    Chloride 96 (*)    Glucose, Bld 226 (*)    Creatinine, Ser 1.41 (*)    GFR, Estimated 49 (*)    All other components  within normal limits  CBC - Abnormal; Notable for the following components:   RBC 3.96 (*)    MCV 101.5 (*)    MCH 36.9 (*)    MCHC 36.3 (*)    All other components within normal limits  PROTIME-INR  APTT  ETHANOL  RAPID URINE DRUG SCREEN, HOSP PERFORMED  URINALYSIS, ROUTINE W REFLEX MICROSCOPIC  TROPONIN I (HIGH SENSITIVITY)  TROPONIN I (HIGH SENSITIVITY)    EKG None  Radiology DG Chest 2 View  Result Date:  12/04/2022 CLINICAL DATA:  Weakness EXAM: CHEST - 2 VIEW COMPARISON:  [id number] FINDINGS: The lungs are stably hyperinflated in keeping with changes of underlying COPD. Nodular opacity at the right lung base may represent a confluence of vascular and osseous shadows, however, an underlying pulmonary nodule is difficult to exclude. The lungs are otherwise clear. No pneumothorax or pleural effusion. Coronary artery bypass grafting has been performed. Cardiac size is within normal limits. Pulmonary vascularity is normal. No acute bone abnormality. IMPRESSION: 1. No radiographic evidence of acute cardiopulmonary disease. 2. COPD. 3. Possible right basilar pulmonary nodule. This could be further assessed with nonemergent CT imaging. Electronically Signed   By: Helyn Numbers M.D.   On: 12/04/2022 22:19    Procedures Procedures  {Document cardiac monitor, telemetry assessment procedure when appropriate:1}  Medications Ordered in ED Medications - No data to display  ED Course/ Medical Decision Making/ A&P   {   Click here for ABCD2, HEART and other calculatorsREFRESH Note before signing :1}                          Medical Decision Making Amount and/or Complexity of Data Reviewed Labs: ordered. Radiology: ordered.   Medical Decision Making / ED Course   This patient presents to the ED for concern of left-sided weakness, this involves an extensive number of treatment options, and is a complaint that carries with it a high risk of complications and morbidity.  The differential  diagnosis includes CVA, TIA  MDM: 87 year old male with past medical history of hypertension, CAD, A-fib on Xarelto presents with above-mentioned complaints.  There is still some residual weakness on the left side which he states is not typical for him.  Will broaden his workup and add CT head, MRI brain without contrast.  Will discuss with neurologist.   Additional history obtained: -Additional history obtained from *** -External records from outside source obtained and reviewed including: Chart review including previous notes, labs, imaging, consultation notes   Lab Tests: -I ordered, reviewed, and interpreted labs.   The pertinent results include:   Labs Reviewed  BASIC METABOLIC PANEL - Abnormal; Notable for the following components:      Result Value   Sodium 134 (*)    Potassium 3.2 (*)    Chloride 96 (*)    Glucose, Bld 226 (*)    Creatinine, Ser 1.41 (*)    GFR, Estimated 49 (*)    All other components within normal limits  CBC - Abnormal; Notable for the following components:   RBC 3.96 (*)    MCV 101.5 (*)    MCH 36.9 (*)    MCHC 36.3 (*)    All other components within normal limits  PROTIME-INR  APTT  ETHANOL  RAPID URINE DRUG SCREEN, HOSP PERFORMED  URINALYSIS, ROUTINE W REFLEX MICROSCOPIC  TROPONIN I (HIGH SENSITIVITY)  TROPONIN I (HIGH SENSITIVITY)      EKG  EKG Interpretation  Date/Time:    Ventricular Rate:    PR Interval:    QRS Duration:   QT Interval:    QTC Calculation:   R Axis:     Text Interpretation:           Imaging Studies ordered: I ordered imaging studies including *** I independently visualized and interpreted imaging. I agree with the radiologist interpretation   Medicines ordered and prescription drug management: No orders of the defined types were placed in this encounter.   -I have reviewed the  patients home medicines and have made adjustments as needed  Critical interventions ***  Consultations Obtained: I  requested consultation with the ***,  and discussed lab and imaging findings as well as pertinent plan - they recommend: ***   Cardiac Monitoring: The patient was maintained on a cardiac monitor.  I personally viewed and interpreted the cardiac monitored which showed an underlying rhythm of: ***  Social Determinants of Health:  Factors impacting patients care include: ***   Reevaluation: After the interventions noted above, I reevaluated the patient and found that they have :{resolved/improved/worsened:23923::"improved"}  Co morbidities that complicate the patient evaluation  Past Medical History:  Diagnosis Date   CAD (coronary artery disease)    Remote MI in 1980. CABG 1995. Stent to left main in 2004. Last cath in April of 2012. EF 50%; patent LIMA to DX/LAD with collateralization of the distal right, patent SVG to OM, occluded SVG to distal RCA, and patent stent to LAD   History of heart attack 1980   Hypercholesteremia    Hypertension    Increased glucose level    Obesity       Dispostion: ***     Final Clinical Impression(s) / ED Diagnoses Final diagnoses:  None     @PCDICTATION @    Final Clinical Impression(s) / ED Diagnoses Final diagnoses:  None    Rx / DC Orders ED Discharge Orders     None

## 2022-12-04 NOTE — Consult Note (Incomplete)
Neurology Consultation Reason for Consult: *** Requesting Physician: ***  CC: ***  History is obtained from:***  HPI: Larry Briggs is a 87 y.o. male ***   LKW: *** Thrombolytic given?: No, or if yes, time given ***  Checklist of contraindications was reviewed and negative. Risks, benefits and alternatives were discussed *** IA performed?: No, or if yes, groin puncture time: *** Premorbid modified rankin scale: ***     0 - No symptoms.     1 - No significant disability. Able to carry out all usual activities, despite some symptoms.     2 - Slight disability. Able to look after own affairs without assistance, but unable to carry out all previous activities.     3 - Moderate disability. Requires some help, but able to walk unassisted.     4 - Moderately severe disability. Unable to attend to own bodily needs without assistance, and unable to walk unassisted.     5 - Severe disability. Requires constant nursing care and attention, bedridden, incontinent.     6 - Dead. ICH Score: ***  Time performed: *** GCS: {Blank single:19197::"3-4 is 2 points","5-12 is 1 point","13-15 is 0 points"} Infratentorial: {yes no:314532}. If yes, 1 point Volume: {Blank single:19197::">30cc is 1 point","<30cc is 0 points"}  Age: 87 y.o.. >80 is 1 point Intraventricular extension is 1 point  Score:***  A Score of {Blank single:19197::"0 points has a 30 day mortality of 0%","1 points has a 30 day mortality of 13%","2 points has a 30 day mortality of 26%","3 points has a 30 day mortality of 72%","4 points has a 30 day mortality of 97%","5-6 points has a 30 day mortality of 100%"}. Stroke. 2001 Apr;32(4):891-7.    ROS: All other review of systems was negative except as noted in the HPI. *** Unable to obtain due to altered mental status.   Past Medical History:  Diagnosis Date  . CAD (coronary artery disease)    Remote MI in 1980. CABG 1995. Stent to left main in 2004. Last cath in April of 2012. EF  50%; patent LIMA to DX/LAD with collateralization of the distal right, patent SVG to OM, occluded SVG to distal RCA, and patent stent to LAD  . History of heart attack 1980  . Hypercholesteremia   . Hypertension   . Increased glucose level   . Obesity    ***  Family History  Problem Relation Age of Onset  . Heart attack Father   . Hypertension Father   . Stroke Mother    ***  Social History:  reports that he quit smoking about 28 years ago. His smoking use included cigarettes. He started smoking about 69 years ago. He has a 30.00 pack-year smoking history. He has never used smokeless tobacco. He reports that he does not drink alcohol and does not use drugs. ***  Exam: Current vital signs: BP (!) 165/96   Pulse 95   Temp 97.9 F (36.6 C)   Resp 16   Ht 5\' 10"  (1.778 m)   Wt 73.2 kg   SpO2 98%   BMI 23.16 kg/m  Vital signs in last 24 hours: Temp:  [97.9 F (36.6 C)] 97.9 F (36.6 C) (05/04 2039) Pulse Rate:  [32-110] 95 (05/04 2245) Resp:  [16-24] 16 (05/04 2245) BP: (148-166)/(75-97) 165/96 (05/04 2245) SpO2:  [98 %-100 %] 98 % (05/04 2245) Weight:  [73.2 kg] 73.2 kg (05/04 2107)   Physical Exam  Constitutional: Appears well-developed and well-nourished.  Psych: Affect appropriate to  situation, *** Eyes: No scleral injection HENT: No oropharyngeal obstruction.  MSK: no joint deformities.  Cardiovascular: Normal rate and regular rhythm. *** Perfusing extremities well Respiratory: Effort normal, non-labored breathing GI: Soft.  No distension. There is no tenderness.  Skin: Warm dry and intact visible skin  Neuro: Mental Status: Patient is awake, alert, oriented to person, place, month, year, and situation.*** Patient is able to give a clear and coherent history.*** No signs of aphasia or neglect*** Cranial Nerves: II: Visual Fields are full. Pupils are equal, round, and reactive to light.  *** III,IV, VI: EOMI without ptosis or diploplia.  V: Facial  sensation is symmetric to temperature VII: Facial movement is symmetric.  VIII: hearing is intact to voice X: Uvula elevates symmetrically XI: Shoulder shrug is symmetric. XII: tongue is midline without atrophy or fasciculations.  Motor: Tone is normal. Bulk is normal. 5/5 strength was present in all four extremities. *** Sensory: Sensation is symmetric to light touch and temperature in the arms and legs.*** Deep Tendon Reflexes: 2+ and symmetric in the brachioradialis and patellae. *** Plantars: Toes are downgoing bilaterally. *** Cerebellar: FNF and HKS are intact bilaterally*** Gait:  Deferred in acute setting ***  NIHSS total *** Score breakdown: *** Performed at *** time of patient arrival to ED    I have reviewed labs in epic and the results pertinent to this consultation are:  Basic Metabolic Panel: Recent Labs  Lab 12/04/22 08-Jan-2124  NA 134*  K 3.2*  CL 96*  CO2 25  GLUCOSE 226*  BUN 19  CREATININE 1.41*  CALCIUM 8.9   Baseline 1 - 1.1   CBC: Recent Labs  Lab 12/04/22 January 08, 2124  WBC 7.3  HGB 14.6  HCT 40.2  MCV 101.5*  PLT 164    Coagulation Studies: Recent Labs    12/04/22 01/08/15  LABPROT 17.6*  INR 1.5*    No results found for: "HGBA1C"   Lab Results  Component Value Date   CHOL 145 07/10/2020   HDL 64 07/10/2020   LDLCALC 67 07/10/2020   TRIG 71 07/10/2020   CHOLHDL 2.3 07/10/2020     I have reviewed the images obtained:***   Impression: ***  Recommendations: # *** stroke - Stroke labs  HgbA1c, fasting lipid panel - Carotid Duplex - Frequent neuro checks - Echocardiogram would not change management from a stroke perspective, repeat if needed for cardiac symptoms only *** - Hold anticoagulation overnight, if he remains stable may be able to resume as early as tomorrow but defer to stroke team  - Telemetry monitoring; - Blood pressure goal   - Permissive hypertension to 220/120 due to acute stroke for 24 - 48 hours  - PT consult, OT  consult, Speech consult, unless patient is back to baseline - Stroke team to follow  Brooke Dare MD-PhD [facility] [phone] Available 7 PM to 7 AM, outside of these hours please call Neurologist on call as listed on Amion.

## 2022-12-05 ENCOUNTER — Inpatient Hospital Stay (HOSPITAL_COMMUNITY): Payer: Medicare Other

## 2022-12-05 ENCOUNTER — Other Ambulatory Visit (HOSPITAL_COMMUNITY): Payer: Medicare Other

## 2022-12-05 ENCOUNTER — Encounter (HOSPITAL_COMMUNITY): Payer: Medicare Other

## 2022-12-05 DIAGNOSIS — I639 Cerebral infarction, unspecified: Secondary | ICD-10-CM

## 2022-12-05 DIAGNOSIS — I259 Chronic ischemic heart disease, unspecified: Secondary | ICD-10-CM | POA: Diagnosis not present

## 2022-12-05 DIAGNOSIS — I6300 Cerebral infarction due to thrombosis of unspecified precerebral artery: Secondary | ICD-10-CM | POA: Diagnosis not present

## 2022-12-05 DIAGNOSIS — E876 Hypokalemia: Secondary | ICD-10-CM | POA: Diagnosis not present

## 2022-12-05 DIAGNOSIS — E7800 Pure hypercholesterolemia, unspecified: Secondary | ICD-10-CM | POA: Diagnosis not present

## 2022-12-05 LAB — BASIC METABOLIC PANEL
Anion gap: 12 (ref 5–15)
BUN: 18 mg/dL (ref 8–23)
CO2: 25 mmol/L (ref 22–32)
Calcium: 8.7 mg/dL — ABNORMAL LOW (ref 8.9–10.3)
Chloride: 98 mmol/L (ref 98–111)
Creatinine, Ser: 1.19 mg/dL (ref 0.61–1.24)
GFR, Estimated: >60 mL/min (ref >60)
Glucose, Bld: 129 mg/dL — ABNORMAL HIGH (ref 70–99)
Potassium: 3 mmol/L — ABNORMAL LOW (ref 3.5–5.1)
Sodium: 135 mmol/L (ref 135–145)

## 2022-12-05 LAB — CBC WITH DIFFERENTIAL/PLATELET
Abs Immature Granulocytes: 0.03 10*3/uL (ref 0.00–0.07)
Basophils Absolute: 0 10*3/uL (ref 0.0–0.1)
Basophils Relative: 0 %
Eosinophils Absolute: 0.1 10*3/uL (ref 0.0–0.5)
Eosinophils Relative: 1 %
HCT: 36.9 % — ABNORMAL LOW (ref 39.0–52.0)
Hemoglobin: 13.4 g/dL (ref 13.0–17.0)
Immature Granulocytes: 0 %
Lymphocytes Relative: 13 %
Lymphs Abs: 1 10*3/uL (ref 0.7–4.0)
MCH: 36.7 pg — ABNORMAL HIGH (ref 26.0–34.0)
MCHC: 36.3 g/dL — ABNORMAL HIGH (ref 30.0–36.0)
MCV: 101.1 fL — ABNORMAL HIGH (ref 80.0–100.0)
Monocytes Absolute: 0.5 10*3/uL (ref 0.1–1.0)
Monocytes Relative: 7 %
Neutro Abs: 5.8 10*3/uL (ref 1.7–7.7)
Neutrophils Relative %: 79 %
Platelets: 189 10*3/uL (ref 150–400)
RBC: 3.65 MIL/uL — ABNORMAL LOW (ref 4.22–5.81)
RDW: 12.4 % (ref 11.5–15.5)
WBC: 7.4 10*3/uL (ref 4.0–10.5)
nRBC: 0 % (ref 0.0–0.2)

## 2022-12-05 LAB — URINALYSIS, ROUTINE W REFLEX MICROSCOPIC
Bacteria, UA: NONE SEEN
Bilirubin Urine: NEGATIVE
Glucose, UA: NEGATIVE mg/dL
Ketones, ur: NEGATIVE mg/dL
Leukocytes,Ua: NEGATIVE
Nitrite: NEGATIVE
Protein, ur: NEGATIVE mg/dL
Specific Gravity, Urine: 1.021 (ref 1.005–1.030)
pH: 5 (ref 5.0–8.0)

## 2022-12-05 LAB — VITAMIN B12: Vitamin B-12: 73 pg/mL — ABNORMAL LOW (ref 180–914)

## 2022-12-05 LAB — HEMOGLOBIN A1C
Hgb A1c MFr Bld: 4.7 % — ABNORMAL LOW (ref 4.8–5.6)
Mean Plasma Glucose: 88.19 mg/dL

## 2022-12-05 LAB — FOLATE: Folate: 21.3 ng/mL (ref >5.9)

## 2022-12-05 LAB — LIPID PANEL
Cholesterol: 163 mg/dL (ref 0–200)
HDL: 69 mg/dL (ref >40)
LDL Cholesterol: 80 mg/dL (ref 0–99)
Total CHOL/HDL Ratio: 2.4 RATIO
Triglycerides: 71 mg/dL (ref <150)
VLDL: 14 mg/dL (ref 0–40)

## 2022-12-05 LAB — ETHANOL: Alcohol, Ethyl (B): <10 mg/dL (ref <10)

## 2022-12-05 LAB — CBG MONITORING, ED
Glucose-Capillary: 130 mg/dL — ABNORMAL HIGH (ref 70–99)
Glucose-Capillary: 178 mg/dL — ABNORMAL HIGH (ref 70–99)

## 2022-12-05 LAB — RAPID URINE DRUG SCREEN, HOSP PERFORMED
Amphetamines: NOT DETECTED
Barbiturates: NOT DETECTED
Benzodiazepines: NOT DETECTED
Cocaine: NOT DETECTED
Opiates: NOT DETECTED
Tetrahydrocannabinol: NOT DETECTED

## 2022-12-05 LAB — TROPONIN I (HIGH SENSITIVITY): Troponin I (High Sensitivity): 11 ng/L (ref <18)

## 2022-12-05 MED ORDER — CYANOCOBALAMIN 1000 MCG PO TABS: 1000.0000 ug | ORAL_TABLET | Freq: Every day | ORAL | 0 refills | Status: AC

## 2022-12-05 MED ORDER — TAMSULOSIN HCL 0.4 MG PO CAPS: 0.4000 mg | ORAL_CAPSULE | Freq: Every day | ORAL | Status: DC

## 2022-12-05 MED ORDER — STROKE: EARLY STAGES OF RECOVERY BOOK: Freq: Once | Status: DC

## 2022-12-05 MED ORDER — AZITHROMYCIN 250 MG PO TABS: 250.0000 mg | ORAL_TABLET | ORAL | Status: DC

## 2022-12-05 MED ORDER — POTASSIUM CHLORIDE IN NACL 20-0.9 MEQ/L-% IV SOLN
INTRAVENOUS | Status: DC
  Filled 2022-12-05: qty 1000

## 2022-12-05 MED ORDER — UMECLIDINIUM BROMIDE 62.5 MCG/ACT IN AEPB
1.0000 | INHALATION_SPRAY | Freq: Every day | RESPIRATORY_TRACT | Status: DC
  Administered 2022-12-05: 1 via RESPIRATORY_TRACT
  Filled 2022-12-05: qty 7

## 2022-12-05 MED ORDER — METOPROLOL SUCCINATE ER 25 MG PO TB24
25.0000 mg | ORAL_TABLET | Freq: Every day | ORAL | Status: DC
  Administered 2022-12-05: 25 mg via ORAL
  Filled 2022-12-05: qty 1

## 2022-12-05 MED ORDER — POTASSIUM CHLORIDE CRYS ER 20 MEQ PO TBCR
40.0000 meq | EXTENDED_RELEASE_TABLET | ORAL | Status: AC
  Administered 2022-12-05: 40 meq via ORAL
  Filled 2022-12-05 (×2): qty 2

## 2022-12-05 MED ORDER — FLUTICASONE FUROATE-VILANTEROL 100-25 MCG/ACT IN AEPB
1.0000 | INHALATION_SPRAY | Freq: Every day | RESPIRATORY_TRACT | Status: DC
  Administered 2022-12-05: 1 via RESPIRATORY_TRACT
  Filled 2022-12-05: qty 28

## 2022-12-05 MED ORDER — ASPIRIN 325 MG PO TABS
325.0000 mg | ORAL_TABLET | Freq: Every day | ORAL | Status: DC
  Administered 2022-12-05: 325 mg via ORAL
  Filled 2022-12-05: qty 1

## 2022-12-05 MED ORDER — ATORVASTATIN CALCIUM 40 MG PO TABS
80.0000 mg | ORAL_TABLET | Freq: Every day | ORAL | Status: DC
  Administered 2022-12-05: 80 mg via ORAL
  Filled 2022-12-05: qty 2

## 2022-12-05 MED ORDER — INSULIN ASPART 100 UNIT/ML IJ SOLN
0.0000 [IU] | Freq: Three times a day (TID) | INTRAMUSCULAR | Status: DC
  Administered 2022-12-05: 3 [IU] via SUBCUTANEOUS

## 2022-12-05 MED ORDER — VITAMIN B-12 1000 MCG PO TABS
1000.0000 ug | ORAL_TABLET | Freq: Every day | ORAL | Status: DC
  Administered 2022-12-05: 1000 ug via ORAL
  Filled 2022-12-05: qty 1

## 2022-12-05 MED ORDER — HYDRALAZINE HCL 20 MG/ML IJ SOLN: 5.0000 mg | INTRAMUSCULAR | Status: DC | PRN

## 2022-12-05 MED ORDER — SIMVASTATIN 40 MG PO TABS: 40.0000 mg | ORAL_TABLET | Freq: Every day | ORAL | 0 refills | Status: DC

## 2022-12-05 MED ORDER — CYANOCOBALAMIN 1000 MCG/ML IJ SOLN: 1000.0000 ug | Freq: Once | INTRAMUSCULAR | Status: DC

## 2022-12-05 NOTE — Evaluation (Addendum)
Physical Therapy Evaluation Patient Details Name: Larry Briggs MRN: [id number] DOB: 11-30-1935 Today's Date: 12/05/2022  History of Present Illness  his is a 87 year old gentleman admitted 5/4 after noting issues with his left hand, he had difficulty picking up a bottle.  He found he had to use both hands to hold it.  Then he noted his left leg felt weaker than the right. ER MRI is positive for small posterior right frontal lobe infarct. PMH: CAD, HLD, hypertension, atrial fibrillation.  At baseline he uses a cane.  Clinical Impression  Pt admitted with above diagnosis. Pt able to ambulate with cane in controlled environment with supervision. Wife states that pt is close to baseline.  Did note that pt with left UE weakness and decr coordination and pt with poor endurance therefore recommend Outpt PT and Outpt OT f/u. Spoke with OT regarding pts deficits and that he will be fine to f/u with Outpt OT to address and is close to baseline per wife.  Issued gait belt and wife reports that she feels able to care for pt at home.  Will follow acutely.  Pt currently with functional limitations due to the deficits listed below (see PT Problem List). Pt will benefit from acute skilled PT to increase their independence and safety with mobility to allow discharge.          Recommendations for follow up therapy are one component of a multi-disciplinary discharge planning process, led by the attending physician.  Recommendations may be updated based on patient status, additional functional criteria and insurance authorization.  Follow Up Recommendations       Assistance Recommended at Discharge PRN  Patient can return home with the following  A little help with walking and/or transfers;Assistance with cooking/housework;Assist for transportation;Help with stairs or ramp for entrance    Equipment Recommendations Other (comment) (issued gait belt)  Recommendations for Other Services       Functional Status  Assessment Patient has had a recent decline in their functional status and demonstrates the ability to make significant improvements in function in a reasonable and predictable amount of time.     Precautions / Restrictions Precautions Precautions: Fall Restrictions Weight Bearing Restrictions: No      Mobility  Bed Mobility Overal bed mobility: Independent                  Transfers Overall transfer level: Needs assistance Equipment used: Straight cane Transfers: Sit to/from Stand Sit to Stand: Supervision           General transfer comment: Pt can stand to cane with supervision. Takes incr time to stand with use of hands.    Ambulation/Gait Ambulation/Gait assistance: Supervision Gait Distance (Feet): 70 Feet Assistive device: Straight cane Gait Pattern/deviations: Decreased stride length, Step-to pattern, Trunk flexed, Wide base of support   Gait velocity interpretation: 1.31 - 2.62 ft/sec, indicative of limited community ambulator   General Gait Details: Pt steady gait with use of cane.  Pt does flex at trunk somewhat. Wife states that gait is at baseline as far as she can tell. She states he always walks limited distance due to issues with SOB.  Stairs            Wheelchair Mobility    Modified Rankin (Stroke Patients Only)       Balance Overall balance assessment: Needs assistance Sitting-balance support: No upper extremity supported, Feet supported Sitting balance-Leahy Scale: Good Sitting balance - Comments: Pt took socks off and put back on  in sitting. Takes incr time due to breathing   Standing balance support: Single extremity supported, During functional activity Standing balance-Leahy Scale: Fair Standing balance comment: Pt can balance with 1 UE support with cane                             Pertinent Vitals/Pain Pain Assessment Pain Assessment: No/denies pain    Home Living Family/patient expects to be discharged  to:: Private residence Living Arrangements: Spouse/significant other Available Help at Discharge: Family;Available 24 hours/day Type of Home: House Home Access: Stairs to enter Entrance Stairs-Rails: Right Entrance Stairs-Number of Steps: 2   Home Layout: One level Home Equipment: Cane - single point;Shower seat;Hand held shower head;Grab bars - tub/shower;Rolling Environmental consultant (2 wheels)      Prior Function Prior Level of Function : Driving             Mobility Comments: used cane inside some and always outside ADLs Comments: B/D self     Hand Dominance   Dominant Hand: Right    Extremity/Trunk Assessment   Upper Extremity Assessment Upper Extremity Assessment: RUE deficits/detail;LUE deficits/detail RUE Deficits / Details: WFL LUE Deficits / Details: slowed movement noted LUE Coordination: decreased fine motor    Lower Extremity Assessment Lower Extremity Assessment: LLE deficits/detail LLE Deficits / Details: hip 3-/5, knee and ankle 3/5.  Slight weakness noted but still functional.    Cervical / Trunk Assessment Cervical / Trunk Assessment: Normal  Communication   Communication: No difficulties  Cognition Arousal/Alertness: Awake/alert Behavior During Therapy: WFL for tasks assessed/performed Overall Cognitive Status: Within Functional Limits for tasks assessed                                          General Comments General comments (skin integrity, edema, etc.): 112 bpm, 98%RA,    Exercises General Exercises - Lower Extremity Ankle Circles/Pumps: AROM, Both, 10 reps, Supine Long Arc Quad: AROM, Both, 10 reps, Seated   Assessment/Plan    PT Assessment Patient needs continued PT services  PT Problem List Decreased activity tolerance;Decreased balance;Decreased mobility;Decreased knowledge of use of DME;Decreased safety awareness;Decreased knowledge of precautions;Cardiopulmonary status limiting activity       PT Treatment Interventions  DME instruction;Gait training;Stair training;Functional mobility training;Therapeutic activities;Therapeutic exercise;Balance training;Patient/family education    PT Goals (Current goals can be found in the Care Plan section)  Acute Rehab PT Goals Patient Stated Goal: to go home today PT Goal Formulation: With patient Time For Goal Achievement: 12/19/22 Potential to Achieve Goals: Good    Frequency Min 4X/week     Co-evaluation               AM-PAC PT "6 Clicks" Mobility  Outcome Measure Help needed turning from your back to your side while in a flat bed without using bedrails?: None Help needed moving from lying on your back to sitting on the side of a flat bed without using bedrails?: None Help needed moving to and from a bed to a chair (including a wheelchair)?: A Little Help needed standing up from a chair using your arms (e.g., wheelchair or bedside chair)?: A Little Help needed to walk in hospital room?: A Little Help needed climbing 3-5 steps with a railing? : A Lot 6 Click Score: 19    End of Session Equipment Utilized During Treatment: Gait belt Activity Tolerance:  Patient tolerated treatment well Patient left: in chair;with call bell/phone within reach;with family/visitor present Nurse Communication: Mobility status PT Visit Diagnosis: Muscle weakness (generalized) (M62.81)    Time: 4098-1191 PT Time Calculation (min) (ACUTE ONLY): 35 min   Charges:   PT Evaluation $PT Eval Moderate Complexity: 1 Mod PT Treatments $Gait Training: 8-22 mins        Kipling Graser M,PT Acute Rehab Services [phone]   Bevelyn Buckles 12/05/2022, 9:30 AM

## 2022-12-05 NOTE — Progress Notes (Addendum)
STROKE TEAM PROGRESS NOTE   INTERVAL HISTORY His wife is at the bedside in the ER this morning.    Patient states that Saturday morning woke with symptoms left leg "wouldn't cooperate with the right leg" and his left hand was weaker than usual.  MRI brain revealed a small posterior right frontal lobe infarction.  Patient has atrial fibrillation for which he takes Xarelto.  Discussed appropriate medication compliance of Xarelto with patient's family.  Take around the same time every night with meals.  Patient's family verbalized preference to continue Xarelto use rather than transition to Eliquis twice daily dosing.  Decision is based on patient preference.  MRA head without contrast is negative for LVO or significant stenosis.  Carotid doppler imaging pending  B12 significantly low at 73  Vitals:   12/05/22 0615 12/05/22 0630 12/05/22 0645 12/05/22 0700  BP: 133/81 (!) 159/79 (!) 167/89 (!) 144/83  Pulse: (!) 39 92 95 96  Resp: 20 15 18 16   Temp:      TempSrc:      SpO2: 97% 97% 97% 98%  Weight:      Height:       CBC:  Recent Labs  Lab 12/04/22 2125 12/05/22 0240  WBC 7.3 7.4  NEUTROABS  --  5.8  HGB 14.6 13.4  HCT 40.2 36.9*  MCV 101.5* 101.1*  PLT 164 189   Basic Metabolic Panel:  Recent Labs  Lab 12/04/22 2125 12/05/22 0240  NA 134* 135  K 3.2* 3.0*  CL 96* 98  CO2 25 25  GLUCOSE 226* 129*  BUN 19 18  CREATININE 1.41* 1.19  CALCIUM 8.9 8.7*   Lipid Panel:  Recent Labs  Lab 12/05/22 0024  CHOL 163  TRIG 71  HDL 69  CHOLHDL 2.4  VLDL 14  LDLCALC 80   HgbA1c:  Recent Labs  Lab 12/05/22 0024  HGBA1C 4.7*   Urine Drug Screen: No results for input(s): "LABOPIA", "COCAINSCRNUR", "LABBENZ", "AMPHETMU", "THCU", "LABBARB" in the last 168 hours.  Alcohol Level  Recent Labs  Lab 12/05/22 0024  ETH <10   IMAGING past 24 hours MR BRAIN WO CONTRAST  Result Date: 12/05/2022 CLINICAL DATA:  TIA EXAM: MRI HEAD WITHOUT CONTRAST MRA HEAD WITHOUT CONTRAST  TECHNIQUE: Multiplanar, multi-echo pulse sequences of the brain and surrounding structures were acquired without intravenous contrast. Angiographic images of the Circle of Willis were acquired using MRA technique without intravenous contrast. COMPARISON:  No prior MRI available, correlation is made with CT head 12/04/2022 FINDINGS: MRI HEAD FINDINGS Brain: Small focus of restricted diffusion with ADC correlate in the posterior right frontal lobe white matter and cortex (series 5, images 82-84), measuring up to 6 x 9 x 4 mm (AP x TR x CC). No acute hemorrhage, mass, mass effect, or midline shift. No hydrocephalus or extra-axial collection. Normal pituitary and craniocervical junction. Punctate focus of hemosiderin deposition in the left temporal occipital region, likely sequela prior hypertensive microhemorrhage. No evidence of superficial siderosis. Advanced cerebral volume loss for age. Confluent and scattered T2 hyperintense signal in the periventricular white matter and pons, likely the sequela of mild-to-moderate chronic small vessel ischemic disease. Remote lacunar infarcts in the left cerebellar hemisphere. Dilated perivascular spaces in the basal ganglia. Vascular: Normal arterial flow voids. Skull and upper cervical spine: Normal marrow signal. Sinuses/Orbits: Air-fluid level in the right maxillary sinus. Mucosal thickening in the ethmoid air cells. Status post bilateral lens replacements. Other: The mastoids are well aerated. MRA HEAD FINDINGS Anterior circulation: Both internal  carotid arteries are patent to the termini, without significant stenosis. A1 segments patent. Normal anterior communicating artery. Anterior cerebral arteries are patent to their distal aspects. No M1 stenosis or occlusion. Normal MCA bifurcations. Distal MCA branches perfused and symmetric. Posterior circulation: Vertebral arteries patent to the vertebrobasilar junction without stenosis. Basilar patent to its distal aspect.  Superior cerebellar arteries patent bilaterally. Patent P1 segments. PCAs perfused to their distal aspects without stenosis. The bilateral posterior communicating arteries are not visualized. Anatomic variants: None significant IMPRESSION: 1. Small acute infarct in the posterior right frontal lobe white matter and cortex, which measures up to 9 mm in greatest dimension. 2. No intracranial large vessel occlusion or significant stenosis. These results were called by telephone at the time of interpretation on 12/05/2022 at 12:22 am to provider [facility] , who verbally acknowledged these results. Electronically Signed   By: Wiliam Ke M.D.   On: 12/05/2022 00:23   MR ANGIO HEAD WO CONTRAST  Result Date: 12/05/2022 CLINICAL DATA:  TIA EXAM: MRI HEAD WITHOUT CONTRAST MRA HEAD WITHOUT CONTRAST TECHNIQUE: Multiplanar, multi-echo pulse sequences of the brain and surrounding structures were acquired without intravenous contrast. Angiographic images of the Circle of Willis were acquired using MRA technique without intravenous contrast. COMPARISON:  No prior MRI available, correlation is made with CT head 12/04/2022 FINDINGS: MRI HEAD FINDINGS Brain: Small focus of restricted diffusion with ADC correlate in the posterior right frontal lobe white matter and cortex (series 5, images 82-84), measuring up to 6 x 9 x 4 mm (AP x TR x CC). No acute hemorrhage, mass, mass effect, or midline shift. No hydrocephalus or extra-axial collection. Normal pituitary and craniocervical junction. Punctate focus of hemosiderin deposition in the left temporal occipital region, likely sequela prior hypertensive microhemorrhage. No evidence of superficial siderosis. Advanced cerebral volume loss for age. Confluent and scattered T2 hyperintense signal in the periventricular white matter and pons, likely the sequela of mild-to-moderate chronic small vessel ischemic disease. Remote lacunar infarcts in the left cerebellar hemisphere. Dilated  perivascular spaces in the basal ganglia. Vascular: Normal arterial flow voids. Skull and upper cervical spine: Normal marrow signal. Sinuses/Orbits: Air-fluid level in the right maxillary sinus. Mucosal thickening in the ethmoid air cells. Status post bilateral lens replacements. Other: The mastoids are well aerated. MRA HEAD FINDINGS Anterior circulation: Both internal carotid arteries are patent to the termini, without significant stenosis. A1 segments patent. Normal anterior communicating artery. Anterior cerebral arteries are patent to their distal aspects. No M1 stenosis or occlusion. Normal MCA bifurcations. Distal MCA branches perfused and symmetric. Posterior circulation: Vertebral arteries patent to the vertebrobasilar junction without stenosis. Basilar patent to its distal aspect. Superior cerebellar arteries patent bilaterally. Patent P1 segments. PCAs perfused to their distal aspects without stenosis. The bilateral posterior communicating arteries are not visualized. Anatomic variants: None significant IMPRESSION: 1. Small acute infarct in the posterior right frontal lobe white matter and cortex, which measures up to 9 mm in greatest dimension. 2. No intracranial large vessel occlusion or significant stenosis. These results were called by telephone at the time of interpretation on 12/05/2022 at 12:22 am to provider [facility] , who verbally acknowledged these results. Electronically Signed   By: Wiliam Ke M.D.   On: 12/05/2022 00:23   CT HEAD WO CONTRAST  Result Date: 12/04/2022 CLINICAL DATA:  Transient ischemic attack (TIA) EXAM: CT HEAD WITHOUT CONTRAST TECHNIQUE: Contiguous axial images were obtained from the base of the skull through the vertex without intravenous contrast. RADIATION DOSE REDUCTION: This exam  was performed according to the departmental dose-optimization program which includes automated exposure control, adjustment of the mA and/or kV according to patient size and/or use of  iterative reconstruction technique. COMPARISON:  None Available. FINDINGS: Brain: Normal anatomic configuration. Parenchymal volume loss is commensurate with the patient's age. Mild periventricular white matter changes are present likely reflecting the sequela of small vessel ischemia. No abnormal intra or extra-axial mass lesion or fluid collection. No abnormal mass effect or midline shift. No evidence of acute intracranial hemorrhage or infarct. Ventricular size is normal. Cerebellum unremarkable. Vascular: No asymmetric hyperdense vasculature at the skull base. Skull: Intact Sinuses/Orbits: There are several opacified ethmoid air cells bilaterally as well as small layering fluid within the right maxillary sinus. Remaining paranasal sinuses are clear. Orbits are unremarkable. Other: Mastoid air cells and middle ear cavities are clear. IMPRESSION: 1. No acute intracranial hemorrhage or infarct. 2. Mild senescent change. 3. Mild paranasal sinus disease. Electronically Signed   By: Helyn Numbers M.D.   On: 12/04/2022 23:37   DG Chest 2 View  Result Date: 12/04/2022 CLINICAL DATA:  Weakness EXAM: CHEST - 2 VIEW COMPARISON:  [id number] FINDINGS: The lungs are stably hyperinflated in keeping with changes of underlying COPD. Nodular opacity at the right lung base may represent a confluence of vascular and osseous shadows, however, an underlying pulmonary nodule is difficult to exclude. The lungs are otherwise clear. No pneumothorax or pleural effusion. Coronary artery bypass grafting has been performed. Cardiac size is within normal limits. Pulmonary vascularity is normal. No acute bone abnormality. IMPRESSION: 1. No radiographic evidence of acute cardiopulmonary disease. 2. COPD. 3. Possible right basilar pulmonary nodule. This could be further assessed with nonemergent CT imaging. Electronically Signed   By: Helyn Numbers M.D.   On: 12/04/2022 22:19    PHYSICAL EXAM Constitutional: Appears well-developed and  well-nourished pleasant elderly Caucasian male.  Psych: Affect appropriate to situation, calm and cooperative with exam Eyes: No scleral injection HENT: No OP obstrucion MSK: no joint deformities or swelling Cardiovascular: Irregular rate and rhythm on cardiac monitor  Respiratory: Effort normal, non-labored breathing on room air SpO2 99% GI: Soft.  No distension. There is no tenderness.  Skin: WDI, left elbow and right forearm wrapped with gauze and coban  Neuro: Mental Status: Patient is awake, alert, oriented to person, place, month, year, and situation. Patient is able to give a clear and coherent history. No signs of aphasia or neglect Cranial Nerves: II: Visual Fields are full. Pupils are equal, round, and reactive to light.   III,IV, VI: EOMI without ptosis or diploplia.  V: Facial sensation is intact and symmetric to light touch VII: Subtle left face asymmetry VIII: Hearing is intact to loud voice X: Palate elevates symmetrically XI: Shoulder shrug is symmetric. XII: Tongue protrudes midline Motor: Tone is normal. Bulk is normal. 5/5 strength was present in all four extremities proximally without vertical drift.  Diminished fine motor movement on the left, minimal weakness of the left hand grip strength Sensory: Sensation is symmetric to light touch in the arms and legs. No extinction to DSS present.  Cerebellar: FNF and HKS are intact bilaterally  ASSESSMENT/PLAN Mr. ARTEEN MORAITIS is a 87 y.o. male with history of HTN, HLD, CAD s/p CABG 1995 and stent in 2004, and atrial fibrillation on Xarelto without missed doses presenting with left lower extremity weakness and left hand weakness with MRI findings concerning for a small posterior right frontal lobe white matter and cortex infarct.   Stroke: Acute  small posterior right frontal lobe white matter and cortex infarct likely cardioembolic in nature with history of atrial fibrillation versus small vessel disease CT head no  acute intracranial hemorrhage or infarct MRI small acute infarct in the posterior right frontal lobe white matter and cortex, which measures up to 9 mm in greatest dimension.   MRA No intracranial large vessel occlusion or significant stenosis. Carotid Doppler  pending 2D Echo deferred as it will not change management at this time LDL 80 HgbA1c 4.7 VTE prophylaxis - Ambulate as tolerated, patient on full anticoagulation     Diet   Diet Heart Room service appropriate? Yes; Fluid consistency: Thin   Xarelto (rivaroxaban) daily prior to admission, now on Xarelto (rivaroxaban) daily.  Takes Xarelto with meals at the same time daily. Therapy recommendations:  Outpatient PT/OT Disposition:  Pending discharge home if vitals remain stable   Hypertension Home meds:  Norvasc, Toprol-XL Stable Continue home meds Permissive hypertension (OK if < 220/120) but gradually normalize in 5-7 days Long-term BP goal normotensive  Hyperlipidemia Home meds:  simvastatin, resumed in hospital LDL 80, goal < 70 Increase home simvastatin to 40 mg PO daily  High intensity statin not indicated as patient is near goal with current regimen  Continue statin at discharge  Other Stroke Risk Factors Advanced Age >/= 57  Former cigarette smoker Family hx stroke (patient's mother) Coronary artery disease  Other Active Problems B12 deficiency,  Serum B12 level of 73 IM supplementation required  Hospital day # 0  Lanae Boast, AGACNP-BC [facility] [phone]  STROKE MD NOTE : I have personally obtained history,examined this patient, reviewed notes, independently viewed imaging studies, participated in medical decision making and plan of care.ROS completed by me personally and pertinent positives fully documented  I have made any additions or clarifications directly to the above note. Agree with note above.  Patient presented with left-sided weakness due to small right frontal  periventricular white matter lacunar infarct in the setting of atrial fibrillation on anticoagulation with Xarelto.  Continue ongoing stroke workup.  Long discussion with patient and wife regarding alternatives to anticoagulation with Xarelto like switching to Eliquis or Pradaxa twice daily not necessarily having been proven previously.  Continuing Xarelto.  Patient and wife need time to think about this.  Will plan to discuss this with his primary cardiologist Dr. [place] as an outpatient.  Patient was counseled to take Xarelto consistently at the same time every day preferably with a good meal.  Continue ongoing therapies.  Follow-up as outpatient stroke clinic with nurse practitioner in 2 months.  Greater than 50% time during this 50-minute visit was spent in counseling and coordination of care and discussion with patient and care team and answering questions.  Discussed with Dr. Sela Hua, MD Medical Director [facility] Pager: [phone] 12/05/2022 1:08 PM  To contact Stroke Continuity provider, please refer to [email or web address]. After hours, contact General Neurology

## 2022-12-05 NOTE — Consult Note (Signed)
Neurology Consultation Reason for Consult: Left sided weakness Requesting Physician: Si Raider  CC: Left hand and leg weakness  History is obtained from: Patient and wife at bedside  HPI: Larry Briggs is a 87 y.o. male w/ a PMHx significant for HTN, HLD< CAD s/p CABG 1995 and stent 2004,   He noticed in the morning on awakening that his gait was off, was normal when he went to bed. It wasn't until later in the day when trying to take the trash out that he nearly fell. Called his sister to talk to her about his symptoms and she convinced him to come to the ED for evaluation for stroke, which was confirmed on MRI and neurology was consulted   LKW: 5/3 evening before bed Thrombolytic given?: No, on Xarelto and out of the window IA performed?: No, exam not c/w LVO Premorbid modified rankin scale:     1 - No significant disability. Able to carry out all usual activities, despite some symptoms. (Uses a cane at times)  ROS: All other review of systems was negative except for increased cough in the past month which they attribute to pollen  Past Medical History:  Diagnosis Date   CAD (coronary artery disease)    Remote MI in 1980. CABG 1995. Stent to left main in 2004. Last cath in April of 2012. EF 50%; patent LIMA to DX/LAD with collateralization of the distal right, patent SVG to OM, occluded SVG to distal RCA, and patent stent to LAD   History of heart attack 1980   Hypercholesteremia    Hypertension    Increased glucose level    Obesity    Family History  Problem Relation Age of Onset   Heart attack Father    Hypertension Father    Stroke Mother    Social History:  reports that he quit smoking about 28 years ago. His smoking use included cigarettes. He started smoking about 69 years ago. He has a 30.00 pack-year smoking history. He has never used smokeless tobacco. He reports that he does not drink alcohol and does not use drugs.  Exam: Current vital signs: BP (!) 144/83    Pulse 96   Temp 97.9 F (36.6 C) (Oral)   Resp 16   Ht 5\' 10"  (1.778 m)   Wt 73.2 kg   SpO2 98%   BMI 23.16 kg/m  Vital signs in last 24 hours: Temp:  [97.9 F (36.6 C)-98.2 F (36.8 C)] 97.9 F (36.6 C) (05/05 0601) Pulse Rate:  [26-110] 96 (05/05 0700) Resp:  [15-29] 16 (05/05 0700) BP: (110-167)/(59-97) 144/83 (05/05 0700) SpO2:  [96 %-100 %] 98 % (05/05 0700) Weight:  [73.2 kg] 73.2 kg (05/04 2107)   Physical Exam  Constitutional: Appears well-developed and well-nourished.  Psych: Affect pleasant and cooperative  Eyes: No scleral injection HENT: No oropharyngeal obstruction.  MSK: no joint deformities.  Cardiovascular: Perfusing extremities well Respiratory: Effort normal, non-labored breathing GI: Soft.  No distension. There is no tenderness.  Skin: Warm dry and intact visible skin  Neuro: Mental Status: Patient is awake, alert, oriented to person, place, month, year, and situation. Patient is able to give a clear and coherent history. No signs of aphasia or neglect Cranial Nerves: II: Visual Fields are full. Pupils are equal, round, and reactive to light.   III,IV, VI: EOMI without ptosis or diploplia. Mildly saccadic pursuits V: Facial sensation is symmetric to temperature VII: Facial movement is symmetric.  VIII: hearing is intact to voice X:  Uvula elevates symmetrically XI: Shoulder shrug is symmetric. XII: tongue is midline without atrophy or fasciculations.  Motor: Tone is normal. Bulk is normal. 5/5 strength was present in all four extremities. Mild pronation of the LUE without drift Sensory: Sensation is symmetric throughout  Deep Tendon Reflexes: 2+ and symmetric in the brachioradialis and patellae.  Cerebellar: FNF and HKS are dysmetric on the left Gait:  Deferred in acute setting   NIHSS total 3 Score breakdown: Mild left facial droop, left upper and left lower extremity ataxia    I have reviewed labs in epic and the results pertinent to  this consultation are:  Basic Metabolic Panel: Recent Labs  Lab 12/04/22 01-01-2124 12/05/22 0240  NA 134* 135  K 3.2* 3.0*  CL 96* 98  CO2 25 25  GLUCOSE 226* 129*  BUN 19 18  CREATININE 1.41* 1.19  CALCIUM 8.9 8.7*    CBC: Recent Labs  Lab 12/04/22 01-01-24 12/05/22 0240  WBC 7.3 7.4  NEUTROABS  --  5.8  HGB 14.6 13.4  HCT 40.2 36.9*  MCV 101.5* 101.1*  PLT 164 189    Coagulation Studies: Recent Labs    12/04/22 01/01/2215  LABPROT 17.6*  INR 1.5*    I have reviewed the images obtained:  MRI brain reviewed personally with patient and wife at bedside MRA head 1. Small acute infarct in the posterior right frontal lobe white matter and cortex, which measures up to 9 mm in greatest dimension. 2. No intracranial large vessel occlusion or significant stenosis.   Impression: Embolic stroke, likely Xarelto failure but need to rule out symptomatic cartoid stenosis. Due to increased cough, cardiac history, and length of time since last ECHO also reasonable to repeat ECHO though would not strictly change management since he has a long term indication for anticoagulation   Recommendations: # Embolic appearing stroke - Stroke labs  HgbA1c, fasting lipid panel - Carotid Duplex  - Frequent neuro checks - Echocardiogram - Consider change from Xarelto to Eliquis due to Xarleto failure (no missed doses per patient);  - hold anticoagulation for now due to acute stroke, resumption per stroke team; ASA 325 mg daily until resumed - Risk factor modification - Telemetry monitoring - Blood pressure goal  - Permissive hypertension to 220/120 due to acute stroke - PT consult, OT consult, Speech consult, - Stroke team to follow   Brooke Dare MD-PhD [facility] [phone] Available 7 PM to 7 AM, outside of these hours please call Neurologist on call as listed on Amion.

## 2022-12-05 NOTE — Progress Notes (Signed)
OT Cancellation Note and Discharge  Patient Details Name: Larry Briggs MRN: [id number] DOB: [birth year]   Cancelled Treatment:    Reason Eval/Treat Not Completed: OT screened, no needs identified, will sign off. Received chat text from evaluating PT that pt does have some LUE issues, but are resolving quickly and pt able to put socks on without issues and has wife with him 24/7 that can A prn so no need for OT evaluation. Evaluating PT is recommending OPPT/OT for pt.  Lindon Romp OT Acute Rehabilitation Services Office [phone]    Evette Georges 12/05/2022, 9:44 AM

## 2022-12-05 NOTE — Care Management CC44 (Signed)
Condition Code 44 Documentation Completed  Patient Details  Name: Larry Briggs MRN: [id number] Date of Birth: [birth year]   Condition Code 44 given:  Yes Patient signature on Condition Code 44 notice:  Yes Documentation of 2 MD's agreement:  Yes Code 44 added to claim:       Lockie Pares, RN 12/05/2022, 11:50 AM

## 2022-12-05 NOTE — ED Notes (Signed)
This RN and provider reviewed discharge instructions with patient and wife. Both verbalized understanding and denied any further questions. PT well appearing upon discharge and reports tolerable pain. Pt wheeled in wheelchair to exit. Pt endorses ride home.

## 2022-12-05 NOTE — Discharge Summary (Signed)
Physician Discharge Summary  Larry Briggs:[id number] DOB: August 09, 1935 DOA: 12/04/2022  PCP: Merri Brunette, MD  Admit date: 12/04/2022 Discharge date: 12/05/2022  Admitted From: Home Discharge disposition: Home  Recommendations at discharge:  Recommended to improve compliance to Xarelto Recommended to double the dose of simvastatin to 40 mg daily Outpatient PT/OT   Brief narrative: Larry Briggs is a 87 y.o. male with PMH significant for HTN, HLD, A-fib, CAD, remote MI 1980/CABG 1995/stents who lives at home, uses a cane for ambulation. 5/4, he woke up in the morning and noted that he was having issues with his left hand.  He had difficulty picking up a bottle.  He found he had to use both hands to hold it.  He also noted his left leg felt weaker than the right.  Presented to the ED several hours later.  In the ED, patient was afebrile, heart rate 110, blood pressure 148/77, breathing on room air. Labs showed WBC count of 7.3, hemoglobin 14.6, sodium 134, potassium 3.2, glucose 226, creatinine 1.41, troponin normal CT head was unremarkable.   MRI brain showed a small acute infarct in the posterior right frontal lobe white matter and cortex measuring up to 9 mm.   MRA head did not show any evidence of intracranial large vessel occlusion or significant stenosis. Neurology consulted Admitted to [facility].  Subjective: Patient was seen and examined this morning.  Pleasant elderly Caucasian male.  Sitting up in chair.  Not in distress.  Neurologist Dr. Ailene Ards was at bedside at the time of my evaluation.  Had a long conversation with patient and his wife at bedside about his diagnosis and recommendations. Chart reviewed Remains hemodynamically stable Labs this morning with potassium low at 3, creatinine improved, vitamin B12 significantly low at 73, A1c 4.7, LDL 80, HDL 69  Assessment and plan: Acute CVA Presented several hours later after the onset of acute weakness of left hand and  left leg  MRI brain showed a small acute infarct in the posterior right frontal lobe  Stroke workup initiated.   Neurology consult appreciated. Patient has some weakness in the left hand grip on exam. PTA, patient was on Xarelto 15 mg daily for A-fib under the care of his cardiologist Dr. Swaziland.  Per family, the dose was reduced from 20 mg to 50 mg recently by him.  Per our conversation, patient seems to be taking Xarelto daily but not consistently with food or at the same time of the day.  Recommended to improve compliance.  Family to have a conversation with Dr. Swaziland as an outpatient to see if switching to Eliquis would be a preferable option.   A1c 4.7, LDL 80.  Neurology recommended to double the dose of simvastatin to 40 mg daily. Seen by PT/OT.  Outpatient PT OT recommended.  Hypokalemia Potassium remains low.  3 today.  Replacement given Recent Labs  Lab 12/04/22 2125 12/05/22 0240  K 3.2* 3.0*   AKI Creatinine was elevated on admission, improved on repeat blood work this morning.  Continue to monitor Recent Labs    12/04/22 2125 12/05/22 0240  BUN 19 18  CREATININE 1.41* 1.19   Vitamin B12 deficiency Vitamin B12 is significant low at 73.  IM and oral replacement started. Recent Labs    12/04/22 2125 12/05/22 0240  MCV 101.5* 101.1*  VITAMINB12  --  73*  FOLATE  --  21.3   Type 2 diabetes mellitus A1c 4.7 PTA not on meds. Continue diet control. Recent  Labs  Lab 12/05/22 0409 12/05/22 0808  GLUCAP 130* 178*   Essential hypertension PTA on Toprol 25 mg daily, chlorthalidone 25 mg daily, Norvasc 5 mg daily Blood pressure and heart rate running elevated.  Metoprolol resumed this morning.  Okay to resume chlorthalidone and Norvasc tomorrow.  CAD/CABG/stents HLD Xarelto 15 mg daily, simvastatin 20 mg daily   COPD Chest x-ray on admission unremarkable. Continue bronchodilators, Mucinex Chronically on azithromycin 250 mg 3 times a week MWF.    Hypercholesteremia Statin  Mild hematuria H/o BPH Wife this morning complain of mild hematuria.  Urinalysis showed small amount of hemoglobin.  No evidence of infection.  No need of antibiotics. Continue Flomax.  Goals of care   Code Status: Prior   Wounds:  - Incision (Closed) 09/05/17 Hip Left (Active)  Date First Assessed/Time First Assessed: 09/05/17 1612   Location: Hip  Location Orientation: Left    Assessments 09/05/2017  4:45 PM 09/09/2017  8:07 AM  Dressing Type Silicone dressing Silicone dressing  Dressing Clean;Dry;Intact Old drainage (marked)  Site / Wound Assessment -- Dressing in place / Unable to assess  Drainage Amount None --     No associated orders.    Discharge Exam:   Vitals:   12/05/22 0745 12/05/22 0900 12/05/22 1006 12/05/22 1030  BP: (!) 155/82 (!) 142/75 (!) 142/92 124/75  Pulse: 96 94 100 60  Resp: (!) 22 (!) 26  (!) 23  Temp:      TempSrc:      SpO2: 97% 92%  98%  Weight:      Height:        Body mass index is 23.16 kg/m.  General exam: Pleasant, elderly Caucasian male.  Not in distress Skin: No rashes, lesions or ulcers. HEENT: Atraumatic, normocephalic, no obvious bleeding Lungs: Clear to auscultation bilaterally CVS: Regular rate and rhythm, no murmur GI/Abd soft, nontender, nondistended, bowel sound present CNS: Alert, awake, oriented x 3.  Left hand grip slightly weak Psychiatry: Mood appropriate Extremities: No pedal edema, no calf tenderness  Follow ups:    Follow-up Information     Merri Brunette, MD Follow up.   Specialty: Internal Medicine Contact information: [address] [place] [place] [address] [phone]         [facility] Follow up.   Specialty: Neurology Contact information: [address] [place] [address] [phone]                Discharge Instructions:   Discharge Instructions     Call MD for:  difficulty  breathing, headache or visual disturbances   Complete by: As directed    Call MD for:  extreme fatigue   Complete by: As directed    Call MD for:  hives   Complete by: As directed    Call MD for:  persistant dizziness or light-headedness   Complete by: As directed    Call MD for:  persistant nausea and vomiting   Complete by: As directed    Call MD for:  severe uncontrolled pain   Complete by: As directed    Call MD for:  temperature >100.4   Complete by: As directed    Diet general   Complete by: As directed    Discharge instructions   Complete by: As directed    Recommendations at discharge:   Recommended to improve compliance to Xarelto  Recommended to double the dose of simvastatin to 40 mg daily  Outpatient PT/OT  General discharge instructions: Follow with Primary MD Merri Brunette, MD in 7 days  Please request your PCP  to go over your hospital tests, procedures, radiology results at the follow up. Please get your medicines reviewed and adjusted.  Your PCP may decide to repeat certain labs or tests as needed. Do not drive, operate heavy machinery, perform activities at heights, swimming or participation in water activities or provide baby sitting services if your were admitted for syncope or siezures until you have seen by Primary MD or a Neurologist and advised to do so again. [place] Controlled Substance Reporting System database was reviewed. Do not drive, operate heavy machinery, perform activities at heights, swim, participate in water activities or provide baby-sitting services while on medications for pain, sleep and mood until your outpatient physician has reevaluated you and advised to do so again.  You are strongly recommended to comply with the dose, frequency and duration of prescribed medications. Activity: As tolerated with Full fall precautions use walker/cane & assistance as needed Avoid using any recreational substances like cigarette, tobacco, alcohol,  or non-prescribed drug. If you experience worsening of your admission symptoms, develop shortness of breath, life threatening emergency, suicidal or homicidal thoughts you must seek medical attention immediately by calling 911 or calling your MD immediately  if symptoms less severe. You must read complete instructions/literature along with all the possible adverse reactions/side effects for all the medicines you take and that have been prescribed to you. Take any new medicine only after you have completely understood and accepted all the possible adverse reactions/side effects.  Wear Seat belts while driving. You were cared for by a hospitalist during your hospital stay. If you have any questions about your discharge medications or the care you received while you were in the hospital after you are discharged, you can call the unit and ask to speak with the hospitalist or the covering physician. Once you are discharged, your primary care physician will handle any further medical issues. Please note that NO REFILLS for any discharge medications will be authorized once you are discharged, as it is imperative that you return to your primary care physician (or establish a relationship with a primary care physician if you do not have one).   Increase activity slowly   Complete by: As directed        Discharge Medications:   Allergies as of 12/05/2022       Reactions   Viagra [sildenafil]    Other reaction(s): blue haze   Iodinated Contrast Media Nausea And Vomiting, Rash        Medication List     TAKE these medications    acetaminophen 650 MG CR tablet Commonly known as: TYLENOL Take 650 mg by mouth every 8 (eight) hours as needed.   amLODipine 5 MG tablet Commonly known as: NORVASC TAKE 1 TABLET EVERY DAY   azithromycin 250 MG tablet Commonly known as: ZITHROMAX TAKE 1 TABLET 3  TIMES A WEEK ON MONDAY, WEDNESDAY, AND FRIDAY.   chlorthalidone 25 MG tablet Commonly known as:  HYGROTON Take 1 tablet (25 mg total) by mouth daily.   cyanocobalamin 1000 MCG tablet Take 1 tablet (1,000 mcg total) by mouth daily. Start taking on: Dec 06, 2022   docusate sodium 100 MG capsule Commonly known as: COLACE Take 100 mg by mouth as needed.   doxylamine (Sleep) 25 MG tablet Commonly known as: UNISOM Take 25 mg by mouth at bedtime as needed.   Ester-C 500-550 MG Tabs Take  500 mg by mouth daily.   Fish Oil 500 MG Caps Take by mouth.   Flutter Devi Use as directed   guaiFENesin 600 MG 12 hr tablet Commonly known as: MUCINEX Take by mouth 2 (two) times daily as needed.   metoprolol succinate 25 MG 24 hr tablet Commonly known as: TOPROL-XL TAKE 1 TABLET EVERY DAY   nitroGLYCERIN 0.4 MG SL tablet Commonly known as: Nitrostat Place 1 tablet (0.4 mg total) under the tongue as needed.   Rivaroxaban 15 MG Tabs tablet Commonly known as: XARELTO Take 1 tablet (15 mg total) by mouth daily with supper.   silver sulfADIAZINE 1 % cream Commonly known as: SILVADENE Apply 1 Application topically daily as needed.   simvastatin 40 MG tablet Commonly known as: ZOCOR Take 1 tablet (40 mg total) by mouth daily at 6 PM. What changed:  medication strength how much to take   SYSTANE ULTRA OP Apply 1 drop to eye 2 (two) times daily.   tamsulosin 0.4 MG Caps capsule Commonly known as: FLOMAX Take 0.4 mg by mouth daily after supper.   Trelegy Ellipta 100-62.5-25 MCG/ACT Aepb Generic drug: Fluticasone-Umeclidin-Vilant Inhale 1 puff into the lungs daily.   Ventolin HFA 108 (90 Base) MCG/ACT inhaler Generic drug: albuterol INHALE 2 PUFFS BY MOUTH EVERY 4 HOURS AS NEEDED FOR WHEEZING FOR SHORTNESS OF BREATH         The results of significant diagnostics from this hospitalization (including imaging, microbiology, ancillary and laboratory) are listed below for reference.    Procedures and Diagnostic Studies:   MR BRAIN WO CONTRAST  Result Date:  12/05/2022 CLINICAL DATA:  TIA EXAM: MRI HEAD WITHOUT CONTRAST MRA HEAD WITHOUT CONTRAST TECHNIQUE: Multiplanar, multi-echo pulse sequences of the brain and surrounding structures were acquired without intravenous contrast. Angiographic images of the Circle of Willis were acquired using MRA technique without intravenous contrast. COMPARISON:  No prior MRI available, correlation is made with CT head 12/04/2022 FINDINGS: MRI HEAD FINDINGS Brain: Small focus of restricted diffusion with ADC correlate in the posterior right frontal lobe white matter and cortex (series 5, images 82-84), measuring up to 6 x 9 x 4 mm (AP x TR x CC). No acute hemorrhage, mass, mass effect, or midline shift. No hydrocephalus or extra-axial collection. Normal pituitary and craniocervical junction. Punctate focus of hemosiderin deposition in the left temporal occipital region, likely sequela prior hypertensive microhemorrhage. No evidence of superficial siderosis. Advanced cerebral volume loss for age. Confluent and scattered T2 hyperintense signal in the periventricular white matter and pons, likely the sequela of mild-to-moderate chronic small vessel ischemic disease. Remote lacunar infarcts in the left cerebellar hemisphere. Dilated perivascular spaces in the basal ganglia. Vascular: Normal arterial flow voids. Skull and upper cervical spine: Normal marrow signal. Sinuses/Orbits: Air-fluid level in the right maxillary sinus. Mucosal thickening in the ethmoid air cells. Status post bilateral lens replacements. Other: The mastoids are well aerated. MRA HEAD FINDINGS Anterior circulation: Both internal carotid arteries are patent to the termini, without significant stenosis. A1 segments patent. Normal anterior communicating artery. Anterior cerebral arteries are patent to their distal aspects. No M1 stenosis or occlusion. Normal MCA bifurcations. Distal MCA branches perfused and symmetric. Posterior circulation: Vertebral arteries patent to the  vertebrobasilar junction without stenosis. Basilar patent to its distal aspect. Superior cerebellar arteries patent bilaterally. Patent P1 segments. PCAs perfused to their distal aspects without stenosis. The bilateral posterior communicating arteries are not visualized. Anatomic variants: None significant IMPRESSION: 1. Small acute infarct in the posterior right  frontal lobe white matter and cortex, which measures up to 9 mm in greatest dimension. 2. No intracranial large vessel occlusion or significant stenosis. These results were called by telephone at the time of interpretation on 12/05/2022 at 12:22 am to provider [facility] , who verbally acknowledged these results. Electronically Signed   By: Wiliam Ke M.D.   On: 12/05/2022 00:23   MR ANGIO HEAD WO CONTRAST  Result Date: 12/05/2022 CLINICAL DATA:  TIA EXAM: MRI HEAD WITHOUT CONTRAST MRA HEAD WITHOUT CONTRAST TECHNIQUE: Multiplanar, multi-echo pulse sequences of the brain and surrounding structures were acquired without intravenous contrast. Angiographic images of the Circle of Willis were acquired using MRA technique without intravenous contrast. COMPARISON:  No prior MRI available, correlation is made with CT head 12/04/2022 FINDINGS: MRI HEAD FINDINGS Brain: Small focus of restricted diffusion with ADC correlate in the posterior right frontal lobe white matter and cortex (series 5, images 82-84), measuring up to 6 x 9 x 4 mm (AP x TR x CC). No acute hemorrhage, mass, mass effect, or midline shift. No hydrocephalus or extra-axial collection. Normal pituitary and craniocervical junction. Punctate focus of hemosiderin deposition in the left temporal occipital region, likely sequela prior hypertensive microhemorrhage. No evidence of superficial siderosis. Advanced cerebral volume loss for age. Confluent and scattered T2 hyperintense signal in the periventricular white matter and pons, likely the sequela of mild-to-moderate chronic small vessel ischemic  disease. Remote lacunar infarcts in the left cerebellar hemisphere. Dilated perivascular spaces in the basal ganglia. Vascular: Normal arterial flow voids. Skull and upper cervical spine: Normal marrow signal. Sinuses/Orbits: Air-fluid level in the right maxillary sinus. Mucosal thickening in the ethmoid air cells. Status post bilateral lens replacements. Other: The mastoids are well aerated. MRA HEAD FINDINGS Anterior circulation: Both internal carotid arteries are patent to the termini, without significant stenosis. A1 segments patent. Normal anterior communicating artery. Anterior cerebral arteries are patent to their distal aspects. No M1 stenosis or occlusion. Normal MCA bifurcations. Distal MCA branches perfused and symmetric. Posterior circulation: Vertebral arteries patent to the vertebrobasilar junction without stenosis. Basilar patent to its distal aspect. Superior cerebellar arteries patent bilaterally. Patent P1 segments. PCAs perfused to their distal aspects without stenosis. The bilateral posterior communicating arteries are not visualized. Anatomic variants: None significant IMPRESSION: 1. Small acute infarct in the posterior right frontal lobe white matter and cortex, which measures up to 9 mm in greatest dimension. 2. No intracranial large vessel occlusion or significant stenosis. These results were called by telephone at the time of interpretation on 12/05/2022 at 12:22 am to provider [facility] , who verbally acknowledged these results. Electronically Signed   By: Wiliam Ke M.D.   On: 12/05/2022 00:23   CT HEAD WO CONTRAST  Result Date: 12/04/2022 CLINICAL DATA:  Transient ischemic attack (TIA) EXAM: CT HEAD WITHOUT CONTRAST TECHNIQUE: Contiguous axial images were obtained from the base of the skull through the vertex without intravenous contrast. RADIATION DOSE REDUCTION: This exam was performed according to the departmental dose-optimization program which includes automated exposure control,  adjustment of the mA and/or kV according to patient size and/or use of iterative reconstruction technique. COMPARISON:  None Available. FINDINGS: Brain: Normal anatomic configuration. Parenchymal volume loss is commensurate with the patient's age. Mild periventricular white matter changes are present likely reflecting the sequela of small vessel ischemia. No abnormal intra or extra-axial mass lesion or fluid collection. No abnormal mass effect or midline shift. No evidence of acute intracranial hemorrhage or infarct. Ventricular size is normal. Cerebellum unremarkable. Vascular:  No asymmetric hyperdense vasculature at the skull base. Skull: Intact Sinuses/Orbits: There are several opacified ethmoid air cells bilaterally as well as small layering fluid within the right maxillary sinus. Remaining paranasal sinuses are clear. Orbits are unremarkable. Other: Mastoid air cells and middle ear cavities are clear. IMPRESSION: 1. No acute intracranial hemorrhage or infarct. 2. Mild senescent change. 3. Mild paranasal sinus disease. Electronically Signed   By: Helyn Numbers M.D.   On: 12/04/2022 23:37   DG Chest 2 View  Result Date: 12/04/2022 CLINICAL DATA:  Weakness EXAM: CHEST - 2 VIEW COMPARISON:  [id number] FINDINGS: The lungs are stably hyperinflated in keeping with changes of underlying COPD. Nodular opacity at the right lung base may represent a confluence of vascular and osseous shadows, however, an underlying pulmonary nodule is difficult to exclude. The lungs are otherwise clear. No pneumothorax or pleural effusion. Coronary artery bypass grafting has been performed. Cardiac size is within normal limits. Pulmonary vascularity is normal. No acute bone abnormality. IMPRESSION: 1. No radiographic evidence of acute cardiopulmonary disease. 2. COPD. 3. Possible right basilar pulmonary nodule. This could be further assessed with nonemergent CT imaging. Electronically Signed   By: Helyn Numbers M.D.   On: 12/04/2022  22:19     Labs:   Basic Metabolic Panel: Recent Labs  Lab 12/04/22 2125 12/05/22 0240  NA 134* 135  K 3.2* 3.0*  CL 96* 98  CO2 25 25  GLUCOSE 226* 129*  BUN 19 18  CREATININE 1.41* 1.19  CALCIUM 8.9 8.7*   GFR Estimated Creatinine Clearance: 47.7 mL/min (by C-G formula based on SCr of 1.19 mg/dL). Liver Function Tests: No results for input(s): "AST", "ALT", "ALKPHOS", "BILITOT", "PROT", "ALBUMIN" in the last 168 hours. No results for input(s): "LIPASE", "AMYLASE" in the last 168 hours. No results for input(s): "AMMONIA" in the last 168 hours. Coagulation profile Recent Labs  Lab 12/04/22 2216  INR 1.5*    CBC: Recent Labs  Lab 12/04/22 2125 12/05/22 0240  WBC 7.3 7.4  NEUTROABS  --  5.8  HGB 14.6 13.4  HCT 40.2 36.9*  MCV 101.5* 101.1*  PLT 164 189   Cardiac Enzymes: No results for input(s): "CKTOTAL", "CKMB", "CKMBINDEX", "TROPONINI" in the last 168 hours. BNP: Invalid input(s): "POCBNP" CBG: Recent Labs  Lab 12/05/22 0409 12/05/22 0808  GLUCAP 130* 178*   D-Dimer No results for input(s): "DDIMER" in the last 72 hours. Hgb A1c Recent Labs    12/05/22 0024  HGBA1C 4.7*   Lipid Profile Recent Labs    12/05/22 0024  CHOL 163  HDL 69  LDLCALC 80  TRIG 71  CHOLHDL 2.4   Thyroid function studies No results for input(s): "TSH", "T4TOTAL", "T3FREE", "THYROIDAB" in the last 72 hours.  Invalid input(s): "FREET3" Anemia work up Recent Labs    12/05/22 0240  VITAMINB12 73*  FOLATE 21.3   Microbiology No results found for this or any previous visit (from the past 240 hour(s)).  Time coordinating discharge: 45 minutes  Signed: Ellieanna Funderburg  [facility] 12/05/2022, 10:54 AM

## 2022-12-05 NOTE — Care Management Obs Status (Signed)
MEDICARE OBSERVATION STATUS NOTIFICATION   Patient Details  Name: ADOLPH SALAMI MRN: [id number] Date of Birth: 06/10/1936   Medicare Observation Status Notification Given:  Yes    Lockie Pares, RN 12/05/2022, 11:50 AM

## 2022-12-05 NOTE — H&P (Signed)
PCP:   Merri Brunette, MD   Chief Complaint:  LLE weakness  HPI: This is a 87 year old gentleman with past medical history of CAD, HLD, hypertension, atrial fibrillation.  At baseline he uses a cane.  Today he noted he was having issues with his left hand, he had difficulty picking up a bottle.  He found he had to use both hands to hold it.  Then he noted his left leg felt weaker than the right.  He called her sister who is a retired Engineer, civil (consulting), she suggested to go to the ER.  The patient denies headache, nausea, vomiting, slurred speech or facial drooping.  He denies any prior history of stroke.  In the ER MRI is positive for small posterior right frontal lobe infarct.  Neurology consulted by EDP.  Admission requested.  Review of Systems:  Per HPI  Past Medical History: Past Medical History:  Diagnosis Date   CAD (coronary artery disease)    Remote MI in 46. CABG 1995. Stent to left main in 2004. Last cath in April of 2012. EF 50%; patent LIMA to DX/LAD with collateralization of the distal right, patent SVG to OM, occluded SVG to distal RCA, and patent stent to LAD   History of heart attack 1980   Hypercholesteremia    Hypertension    Increased glucose level    Obesity    Past Surgical History:  Procedure Laterality Date   CARDIAC CATHETERIZATION  1993   CARDIAC CATHETERIZATION  April 2012   Mild reduction if EF at 50%. Patent LIMA to DX/LAD with collateralization to distal RCA, patent SVG to OM and occluded SVG to distal right and patent stent to LAD/Left main;   CORONARY ARTERY BYPASS GRAFT  05/1994   x5 LIMA to LAD & DX, SVG to LCX, SVG to RCA   CORONARY STENT PLACEMENT  2004   Stent to the L main   INTRAMEDULLARY (IM) NAIL INTERTROCHANTERIC Left 09/05/2017   Procedure: INTRAMEDULLARY (IM) NAIL INTERTROCHANTRIC;  Surgeon: Sheral Apley, MD;  Location: [facility] OR;  Service: Orthopedics;  Laterality: Left;    Medications: Prior to Admission medications   Medication Sig Start Date  End Date Taking? Authorizing Provider  acetaminophen (TYLENOL) 650 MG CR tablet Take 650 mg by mouth every 8 (eight) hours as needed.    [provider]  amLODipine (NORVASC) 5 MG tablet TAKE 1 TABLET EVERY DAY 08/12/22   [place], Peter M, MD  azithromycin (ZITHROMAX) 250 MG tablet TAKE 1 TABLET 3  TIMES A WEEK ON MONDAY, WEDNESDAY, AND FRIDAY. 12/02/21   Hunsucker, Lesia Sago, MD  Bioflavonoid Products (ESTER-C) 500-550 MG TABS Take 500 mg by mouth daily.    [provider]  chlorthalidone (HYGROTON) 25 MG tablet Take 1 tablet (25 mg total) by mouth daily. 08/03/22   [place], Peter M, MD  docusate sodium (COLACE) 100 MG capsule Take 100 mg by mouth as needed.    [provider]  doxylamine, Sleep, (UNISOM) 25 MG tablet Take 25 mg by mouth at bedtime as needed.    [provider]  Fluticasone-Umeclidin-Vilant (TRELEGY ELLIPTA) 100-62.5-25 MCG/ACT AEPB Inhale 1 puff into the lungs daily. 03/01/22   Hunsucker, Lesia Sago, MD  guaiFENesin (MUCINEX) 600 MG 12 hr tablet Take by mouth 2 (two) times daily as needed.    [provider]  metoprolol succinate (TOPROL-XL) 25 MG 24 hr tablet TAKE 1 TABLET EVERY DAY 10/12/22   [place], Peter M, MD  nitroGLYCERIN (NITROSTAT) 0.4 MG SL tablet Place  1 tablet (0.4 mg total) under the tongue as needed. 08/27/22   [place], Peter M, MD  Omega-3 Fatty Acids (FISH OIL) 500 MG CAPS Take by mouth.    [provider]  Polyethyl Glycol-Propyl Glycol (SYSTANE ULTRA OP) Apply 1 drop to eye 2 (two) times daily.    [provider]  Respiratory Therapy Supplies (FLUTTER) DEVI Use as directed 07/30/19   Nyoka Cowden, MD  Rivaroxaban (XARELTO) 15 MG TABS tablet Take 1 tablet (15 mg total) by mouth daily with supper. 05/06/22   [place], Peter M, MD  silver sulfADIAZINE (SILVADENE) 1 % cream Apply 1 Application topically daily as needed. 06/14/22   Hunsucker, Lesia Sago, MD  simvastatin (ZOCOR) 20 MG tablet Take 1 tablet (20 mg total)  by mouth daily at 6 PM. 08/03/22   [place], Peter M, MD  tamsulosin (FLOMAX) 0.4 MG CAPS capsule Take 0.4 mg by mouth daily after supper.    [provider]  VENTOLIN HFA 108 (90 Base) MCG/ACT inhaler INHALE 2 PUFFS BY MOUTH EVERY 4 HOURS AS NEEDED FOR WHEEZING FOR SHORTNESS OF BREATH 09/22/22   Hunsucker, Lesia Sago, MD    Allergies:   Allergies  Allergen Reactions   Viagra [Sildenafil]     Other reaction(s): blue haze   Iodinated Contrast Media Nausea And Vomiting and Rash    Social History:  reports that he quit smoking about 28 years ago. His smoking use included cigarettes. He started smoking about 69 years ago. He has a 30.00 pack-year smoking history. He has never used smokeless tobacco. He reports that he does not drink alcohol and does not use drugs.  Family History: Family History  Problem Relation Age of Onset   Heart attack Father    Hypertension Father    Stroke Mother     Physical Exam: Vitals:   12/05/22 0136 12/05/22 0156 12/05/22 0200 12/05/22 0206  BP:  (!) 141/96 (!) 154/82   Pulse:   (!) 36   Resp:      Temp: 98.2 F (36.8 C)   97.9 F (36.6 C)  TempSrc: Oral   Oral  SpO2:   99%   Weight:      Height:        General:  Alert and oriented times three, well developed and nourished, no acute distress Eyes: PERRLA, pink conjunctiva, no scleral icterus ENT: Moist oral mucosa, neck supple, no thyromegaly Lungs: clear to ascultation, no wheeze, no crackles, no use of accessory muscles Cardiovascular: regular rate and rhythm, no regurgitation, no gallops, no murmurs. No carotid bruits, no JVD Abdomen: soft, positive BS, non-tender, non-distended, no organomegaly, not an acute abdomen GU: not examined Neuro: CN II - XII grossly intact, sensation intact Musculoskeletal: strength 5/5 all extremities, no clubbing, cyanosis or edema Skin: no rash, no subcutaneous crepitation, no decubitus Psych: appropriate patient   Labs on Admission:  Recent Labs     12/04/22 2125  NA 134*  K 3.2*  CL 96*  CO2 25  GLUCOSE 226*  BUN 19  CREATININE 1.41*  CALCIUM 8.9    Recent Labs    12/04/22 2125  WBC 7.3  HGB 14.6  HCT 40.2  MCV 101.5*  PLT 164    Recent Labs    12/05/22 0024  HGBA1C 4.7*   Recent Labs    12/05/22 0024  CHOL 163  HDL 69  LDLCALC 80  TRIG 71  CHOLHDL 2.4    Radiological Exams on Admission: MR BRAIN WO CONTRAST  Result Date: 12/05/2022 CLINICAL DATA:  TIA EXAM: MRI HEAD WITHOUT CONTRAST MRA HEAD WITHOUT CONTRAST TECHNIQUE: Multiplanar, multi-echo pulse sequences of the brain and surrounding structures were acquired without intravenous contrast. Angiographic images of the Circle of Willis were acquired using MRA technique without intravenous contrast. COMPARISON:  No prior MRI available, correlation is made with CT head 12/04/2022 FINDINGS: MRI HEAD FINDINGS Brain: Small focus of restricted diffusion with ADC correlate in the posterior right frontal lobe white matter and cortex (series 5, images 82-84), measuring up to 6 x 9 x 4 mm (AP x TR x CC). No acute hemorrhage, mass, mass effect, or midline shift. No hydrocephalus or extra-axial collection. Normal pituitary and craniocervical junction. Punctate focus of hemosiderin deposition in the left temporal occipital region, likely sequela prior hypertensive microhemorrhage. No evidence of superficial siderosis. Advanced cerebral volume loss for age. Confluent and scattered T2 hyperintense signal in the periventricular white matter and pons, likely the sequela of mild-to-moderate chronic small vessel ischemic disease. Remote lacunar infarcts in the left cerebellar hemisphere. Dilated perivascular spaces in the basal ganglia. Vascular: Normal arterial flow voids. Skull and upper cervical spine: Normal marrow signal. Sinuses/Orbits: Air-fluid level in the right maxillary sinus. Mucosal thickening in the ethmoid air cells. Status post bilateral lens replacements. Other: The mastoids  are well aerated. MRA HEAD FINDINGS Anterior circulation: Both internal carotid arteries are patent to the termini, without significant stenosis. A1 segments patent. Normal anterior communicating artery. Anterior cerebral arteries are patent to their distal aspects. No M1 stenosis or occlusion. Normal MCA bifurcations. Distal MCA branches perfused and symmetric. Posterior circulation: Vertebral arteries patent to the vertebrobasilar junction without stenosis. Basilar patent to its distal aspect. Superior cerebellar arteries patent bilaterally. Patent P1 segments. PCAs perfused to their distal aspects without stenosis. The bilateral posterior communicating arteries are not visualized. Anatomic variants: None significant IMPRESSION: 1. Small acute infarct in the posterior right frontal lobe white matter and cortex, which measures up to 9 mm in greatest dimension. 2. No intracranial large vessel occlusion or significant stenosis. These results were called by telephone at the time of interpretation on 12/05/2022 at 12:22 am to provider [facility] , who verbally acknowledged these results. Electronically Signed   By: Wiliam Ke M.D.   On: 12/05/2022 00:23   MR ANGIO HEAD WO CONTRAST  Result Date: 12/05/2022 CLINICAL DATA:  TIA EXAM: MRI HEAD WITHOUT CONTRAST MRA HEAD WITHOUT CONTRAST TECHNIQUE: Multiplanar, multi-echo pulse sequences of the brain and surrounding structures were acquired without intravenous contrast. Angiographic images of the Circle of Willis were acquired using MRA technique without intravenous contrast. COMPARISON:  No prior MRI available, correlation is made with CT head 12/04/2022 FINDINGS: MRI HEAD FINDINGS Brain: Small focus of restricted diffusion with ADC correlate in the posterior right frontal lobe white matter and cortex (series 5, images 82-84), measuring up to 6 x 9 x 4 mm (AP x TR x CC). No acute hemorrhage, mass, mass effect, or midline shift. No hydrocephalus or extra-axial collection.  Normal pituitary and craniocervical junction. Punctate focus of hemosiderin deposition in the left temporal occipital region, likely sequela prior hypertensive microhemorrhage. No evidence of superficial siderosis. Advanced cerebral volume loss for age. Confluent and scattered T2 hyperintense signal in the periventricular white matter and pons, likely the sequela of mild-to-moderate chronic small vessel ischemic disease. Remote lacunar infarcts in the left cerebellar hemisphere. Dilated perivascular spaces in the basal ganglia. Vascular: Normal arterial flow voids. Skull and upper cervical spine: Normal marrow signal. Sinuses/Orbits: Air-fluid level in  the right maxillary sinus. Mucosal thickening in the ethmoid air cells. Status post bilateral lens replacements. Other: The mastoids are well aerated. MRA HEAD FINDINGS Anterior circulation: Both internal carotid arteries are patent to the termini, without significant stenosis. A1 segments patent. Normal anterior communicating artery. Anterior cerebral arteries are patent to their distal aspects. No M1 stenosis or occlusion. Normal MCA bifurcations. Distal MCA branches perfused and symmetric. Posterior circulation: Vertebral arteries patent to the vertebrobasilar junction without stenosis. Basilar patent to its distal aspect. Superior cerebellar arteries patent bilaterally. Patent P1 segments. PCAs perfused to their distal aspects without stenosis. The bilateral posterior communicating arteries are not visualized. Anatomic variants: None significant IMPRESSION: 1. Small acute infarct in the posterior right frontal lobe white matter and cortex, which measures up to 9 mm in greatest dimension. 2. No intracranial large vessel occlusion or significant stenosis. These results were called by telephone at the time of interpretation on 12/05/2022 at 12:22 am to provider [facility] , who verbally acknowledged these results. Electronically Signed   By: Wiliam Ke M.D.   On:  12/05/2022 00:23   CT HEAD WO CONTRAST  Result Date: 12/04/2022 CLINICAL DATA:  Transient ischemic attack (TIA) EXAM: CT HEAD WITHOUT CONTRAST TECHNIQUE: Contiguous axial images were obtained from the base of the skull through the vertex without intravenous contrast. RADIATION DOSE REDUCTION: This exam was performed according to the departmental dose-optimization program which includes automated exposure control, adjustment of the mA and/or kV according to patient size and/or use of iterative reconstruction technique. COMPARISON:  None Available. FINDINGS: Brain: Normal anatomic configuration. Parenchymal volume loss is commensurate with the patient's age. Mild periventricular white matter changes are present likely reflecting the sequela of small vessel ischemia. No abnormal intra or extra-axial mass lesion or fluid collection. No abnormal mass effect or midline shift. No evidence of acute intracranial hemorrhage or infarct. Ventricular size is normal. Cerebellum unremarkable. Vascular: No asymmetric hyperdense vasculature at the skull base. Skull: Intact Sinuses/Orbits: There are several opacified ethmoid air cells bilaterally as well as small layering fluid within the right maxillary sinus. Remaining paranasal sinuses are clear. Orbits are unremarkable. Other: Mastoid air cells and middle ear cavities are clear. IMPRESSION: 1. No acute intracranial hemorrhage or infarct. 2. Mild senescent change. 3. Mild paranasal sinus disease. Electronically Signed   By: Helyn Numbers M.D.   On: 12/04/2022 23:37   DG Chest 2 View  Result Date: 12/04/2022 CLINICAL DATA:  Weakness EXAM: CHEST - 2 VIEW COMPARISON:  [id number] FINDINGS: The lungs are stably hyperinflated in keeping with changes of underlying COPD. Nodular opacity at the right lung base may represent a confluence of vascular and osseous shadows, however, an underlying pulmonary nodule is difficult to exclude. The lungs are otherwise clear. No pneumothorax or  pleural effusion. Coronary artery bypass grafting has been performed. Cardiac size is within normal limits. Pulmonary vascularity is normal. No acute bone abnormality. IMPRESSION: 1. No radiographic evidence of acute cardiopulmonary disease. 2. COPD. 3. Possible right basilar pulmonary nodule. This could be further assessed with nonemergent CT imaging. Electronically Signed   By: Helyn Numbers M.D.   On: 12/04/2022 22:19    Assessment/Plan Present on Admission:  Acute CVA -CVA order set initiated -Neurochecks per the floor -Carotid ultrasound ordered -Permissive hypertension keep SBP less than 220, DBP less than 110 -Holding Xarelto per neurology recommendation.  To be resumed in a.m. -Patient with history of atrial fibrillation currently not in A-fib.  Could this be embolic CVA -Lipid panel ordered high-dose  statin initiated -PT/OT consult placed -Diet ordered as patient last swallow evaluation -Blood pressure medications on hold -2D echo not ordered per neurologist recommendation as it will not change the course of his treatment.   DM type 2, uncontrolled -Sliding scale insulin.  Avoid hypoglycemia   Hypokalemia -Repleting p.o., BMP in a.m.   AKI -Gentle IV fluid hydration.  BMP in a.m.  Possible pulmonary nodule -CT chest ordered to evaluate pulmonary nodule   COPD GOLD 4  -Follows with pulmonology -Resume azithromycin 3 times daily per pulmonary   Hypercholesteremia -High-dose statin   Hypertension -Permissive hypertension   CAD/Ischemic heart disease/atrial fibrillation -Xarelto, metoprolol, chlorthalidone, Norvasc on hold    Josaphine Shimamoto 12/05/2022, 2:27 AM

## 2022-12-06 ENCOUNTER — Telehealth: Payer: Self-pay | Admitting: Cardiology

## 2022-12-06 NOTE — Telephone Encounter (Signed)
Pt c/o medication issue:  1. Name of Medication: simvastatin (ZOCOR) 40 MG tablet   2. How are you currently taking this medication (dosage and times per day)? Take 1 tablet (40 mg total) by mouth daily at 6 PM   3. Are you having a reaction (difficulty breathing--STAT)? No 4. What is your medication issue?Patient's spouse is calling with questions about this medication

## 2022-12-06 NOTE — Telephone Encounter (Signed)
He/Pt had a stroke, he had a small lacuner in the right side- this happened on Saturday 5/4. Came home yesterday= Sunday 5/5; The Dr in hospital changed simvastatin to 40mg - Wanted to make sure with Dr [place] that this is ok? They have 20mg  tabs that they can double up for now.  But if it is ok to take 40mg  every evening; was wondering if you can send in prescription for that to [facility].   Also wondering if it is ok for pt to take All of his meds at dinner time- it would make things easier, instead of taking a few at dinner and the rest at bedtime.  Told that I would send this information to their provider and we will get back in touch with provider recommendations.

## 2022-12-07 ENCOUNTER — Ambulatory Visit: Payer: Medicare Other | Attending: Internal Medicine | Admitting: Physical Therapy

## 2022-12-07 DIAGNOSIS — M6281 Muscle weakness (generalized): Secondary | ICD-10-CM | POA: Diagnosis not present

## 2022-12-07 DIAGNOSIS — I69354 Hemiplegia and hemiparesis following cerebral infarction affecting left non-dominant side: Secondary | ICD-10-CM

## 2022-12-07 DIAGNOSIS — R2689 Other abnormalities of gait and mobility: Secondary | ICD-10-CM

## 2022-12-07 DIAGNOSIS — R2681 Unsteadiness on feet: Secondary | ICD-10-CM | POA: Diagnosis not present

## 2022-12-07 DIAGNOSIS — I639 Cerebral infarction, unspecified: Secondary | ICD-10-CM | POA: Insufficient documentation

## 2022-12-07 MED ORDER — SIMVASTATIN 40 MG PO TABS: 40.0000 mg | ORAL_TABLET | Freq: Every day | ORAL | 3 refills | Status: DC

## 2022-12-07 NOTE — Therapy (Unsigned)
OUTPATIENT PHYSICAL THERAPY NEURO EVALUATION   Patient Name: Larry Briggs MRN: [id number] DOB:23-Jun-1936, 87 y.o., male Today's Date: 12/08/2022   PCP: Merri Brunette, MD REFERRING PROVIDER: Lorin Glass, MD  END OF SESSION:  PT End of Session - 12/08/22 1829     Visit Number 1    Number of Visits 17    Date for PT Re-Evaluation 02/04/23   anticipate 6 week LOS   Authorization Type Medicare    Authorization Time Period 12-07-22 - 02-06-23    PT Start Time 1402    PT Stop Time 1450    PT Time Calculation (min) 48 min    Equipment Utilized During Treatment Gait belt    Activity Tolerance Patient tolerated treatment well    Behavior During Therapy WFL for tasks assessed/performed             Past Medical History:  Diagnosis Date   CAD (coronary artery disease)    Remote MI in 1980. CABG 1995. Stent to left main in 2004. Last cath in April of 2012. EF 50%; patent LIMA to DX/LAD with collateralization of the distal right, patent SVG to OM, occluded SVG to distal RCA, and patent stent to LAD   History of heart attack 1980   Hypercholesteremia    Hypertension    Increased glucose level    Obesity    Past Surgical History:  Procedure Laterality Date   CARDIAC CATHETERIZATION  1993   CARDIAC CATHETERIZATION  April 2012   Mild reduction if EF at 50%. Patent LIMA to DX/LAD with collateralization to distal RCA, patent SVG to OM and occluded SVG to distal right and patent stent to LAD/Left main;   CORONARY ARTERY BYPASS GRAFT  05/1994   x5 LIMA to LAD & DX, SVG to LCX, SVG to RCA   CORONARY STENT PLACEMENT  2004   Stent to the L main   INTRAMEDULLARY (IM) NAIL INTERTROCHANTERIC Left 09/05/2017   Procedure: INTRAMEDULLARY (IM) NAIL INTERTROCHANTRIC;  Surgeon: Sheral Apley, MD;  Location: [facility] OR;  Service: Orthopedics;  Laterality: Left;   Patient Active Problem List   Diagnosis Date Noted   CVA (cerebral vascular accident) (HCC) 12/05/2022   Pseudomonas aeruginosa  infection 11/20/2019   Abrasion 07/30/2019   Medication management 07/03/2019   Abnormal findings on diagnostic imaging of lung 03/02/2019   New onset atrial fibrillation (HCC)    Hypokalemia    Acute blood loss anemia    Hip fracture (HCC) 09/04/2017   Cough 04/22/2016   COPD GOLD 4  04/22/2016   T wave inversion in EKG 05/10/2011   Hypertension    Arteriosclerotic cardiovascular disease (ASCVD)    Hypercholesteremia    Obesity    Ischemic heart disease    Increased glucose level     ONSET DATE: 12-04-22  REFERRING DIAG:  Diagnosis  I63.9 (ICD-10-CM) - Cerebrovascular accident (CVA), unspecified mechanism (HCC)    THERAPY DIAG:  Hemiplegia and hemiparesis following cerebral infarction affecting left non-dominant side (HCC)  Other abnormalities of gait and mobility  Muscle weakness (generalized)  Unsteadiness on feet  Rationale for Evaluation and Treatment: Rehabilitation  SUBJECTIVE:  SUBJECTIVE STATEMENT: Pt reports he went to ED on 12-04-22 with Lt sided weakness - states his left leg "wasn't working quite right and I didn't feel good"; talked to his sister in [place] and told her his symptoms and she recommended that he go to ED and get checked out; pt was in ED for 17 hours - diagnosed with CVA; he presents with LLE weakness and mild decreased coordination in LUE; wife reports pt is dragging his Lt leg when he goes down the steps at his home Pt accompanied by:  wife, [name]  PERTINENT HISTORY:   87 year old male with past medical history significant for hypertension, CAD, A-fib on Xarelto presents today for evaluation of weakness on the left side that started upon waking up this morning around 9 AM.  He states he went to bed last night feeling normal.  He states that initially started  with decreased grip strength on the left, and then he noticed he was unable to walk normally throughout the day.  He feels that the grip strength has resolved.  Denies any difficulty with speech, facial droop, vision issues, or balance issues.  He states he feels weaker on the left particularly in his left leg.  Denies history of CVA.;  h/o fracture in Lt femur in Feb. 2019 - was putting on pants, leg got caught in his pants and he lost his balance and fell  PAIN:  Are you having pain? No  PRECAUTIONS: Fall  WEIGHT BEARING RESTRICTIONS: No  FALLS: Has patient fallen in last 6 months? No  LIVING ENVIRONMENT: Lives with: lives with their spouse Lives in: House/apartment Stairs: Yes: External: 3 steps; on right going up Has following equipment at home: Single point cane  PLOF: Independent with basic ADLs, Independent with household mobility with device, Independent with community mobility with device, Independent with transfers, and Requires assistive device for independence  PATIENT GOALS: "be back close to where I was"; get Lt leg stronger  OBJECTIVE:   DIAGNOSTIC FINDINGS:       EXAM: MRI HEAD WITHOUT CONTRAST   MRA HEAD WITHOUT CONTRAST     IMPRESSION: 1. Small acute infarct in the posterior right frontal lobe white matter and cortex, which measures up to 9 mm in greatest dimension. 2. No intracranial large vessel occlusion or significant stenosis.    COGNITION: Overall cognitive status: Within functional limits for tasks assessed   SENSATION: WFL  COORDINATION: WFL's for LLE; impaired LUE - pt is scheduled for OT evaluation   POSTURE: rounded shoulders, forward head, increased thoracic kyphosis, and flexed trunk   LOWER EXTREMITY ROM:   WFL's bil. LE's   LOWER EXTREMITY MMT:    MMT Right Eval Left Eval  Hip flexion  4-  Hip extension  3+  Hip abduction  3+  Hip adduction    Hip internal rotation    Hip external rotation    Knee flexion    Knee  extension    Ankle dorsiflexion  3+  Ankle plantarflexion    Ankle inversion    Ankle eversion    (Blank rows = not tested)  BED MOBILITY:  Independent  TRANSFERS: Assistive device utilized: Single point cane  Sit to stand: Modified independence Stand to sit: Modified independence  RAMP: TBA  CURB:  TBA  STAIRS:  TBA  GAIT: Gait pattern: step through pattern, decreased step length- Left, decreased hip/knee flexion- Left, decreased ankle dorsiflexion- Left, and lateral lean- Right Distance walked: 107' Assistive device utilized: Single point cane  RW has been recommended for increased safety/decreased fall risk Level of assistance: SBA Comments: Pt would benefit from RW for increased stability & safety with ambulation  FUNCTIONAL TESTS:  5 times sit to stand: TBA Timed up and go (TUG): 22.50 secs with SPC 10 meter walk test: 19.06 secs with SPC = 1.72 ft/sec with SPC   PATIENT SURVEYS:  FOTO Stroke LE score 56/100 with risk adjusted 60/100  TODAY'S TREATMENT:                                                                                                                              DATE: 12-07-22   Access Code: [email or web address] URL: [email or web address] Date: 12/08/2022 Prepared by: Maebelle Munroe  Exercises - Sidelying Hip Abduction  - 1 x daily - 7 x weekly - 1 sets - 10 reps - Active Straight Leg Raise with Quad Set  - 1 x daily - 7 x weekly - 1 sets - 10 reps - Standing Hip Extension  - 1 x daily - 7 x weekly - 3 sets - 10 reps - Seated Ankle Dorsiflexion with Resistance  - 1 x daily - 7 x weekly - 3 sets - 10 reps (with red theraband)   PATIENT EDUCATION: Education details: HEP issued - [employer] [phone] Person educated: Patient and Spouse Education method: Explanation, Demonstration, and Handouts Education comprehension: verbalized understanding and returned demonstration  HOME EXERCISE PROGRAM: See above  GOALS: Goals reviewed with patient?  Yes  SHORT TERM GOALS: Target date: 01-07-23  Pt will be independent in HEP for LLE strengthening. Baseline: Goal status: INITIAL  2.  Improve TUG score to </= 16 secs with SPC to demo improved mobility and decreased fall risk. Baseline: 22.50 secs with SPC Goal status: INITIAL  3.  Amb. 69' with RW with CGA for increased safety with community ambulation. Baseline:  Goal status: INITIAL  4.  Pt will subjectively report increased ease with getting out of car Baseline:  Goal status: INITIAL  5.  Increase gait velocity to >/= 2.0 ft/sec with SPC for increased gait efficiency. Baseline: 19.06  secs = 1.72 ft/sec Goal status: INITIAL  6.  Complete Berg balance test Baseline:  Goal status: INITIAL  LONG TERM GOALS: Target date: 02-04-23  Improve Berg balance test score by at least 5 points to decrease fall risk. Baseline: TBA  Goal status: INITIAL  2.  Pt will amb. 350' with RW for increased community accessibility and for increased safety with amb. Baseline: pt using SPC - amb. Approx. 88' with unsteady gait pattern Goal status: INITIAL  3.  Pt will negotiate 4 steps with use of Rt hand rail with SBA using step over step sequence.  Baseline: TBA Goal status: INITIAL  4.  Amb. Short household distances, I.e. 20', without use of device modified independently.  Baseline: pt using SPC Goal status: INITIAL  5.  Increase FOTO score to >/= 72/100 to demo improvement in functional status. Baseline: 56/100  Stroke LE Goal status: INITIAL  6.  Independent in updated HEP for LLE strengthening and balance. Baseline:  Goal status: INITIAL  ASSESSMENT:  CLINICAL IMPRESSION: Patient is a 87 y.o. gentleman who was seen today for physical therapy evaluation and treatment for LLE weakness and gait impairments due to CVA sustained on 12-04-22.  Pt presents with LLE weakness with unsteady gait due to Lt hip musc. Weakness and decreased dorsiflexion Lt foot in swing phase of gait.  Pt would  benefit from RW to increase safety with ambulation.  Pt is at risk for fall per TUG score of 22.5 secs.  PMH includes Lt femur fracture in 2019 and atrial fibrillation.  Pt will benefit from PT to address gait and balance deficits and LLE weakness.    OBJECTIVE IMPAIRMENTS: Abnormal gait, cardiopulmonary status limiting activity, decreased balance, decreased endurance, decreased knowledge of use of DME, and decreased strength.   ACTIVITY LIMITATIONS: carrying, lifting, bending, standing, squatting, stairs, transfers, and locomotion level  PARTICIPATION LIMITATIONS: meal prep, cleaning, shopping, community activity, and church  PERSONAL FACTORS: Age, Past/current experiences, and 1-2 comorbidities: a-fib and h/o Lt femur fracture  are also affecting patient's functional outcome.   REHAB POTENTIAL: Good  CLINICAL DECISION MAKING: Evolving/moderate complexity  EVALUATION COMPLEXITY: Moderate  PLAN:  PT FREQUENCY: 2x/week  PT DURATION: 8 weeks (anticipate 6 week LOS)  PLANNED INTERVENTIONS: Therapeutic exercises, Therapeutic activity, Neuromuscular re-education, Balance training, Gait training, Patient/Family education, Self Care, Stair training, Orthotic/Fit training, and DME instructions  PLAN FOR NEXT SESSION: check HEP issued; do Berg test; continue LLE strengthening; step negotiation   Kary Kos, PT 12/08/2022, 7:41 PM

## 2022-12-07 NOTE — Telephone Encounter (Signed)
Yes I agree with increase in dose to 40 mg. Can send Rx in please  Peter Swaz[place] MD, [facility]   Spoke with the patient and gave him Dr Elvis Coil message. Rx sent to [facility] for Simvastatin 40 mg. Pt states that he is doing well and verbalized understanding.

## 2022-12-08 ENCOUNTER — Encounter: Payer: Self-pay | Admitting: Physical Therapy

## 2022-12-13 ENCOUNTER — Ambulatory Visit (INDEPENDENT_AMBULATORY_CARE_PROVIDER_SITE_OTHER): Payer: Medicare Other | Admitting: Pulmonary Disease

## 2022-12-13 ENCOUNTER — Encounter: Payer: Self-pay | Admitting: Pulmonary Disease

## 2022-12-13 VITALS — BP 110/60 | HR 87 | Ht 70.0 in | Wt 155.6 lb

## 2022-12-13 DIAGNOSIS — J449 Chronic obstructive pulmonary disease, unspecified: Secondary | ICD-10-CM

## 2022-12-13 NOTE — Patient Instructions (Signed)
Nice to see you again  No changes to medications  Keep working with the therapists - I know it will help  Return to clinic in 6 months or sooner as needed

## 2022-12-13 NOTE — Progress Notes (Signed)
@Patient  ID: Larry Briggs, male    DOB: Jul 17, 1936, 87 y.o.   MRN: [id number]  Chief Complaint  Patient presents with   Follow-up    6 months f/up.    Referring provider: Merri Brunette, MD  HPI:   Larry Briggs is a 87 y.o. man with past medical history of tobacco abuse now in remission and severe (FEV1 30%) COPD here for follow-up.  Most recent PCP note reviewed.  Most recent discharge summary 12/2022 reviewed.  H&P reviewed.  Overall he is doing well.  No exacerbations since last visit.  None in the last 6 months.   Reports good adherence to Trelegy as well as azithromycin Monday Wednesday Friday.  Unfortunately, he was hospitalized earlier this month with acute neurologic change, left-sided weakness arm and leg.  Diagnosed with acute stroke on imaging.  Breathing seems to be stable.  He is already get stronger over the last couple weeks.  Working with physical therapy.  Occupational Therapy delayed due to lack of availability.  Starting soon.  Note some worsening just therapy on his left hand but overall strength improving.  Questionaires / Pulmonary Flowsheets:   ACT:      No data to display           MMRC:     No data to display           Epworth:      No data to display           Tests:   FENO:  Lab Results  Component Value Date   NITRICOXIDE 30 06/01/2018    PFT:    Latest Ref Rng & Units 04/26/2016    8:35 AM  PFT Results  FVC-Pre L 2.20   FVC-Predicted Pre % 53   FVC-Post L 2.41   FVC-Predicted Post % 58   Pre FEV1/FVC % % 40   Post FEV1/FCV % % 37   FEV1-Pre L 0.89   FEV1-Predicted Pre % 30   FEV1-Post L 0.90   DLCO uncorrected ml/min/mmHg 13.54   DLCO UNC% % 41   DLCO corrected ml/min/mmHg 14.48   DLCO COR %Predicted % 44   DLVA Predicted % 53   TLC L 8.20   TLC % Predicted % 116   RV % Predicted % 191    Severe COPD with severely reduced DLCO on my interpretation.   WALK:      No data to display            Imaging: MR BRAIN WO CONTRAST  Result Date: 12/05/2022 CLINICAL DATA:  TIA EXAM: MRI HEAD WITHOUT CONTRAST MRA HEAD WITHOUT CONTRAST TECHNIQUE: Multiplanar, multi-echo pulse sequences of the brain and surrounding structures were acquired without intravenous contrast. Angiographic images of the Circle of Willis were acquired using MRA technique without intravenous contrast. COMPARISON:  No prior MRI available, correlation is made with CT head 12/04/2022 FINDINGS: MRI HEAD FINDINGS Brain: Small focus of restricted diffusion with ADC correlate in the posterior right frontal lobe white matter and cortex (series 5, images 82-84), measuring up to 6 x 9 x 4 mm (AP x TR x CC). No acute hemorrhage, mass, mass effect, or midline shift. No hydrocephalus or extra-axial collection. Normal pituitary and craniocervical junction. Punctate focus of hemosiderin deposition in the left temporal occipital region, likely sequela prior hypertensive microhemorrhage. No evidence of superficial siderosis. Advanced cerebral volume loss for age. Confluent and scattered T2 hyperintense signal in the periventricular white matter and pons, likely the sequela  of mild-to-moderate chronic small vessel ischemic disease. Remote lacunar infarcts in the left cerebellar hemisphere. Dilated perivascular spaces in the basal ganglia. Vascular: Normal arterial flow voids. Skull and upper cervical spine: Normal marrow signal. Sinuses/Orbits: Air-fluid level in the right maxillary sinus. Mucosal thickening in the ethmoid air cells. Status post bilateral lens replacements. Other: The mastoids are well aerated. MRA HEAD FINDINGS Anterior circulation: Both internal carotid arteries are patent to the termini, without significant stenosis. A1 segments patent. Normal anterior communicating artery. Anterior cerebral arteries are patent to their distal aspects. No M1 stenosis or occlusion. Normal MCA bifurcations. Distal MCA branches perfused and symmetric.  Posterior circulation: Vertebral arteries patent to the vertebrobasilar junction without stenosis. Basilar patent to its distal aspect. Superior cerebellar arteries patent bilaterally. Patent P1 segments. PCAs perfused to their distal aspects without stenosis. The bilateral posterior communicating arteries are not visualized. Anatomic variants: None significant IMPRESSION: 1. Small acute infarct in the posterior right frontal lobe white matter and cortex, which measures up to 9 mm in greatest dimension. 2. No intracranial large vessel occlusion or significant stenosis. These results were called by telephone at the time of interpretation on 12/05/2022 at 12:22 am to provider Larry Briggs , who verbally acknowledged these results. Electronically Signed   By: Wiliam Ke M.D.   On: 12/05/2022 00:23   MR ANGIO HEAD WO CONTRAST  Result Date: 12/05/2022 CLINICAL DATA:  TIA EXAM: MRI HEAD WITHOUT CONTRAST MRA HEAD WITHOUT CONTRAST TECHNIQUE: Multiplanar, multi-echo pulse sequences of the brain and surrounding structures were acquired without intravenous contrast. Angiographic images of the Circle of Willis were acquired using MRA technique without intravenous contrast. COMPARISON:  No prior MRI available, correlation is made with CT head 12/04/2022 FINDINGS: MRI HEAD FINDINGS Brain: Small focus of restricted diffusion with ADC correlate in the posterior right frontal lobe white matter and cortex (series 5, images 82-84), measuring up to 6 x 9 x 4 mm (AP x TR x CC). No acute hemorrhage, mass, mass effect, or midline shift. No hydrocephalus or extra-axial collection. Normal pituitary and craniocervical junction. Punctate focus of hemosiderin deposition in the left temporal occipital region, likely sequela prior hypertensive microhemorrhage. No evidence of superficial siderosis. Advanced cerebral volume loss for age. Confluent and scattered T2 hyperintense signal in the periventricular white matter and pons, likely the sequela  of mild-to-moderate chronic small vessel ischemic disease. Remote lacunar infarcts in the left cerebellar hemisphere. Dilated perivascular spaces in the basal ganglia. Vascular: Normal arterial flow voids. Skull and upper cervical spine: Normal marrow signal. Sinuses/Orbits: Air-fluid level in the right maxillary sinus. Mucosal thickening in the ethmoid air cells. Status post bilateral lens replacements. Other: The mastoids are well aerated. MRA HEAD FINDINGS Anterior circulation: Both internal carotid arteries are patent to the termini, without significant stenosis. A1 segments patent. Normal anterior communicating artery. Anterior cerebral arteries are patent to their distal aspects. No M1 stenosis or occlusion. Normal MCA bifurcations. Distal MCA branches perfused and symmetric. Posterior circulation: Vertebral arteries patent to the vertebrobasilar junction without stenosis. Basilar patent to its distal aspect. Superior cerebellar arteries patent bilaterally. Patent P1 segments. PCAs perfused to their distal aspects without stenosis. The bilateral posterior communicating arteries are not visualized. Anatomic variants: None significant IMPRESSION: 1. Small acute infarct in the posterior right frontal lobe white matter and cortex, which measures up to 9 mm in greatest dimension. 2. No intracranial large vessel occlusion or significant stenosis. These results were called by telephone at the time of interpretation on 12/05/2022 at 12:22 am  to provider [facility] , who verbally acknowledged these results. Electronically Signed   By: Wiliam Ke M.D.   On: 12/05/2022 00:23   CT HEAD WO CONTRAST  Result Date: 12/04/2022 CLINICAL DATA:  Transient ischemic attack (TIA) EXAM: CT HEAD WITHOUT CONTRAST TECHNIQUE: Contiguous axial images were obtained from the base of the skull through the vertex without intravenous contrast. RADIATION DOSE REDUCTION: This exam was performed according to the departmental dose-optimization  program which includes automated exposure control, adjustment of the mA and/or kV according to patient size and/or use of iterative reconstruction technique. COMPARISON:  None Available. FINDINGS: Brain: Normal anatomic configuration. Parenchymal volume loss is commensurate with the patient's age. Mild periventricular white matter changes are present likely reflecting the sequela of small vessel ischemia. No abnormal intra or extra-axial mass lesion or fluid collection. No abnormal mass effect or midline shift. No evidence of acute intracranial hemorrhage or infarct. Ventricular size is normal. Cerebellum unremarkable. Vascular: No asymmetric hyperdense vasculature at the skull base. Skull: Intact Sinuses/Orbits: There are several opacified ethmoid air cells bilaterally as well as small layering fluid within the right maxillary sinus. Remaining paranasal sinuses are clear. Orbits are unremarkable. Other: Mastoid air cells and middle ear cavities are clear. IMPRESSION: 1. No acute intracranial hemorrhage or infarct. 2. Mild senescent change. 3. Mild paranasal sinus disease. Electronically Signed   By: Helyn Numbers M.D.   On: 12/04/2022 23:37   DG Chest 2 View  Result Date: 12/04/2022 CLINICAL DATA:  Weakness EXAM: CHEST - 2 VIEW COMPARISON:  [id number] FINDINGS: The lungs are stably hyperinflated in keeping with changes of underlying COPD. Nodular opacity at the right lung base may represent a confluence of vascular and osseous shadows, however, an underlying pulmonary nodule is difficult to exclude. The lungs are otherwise clear. No pneumothorax or pleural effusion. Coronary artery bypass grafting has been performed. Cardiac size is within normal limits. Pulmonary vascularity is normal. No acute bone abnormality. IMPRESSION: 1. No radiographic evidence of acute cardiopulmonary disease. 2. COPD. 3. Possible right basilar pulmonary nodule. This could be further assessed with nonemergent CT imaging. Electronically  Signed   By: Helyn Numbers M.D.   On: 12/04/2022 22:19    Lab Results: Personally reviewed, no history of eosinophilia CBC    Component Value Date/Time   WBC 7.4 12/05/2022 0240   RBC 3.65 (L) 12/05/2022 0240   HGB 13.4 12/05/2022 0240   HGB 14.6 07/10/2020 1423   HCT 36.9 (L) 12/05/2022 0240   HCT 41.3 07/10/2020 1423   PLT 189 12/05/2022 0240   PLT 205 07/10/2020 1423   MCV 101.1 (H) 12/05/2022 0240   MCV 97 07/10/2020 1423   MCH 36.7 (H) 12/05/2022 0240   MCHC 36.3 (H) 12/05/2022 0240   RDW 12.4 12/05/2022 0240   RDW 13.8 07/10/2020 1423   LYMPHSABS 1.0 12/05/2022 0240   LYMPHSABS 1.2 07/10/2020 1423   MONOABS 0.5 12/05/2022 0240   EOSABS 0.1 12/05/2022 0240   EOSABS 0.2 07/10/2020 1423   BASOSABS 0.0 12/05/2022 0240   BASOSABS 0.0 07/10/2020 1423    BMET    Component Value Date/Time   NA 135 12/05/2022 0240   NA 139 07/10/2020 1423   K 3.0 (L) 12/05/2022 0240   CL 98 12/05/2022 0240   CO2 25 12/05/2022 0240   GLUCOSE 129 (H) 12/05/2022 0240   BUN 18 12/05/2022 0240   BUN 14 07/10/2020 1423   CREATININE 1.19 12/05/2022 0240   CREATININE 0.86 03/09/2016 0926   CALCIUM 8.7 (L)  12/05/2022 0240   GFRNONAA >60 12/05/2022 0240   GFRAA 77 07/10/2020 1423    BNP No results found for: "BNP"  ProBNP No results found for: "PROBNP"  Specialty Problems       Pulmonary Problems   COPD GOLD 4     Quit smoking 1995 - Spirometry  04/26/16   FEV1 0.90 (30%)  Ratio 0.37  No resp to saba p ? Prior to study - 07/30/2019  After extensive coaching inhaler device,  effectiveness =    75% (short Ti)        Cough    Allergies  Allergen Reactions   Viagra [Sildenafil]     Other reaction(s): blue haze   Iodinated Contrast Media Nausea And Vomiting and Rash    Immunization History  Administered Date(s) Administered   Fluad Quad(high Dose 65+) 05/18/2019, 05/22/2021   Influenza Inj Mdck Quad Pf 04/20/2018   Influenza Split 04/16/2016   Influenza, High Dose Seasonal  PF 05/17/2014, 05/16/2015, 05/02/2020, 05/14/2022   Influenza, Quadrivalent, Recombinant, Inj, Pf 05/16/2017, 05/01/2018, 05/11/2019, 05/02/2020   Influenza-Unspecified 05/26/2012, 04/20/2013   Moderna Sars-Cov-2 Peds vaccine 47yrs thru 22yrs 05/31/2022   PFIZER(Purple Top)SARS-COV-2 Vaccination 08/23/2019, 09/13/2019, 05/07/2020, 02/09/2021   Pneumococcal Conjugate-13 06/14/2014   Zoster Recombinat (Shingrix) 05/24/2018, 08/17/2018   Zoster, Live 06/07/2006    Past Medical History:  Diagnosis Date   CAD (coronary artery disease)    Remote MI in 1980. CABG 1995. Stent to left main in 2004. Last cath in April of 2012. EF 50%; patent LIMA to DX/LAD with collateralization of the distal right, patent SVG to OM, occluded SVG to distal RCA, and patent stent to LAD   History of heart attack 1980   Hypercholesteremia    Hypertension    Increased glucose level    Obesity     Tobacco History: Social History   Tobacco Use  Smoking Status Former   Packs/day: 0.75   Years: 40.00   Additional pack years: 0.00   Total pack years: 30.00   Types: Cigarettes   Start date: 50   Quit date: 07/02/1994   Years since quitting: 28.4  Smokeless Tobacco Never   Counseling given: Not Answered   Continue to not smoke  Outpatient Encounter Medications as of 12/13/2022  Medication Sig   acetaminophen (TYLENOL) 650 MG CR tablet Take 650 mg by mouth every 8 (eight) hours as needed.   amLODipine (NORVASC) 5 MG tablet TAKE 1 TABLET EVERY DAY   azithromycin (ZITHROMAX) 250 MG tablet TAKE 1 TABLET 3  TIMES A WEEK ON MONDAY, WEDNESDAY, AND FRIDAY.   Bioflavonoid Products (ESTER-C) 500-550 MG TABS Take 500 mg by mouth daily.   chlorthalidone (HYGROTON) 25 MG tablet Take 1 tablet (25 mg total) by mouth daily.   cyanocobalamin 1000 MCG tablet Take 1 tablet (1,000 mcg total) by mouth daily.   docusate sodium (COLACE) 100 MG capsule Take 100 mg by mouth as needed.   doxylamine, Sleep, (UNISOM) 25 MG tablet  Take 25 mg by mouth at bedtime as needed.   Fluticasone-Umeclidin-Vilant (TRELEGY ELLIPTA) 100-62.5-25 MCG/ACT AEPB Inhale 1 puff into the lungs daily.   guaiFENesin (MUCINEX) 600 MG 12 hr tablet Take by mouth 2 (two) times daily as needed.   metoprolol succinate (TOPROL-XL) 25 MG 24 hr tablet TAKE 1 TABLET EVERY DAY   nitroGLYCERIN (NITROSTAT) 0.4 MG SL tablet Place 1 tablet (0.4 mg total) under the tongue as needed.   Omega-3 Fatty Acids (FISH OIL) 500 MG CAPS Take by mouth.  Polyethyl Glycol-Propyl Glycol (SYSTANE ULTRA OP) Apply 1 drop to eye 2 (two) times daily.   Respiratory Therapy Supplies (FLUTTER) DEVI Use as directed   Rivaroxaban (XARELTO) 15 MG TABS tablet Take 1 tablet (15 mg total) by mouth daily with supper.   silver sulfADIAZINE (SILVADENE) 1 % cream Apply 1 Application topically daily as needed.   simvastatin (ZOCOR) 40 MG tablet Take 1 tablet (40 mg total) by mouth daily at 6 PM.   tamsulosin (FLOMAX) 0.4 MG CAPS capsule Take 0.4 mg by mouth daily after supper.   VENTOLIN HFA 108 (90 Base) MCG/ACT inhaler INHALE 2 PUFFS BY MOUTH EVERY 4 HOURS AS NEEDED FOR WHEEZING FOR SHORTNESS OF BREATH   No facility-administered encounter medications on file as of 12/13/2022.      Physical Exam  BP 110/60 (BP Location: Left Arm)   Pulse 87   Ht 5\' 10"  (1.778 m)   Wt 155 lb 9.6 oz (70.6 kg)   SpO2 96%   BMI 22.33 kg/m   Wt Readings from Last 5 Encounters:  12/13/22 155 lb 9.6 oz (70.6 kg)  12/04/22 161 lb 6 oz (73.2 kg)  08/27/22 161 lb 6.4 oz (73.2 kg)  06/14/22 161 lb 6.4 oz (73.2 kg)  03/01/22 163 lb (73.9 kg)    BMI Readings from Last 5 Encounters:  12/13/22 22.33 kg/m  12/04/22 23.16 kg/m  08/27/22 23.16 kg/m  06/14/22 23.16 kg/m  03/01/22 23.39 kg/m     Physical Exam General: Sitting up in exam chair, no acute distress Eyes: EOMI, no icterus Respiratory: Distant sounds bilaterally, no wheeze or crackle Cardiovascular: Warm, regular rate Extremities:  No lower extremity edema, warm   Assessment & Plan:   Dyspnea on exertion: Likely largely driven by severe COPD.  Stable.  Encourage increased exertional capacity as tolerated.  Severe FEV1 30% COPD, Gold category D: Prior frequent exacerbated.  Azithromycin low-dose Monday Wednesday Friday added 11/2020.  No further exacerbations in interim.  To continue this.  To continue Trelegy.  Recent chest imaging, chest x-ray, clear.    Return in about 6 months (around 06/15/2023).   Karren Burly, MD 12/13/2022

## 2022-12-14 ENCOUNTER — Ambulatory Visit: Payer: Medicare Other | Admitting: Physical Therapy

## 2022-12-14 DIAGNOSIS — I639 Cerebral infarction, unspecified: Secondary | ICD-10-CM | POA: Diagnosis not present

## 2022-12-14 DIAGNOSIS — M6281 Muscle weakness (generalized): Secondary | ICD-10-CM | POA: Diagnosis not present

## 2022-12-14 DIAGNOSIS — R2689 Other abnormalities of gait and mobility: Secondary | ICD-10-CM

## 2022-12-14 DIAGNOSIS — R2681 Unsteadiness on feet: Secondary | ICD-10-CM

## 2022-12-14 DIAGNOSIS — I69354 Hemiplegia and hemiparesis following cerebral infarction affecting left non-dominant side: Secondary | ICD-10-CM | POA: Diagnosis not present

## 2022-12-14 NOTE — Therapy (Unsigned)
OUTPATIENT PHYSICAL THERAPY NEURO TREATMENT NOTE   Patient Name: Larry Briggs MRN: [id number] DOB:March 16, 1936, 87 y.o., male Today's Date: 12/15/2022   PCP: Merri Brunette, MD REFERRING PROVIDER: Lorin Glass, MD  END OF SESSION:  PT End of Session - 12/15/22 1838     Visit Number 2    Number of Visits 17    Date for PT Re-Evaluation 02/04/23   anticipate 6 week LOS   Authorization Type Medicare    Authorization Time Period 12-07-22 - 02-06-23    PT Start Time 1447    PT Stop Time 1530    PT Time Calculation (min) 43 min    Equipment Utilized During Treatment Gait belt    Activity Tolerance Patient tolerated treatment well    Behavior During Therapy WFL for tasks assessed/performed              Past Medical History:  Diagnosis Date   CAD (coronary artery disease)    Remote MI in 1980. CABG 1995. Stent to left main in 2004. Last cath in April of 2012. EF 50%; patent LIMA to DX/LAD with collateralization of the distal right, patent SVG to OM, occluded SVG to distal RCA, and patent stent to LAD   History of heart attack 1980   Hypercholesteremia    Hypertension    Increased glucose level    Obesity    Past Surgical History:  Procedure Laterality Date   CARDIAC CATHETERIZATION  1993   CARDIAC CATHETERIZATION  April 2012   Mild reduction if EF at 50%. Patent LIMA to DX/LAD with collateralization to distal RCA, patent SVG to OM and occluded SVG to distal right and patent stent to LAD/Left main;   CORONARY ARTERY BYPASS GRAFT  05/1994   x5 LIMA to LAD & DX, SVG to LCX, SVG to RCA   CORONARY STENT PLACEMENT  2004   Stent to the L main   INTRAMEDULLARY (IM) NAIL INTERTROCHANTERIC Left 09/05/2017   Procedure: INTRAMEDULLARY (IM) NAIL INTERTROCHANTRIC;  Surgeon: Sheral Apley, MD;  Location: [facility] OR;  Service: Orthopedics;  Laterality: Left;   Patient Active Problem List   Diagnosis Date Noted   CVA (cerebral vascular accident) (HCC) 12/05/2022   Pseudomonas aeruginosa  infection 11/20/2019   Abrasion 07/30/2019   Medication management 07/03/2019   Abnormal findings on diagnostic imaging of lung 03/02/2019   New onset atrial fibrillation (HCC)    Hypokalemia    Acute blood loss anemia    Hip fracture (HCC) 09/04/2017   Cough 04/22/2016   COPD GOLD 4  04/22/2016   T wave inversion in EKG 05/10/2011   Hypertension    Arteriosclerotic cardiovascular disease (ASCVD)    Hypercholesteremia    Obesity    Ischemic heart disease    Increased glucose level     ONSET DATE: 12-04-22  REFERRING DIAG:  Diagnosis  I63.9 (ICD-10-CM) - Cerebrovascular accident (CVA), unspecified mechanism (HCC)    THERAPY DIAG:  Muscle weakness (generalized)  Other abnormalities of gait and mobility  Unsteadiness on feet  Rationale for Evaluation and Treatment: Rehabilitation  SUBJECTIVE:  SUBJECTIVE STATEMENT: Pt using rollator for assistance with ambulation to today's session- wife states pt uses this device in the home at times, but also still using cane some also (in home only); rollator noted to be too short for patient; PT adjusted rollator higher for pt for improved upright posture Pt accompanied by:  wife, [name]  PERTINENT HISTORY:   87 year old male with past medical history significant for hypertension, CAD, A-fib on Xarelto presents today for evaluation of weakness on the left side that started upon waking up this morning around 9 AM.  He states he went to bed last night feeling normal.  He states that initially started with decreased grip strength on the left, and then he noticed he was unable to walk normally throughout the day.  He feels that the grip strength has resolved.  Denies any difficulty with speech, facial droop, vision issues, or balance issues.  He states he feels  weaker on the left particularly in his left leg.  Denies history of CVA.;  h/o fracture in Lt femur in Feb. 2019 - was putting on pants, leg got caught in his pants and he lost his balance and fell  PAIN:  Are you having pain? No  PRECAUTIONS: Fall  WEIGHT BEARING RESTRICTIONS: No  FALLS: Has patient fallen in last 6 months? No  LIVING ENVIRONMENT: Lives with: lives with their spouse Lives in: House/apartment Stairs: Yes: External: 3 steps; on right going up Has following equipment at home: Single point cane  PLOF: Independent with basic ADLs, Independent with household mobility with device, Independent with community mobility with device, Independent with transfers, and Requires assistive device for independence  PATIENT GOALS: "be back close to where I was"; get Lt leg stronger   TODAY's TREATMENT:  12-14-22   GAIT: Gait pattern: step through pattern, decreased step length- Left, decreased hip/knee flexion- Left, decreased ankle dorsiflexion- Left, and lateral lean- Right Distance walked: 54' with rollator in 3" walk test  Assistive device utilized: rollator Level of assistance: SBA Comments: Adjusted rollator 2 notches higher for pt for improved upright standing posture; pt reported LLE fatigued and "heavy" in final  31' of 3" walk test (pt able to continue walking and did not require seated rest period until test completed)   NEURORE-ED:  12/14/22 0001  Berg Balance Test  Sit to Stand 3  Standing Unsupported 4  Sitting with Back Unsupported but Feet Supported on Floor or Stool 4  Stand to Sit 4  Transfers 3  Standing Unsupported with Eyes Closed 3  Standing Unsupported with Feet Together 3  From Standing, Reach Forward with Outstretched Arm 4  From Standing Position, Pick up Object from Floor 3  From Standing Position, Turn to Look Behind Over each Shoulder 2  Turn 360 Degrees 2  Standing Unsupported, Alternately Place Feet on Step/Stool 1 (5.07 secs to Lt side)   Standing Unsupported, One Foot in Front 2  Standing on One Leg 1  Total Score 39   THEREX: LLE SLR 10 reps with 2# Lt hip flexion/extension in hooklying position with 2# (knee flexed during exercise) Lt hip flexion (heel slide) then hip and knee extended with control - 10 reps - no weight used Lt hip extension control exercise off side of mat with 2# 10 reps Lt hip abduction in Rt sidelying position - 2 sets 10 reps with 2# weight  In standing position - Lt hip extension 10 reps - no weight used Pt performed stepping over and back of lower  height orange hurdle with RUE support on counter 5 reps due to fatigue of LLE                                                                                                                             DATE: 12-07-22 (HEP instruction during eval session)  Access Code: [email or web address] URL: [email or web address] Date: 12/08/2022 Prepared by: Maebelle Munroe  Exercises - Sidelying Hip Abduction  - 1 x daily - 7 x weekly - 1 sets - 10 reps - Active Straight Leg Raise with Quad Set  - 1 x daily - 7 x weekly - 1 sets - 10 reps - Standing Hip Extension  - 1 x daily - 7 x weekly - 3 sets - 10 reps - Seated Ankle Dorsiflexion with Resistance  - 1 x daily - 7 x weekly - 3 sets - 10 reps (with red theraband)   PATIENT EDUCATION: Education details: HEP issued - Medbridge [id number]; increased # reps to 3 sets of 10 for sidelying hip abduction and SLR; recommended that weight of 2# be obtained for PRE's if possible Person educated: Patient and Spouse Education method: Explanation, Demonstration, and Handouts Education comprehension: verbalized understanding and returned demonstration  HOME EXERCISE PROGRAM: See above  GOALS: Goals reviewed with patient? Yes  SHORT TERM GOALS: Target date: 01-07-23  Pt will be independent in HEP for LLE strengthening. Baseline: Goal status: INITIAL  2.  Improve TUG score to </= 16 secs with SPC to demo improved  mobility and decreased fall risk. Baseline: 22.50 secs with SPC Goal status: INITIAL  3.  Amb. 42' with RW with CGA for increased safety with community ambulation. Baseline:  Goal status: INITIAL  4.  Pt will subjectively report increased ease with getting out of car Baseline:  Goal status: INITIAL  5.  Increase gait velocity to >/= 2.0 ft/sec with SPC for increased gait efficiency. Baseline: 19.06  secs = 1.72 ft/sec Goal status: INITIAL  6.  Complete Berg balance test Baseline:  Goal status: INITIAL  LONG TERM GOALS: Target date: 02-04-23  Improve Berg balance test score by at least 5 points to decrease fall risk. Baseline: TBA  Goal status: INITIAL  2.  Pt will amb. 350' with RW for increased community accessibility and for increased safety with amb. Baseline: pt using SPC - amb. Approx. 41' with unsteady gait pattern Goal status: INITIAL  3.  Pt will negotiate 4 steps with use of Rt hand rail with SBA using step over step sequence.  Baseline: TBA Goal status: INITIAL  4.  Amb. Short household distances, I.e. 20', without use of device modified independently.  Baseline: pt using SPC Goal status: INITIAL  5.  Increase FOTO score to >/= 72/100 to demo improvement in functional status. Baseline: 56/100 Stroke LE Goal status: INITIAL  6.  Independent in updated HEP for LLE strengthening and balance. Baseline:  Goal status: INITIAL  ASSESSMENT:  CLINICAL IMPRESSION: PT session focused on balance  assessment with pt scoring 39/56 on Berg balance test (pt is at risk of fall as score <45/56).  Session also focused on endurance training with pt ambulating 3" nonstop with use of rollator for distance of 315'.  Pt did report fatigue at end of this test.  Remainder of session focused on LLE strengthening exercises with 2# weight used for PRE's with focus on Lt hip flexor strengthening.  Pt reported fatigue at end of session.    OBJECTIVE IMPAIRMENTS: Abnormal gait,  cardiopulmonary status limiting activity, decreased balance, decreased endurance, decreased knowledge of use of DME, and decreased strength.   ACTIVITY LIMITATIONS: carrying, lifting, bending, standing, squatting, stairs, transfers, and locomotion level  PARTICIPATION LIMITATIONS: meal prep, cleaning, shopping, community activity, and church  PERSONAL FACTORS: Age, Past/current experiences, and 1-2 comorbidities: a-fib and h/o Lt femur fracture  are also affecting patient's functional outcome.   REHAB POTENTIAL: Good  CLINICAL DECISION MAKING: Evolving/moderate complexity  EVALUATION COMPLEXITY: Moderate  PLAN:  PT FREQUENCY: 2x/week  PT DURATION: 8 weeks (anticipate 6 week LOS)  PLANNED INTERVENTIONS: Therapeutic exercises, Therapeutic activity, Neuromuscular re-education, Balance training, Gait training, Patient/Family education, Self Care, Stair training, Orthotic/Fit training, and DME instructions  PLAN FOR NEXT SESSION: try standing PRE's with red theraband for hip strengthening: continue LLE strengthening; step negotiation   Kary Kos, PT 12/15/2022, 6:40 PM

## 2022-12-15 ENCOUNTER — Encounter: Payer: Self-pay | Admitting: Physical Therapy

## 2022-12-15 NOTE — Progress Notes (Signed)
   12/14/22 0001  Berg Balance Test  Sit to Stand 3  Standing Unsupported 4  Sitting with Back Unsupported but Feet Supported on Floor or Stool 4  Stand to Sit 4  Transfers 3  Standing Unsupported with Eyes Closed 3  Standing Unsupported with Feet Together 3  From Standing, Reach Forward with Outstretched Arm 4  From Standing Position, Pick up Object from Floor 3  From Standing Position, Turn to Look Behind Over each Shoulder 2  Turn 360 Degrees 2  Standing Unsupported, Alternately Place Feet on Step/Stool 1 (5.07 secs to Lt side)  Standing Unsupported, One Foot in Front 2  Standing on One Leg 1  Total Score 39

## 2022-12-16 ENCOUNTER — Ambulatory Visit: Payer: Medicare Other | Admitting: Physical Therapy

## 2022-12-16 DIAGNOSIS — M6281 Muscle weakness (generalized): Secondary | ICD-10-CM

## 2022-12-16 DIAGNOSIS — R2681 Unsteadiness on feet: Secondary | ICD-10-CM

## 2022-12-16 DIAGNOSIS — R2689 Other abnormalities of gait and mobility: Secondary | ICD-10-CM | POA: Diagnosis not present

## 2022-12-16 DIAGNOSIS — I639 Cerebral infarction, unspecified: Secondary | ICD-10-CM | POA: Diagnosis not present

## 2022-12-16 DIAGNOSIS — I69354 Hemiplegia and hemiparesis following cerebral infarction affecting left non-dominant side: Secondary | ICD-10-CM | POA: Diagnosis not present

## 2022-12-17 ENCOUNTER — Encounter: Payer: Self-pay | Admitting: Physical Therapy

## 2022-12-17 NOTE — Therapy (Signed)
OUTPATIENT PHYSICAL THERAPY NEURO TREATMENT NOTE   Patient Name: Larry Briggs MRN: [id number] DOB:06/06/1936, 87 y.o., male Today's Date: 12/22/2022   PCP: Pharr, Walter, MD REFERRING PROVIDER: Dahal, Binaya, MD  END OF SESSION:  PT End of Session - 12/22/22 1703     Visit Number 4    Number of Visits 17    Date for PT Re-Evaluation 02/04/23   anticipate 6 week LOS   Authorization Type Medicare    Authorization Time Period 12-07-22 - 02-06-23    PT Start Time 1520    PT Stop Time 1610    PT Time Calculation (min) 50 min    Equipment Utilized During Treatment Gait belt    Activity Tolerance Patient tolerated treatment well    Behavior During Therapy WFL for tasks assessed/performed              Past Medical History:  Diagnosis Date   CAD (coronary artery disease)    Remote MI in 1980. CABG 1995. Stent to left main in 2004. Last cath in April of 2012. EF 50%; patent LIMA to DX/LAD with collateralization of the distal right, patent SVG to OM, occluded SVG to distal RCA, and patent stent to LAD   History of heart attack 1980   Hypercholesteremia    Hypertension    Increased glucose level    Obesity    Past Surgical History:  Procedure Laterality Date   CARDIAC CATHETERIZATION  1993   CARDIAC CATHETERIZATION  April 2012   Mild reduction if EF at 50%. Patent LIMA to DX/LAD with collateralization to distal RCA, patent SVG to OM and occluded SVG to distal right and patent stent to LAD/Left main;   CORONARY ARTERY BYPASS GRAFT  05/1994   x5 LIMA to LAD & DX, SVG to LCX, SVG to RCA   CORONARY STENT PLACEMENT  2004   Stent to the L main   INTRAMEDULLARY (IM) NAIL INTERTROCHANTERIC Left 09/05/2017   Procedure: INTRAMEDULLARY (IM) NAIL INTERTROCHANTRIC;  Surgeon: Murphy, Timothy D, MD;  Location: [facility] OR;  Service: Orthopedics;  Laterality: Left;   Patient Active Problem List   Diagnosis Date Noted   CVA (cerebral vascular accident) (HCC) 12/05/2022   Pseudomonas aeruginosa  infection 11/20/2019   Abrasion 07/30/2019   Medication management 07/03/2019   Abnormal findings on diagnostic imaging of lung 03/02/2019   New onset atrial fibrillation (HCC)    Hypokalemia    Acute blood loss anemia    Hip fracture (HCC) 09/04/2017   Cough 04/22/2016   COPD GOLD 4  04/22/2016   T wave inversion in EKG 05/10/2011   Hypertension    Arteriosclerotic cardiovascular disease (ASCVD)    Hypercholesteremia    Obesity    Ischemic heart disease    Increased glucose level     ONSET DATE: 12-04-22  REFERRING DIAG:  Diagnosis  I63.9 (ICD-10-CM) - Cerebrovascular accident (CVA), unspecified mechanism (HCC)    THERAPY DIAG:  Muscle weakness (generalized)  Other abnormalities of gait and mobility  Unsteadiness on feet  Rationale for Evaluation and Treatment: Rehabilitation  SUBJECTIVE:                                                                                                                                                                                               SUBJECTIVE STATEMENT: Pt reports he has obtained 2#  weights to use with his exercises - wife ordered them online for him.  Wife asks if pt would be able to use 2# dumbbell for Lt arm strengthening exercise - replied yes and would show pt shoulder strengthening exercises  Pt accompanied by:  wife, Susan  PERTINENT HISTORY:   87-year-old male with past medical history significant for hypertension, CAD, A-fib on Xarelto presents today for evaluation of weakness on the left side that started upon waking up this morning around 9 AM.  He states he went to bed last night feeling normal.  He states that initially started with decreased grip strength on the left, and then he noticed he was unable to walk normally throughout the day.  He feels that the grip strength has resolved.  Denies any difficulty with speech, facial droop, vision issues, or balance issues.  He states he feels weaker on the left particularly in  his left leg.  Denies history of CVA.;  h/o fracture in Lt femur in Feb. 2019 - was putting on pants, leg got caught in his pants and he lost his balance and fell  PAIN:  Are you having pain? No  PRECAUTIONS: Fall  WEIGHT BEARING RESTRICTIONS: No  FALLS: Has patient fallen in last 6 months? No  LIVING ENVIRONMENT: Lives with: lives with their spouse Lives in: House/apartment Stairs: Yes: External: 3 steps; on right going up Has following equipment at home: Single point cane  PLOF: Independent with basic ADLs, Independent with household mobility with device, Independent with community mobility with device, Independent with transfers, and Requires assistive device for independence  PATIENT GOALS: "be back close to where I was"; get Lt leg stronger   TODAY's TREATMENT:  12-21-22   GAIT: Gait pattern: step through pattern, decreased step length- Left, decreased hip/knee flexion- Left, decreased ankle dorsiflexion- Left, and lateral lean- Right Distance walked:  clinic distances with use of rollator; pt gait trained inside // bars forward (10') x 2 reps; backwards 10' x 1 rep - no UE support used but CGA for safety Assistive device utilized: rollator Level of assistance: SBA with rollator;  CGA inside // bars without UE support Comments: see above     NEURORE-ED:  Pt performed SLS exercise to improve SLS on each leg - alternating tap ups to 1st step (6") 5 reps; then tap ups to 2nd step 5 reps alternating;  then tap ups with LLE only to 3rd step for Lt hip flexor strengthening:  pt used Rt handrail for UE support for assist with balance  Pt performed marching in place 10 reps each leg - standing inside // bars but no UE support used Pt performed stepping over and back of black balance beam 5 reps LLE with UE support for Lt hip flexor & extensor strengthening   THEREX:   Sit to stand from mat with RUE support 5 reps Bridging x 3 reps - bil. LE's flexed; Bridging with RLE  extension/flexion for Lt hip extensor strengthening  Lt hip flexion (heel slide) then hip and knee extended with control - 10 reps - 2# weight used Lt clam shell exercise in Rt sidelying - 2# weight used - 10 reps LLE step up exercise onto 6" step 10 reps with bil. UE support on rails - for Lt quad and hip extensor strengthening Lt dorsiflexion with green theraband - seated position 10 reps with 3 sec hold (wife requested to increase resistance as she reported the red   theraband was getting too easy for Pt)  Shoulder flexion LUE 10 reps with 2# dumbbell; shoulder abduction with 2# dumbbell and Lt elbow flexion/extension with 2# 10 reps each - added to HEP per pt's request for LUE strengthening exercises  SciFit level 2.6 x 4" with LE's only; no rest break taken   UPDATED HEP (12-21-22):  Medbridge Access Code: [email or web address] URL: [email or web address] Date: 12/22/2022 Prepared by: Verl Whitmore  Exercises - Sit to Stand with Counter Support  - 1 x daily - 7 x weekly - 1 sets - 10 reps - Supine Bridge with Leg Extension  - 1 x daily - 7 x weekly - 1 sets - 10 reps - Supine Heel Slide  - 1 x daily - 7 x weekly - 1 sets - 10 reps - Clamshell with Resistance  - 1 x daily - 7 x weekly - 1 sets - 10 reps - 5 sec hold - Seated Shoulder Flexion with Dumbbells  - 1 x daily - 7 x weekly - 1 sets - 10 reps - Seated Shoulder Abduction with Dumbbells - Thumbs Up  - 1 x daily - 7 x weekly - 1 sets - 10 reps - Seated Single Arm Bicep Curls Supinated with Dumbbell  - 1 x daily - 7 x weekly - 1 sets - 10 reps                                                                                                                            DATE: 12-07-22 (HEP instruction during eval session)  Access Code: [email or web address] URL: [email or web address] Date: 12/08/2022 Prepared by: Amanada Philbrick  Exercises - Sidelying Hip Abduction  - 1 x daily - 7 x weekly - 1 sets - 10 reps - Active Straight Leg Raise  with Quad Set  - 1 x daily - 7 x weekly - 1 sets - 10 reps - Standing Hip Extension  - 1 x daily - 7 x weekly - 3 sets - 10 reps - Seated Ankle Dorsiflexion with Resistance  - 1 x daily - 7 x weekly - 3 sets - 10 reps (with red theraband)   PATIENT EDUCATION: Education details: HEP issued - Medbridge [id number]; increased # reps to 3 sets of 10 for sidelying hip abduction and SLR; recommended that weight of 2# be obtained for PRE's if possible Person educated: Patient and Spouse Education method: Explanation, Demonstration, and Handouts Education comprehension: verbalized understanding and returned demonstration  HOME EXERCISE PROGRAM: See above  GOALS: Goals reviewed with patient? Yes  SHORT TERM GOALS: Target date: 01-07-23  Pt will be independent in HEP for LLE strengthening. Baseline: Goal status: INITIAL  2.  Improve TUG score to </= 16 secs with SPC to demo improved mobility and decreased fall risk. Baseline: 22.50 secs with SPC Goal status: INITIAL  3.  Amb. 230' with RW with CGA for increased safety with community ambulation. Baseline:  Goal status: INITIAL  4.  Pt will subjectively   report increased ease with getting out of car Baseline:  Goal status: INITIAL  5.  Increase gait velocity to >/= 2.0 ft/sec with SPC for increased gait efficiency. Baseline: 19.06  secs = 1.72 ft/sec Goal status: INITIAL  6.  Complete Berg balance test Baseline:  Goal status: INITIAL  LONG TERM GOALS: Target date: 02-04-23  Improve Berg balance test score by at least 5 points to decrease fall risk. Baseline: TBA  Goal status: INITIAL  2.  Pt will amb. 350' with RW for increased community accessibility and for increased safety with amb. Baseline: pt using SPC - amb. Approx. 40' with unsteady gait pattern Goal status: INITIAL  3.  Pt will negotiate 4 steps with use of Rt hand rail with SBA using step over step sequence.  Baseline: TBA Goal status: INITIAL  4.  Amb. Short household  distances, I.e. 20', without use of device modified independently.  Baseline: pt using SPC Goal status: INITIAL  5.  Increase FOTO score to >/= 72/100 to demo improvement in functional status. Baseline: 56/100 Stroke LE Goal status: INITIAL  6.  Independent in updated HEP for LLE strengthening and balance. Baseline:  Goal status: INITIAL  ASSESSMENT:  CLINICAL IMPRESSION: PT session focused on LLE strengthening exercises with additional exercises issued for HEP and also on Lt shoulder strengthening exercises per pt's and wife's request.  Session also focused on LLE SLS exercises; pt able to perform marching without UE support on // bars.  Pt demonstrated increased activity tolerance/endurance by increasing time on SciFit to 4" nonstop compared to 2:15" performed in previous session with need for rest break due to fatigue.  Pt does continue to require frequent short seated rest breaks with standing exercises during session.  Cont with POC.    OBJECTIVE IMPAIRMENTS: Abnormal gait, cardiopulmonary status limiting activity, decreased balance, decreased endurance, decreased knowledge of use of DME, and decreased strength.   ACTIVITY LIMITATIONS: carrying, lifting, bending, standing, squatting, stairs, transfers, and locomotion level  PARTICIPATION LIMITATIONS: meal prep, cleaning, shopping, community activity, and church  PERSONAL FACTORS: Age, Past/current experiences, and 1-2 comorbidities: a-fib and h/o Lt femur fracture  are also affecting patient's functional outcome.   REHAB POTENTIAL: Good  CLINICAL DECISION MAKING: Evolving/moderate complexity  EVALUATION COMPLEXITY: Moderate  PLAN:  PT FREQUENCY: 2x/week  PT DURATION: 8 weeks (anticipate 6 week LOS)  PLANNED INTERVENTIONS: Therapeutic exercises, Therapeutic activity, Neuromuscular re-education, Balance training, Gait training, Patient/Family education, Self Care, Stair training, Orthotic/Fit training, and DME  instructions  PLAN FOR NEXT SESSION: continue LLE strengthening and SLS activities   Brigitte Soderberg Suzanne, PT 12/22/2022, 5:06 PM        

## 2022-12-20 DIAGNOSIS — Z7901 Long term (current) use of anticoagulants: Secondary | ICD-10-CM | POA: Diagnosis not present

## 2022-12-20 DIAGNOSIS — Z09 Encounter for follow-up examination after completed treatment for conditions other than malignant neoplasm: Secondary | ICD-10-CM | POA: Diagnosis not present

## 2022-12-20 DIAGNOSIS — E538 Deficiency of other specified B group vitamins: Secondary | ICD-10-CM | POA: Diagnosis not present

## 2022-12-20 DIAGNOSIS — I639 Cerebral infarction, unspecified: Secondary | ICD-10-CM | POA: Diagnosis not present

## 2022-12-20 DIAGNOSIS — R7989 Other specified abnormal findings of blood chemistry: Secondary | ICD-10-CM | POA: Diagnosis not present

## 2022-12-20 DIAGNOSIS — E876 Hypokalemia: Secondary | ICD-10-CM | POA: Diagnosis not present

## 2022-12-21 ENCOUNTER — Ambulatory Visit: Payer: Medicare Other | Admitting: Physical Therapy

## 2022-12-21 DIAGNOSIS — R2681 Unsteadiness on feet: Secondary | ICD-10-CM

## 2022-12-21 DIAGNOSIS — M6281 Muscle weakness (generalized): Secondary | ICD-10-CM | POA: Diagnosis not present

## 2022-12-21 DIAGNOSIS — I69354 Hemiplegia and hemiparesis following cerebral infarction affecting left non-dominant side: Secondary | ICD-10-CM | POA: Diagnosis not present

## 2022-12-21 DIAGNOSIS — R2689 Other abnormalities of gait and mobility: Secondary | ICD-10-CM

## 2022-12-21 DIAGNOSIS — I639 Cerebral infarction, unspecified: Secondary | ICD-10-CM | POA: Diagnosis not present

## 2022-12-21 NOTE — Therapy (Signed)
OUTPATIENT PHYSICAL THERAPY NEURO TREATMENT NOTE   Patient Name: Larry Briggs MRN: [id number] DOB:May 15, 1936, 87 y.o., male Today's Date: 12/22/2022   PCP: Merri Brunette, MD REFERRING PROVIDER: Lorin Glass, MD  END OF SESSION:  PT End of Session - 12/22/22 1703     Visit Number 4    Number of Visits 17    Date for PT Re-Evaluation 02/04/23   anticipate 6 week LOS   Authorization Type Medicare    Authorization Time Period 12-07-22 - 02-06-23    PT Start Time 1520    PT Stop Time 1610    PT Time Calculation (min) 50 min    Equipment Utilized During Treatment Gait belt    Activity Tolerance Patient tolerated treatment well    Behavior During Therapy WFL for tasks assessed/performed              Past Medical History:  Diagnosis Date   CAD (coronary artery disease)    Remote MI in 1980. CABG 1995. Stent to left main in 2004. Last cath in April of 2012. EF 50%; patent LIMA to DX/LAD with collateralization of the distal right, patent SVG to OM, occluded SVG to distal RCA, and patent stent to LAD   History of heart attack 1980   Hypercholesteremia    Hypertension    Increased glucose level    Obesity    Past Surgical History:  Procedure Laterality Date   CARDIAC CATHETERIZATION  1993   CARDIAC CATHETERIZATION  April 2012   Mild reduction if EF at 50%. Patent LIMA to DX/LAD with collateralization to distal RCA, patent SVG to OM and occluded SVG to distal right and patent stent to LAD/Left main;   CORONARY ARTERY BYPASS GRAFT  05/1994   x5 LIMA to LAD & DX, SVG to LCX, SVG to RCA   CORONARY STENT PLACEMENT  2004   Stent to the L main   INTRAMEDULLARY (IM) NAIL INTERTROCHANTERIC Left 09/05/2017   Procedure: INTRAMEDULLARY (IM) NAIL INTERTROCHANTRIC;  Surgeon: Sheral Apley, MD;  Location: [facility] OR;  Service: Orthopedics;  Laterality: Left;   Patient Active Problem List   Diagnosis Date Noted   CVA (cerebral vascular accident) (HCC) 12/05/2022   Pseudomonas aeruginosa  infection 11/20/2019   Abrasion 07/30/2019   Medication management 07/03/2019   Abnormal findings on diagnostic imaging of lung 03/02/2019   New onset atrial fibrillation (HCC)    Hypokalemia    Acute blood loss anemia    Hip fracture (HCC) 09/04/2017   Cough 04/22/2016   COPD GOLD 4  04/22/2016   T wave inversion in EKG 05/10/2011   Hypertension    Arteriosclerotic cardiovascular disease (ASCVD)    Hypercholesteremia    Obesity    Ischemic heart disease    Increased glucose level     ONSET DATE: 12-04-22  REFERRING DIAG:  Diagnosis  I63.9 (ICD-10-CM) - Cerebrovascular accident (CVA), unspecified mechanism (HCC)    THERAPY DIAG:  Muscle weakness (generalized)  Other abnormalities of gait and mobility  Unsteadiness on feet  Rationale for Evaluation and Treatment: Rehabilitation  SUBJECTIVE:  SUBJECTIVE STATEMENT: Pt reports he has obtained 2#  weights to use with his exercises - wife ordered them online for him.  Wife asks if pt would be able to use 2# dumbbell for Lt arm strengthening exercise - replied yes and would show pt shoulder strengthening exercises  Pt accompanied by:  wife, [name]  PERTINENT HISTORY:   87 year old male with past medical history significant for hypertension, CAD, A-fib on Xarelto presents today for evaluation of weakness on the left side that started upon waking up this morning around 9 AM.  He states he went to bed last night feeling normal.  He states that initially started with decreased grip strength on the left, and then he noticed he was unable to walk normally throughout the day.  He feels that the grip strength has resolved.  Denies any difficulty with speech, facial droop, vision issues, or balance issues.  He states he feels weaker on the left particularly in  his left leg.  Denies history of CVA.;  h/o fracture in Lt femur in Feb. 2019 - was putting on pants, leg got caught in his pants and he lost his balance and fell  PAIN:  Are you having pain? No  PRECAUTIONS: Fall  WEIGHT BEARING RESTRICTIONS: No  FALLS: Has patient fallen in last 6 months? No  LIVING ENVIRONMENT: Lives with: lives with their spouse Lives in: House/apartment Stairs: Yes: External: 3 steps; on right going up Has following equipment at home: Single point cane  PLOF: Independent with basic ADLs, Independent with household mobility with device, Independent with community mobility with device, Independent with transfers, and Requires assistive device for independence  PATIENT GOALS: "be back close to where I was"; get Lt leg stronger   TODAY's TREATMENT:  12-21-22   GAIT: Gait pattern: step through pattern, decreased step length- Left, decreased hip/knee flexion- Left, decreased ankle dorsiflexion- Left, and lateral lean- Right Distance walked:  clinic distances with use of rollator; pt gait trained inside // bars forward (10') x 2 reps; backwards 10' x 1 rep - no UE support used but CGA for safety Assistive device utilized: rollator Level of assistance: SBA with rollator;  CGA inside // bars without UE support Comments: see above     NEURORE-ED:  Pt performed SLS exercise to improve SLS on each leg - alternating tap ups to 1st step (6") 5 reps; then tap ups to 2nd step 5 reps alternating;  then tap ups with LLE only to 3rd step for Lt hip flexor strengthening:  pt used Rt handrail for UE support for assist with balance  Pt performed marching in place 10 reps each leg - standing inside // bars but no UE support used Pt performed stepping over and back of black balance beam 5 reps LLE with UE support for Lt hip flexor & extensor strengthening   THEREX:   Sit to stand from mat with RUE support 5 reps Bridging x 3 reps - bil. LE's flexed; Bridging with RLE  extension/flexion for Lt hip extensor strengthening  Lt hip flexion (heel slide) then hip and knee extended with control - 10 reps - 2# weight used Lt clam shell exercise in Rt sidelying - 2# weight used - 10 reps LLE step up exercise onto 6" step 10 reps with bil. UE support on rails - for Lt quad and hip extensor strengthening Lt dorsiflexion with green theraband - seated position 10 reps with 3 sec hold (wife requested to increase resistance as she reported the red  theraband was getting too easy for Pt)  Shoulder flexion LUE 10 reps with 2# dumbbell; shoulder abduction with 2# dumbbell and Lt elbow flexion/extension with 2# 10 reps each - added to HEP per pt's request for LUE strengthening exercises  SciFit level 2.6 x 4" with LE's only; no rest break taken   UPDATED HEP (12-21-22):  Medbridge Access Code: [email or web address] URL: [email or web address] Date: 12/22/2022 Prepared by: Maebelle Munroe  Exercises - Sit to Stand with Counter Support  - 1 x daily - 7 x weekly - 1 sets - 10 reps - Supine Bridge with Leg Extension  - 1 x daily - 7 x weekly - 1 sets - 10 reps - Supine Heel Slide  - 1 x daily - 7 x weekly - 1 sets - 10 reps - Clamshell with Resistance  - 1 x daily - 7 x weekly - 1 sets - 10 reps - 5 sec hold - Seated Shoulder Flexion with Dumbbells  - 1 x daily - 7 x weekly - 1 sets - 10 reps - Seated Shoulder Abduction with Dumbbells - Thumbs Up  - 1 x daily - 7 x weekly - 1 sets - 10 reps - Seated Single Arm Bicep Curls Supinated with Dumbbell  - 1 x daily - 7 x weekly - 1 sets - 10 reps                                                                                                                            DATE: 12-07-22 (HEP instruction during eval session)  Access Code: [email or web address] URL: [email or web address] Date: 12/08/2022 Prepared by: Maebelle Munroe  Exercises - Sidelying Hip Abduction  - 1 x daily - 7 x weekly - 1 sets - 10 reps - Active Straight Leg Raise  with Quad Set  - 1 x daily - 7 x weekly - 1 sets - 10 reps - Standing Hip Extension  - 1 x daily - 7 x weekly - 3 sets - 10 reps - Seated Ankle Dorsiflexion with Resistance  - 1 x daily - 7 x weekly - 3 sets - 10 reps (with red theraband)   PATIENT EDUCATION: Education details: HEP issued - Medbridge [id number]; increased # reps to 3 sets of 10 for sidelying hip abduction and SLR; recommended that weight of 2# be obtained for PRE's if possible Person educated: Patient and Spouse Education method: Explanation, Demonstration, and Handouts Education comprehension: verbalized understanding and returned demonstration  HOME EXERCISE PROGRAM: See above  GOALS: Goals reviewed with patient? Yes  SHORT TERM GOALS: Target date: 01-07-23  Pt will be independent in HEP for LLE strengthening. Baseline: Goal status: INITIAL  2.  Improve TUG score to </= 16 secs with SPC to demo improved mobility and decreased fall risk. Baseline: 22.50 secs with SPC Goal status: INITIAL  3.  Amb. 54' with RW with CGA for increased safety with community ambulation. Baseline:  Goal status: INITIAL  4.  Pt will subjectively  report increased ease with getting out of car Baseline:  Goal status: INITIAL  5.  Increase gait velocity to >/= 2.0 ft/sec with SPC for increased gait efficiency. Baseline: 19.06  secs = 1.72 ft/sec Goal status: INITIAL  6.  Complete Berg balance test Baseline:  Goal status: INITIAL  LONG TERM GOALS: Target date: 02-04-23  Improve Berg balance test score by at least 5 points to decrease fall risk. Baseline: TBA  Goal status: INITIAL  2.  Pt will amb. 350' with RW for increased community accessibility and for increased safety with amb. Baseline: pt using SPC - amb. Approx. 59' with unsteady gait pattern Goal status: INITIAL  3.  Pt will negotiate 4 steps with use of Rt hand rail with SBA using step over step sequence.  Baseline: TBA Goal status: INITIAL  4.  Amb. Short household  distances, I.e. 20', without use of device modified independently.  Baseline: pt using SPC Goal status: INITIAL  5.  Increase FOTO score to >/= 72/100 to demo improvement in functional status. Baseline: 56/100 Stroke LE Goal status: INITIAL  6.  Independent in updated HEP for LLE strengthening and balance. Baseline:  Goal status: INITIAL  ASSESSMENT:  CLINICAL IMPRESSION: PT session focused on LLE strengthening exercises with additional exercises issued for HEP and also on Lt shoulder strengthening exercises per pt's and wife's request.  Session also focused on LLE SLS exercises; pt able to perform marching without UE support on // bars.  Pt demonstrated increased activity tolerance/endurance by increasing time on SciFit to 4" nonstop compared to 2:15" performed in previous session with need for rest break due to fatigue.  Pt does continue to require frequent short seated rest breaks with standing exercises during session.  Cont with POC.    OBJECTIVE IMPAIRMENTS: Abnormal gait, cardiopulmonary status limiting activity, decreased balance, decreased endurance, decreased knowledge of use of DME, and decreased strength.   ACTIVITY LIMITATIONS: carrying, lifting, bending, standing, squatting, stairs, transfers, and locomotion level  PARTICIPATION LIMITATIONS: meal prep, cleaning, shopping, community activity, and church  PERSONAL FACTORS: Age, Past/current experiences, and 1-2 comorbidities: a-fib and h/o Lt femur fracture  are also affecting patient's functional outcome.   REHAB POTENTIAL: Good  CLINICAL DECISION MAKING: Evolving/moderate complexity  EVALUATION COMPLEXITY: Moderate  PLAN:  PT FREQUENCY: 2x/week  PT DURATION: 8 weeks (anticipate 6 week LOS)  PLANNED INTERVENTIONS: Therapeutic exercises, Therapeutic activity, Neuromuscular re-education, Balance training, Gait training, Patient/Family education, Self Care, Stair training, Orthotic/Fit training, and DME  instructions  PLAN FOR NEXT SESSION: continue LLE strengthening and SLS activities   Kary Kos, PT 12/22/2022, 5:06 PM

## 2022-12-22 ENCOUNTER — Encounter: Payer: Self-pay | Admitting: Physical Therapy

## 2022-12-23 ENCOUNTER — Ambulatory Visit: Payer: Medicare Other | Admitting: Physical Therapy

## 2022-12-23 ENCOUNTER — Encounter: Payer: Self-pay | Admitting: Physical Therapy

## 2022-12-23 DIAGNOSIS — R2689 Other abnormalities of gait and mobility: Secondary | ICD-10-CM

## 2022-12-23 DIAGNOSIS — R2681 Unsteadiness on feet: Secondary | ICD-10-CM | POA: Diagnosis not present

## 2022-12-23 DIAGNOSIS — M6281 Muscle weakness (generalized): Secondary | ICD-10-CM | POA: Diagnosis not present

## 2022-12-23 DIAGNOSIS — I69354 Hemiplegia and hemiparesis following cerebral infarction affecting left non-dominant side: Secondary | ICD-10-CM | POA: Diagnosis not present

## 2022-12-23 DIAGNOSIS — I639 Cerebral infarction, unspecified: Secondary | ICD-10-CM | POA: Diagnosis not present

## 2022-12-23 NOTE — Therapy (Signed)
OUTPATIENT PHYSICAL THERAPY NEURO TREATMENT NOTE   Patient Name: Larry Briggs MRN: [id number] DOB:01-21-1936, 87 y.o., male Today's Date: 12/23/2022   PCP: Larry Brunette, MD REFERRING PROVIDER: Lorin Glass, MD  END OF SESSION:  PT End of Session - 12/23/22 1942     Visit Number 5    Number of Visits 17    Date for PT Re-Evaluation 02/04/23   anticipate 6 week LOS   Authorization Type Medicare    Authorization Time Period 12-07-22 - 02-06-23    PT Start Time 1401    PT Stop Time 1445    PT Time Calculation (min) 44 min    Equipment Utilized During Treatment Gait belt   SPC with rubber quad base   Activity Tolerance Patient tolerated treatment well    Behavior During Therapy WFL for tasks assessed/performed              Past Medical History:  Diagnosis Date   CAD (coronary artery disease)    Remote MI in 1980. CABG 1995. Stent to left main in 2004. Last cath in April of 2012. EF 50%; patent LIMA to DX/LAD with collateralization of the distal right, patent SVG to OM, occluded SVG to distal RCA, and patent stent to LAD   History of heart attack 1980   Hypercholesteremia    Hypertension    Increased glucose level    Obesity    Past Surgical History:  Procedure Laterality Date   CARDIAC CATHETERIZATION  1993   CARDIAC CATHETERIZATION  April 2012   Mild reduction if EF at 50%. Patent LIMA to DX/LAD with collateralization to distal RCA, patent SVG to OM and occluded SVG to distal right and patent stent to LAD/Left main;   CORONARY ARTERY BYPASS GRAFT  05/1994   x5 LIMA to LAD & DX, SVG to LCX, SVG to RCA   CORONARY STENT PLACEMENT  2004   Stent to the L main   INTRAMEDULLARY (IM) NAIL INTERTROCHANTERIC Left 09/05/2017   Procedure: INTRAMEDULLARY (IM) NAIL INTERTROCHANTRIC;  Surgeon: Larry Apley, MD;  Location: [facility] OR;  Service: Orthopedics;  Laterality: Left;   Patient Active Problem List   Diagnosis Date Noted   CVA (cerebral vascular accident) (HCC) 12/05/2022    Pseudomonas aeruginosa infection 11/20/2019   Abrasion 07/30/2019   Medication management 07/03/2019   Abnormal findings on diagnostic imaging of lung 03/02/2019   New onset atrial fibrillation (HCC)    Hypokalemia    Acute blood loss anemia    Hip fracture (HCC) 09/04/2017   Cough 04/22/2016   COPD GOLD 4  04/22/2016   T wave inversion in EKG 05/10/2011   Hypertension    Arteriosclerotic cardiovascular disease (ASCVD)    Hypercholesteremia    Obesity    Ischemic heart disease    Increased glucose level     ONSET DATE: 12-04-22  REFERRING DIAG:  Diagnosis  I63.9 (ICD-10-CM) - Cerebrovascular accident (CVA), unspecified mechanism (HCC)    THERAPY DIAG:  Muscle weakness (generalized)  Other abnormalities of gait and mobility  Unsteadiness on feet  Rationale for Evaluation and Treatment: Rehabilitation  SUBJECTIVE:  SUBJECTIVE STATEMENT: Pt reports wife helped him do the exercises at home yesterday - says he used the 2# weight and increased number of reps to 20 on some of them.  Has trouble doing the sit to stand exercise - states "I couldn't get up without using my hands before this"   Pt accompanied by:  wife, Larry Briggs  PERTINENT HISTORY:   87 year old male with past medical history significant for hypertension, CAD, A-fib on Xarelto presents today for evaluation of weakness on the left side that started upon waking up this morning around 9 AM.  He states he went to bed last night feeling normal.  He states that initially started with decreased grip strength on the left, and then he noticed he was unable to walk normally throughout the day.  He feels that the grip strength has resolved.  Denies any difficulty with speech, facial droop, vision issues, or balance issues.  He states he feels  weaker on the left particularly in his left leg.  Denies history of CVA.;  h/o fracture in Lt femur in Feb. 2019 - was putting on pants, leg got caught in his pants and he lost his balance and fell  PAIN:  Are you having pain? No  PRECAUTIONS: Fall  WEIGHT BEARING RESTRICTIONS: No  FALLS: Has patient fallen in last 6 months? No  LIVING ENVIRONMENT: Lives with: lives with their spouse Lives in: House/apartment Stairs: Yes: External: 3 steps; on right going up Has following equipment at home: Single point cane  PLOF: Independent with basic ADLs, Independent with household mobility with device, Independent with community mobility with device, Independent with transfers, and Requires assistive device for independence  PATIENT GOALS: "be back close to where I was"; get Lt leg stronger   TODAY's TREATMENT:  12-23-22   GAIT: Gait pattern: step through pattern, decreased step length- Left, decreased hip/knee flexion- Left, decreased ankle dorsiflexion- Left, and lateral lean- Right Distance walked:  clinic distances with use of rollator; pt gait trained inside // bars forward (10') x 2 reps - pt able to turn around inside // bars without UE support (as instructed to attempt)  Assistive device utilized: rollator Level of assistance: SBA with rollator;  SBA inside // bars without UE support Comments: see above  Gait trained at end of session - with SPC with rubber quad base tip - pt gait trained 115' with CGA with this cane, but had no LOB; rubber quad tip provided increased stability compared to single point cane only with regular tip - pt reported he liked this cane and felt more steady with it compared to the one he has    NEURORE-ED:  Pt performed SLS exercise to improve SLS on each leg - 10 reps each leg alternating tap ups to 6" step placed inside // bars; 2# weight placed on LLE for hip flexor strengthening with tap up exercise:  RUE support used for assist with balance  Pt  performed marching in place 10 reps each leg - standing inside // bars but no UE support used Pt performed stepping over and back of black balance beam 5 reps LLE with UE support for Lt hip flexor & extensor strengthening - 2# weight on LLE for strengthening Pt performed kicking 3 bean bags (alternating feet) 20' with use of SPC with quad tip with CGA for improved SLS and coordination of LLE Pt performed rockerboard exercise inside // bars 10 reps x 2 sets with bil. UE support for improved dynamic balance as well  as for ankle strengthening - cues to stand erect SLS activity - touching 3 colored discs on floor -inside // bars with pt using RUE support on // bars - touched all 3 discs arranged in a half circle with each leg 5 reps each (no weight used on LLE) - progressive decrease in UE support on rep 4 with no UE support on 5th rep with each leg with CGA for safety  THEREX:  LLE step up exercise onto 4" step placed inside // bars with bil. UE support - 10 reps Rt Step down exercise from 4" step inside // bars for Lt eccentric quad strengthening 10 reps with bil. UE support on // bars  Sidestepping with squats inside // bars with UE support on bars 10' x 2 reps  Standing hip flexion LLE with 2# weight on leg for hip flexor strengthening     UPDATED HEP (12-21-22):  Medbridge Access Code: [email or web address] URL: [email or web address] Date: 12/22/2022 Prepared by: Maebelle Munroe  Exercises - Sit to Stand with Counter Support  - 1 x daily - 7 x weekly - 1 sets - 10 reps - Supine Bridge with Leg Extension  - 1 x daily - 7 x weekly - 1 sets - 10 reps - Supine Heel Slide  - 1 x daily - 7 x weekly - 1 sets - 10 reps - Clamshell with Resistance  - 1 x daily - 7 x weekly - 1 sets - 10 reps - 5 sec hold - Seated Shoulder Flexion with Dumbbells  - 1 x daily - 7 x weekly - 1 sets - 10 reps - Seated Shoulder Abduction with Dumbbells - Thumbs Up  - 1 x daily - 7 x weekly - 1 sets - 10 reps - Seated  Single Arm Bicep Curls Supinated with Dumbbell  - 1 x daily - 7 x weekly - 1 sets - 10 reps                                                                                                                            DATE: 12-07-22 (HEP instruction during eval session)  Access Code: [email or web address] URL: [email or web address] Date: 12/08/2022 Prepared by: Maebelle Munroe  Exercises - Sidelying Hip Abduction  - 1 x daily - 7 x weekly - 1 sets - 10 reps - Active Straight Leg Raise with Quad Set  - 1 x daily - 7 x weekly - 1 sets - 10 reps - Standing Hip Extension  - 1 x daily - 7 x weekly - 3 sets - 10 reps - Seated Ankle Dorsiflexion with Resistance  - 1 x daily - 7 x weekly - 3 sets - 10 reps (with red theraband)   PATIENT EDUCATION: Education details:  info on rubber quad tip for SPC ([employer] or order online) Person educated: Patient and Spouse Education method: Explanation, demonstration Education comprehension: verbalized understanding and returned demonstration  HOME EXERCISE PROGRAM: See above  GOALS: Goals reviewed with patient?  Yes  SHORT TERM GOALS: Target date: 01-07-23  Pt will be independent in HEP for LLE strengthening. Baseline: Goal status: INITIAL  2.  Improve TUG score to </= 16 secs with SPC to demo improved mobility and decreased fall risk. Baseline: 22.50 secs with SPC Goal status: INITIAL  3.  Amb. 57' with RW with CGA for increased safety with community ambulation. Baseline:  Goal status: INITIAL  4.  Pt will subjectively report increased ease with getting out of car Baseline:  Goal status: INITIAL  5.  Increase gait velocity to >/= 2.0 ft/sec with SPC for increased gait efficiency. Baseline: 19.06  secs = 1.72 ft/sec Goal status: INITIAL  6.  Complete Berg balance test Baseline: score 39/50 Goal status: Goal met 12-14-22  LONG TERM GOALS: Target date: 02-04-23  Improve Berg balance test score by at least 5 points to decrease fall risk. Baseline:  TBA  Goal status: INITIAL  2.  Pt will amb. 350' with RW for increased community accessibility and for increased safety with amb. Baseline: pt using SPC - amb. Approx. 41' with unsteady gait pattern Goal status: INITIAL  3.  Pt will negotiate 4 steps with use of Rt hand rail with SBA using step over step sequence.  Baseline: TBA Goal status: INITIAL  4.  Amb. Short household distances, I.e. 20', without use of device modified independently.  Baseline: pt using SPC Goal status: INITIAL  5.  Increase FOTO score to >/= 72/100 to demo improvement in functional status. Baseline: 56/100 Stroke LE Goal status: INITIAL  6.  Independent in updated HEP for LLE strengthening and balance. Baseline:  Goal status: INITIAL  ASSESSMENT:  CLINICAL IMPRESSION: PT session focused on standing balance exercises to improve SLS on each leg and also on strengthening LLE with use of 2# weight used with tap up exercise.  Session also focused on gait training with use of SPC with rubber quad tip which was very beneficial in providing increased stability compared to regular SPC.  Pt had no LOB with increased steadiness with ambulation with this device.  Pt reported liking this cane better than the regular SPC.   Cont with POC.    OBJECTIVE IMPAIRMENTS: Abnormal gait, cardiopulmonary status limiting activity, decreased balance, decreased endurance, decreased knowledge of use of DME, and decreased strength.   ACTIVITY LIMITATIONS: carrying, lifting, bending, standing, squatting, stairs, transfers, and locomotion level  PARTICIPATION LIMITATIONS: meal prep, cleaning, shopping, community activity, and church  PERSONAL FACTORS: Age, Past/current experiences, and 1-2 comorbidities: a-fib and h/o Lt femur fracture  are also affecting patient's functional outcome.   REHAB POTENTIAL: Good  CLINICAL DECISION MAKING: Evolving/moderate complexity  EVALUATION COMPLEXITY: Moderate  PLAN:  PT FREQUENCY:  2x/week  PT DURATION: 8 weeks (anticipate 6 week LOS)  PLANNED INTERVENTIONS: Therapeutic exercises, Therapeutic activity, Neuromuscular re-education, Balance training, Gait training, Patient/Family education, Self Care, Stair training, Orthotic/Fit training, and DME instructions  PLAN FOR NEXT SESSION: continue LLE strengthening and SLS activities; gait train with SPC with quad tip   Mehgan Santmyer, Donavan Burnet, PT 12/23/2022, 7:48 PM

## 2022-12-28 ENCOUNTER — Ambulatory Visit: Payer: Medicare Other | Admitting: Physical Therapy

## 2022-12-28 ENCOUNTER — Encounter: Payer: Self-pay | Admitting: Physical Therapy

## 2022-12-28 DIAGNOSIS — R2689 Other abnormalities of gait and mobility: Secondary | ICD-10-CM

## 2022-12-28 DIAGNOSIS — R2681 Unsteadiness on feet: Secondary | ICD-10-CM

## 2022-12-28 DIAGNOSIS — I69354 Hemiplegia and hemiparesis following cerebral infarction affecting left non-dominant side: Secondary | ICD-10-CM | POA: Diagnosis not present

## 2022-12-28 DIAGNOSIS — M6281 Muscle weakness (generalized): Secondary | ICD-10-CM | POA: Diagnosis not present

## 2022-12-28 DIAGNOSIS — I639 Cerebral infarction, unspecified: Secondary | ICD-10-CM | POA: Diagnosis not present

## 2022-12-28 NOTE — Therapy (Signed)
OUTPATIENT PHYSICAL THERAPY NEURO TREATMENT NOTE   Patient Name: Larry Briggs MRN: [id number] DOB:1935/08/22, 87 y.o., male Today's Date: 12/28/2022   PCP: Merri Brunette, MD REFERRING PROVIDER: Lorin Glass, MD  END OF SESSION:  PT End of Session - 12/28/22 1841     Visit Number 6    Number of Visits 17    Date for PT Re-Evaluation 02/04/23   anticipate 6 week LOS   Authorization Type Medicare    Authorization Time Period 12-07-22 - 02-06-23    PT Start Time 1447    PT Stop Time 1528    PT Time Calculation (min) 41 min    Equipment Utilized During Treatment Gait belt   SPC with rubber quad base   Activity Tolerance Patient tolerated treatment well    Behavior During Therapy WFL for tasks assessed/performed               Past Medical History:  Diagnosis Date   CAD (coronary artery disease)    Remote MI in 1980. CABG 1995. Stent to left main in 2004. Last cath in April of 2012. EF 50%; patent LIMA to DX/LAD with collateralization of the distal right, patent SVG to OM, occluded SVG to distal RCA, and patent stent to LAD   History of heart attack 1980   Hypercholesteremia    Hypertension    Increased glucose level    Obesity    Past Surgical History:  Procedure Laterality Date   CARDIAC CATHETERIZATION  1993   CARDIAC CATHETERIZATION  April 2012   Mild reduction if EF at 50%. Patent LIMA to DX/LAD with collateralization to distal RCA, patent SVG to OM and occluded SVG to distal right and patent stent to LAD/Left main;   CORONARY ARTERY BYPASS GRAFT  05/1994   x5 LIMA to LAD & DX, SVG to LCX, SVG to RCA   CORONARY STENT PLACEMENT  2004   Stent to the L main   INTRAMEDULLARY (IM) NAIL INTERTROCHANTERIC Left 09/05/2017   Procedure: INTRAMEDULLARY (IM) NAIL INTERTROCHANTRIC;  Surgeon: Sheral Apley, MD;  Location: [facility] OR;  Service: Orthopedics;  Laterality: Left;   Patient Active Problem List   Diagnosis Date Noted   CVA (cerebral vascular accident) (HCC)  12/05/2022   Pseudomonas aeruginosa infection 11/20/2019   Abrasion 07/30/2019   Medication management 07/03/2019   Abnormal findings on diagnostic imaging of lung 03/02/2019   New onset atrial fibrillation (HCC)    Hypokalemia    Acute blood loss anemia    Hip fracture (HCC) 09/04/2017   Cough 04/22/2016   COPD GOLD 4  04/22/2016   T wave inversion in EKG 05/10/2011   Hypertension    Arteriosclerotic cardiovascular disease (ASCVD)    Hypercholesteremia    Obesity    Ischemic heart disease    Increased glucose level     ONSET DATE: 12-04-22  REFERRING DIAG:  Diagnosis  I63.9 (ICD-10-CM) - Cerebrovascular accident (CVA), unspecified mechanism (HCC)    THERAPY DIAG:  Muscle weakness (generalized)  Other abnormalities of gait and mobility  Unsteadiness on feet  Rationale for Evaluation and Treatment: Rehabilitation  SUBJECTIVE:  SUBJECTIVE STATEMENT: Pt reports he has been doing the exercises at home - is up to 30 reps with some of them (the arm exercises) but still at 20 reps with some of the leg exercises; is doing 2 sets 5 reps of the sit to stand exercise - says this is the hardest one for him Pt accompanied by:  wife, Larry Briggs  PERTINENT HISTORY:   87 year old male with past medical history significant for hypertension, CAD, A-fib on Xarelto presents today for evaluation of weakness on the left side that started upon waking up this morning around 9 AM.  He states he went to bed last night feeling normal.  He states that initially started with decreased grip strength on the left, and then he noticed he was unable to walk normally throughout the day.  He feels that the grip strength has resolved.  Denies any difficulty with speech, facial droop, vision issues, or balance issues.  He states he  feels weaker on the left particularly in his left leg.  Denies history of CVA.;  h/o fracture in Lt femur in Feb. 2019 - was putting on pants, leg got caught in his pants and he lost his balance and fell  PAIN:  Are you having pain? No  PRECAUTIONS: Fall  WEIGHT BEARING RESTRICTIONS: No  FALLS: Has patient fallen in last 6 months? No  LIVING ENVIRONMENT: Lives with: lives with their spouse Lives in: House/apartment Stairs: Yes: External: 3 steps; on right going up Has following equipment at home: Single point cane  PLOF: Independent with basic ADLs, Independent with household mobility with device, Independent with community mobility with device, Independent with transfers, and Requires assistive device for independence  PATIENT GOALS: "be back close to where I was"; get Lt leg stronger   TODAY's TREATMENT:  12-28-22   GAIT: Gait pattern: step through pattern, decreased step length- Left, decreased hip/knee flexion- Left, decreased ankle dorsiflexion- Left, and lateral lean- Right Distance walked:  37' with SPC with quad tip Assistive device utilized: SPC with rubber quad tip; short distances (approx. 20') without device; 25' x 2 reps from mat to/from steps Level of assistance: CGA to SBA  - with and without use of SPC Comments: see above   Ramp negotiation - 2 reps up/down ramp with use of SPC with CGA   NEURORE-ED:  Pt performed SLS exercise to improve SLS on each leg - 10 reps each leg alternating tap ups to 6" step placed inside // bars; 2# weight placed on LLE for hip flexor strengthening with tap up exercise:  RUE support used for assist with balance - performed inside // bars then progressed to alternating tap ups each leg to actual step 5 reps with CGA with UE support; LLE only to 2nd step with RUE support on handrail with CGA  Pt performed stepping over and back of black balance beam 10 reps LLE with UE support for Lt hip flexor & extensor strengthening - 2# weight on  LLE for strengthening  Pt performed rockerboard exercise inside // bars 10 reps x 2 sets with bil. UE support for improved dynamic balance as well as for ankle strengthening - cues to stand erect  Standing on Airex inside // bars - no UE support used - head turns side to side 5 reps and then up/down 5 reps - no UE support used   SLS activity - touching 3 colored discs on floor -inside // bars with pt using RUE support on // bars - touched all  3 discs arranged in a half circle with each leg 5 reps each - progressive decrease in UE support - no UE support used on reps 4 and 5  THEREX:  LLE step up exercise onto 6" step 10 reps with bil. UE support  Pt performed stepping over and back of black balance beam 10 reps LLE with UE support for Lt hip flexor & extensor strengthening - 2# weight on LLE for strengthening Leg press - seat at 14:  60# bil. LE's 20 reps:  30# LLE only 2 sets 10 reps - min assist for controlled eccentric movement with return of weight Standing LLE hip and knee flexion with 2# weight 5 reps    UPDATED HEP (12-21-22):  Medbridge Access Code: [email or web address] URL: [email or web address] Date: 12/22/2022 Prepared by: Maebelle Munroe  Exercises - Sit to Stand with Counter Support  - 1 x daily - 7 x weekly - 1 sets - 10 reps - Supine Bridge with Leg Extension  - 1 x daily - 7 x weekly - 1 sets - 10 reps - Supine Heel Slide  - 1 x daily - 7 x weekly - 1 sets - 10 reps - Clamshell with Resistance  - 1 x daily - 7 x weekly - 1 sets - 10 reps - 5 sec hold - Seated Shoulder Flexion with Dumbbells  - 1 x daily - 7 x weekly - 1 sets - 10 reps - Seated Shoulder Abduction with Dumbbells - Thumbs Up  - 1 x daily - 7 x weekly - 1 sets - 10 reps - Seated Single Arm Bicep Curls Supinated with Dumbbell  - 1 x daily - 7 x weekly - 1 sets - 10 reps                                                                                                                            DATE: 12-07-22 (HEP  instruction during eval session)  Access Code: [email or web address] URL: [email or web address] Date: 12/08/2022 Prepared by: Maebelle Munroe  Exercises - Sidelying Hip Abduction  - 1 x daily - 7 x weekly - 1 sets - 10 reps - Active Straight Leg Raise with Quad Set  - 1 x daily - 7 x weekly - 1 sets - 10 reps - Standing Hip Extension  - 1 x daily - 7 x weekly - 3 sets - 10 reps - Seated Ankle Dorsiflexion with Resistance  - 1 x daily - 7 x weekly - 3 sets - 10 reps (with red theraband)   PATIENT EDUCATION: Education details:  info on rubber quad tip for [facility] ([facility] or order online) - wife ordered NOVA SPC with quad tip from online at start of session Person educated: Patient and Spouse Education method: Explanation, demonstration Education comprehension: verbalized understanding and returned demonstration  HOME EXERCISE PROGRAM: See above  GOALS: Goals reviewed with patient? Yes  SHORT TERM GOALS: Target date: 01-07-23  Pt will be independent in HEP for LLE  strengthening. Baseline: Goal status: INITIAL  2.  Improve TUG score to </= 16 secs with SPC to demo improved mobility and decreased fall risk. Baseline: 22.50 secs with SPC Goal status: INITIAL  3.  Amb. 28' with RW with CGA for increased safety with community ambulation. Baseline:  Goal status: INITIAL  4.  Pt will subjectively report increased ease with getting out of car Baseline:  Goal status: INITIAL  5.  Increase gait velocity to >/= 2.0 ft/sec with SPC for increased gait efficiency. Baseline: 19.06  secs = 1.72 ft/sec Goal status: INITIAL  6.  Complete Berg balance test Baseline: score 39/50 Goal status: Goal met 12-14-22  LONG TERM GOALS: Target date: 02-04-23  Improve Berg balance test score by at least 5 points to decrease fall risk. Baseline: TBA  Goal status: INITIAL  2.  Pt will amb. 350' with RW for increased community accessibility and for increased safety with amb. Baseline: pt using SPC - amb.  Approx. 20' with unsteady gait pattern Goal status: INITIAL  3.  Pt will negotiate 4 steps with use of Rt hand rail with SBA using step over step sequence.  Baseline: TBA Goal status: INITIAL  4.  Amb. Short household distances, I.e. 20', without use of device modified independently.  Baseline: pt using SPC Goal status: INITIAL  5.  Increase FOTO score to >/= 72/100 to demo improvement in functional status. Baseline: 56/100 Stroke LE Goal status: INITIAL  6.  Independent in updated HEP for LLE strengthening and balance. Baseline:  Goal status: INITIAL  ASSESSMENT:  CLINICAL IMPRESSION: PT session focused on LLE strengthening exercises with emphasis on Lt hip flexor strengthening with use of 2# weight in today's session.  Pt did well amb. With use of SPC with rubber quad tip without LOB and increased clearance of Lt foot noted in swing phase in today's session.  Pt also amb. Short distances (25') without use of device without LOB.  Pt continues to fatigue easily and requires short seated rest periods.  Cont with POC.    OBJECTIVE IMPAIRMENTS: Abnormal gait, cardiopulmonary status limiting activity, decreased balance, decreased endurance, decreased knowledge of use of DME, and decreased strength.   ACTIVITY LIMITATIONS: carrying, lifting, bending, standing, squatting, stairs, transfers, and locomotion level  PARTICIPATION LIMITATIONS: meal prep, cleaning, shopping, community activity, and church  PERSONAL FACTORS: Age, Past/current experiences, and 1-2 comorbidities: a-fib and h/o Lt femur fracture  are also affecting patient's functional outcome.   REHAB POTENTIAL: Good  CLINICAL DECISION MAKING: Evolving/moderate complexity  EVALUATION COMPLEXITY: Moderate  PLAN:  PT FREQUENCY: 2x/week  PT DURATION: 8 weeks (anticipate 6 week LOS)  PLANNED INTERVENTIONS: Therapeutic exercises, Therapeutic activity, Neuromuscular re-education, Balance training, Gait training, Patient/Family  education, Self Care, Stair training, Orthotic/Fit training, and DME instructions  PLAN FOR NEXT SESSION: continue LLE strengthening and SLS activities; gait train with SPC with quad tip   Thomasina Housley, Donavan Burnet, PT 12/28/2022, 6:43 PM

## 2022-12-30 ENCOUNTER — Ambulatory Visit: Payer: Medicare Other | Admitting: Physical Therapy

## 2022-12-30 DIAGNOSIS — R2689 Other abnormalities of gait and mobility: Secondary | ICD-10-CM

## 2022-12-30 DIAGNOSIS — M6281 Muscle weakness (generalized): Secondary | ICD-10-CM | POA: Diagnosis not present

## 2022-12-30 DIAGNOSIS — I639 Cerebral infarction, unspecified: Secondary | ICD-10-CM | POA: Diagnosis not present

## 2022-12-30 DIAGNOSIS — R2681 Unsteadiness on feet: Secondary | ICD-10-CM | POA: Diagnosis not present

## 2022-12-30 DIAGNOSIS — I69354 Hemiplegia and hemiparesis following cerebral infarction affecting left non-dominant side: Secondary | ICD-10-CM | POA: Diagnosis not present

## 2022-12-30 NOTE — Therapy (Signed)
OUTPATIENT PHYSICAL THERAPY NEURO TREATMENT NOTE   Patient Name: Larry Briggs MRN: [id number] DOB:17-Aug-1935, 87 y.o., male Today's Date: 12/31/2022   PCP: Merri Brunette, MD REFERRING PROVIDER: Lorin Glass, MD  END OF SESSION:  PT End of Session - 12/31/22 1118     Visit Number 7    Number of Visits 17    Date for PT Re-Evaluation 02/04/23   anticipate 6 week LOS   Authorization Type Medicare    Authorization Time Period 12-07-22 - 02-06-23    PT Start Time 1402    PT Stop Time 1446    PT Time Calculation (min) 44 min    Equipment Utilized During Treatment Gait belt   SPC with rubber quad base   Activity Tolerance Patient tolerated treatment well    Behavior During Therapy WFL for tasks assessed/performed                Past Medical History:  Diagnosis Date   CAD (coronary artery disease)    Remote MI in 1980. CABG 1995. Stent to left main in 2004. Last cath in April of 2012. EF 50%; patent LIMA to DX/LAD with collateralization of the distal right, patent SVG to OM, occluded SVG to distal RCA, and patent stent to LAD   History of heart attack 1980   Hypercholesteremia    Hypertension    Increased glucose level    Obesity    Past Surgical History:  Procedure Laterality Date   CARDIAC CATHETERIZATION  1993   CARDIAC CATHETERIZATION  April 2012   Mild reduction if EF at 50%. Patent LIMA to DX/LAD with collateralization to distal RCA, patent SVG to OM and occluded SVG to distal right and patent stent to LAD/Left main;   CORONARY ARTERY BYPASS GRAFT  05/1994   x5 LIMA to LAD & DX, SVG to LCX, SVG to RCA   CORONARY STENT PLACEMENT  2004   Stent to the L main   INTRAMEDULLARY (IM) NAIL INTERTROCHANTERIC Left 09/05/2017   Procedure: INTRAMEDULLARY (IM) NAIL INTERTROCHANTRIC;  Surgeon: Sheral Apley, MD;  Location: [facility] OR;  Service: Orthopedics;  Laterality: Left;   Patient Active Problem List   Diagnosis Date Noted   CVA (cerebral vascular accident) (HCC)  12/05/2022   Pseudomonas aeruginosa infection 11/20/2019   Abrasion 07/30/2019   Medication management 07/03/2019   Abnormal findings on diagnostic imaging of lung 03/02/2019   New onset atrial fibrillation (HCC)    Hypokalemia    Acute blood loss anemia    Hip fracture (HCC) 09/04/2017   Cough 04/22/2016   COPD GOLD 4  04/22/2016   T wave inversion in EKG 05/10/2011   Hypertension    Arteriosclerotic cardiovascular disease (ASCVD)    Hypercholesteremia    Obesity    Ischemic heart disease    Increased glucose level     ONSET DATE: 12-04-22  REFERRING DIAG:  Diagnosis  I63.9 (ICD-10-CM) - Cerebrovascular accident (CVA), unspecified mechanism (HCC)    THERAPY DIAG:  Muscle weakness (generalized)  Other abnormalities of gait and mobility  Unsteadiness on feet  Rationale for Evaluation and Treatment: Rehabilitation  SUBJECTIVE:  SUBJECTIVE STATEMENT: Wife reports pt is up to 30 reps with all the exercises in his HEP and has even increased the # of reps with sit to stand to 20 reps; states he is doing better, getting stronger Pt accompanied by:  wife, [name]  PERTINENT HISTORY:   87 year old male with past medical history significant for hypertension, CAD, A-fib on Xarelto presents today for evaluation of weakness on the left side that started upon waking up this morning around 9 AM.  He states he went to bed last night feeling normal.  He states that initially started with decreased grip strength on the left, and then he noticed he was unable to walk normally throughout the day.  He feels that the grip strength has resolved.  Denies any difficulty with speech, facial droop, vision issues, or balance issues.  He states he feels weaker on the left particularly in his left leg.  Denies history of  CVA.;  h/o fracture in Lt femur in Feb. 2019 - was putting on pants, leg got caught in his pants and he lost his balance and fell  PAIN:  Are you having pain? No  PRECAUTIONS: Fall  WEIGHT BEARING RESTRICTIONS: No  FALLS: Has patient fallen in last 6 months? No  LIVING ENVIRONMENT: Lives with: lives with their spouse Lives in: House/apartment Stairs: Yes: External: 3 steps; on right going up Has following equipment at home: Single point cane  PLOF: Independent with basic ADLs, Independent with household mobility with device, Independent with community mobility with device, Independent with transfers, and Requires assistive device for independence  PATIENT GOALS: "be back close to where I was"; get Lt leg stronger   TODAY's TREATMENT:  12-30-22   GAIT: Gait pattern: step through pattern, decreased step length- Left, decreased hip/knee flexion- Left, decreased ankle dorsiflexion- Left, and lateral lean- Right Distance walked:  68' with SPC with quad tip; short distances (20') without device Assistive device utilized: SPC with rubber quad tip; pt continues to use rollator to amb. In/out clinic Level of assistance: CGA to SBA  - with and without use of SPC Comments: see above      NEURORE-ED:  Pt performed SLS exercise to improve SLS on each leg - 10 reps each leg alternating tap ups to 6" step placed inside // bars; 2# weight placed on LLE for hip flexor strengthening with tap up exercise:  RUE support used for assist with balance - performed inside // bars  Pt performed stepping over and back of black balance beam 10 reps LLE with UE support for Lt hip flexor & extensor strengthening - 2# weight on LLE for strengthening  Pt performed rockerboard exercise inside // bars 10 reps x 2 sets with bil. UE support for improved dynamic balance as well as for ankle strengthening - cues to stand erect; performed stepping down to floor with RLE and pushing back up onto board with LLE for  quad strengthening - 10 reps with minimal UE support  Standing on Airex inside // bars - no UE support used - 10 sec hold - no head turns  SLS activity - touching 3 cones on floor -inside // bars with pt using min. Bil. UE support on // bars - touched all 3 cones arranged in a half circle with each leg 5 reps with 1 UE support - unable to perform without UE support  THEREX:  LLE step up exercise onto 6" step 5 reps only due to fatigue - with bil. UE support -  performed at end of session  Pt performed stepping over and back of black balance beam 10 reps LLE with UE support for Lt hip flexor & extensor strengthening - 2# weight on LLE for strengthening Leg press - seat at 14:  60# bil. LE's 20 reps:  35# LLE only 2 sets 10 reps - min assist for controlled eccentric movement with return of weight     UPDATED HEP (12-21-22):  Medbridge Access Code: [email or web address] URL: [email or web address] Date: 12/22/2022 Prepared by: Maebelle Munroe  Exercises - Sit to Stand with Counter Support  - 1 x daily - 7 x weekly - 1 sets - 10 reps - Supine Bridge with Leg Extension  - 1 x daily - 7 x weekly - 1 sets - 10 reps - Supine Heel Slide  - 1 x daily - 7 x weekly - 1 sets - 10 reps - Clamshell with Resistance  - 1 x daily - 7 x weekly - 1 sets - 10 reps - 5 sec hold - Seated Shoulder Flexion with Dumbbells  - 1 x daily - 7 x weekly - 1 sets - 10 reps - Seated Shoulder Abduction with Dumbbells - Thumbs Up  - 1 x daily - 7 x weekly - 1 sets - 10 reps - Seated Single Arm Bicep Curls Supinated with Dumbbell  - 1 x daily - 7 x weekly - 1 sets - 10 reps                                                                                                                            DATE: 12-07-22 (HEP instruction during eval session)  Access Code: [email or web address] URL: [email or web address] Date: 12/08/2022 Prepared by: Maebelle Munroe  Exercises - Sidelying Hip Abduction  - 1 x daily - 7 x weekly - 1  sets - 10 reps - Active Straight Leg Raise with Quad Set  - 1 x daily - 7 x weekly - 1 sets - 10 reps - Standing Hip Extension  - 1 x daily - 7 x weekly - 3 sets - 10 reps - Seated Ankle Dorsiflexion with Resistance  - 1 x daily - 7 x weekly - 3 sets - 10 reps (with red theraband)   PATIENT EDUCATION: Education details:  info on rubber quad tip for [facility] ([facility] or order online) - wife ordered NOVA SPC with quad tip from online at start of session Person educated: Patient and Spouse Education method: Explanation, demonstration Education comprehension: verbalized understanding and returned demonstration  HOME EXERCISE PROGRAM: See above  GOALS: Goals reviewed with patient? Yes  SHORT TERM GOALS: Target date: 01-07-23  Pt will be independent in HEP for LLE strengthening. Baseline: Goal status: INITIAL  2.  Improve TUG score to </= 16 secs with SPC to demo improved mobility and decreased fall risk. Baseline: 22.50 secs with SPC Goal status: INITIAL  3.  Amb. 23' with RW with CGA for increased safety with community ambulation. Baseline:  Goal status: INITIAL  4.  Pt will subjectively report increased ease with getting out of car Baseline:  Goal status: Goal met  5.  Increase gait velocity to >/= 2.0 ft/sec with SPC for increased gait efficiency. Baseline: 19.06  secs = 1.72 ft/sec Goal status: INITIAL  6.  Complete Berg balance test Baseline: score 39/50 Goal status: Goal met 12-14-22  LONG TERM GOALS: Target date: 02-04-23  Improve Berg balance test score by at least 5 points to decrease fall risk. Baseline: TBA  Goal status: INITIAL  2.  Pt will amb. 350' with RW for increased community accessibility and for increased safety with amb. Baseline: pt using SPC - amb. Approx. 76' with unsteady gait pattern Goal status: INITIAL  3.  Pt will negotiate 4 steps with use of Rt hand rail with SBA using step over step sequence.  Baseline: TBA Goal status: INITIAL  4.  Amb. Short  household distances, I.e. 20', without use of device modified independently.  Baseline: pt using SPC Goal status: INITIAL  5.  Increase FOTO score to >/= 72/100 to demo improvement in functional status. Baseline: 56/100 Stroke LE Goal status: INITIAL  6.  Independent in updated HEP for LLE strengthening and balance. Baseline:  Goal status: INITIAL  ASSESSMENT:  CLINICAL IMPRESSION: PT session focused on LLE strengthening exercises in closed chain position with use of 2# weight and also on standing balance to improve SLS on each leg.  Pt continues to fatigue quickly but is able to take short seated rest break and resume activity.  Pt demonstrates improved strength LLE with weight on leg press increasing from 30# to 35# for LLE only and also wife reports pt has increased # reps to 30 for all exercises in his HEP.  Balance and gait are improving - pt was informed that he is now safe enough to use [facility] for assistance to next PT session if he wants to, rather than using rollator.  Pt & wife verbalize understanding.  Cont with POC.    OBJECTIVE IMPAIRMENTS: Abnormal gait, cardiopulmonary status limiting activity, decreased balance, decreased endurance, decreased knowledge of use of DME, and decreased strength.   ACTIVITY LIMITATIONS: carrying, lifting, bending, standing, squatting, stairs, transfers, and locomotion level  PARTICIPATION LIMITATIONS: meal prep, cleaning, shopping, community activity, and church  PERSONAL FACTORS: Age, Past/current experiences, and 1-2 comorbidities: a-fib and h/o Lt femur fracture  are also affecting patient's functional outcome.   REHAB POTENTIAL: Good  CLINICAL DECISION MAKING: Evolving/moderate complexity  EVALUATION COMPLEXITY: Moderate  PLAN:  PT FREQUENCY: 2x/week  PT DURATION: 8 weeks (anticipate 6 week LOS)  PLANNED INTERVENTIONS: Therapeutic exercises, Therapeutic activity, Neuromuscular re-education, Balance training, Gait training,  Patient/Family education, Self Care, Stair training, Orthotic/Fit training, and DME instructions  PLAN FOR NEXT SESSION: begin checking STG's next week - continue LLE strengthening and SLS activities; gait train with SPC with quad tip   Daylin Eads, Donavan Burnet, PT 12/31/2022, 11:20 AM

## 2022-12-31 ENCOUNTER — Encounter: Payer: Self-pay | Admitting: Physical Therapy

## 2023-01-04 ENCOUNTER — Other Ambulatory Visit: Payer: Self-pay

## 2023-01-04 ENCOUNTER — Ambulatory Visit: Payer: Medicare Other | Attending: Internal Medicine | Admitting: Occupational Therapy

## 2023-01-04 ENCOUNTER — Encounter: Payer: Self-pay | Admitting: Physical Therapy

## 2023-01-04 ENCOUNTER — Encounter: Payer: Self-pay | Admitting: Occupational Therapy

## 2023-01-04 ENCOUNTER — Ambulatory Visit: Payer: Medicare Other | Admitting: Physical Therapy

## 2023-01-04 DIAGNOSIS — R2689 Other abnormalities of gait and mobility: Secondary | ICD-10-CM | POA: Insufficient documentation

## 2023-01-04 DIAGNOSIS — M6281 Muscle weakness (generalized): Secondary | ICD-10-CM

## 2023-01-04 DIAGNOSIS — R278 Other lack of coordination: Secondary | ICD-10-CM | POA: Insufficient documentation

## 2023-01-04 DIAGNOSIS — I69354 Hemiplegia and hemiparesis following cerebral infarction affecting left non-dominant side: Secondary | ICD-10-CM | POA: Insufficient documentation

## 2023-01-04 DIAGNOSIS — R2681 Unsteadiness on feet: Secondary | ICD-10-CM

## 2023-01-04 NOTE — Therapy (Signed)
OUTPATIENT OCCUPATIONAL THERAPY NEURO EVALUATION ONLY (DISCHARGE)  Patient Name: Larry Briggs MRN: [id number] DOB:1936/05/04, 87 y.o., male Today's Date: 01/04/2023  PCP: Merri Brunette, MD  REFERRING PROVIDER: Lorin Glass, MD  END OF SESSION:  OT End of Session - 01/04/23 1219     Visit Number 1    Number of Visits 1    OT Start Time 1225    OT Stop Time 1315    OT Time Calculation (min) 50 min    Equipment Utilized During Treatment Testing Equipment    Activity Tolerance Patient tolerated treatment well    Behavior During Therapy WFL for tasks assessed/performed             Past Medical History:  Diagnosis Date   CAD (coronary artery disease)    Remote MI in 1980. CABG 1995. Stent to left main in 2004. Last cath in April of 2012. EF 50%; patent LIMA to DX/LAD with collateralization of the distal right, patent SVG to OM, occluded SVG to distal RCA, and patent stent to LAD   History of heart attack 1980   Hypercholesteremia    Hypertension    Increased glucose level    Obesity    Past Surgical History:  Procedure Laterality Date   CARDIAC CATHETERIZATION  1993   CARDIAC CATHETERIZATION  April 2012   Mild reduction if EF at 50%. Patent LIMA to DX/LAD with collateralization to distal RCA, patent SVG to OM and occluded SVG to distal right and patent stent to LAD/Left main;   CORONARY ARTERY BYPASS GRAFT  05/1994   x5 LIMA to LAD & DX, SVG to LCX, SVG to RCA   CORONARY STENT PLACEMENT  2004   Stent to the L main   INTRAMEDULLARY (IM) NAIL INTERTROCHANTERIC Left 09/05/2017   Procedure: INTRAMEDULLARY (IM) NAIL INTERTROCHANTRIC;  Surgeon: Sheral Apley, MD;  Location: [facility] OR;  Service: Orthopedics;  Laterality: Left;   Patient Active Problem List   Diagnosis Date Noted   CVA (cerebral vascular accident) (HCC) 12/05/2022   Pseudomonas aeruginosa infection 11/20/2019   Abrasion 07/30/2019   Medication management 07/03/2019   Abnormal findings on diagnostic imaging  of lung 03/02/2019   New onset atrial fibrillation (HCC)    Hypokalemia    Acute blood loss anemia    Hip fracture (HCC) 09/04/2017   Cough 04/22/2016   COPD GOLD 4  04/22/2016   T wave inversion in EKG 05/10/2011   Hypertension    Arteriosclerotic cardiovascular disease (ASCVD)    Hypercholesteremia    Obesity    Ischemic heart disease    Increased glucose level     ONSET DATE: 12/06/2022 - referral; 12/04/22 - onset  REFERRING DIAG: I63.9 (ICD-10-CM) - Cerebral infarction, unspecified  THERAPY DIAG:  Muscle weakness (generalized)  Other lack of coordination  Hemiplegia and hemiparesis following cerebral infarction affecting left non-dominant side (HCC)  Rationale for Evaluation and Treatment: Rehabilitation  SUBJECTIVE:   SUBJECTIVE STATEMENT: Patient and spouse report that he has been working on the exercises recommended by the PT for UE strengthening and they are going well.  She brought the handouts to show OT.  Pt accompanied by: self and significant other - [name]  PERTINENT HISTORY:    MR Review: "87 year old male with past medical history significant for hypertension, CAD, A-fib on Xarelto presents today for evaluation of weakness on the left side that started upon waking the morning of 12/04/22.  He states he went to bed last night feeling normal.  He states  that initially started with decreased grip strength on the left, and then he noticed he was unable to walk normally throughout the day.  He feels that the grip strength has resolved.  Denies any difficulty with speech, facial droop, vision issues, or balance issues.  He states he feels weaker on the left particularly in his left leg.  Denies history of CVA.;  h/o fracture in Lt femur in Feb. 2019 - was putting on pants, leg got caught in his pants and he lost his balance and fell."  PRECAUTIONS: None  WEIGHT BEARING RESTRICTIONS: No  PAIN:  Are you having pain? No  FALLS: Has patient fallen in last 6 months?  No  LIVING ENVIRONMENT: Lives with: lives with their spouse Lives in: House/apartment Stairs: Yes: External: 4 steps; on right going up Has following equipment at home: Single point cane, Quad cane small base, Walker - 4 wheeled, Wheelchair (manual), shower chair, and Grab bars (equipment from previous injury in 2019 - L femur fracture  PLOF: Independent with basic ADLs  PATIENT GOALS: Get back to where he was before this CVA.  Using the L hand better, although reports that he is doing much better since the initial onset of weakness with the stroke 1 month ago.  OBJECTIVE:   HAND DOMINANCE: Right  ADLs:  Independent with basic ADLs, Independent with household mobility with device (new quad cane with patient today), Independent with community mobility with device, Independent with transfers,   Overall ADLs: Has return to functional independence Transfers/ambulation related to ADLs: MI Eating: Independent - reports no difficulty with cutting food Grooming: Independent with shaving etc UB Dressing: Independent and has recently resumed fastening his own buttons LB Dressing: Independent  Toileting: MI Bathing: MI  Tub Shower transfers: SBA - Wife takes the shower seat out of the tub, he steps in and then she puts the seat back in the tub for him to sit on Equipment: Shower seat with back and Grab bars  IADLs: Shopping: Spouse   Light housekeeping: Spouse Meal Prep: Spouse although he can get himself breakfast or get a sandwich Community mobility: MI with cane or most recently walker Medication management: Puts meds in pill box for himself Financial management: Wife has always done this  Handwriting: 100% legible - R handed   MOBILITY STATUS: Needs Assist: Just transitioning from walker to cane  POSTURE COMMENTS:  rounded shoulders and forward head Sitting balance:  WFL  ACTIVITY TOLERANCE: Activity tolerance: COPD, Afib with shortness of breath but no real change recently  from stroke  FUNCTIONAL OUTCOME MEASURES: Modified Barthel Index: Self Care Assessment TOTAL: 94/100 Chair/Bed Transfers: 15/15 Ambulation: 12/15 OR WC Mobility: na/5 Stair Climbing: 8/10 Toilet Transfers: 10/10 Bowel Control: 10/10 Bladder Control: 10/10 Bathing: 4/5 Dressing: 10/10 Personal Hygiene: 5/5 Feeding: 10/10  Interpretation: 0-20 - Total Dependence 21-60 - Severe Dependence 61-90 - Moderate Dependence 91-99 - Slight Dependence 100 - Independence  UPPER EXTREMITY ROM:    Patient demonstrates good UE ROM equal on Left compared to Right side now. AROM - WFL  UPPER EXTREMITY MMT:     MMT Right eval Left eval  Shoulder flexion 4+/5 4-/5  Shoulder abduction    Shoulder adduction    Shoulder extension    Shoulder internal rotation    Shoulder external rotation    Middle trapezius    Lower trapezius    Elbow flexion    Elbow extension    Wrist flexion    Wrist extension    Wrist  ulnar deviation    Wrist radial deviation    Wrist pronation    Wrist supination    (Blank rows = not tested)  HAND FUNCTION: Grip strength: Right: 76.0 lbs; Left: 59.3 lbs  COORDINATION: 9 Hole Peg test: Right: 28.25  sec; Left: 48.25 sec Box and Blocks:  Right 40 blocks, Left 38 blocks  SENSATION: WFL Light touch: WFL  EDEMA: sometimes on his ankle from previous issues  MUSCLE TONE: RUE: Within functional limits and LUE: Within functional limits  COGNITION: Overall cognitive status: Within functional limits for tasks assessed - Patient attributes some memory deficits due to age  VISION: Subjective report: No significant change since stroke Baseline vision: Wears glasses for reading only Visual history: cataracts and surgeries - 2021  VISION ASSESSMENT: Not tested  PERCEPTION: WFL  PRAXIS: WFL  OBSERVATIONS: Patient is a pleasant 87 year old man who presents approximately 1 month following a stroke affecting his L side/hand.  He has been receiving physical  therapy services and PT provided some upper extremity exercises for him to perform at home.  He ambulated with a quad cane today which he reports is new as he was previously using a walker.  He is well dressed and demonstrates functional use of L UE at this time.                                                                                                             PATIENT EDUCATION: Education details: Functional activities to work on L UE coordination ie) manipulation of money, nuts & bolts and carrying small bags of groceries and helping with laundry Person educated: Patient and Spouse Education method: Explanation and Verbal cues Education comprehension: verbalized understanding  HOME EXERCISE PROGRAM: PT issued UE strengthening activities which remain appropriate [email or web address] Code [email or web address]  -Seated Shoulder Flexion with Dumbbells -Seated Shoulder Abduction with Dumbbells - Thumbs Up -Seated Single Arm Bicep Curls Supinated with Dumbbell   ASSESSMENT:  CLINICAL IMPRESSION: Patient is a 87 y.o. male who was seen today for occupational therapy evaluation for L UE deficits following stroke on 12/04/22.  Patient has some minor limitations on left side still including grip strength greater than 15 pound difference and some general weakness against resistance with left upper extremity but not significant at this time after spontaneous recovery from CVA.  At this time patient and spouse are satisfied with continuing physical therapy and following up with upper extremity strengthening activities previously recommended as well as adding some functional tasks as demonstrated during OT evaluation today.  PERFORMANCE DEFICITS: in functional skills including coordination, dexterity, and strength,   IMPAIRMENTS: are limiting patient from ADLs and IADLs.   CO-MORBIDITIES: may have co-morbidities  that affects occupational performance. Patient will benefit from skilled OT to  address above impairments and improve overall function.  MODIFICATION OR ASSISTANCE TO COMPLETE EVALUATION: No modification of tasks or assist necessary to complete an evaluation.  OT OCCUPATIONAL PROFILE AND HISTORY: Problem focused assessment: Including review of records relating to presenting problem.  CLINICAL DECISION MAKING: LOW - limited  treatment options, no task modification necessary  REHAB POTENTIAL: Excellent  EVALUATION COMPLEXITY: Low    PLAN:  RECOMMENDED OTHER SERVICES: Patient is continuing with physical therapy.  CONSULTED AND AGREED WITH PLAN OF CARE: Patient and spouse  PLAN : Patient needs addressed at eval. No further need for skilled OT services at this time.     Victorino Sparrow, OT 01/04/2023, 3:01 PM

## 2023-01-04 NOTE — Therapy (Unsigned)
OUTPATIENT PHYSICAL THERAPY NEURO TREATMENT NOTE   Patient Name: Larry Briggs MRN: [id number] DOB:11/20/1935, 87 y.o., male Today's Date: 01/05/2023   PCP: Merri Brunette, MD REFERRING PROVIDER: Lorin Glass, MD  END OF SESSION:  PT End of Session - 01/05/23 1153     Visit Number 8    Number of Visits 17    Date for PT Re-Evaluation 02/04/23   anticipate 6 week LOS   Authorization Type Medicare    Authorization Time Period 12-07-22 - 02-06-23    PT Start Time 1316    PT Stop Time 1400    PT Time Calculation (min) 44 min    Equipment Utilized During Treatment Gait belt   SPC with rubber quad base   Activity Tolerance Patient tolerated treatment well    Behavior During Therapy WFL for tasks assessed/performed                 Past Medical History:  Diagnosis Date   CAD (coronary artery disease)    Remote MI in 1980. CABG 1995. Stent to left main in 2004. Last cath in April of 2012. EF 50%; patent LIMA to DX/LAD with collateralization of the distal right, patent SVG to OM, occluded SVG to distal RCA, and patent stent to LAD   History of heart attack 1980   Hypercholesteremia    Hypertension    Increased glucose level    Obesity    Past Surgical History:  Procedure Laterality Date   CARDIAC CATHETERIZATION  1993   CARDIAC CATHETERIZATION  April 2012   Mild reduction if EF at 50%. Patent LIMA to DX/LAD with collateralization to distal RCA, patent SVG to OM and occluded SVG to distal right and patent stent to LAD/Left main;   CORONARY ARTERY BYPASS GRAFT  05/1994   x5 LIMA to LAD & DX, SVG to LCX, SVG to RCA   CORONARY STENT PLACEMENT  2004   Stent to the L main   INTRAMEDULLARY (IM) NAIL INTERTROCHANTERIC Left 09/05/2017   Procedure: INTRAMEDULLARY (IM) NAIL INTERTROCHANTRIC;  Surgeon: Sheral Apley, MD;  Location: [facility] OR;  Service: Orthopedics;  Laterality: Left;   Patient Active Problem List   Diagnosis Date Noted   CVA (cerebral vascular accident) (HCC)  12/05/2022   Pseudomonas aeruginosa infection 11/20/2019   Abrasion 07/30/2019   Medication management 07/03/2019   Abnormal findings on diagnostic imaging of lung 03/02/2019   New onset atrial fibrillation (HCC)    Hypokalemia    Acute blood loss anemia    Hip fracture (HCC) 09/04/2017   Cough 04/22/2016   COPD GOLD 4  04/22/2016   T wave inversion in EKG 05/10/2011   Hypertension    Arteriosclerotic cardiovascular disease (ASCVD)    Hypercholesteremia    Obesity    Ischemic heart disease    Increased glucose level     ONSET DATE: 12-04-22  REFERRING DIAG:  Diagnosis  I63.9 (ICD-10-CM) - Cerebrovascular accident (CVA), unspecified mechanism (HCC)    THERAPY DIAG:  Muscle weakness (generalized)  Other abnormalities of gait and mobility  Unsteadiness on feet  Rationale for Evaluation and Treatment: Rehabilitation  SUBJECTIVE:  SUBJECTIVE STATEMENT: Pt using his new SPC with rubber quad tip for assistance with ambulation to PT today; pt had OT eval prior to scheduled PT appt - "she said I don't need the OT now"; pt reports he likes his new cane (ordered it from online); states it is better than his old cane Pt accompanied by:  wife, [name]  PERTINENT HISTORY:   87 year old male with past medical history significant for hypertension, CAD, A-fib on Xarelto presents today for evaluation of weakness on the left side that started upon waking up this morning around 9 AM.  He states he went to bed last night feeling normal.  He states that initially started with decreased grip strength on the left, and then he noticed he was unable to walk normally throughout the day.  He feels that the grip strength has resolved.  Denies any difficulty with speech, facial droop, vision issues, or balance issues.  He  states he feels weaker on the left particularly in his left leg.  Denies history of CVA.;  h/o fracture in Lt femur in Feb. 2019 - was putting on pants, leg got caught in his pants and he lost his balance and fell  PAIN:  Are you having pain? No  PRECAUTIONS: Fall  WEIGHT BEARING RESTRICTIONS: No  FALLS: Has patient fallen in last 6 months? No  LIVING ENVIRONMENT: Lives with: lives with their spouse Lives in: House/apartment Stairs: Yes: External: 3 steps; on right going up Has following equipment at home: Single point cane  PLOF: Independent with basic ADLs, Independent with household mobility with device, Independent with community mobility with device, Independent with transfers, and Requires assistive device for independence  PATIENT GOALS: "be back close to where I was"; get Lt leg stronger   TODAY's TREATMENT:  01-04-23   GAIT: Gait pattern: step through pattern, decreased step length- Left, decreased hip/knee flexion- Left, decreased ankle dorsiflexion- Left, and lateral lean- Right Distance walked:  21' with SPC with quad tip Assistive device utilized: SPC with rubber quad tip Level of assistance: CGA to SBA   Comments: see above      NEURORE-ED:  TUG score 14.25 secs with SPC - STG met  3#  weight  on LLE - tap ups to 1st step, 2nd step, 3rd step 5 reps to each step for Lt hip flexor strengthening - bil. UE support used on handrails  Tap ups to 2nd step 10 reps with RLE for improved SLS on LLE (no weight used)  Cone taps (3)  - at counter for UE support prn - pt performed 5 reps to 3 cones with each leg with CGA to improve SLS on each leg and also for improved coordination LLE  Standing on [employer] inside // bars - no UE support used - 10 sec hold - no head turns  SLS activity - touching 3 cones on floor -inside // bars with pt using min. Bil. UE support on // bars - touched all 3 cones arranged in a half circle with each leg 5 reps with 1 UE support - unable to  perform without UE support  THEREX:  Step ups LLE 10 reps no weight used - to 6" step with bil. UE support (cues to not pull up with arms)  Lateral step ups LLE 3# weight used -  5 reps with UE support at counter with CGA:   lateral lift to step LLE 10 reps with 3# weight for hip abductor strengthening  Standing Lt hip strengthening exercises -  3# weight used - pt performed Lt hip abduction & extension 10 reps:  standing hip flexion with knee flexed with 3# weight 10 reps  Leg press seat at 14:  60# 20 reps bil. LE's:    LLE only 35# 20 reps:  pt requested to try exercise with increased weight - performed 70# bil. LE's 10 reps x 1 set   UPDATED HEP (12-21-22):  Medbridge Access Code: [email or web address] URL: [email or web address] Date: 12/22/2022 Prepared by: Maebelle Munroe  Exercises - Sit to Stand with Counter Support  - 1 x daily - 7 x weekly - 1 sets - 10 reps - Supine Bridge with Leg Extension  - 1 x daily - 7 x weekly - 1 sets - 10 reps - Supine Heel Slide  - 1 x daily - 7 x weekly - 1 sets - 10 reps - Clamshell with Resistance  - 1 x daily - 7 x weekly - 1 sets - 10 reps - 5 sec hold - Seated Shoulder Flexion with Dumbbells  - 1 x daily - 7 x weekly - 1 sets - 10 reps - Seated Shoulder Abduction with Dumbbells - Thumbs Up  - 1 x daily - 7 x weekly - 1 sets - 10 reps - Seated Single Arm Bicep Curls Supinated with Dumbbell  - 1 x daily - 7 x weekly - 1 sets - 10 reps                                                                                                                            DATE: 12-07-22 (HEP instruction during eval session)  Access Code: [email or web address] URL: [email or web address] Date: 12/08/2022 Prepared by: Maebelle Munroe  Exercises - Sidelying Hip Abduction  - 1 x daily - 7 x weekly - 1 sets - 10 reps - Active Straight Leg Raise with Quad Set  - 1 x daily - 7 x weekly - 1 sets - 10 reps - Standing Hip Extension  - 1 x daily - 7 x weekly - 3 sets - 10  reps - Seated Ankle Dorsiflexion with Resistance  - 1 x daily - 7 x weekly - 3 sets - 10 reps (with red theraband)   PATIENT EDUCATION: Education details:  info on rubber quad tip for [facility] ([facility] or order online) - wife ordered [employer] SPC with quad tip from online at start of session Person educated: Patient and Spouse Education method: Explanation, demonstration Education comprehension: verbalized understanding and returned demonstration  HOME EXERCISE PROGRAM: See above  GOALS: Goals reviewed with patient? Yes  SHORT TERM GOALS: Target date: 01-07-23  Pt will be independent in HEP for LLE strengthening. Baseline: Goal status: Goal met 01-04-23  2.  Improve TUG score to </= 16 secs with SPC to demo improved mobility and decreased fall risk. Baseline: 22.50 secs with SPC;  14.25 secs with SPC on 01-04-23 Goal status: Goal met [id number]  3.  Amb. 90' with RW with CGA for increased  safety with community ambulation. Baseline: 18' with SPC  Goal status: Goal met 01-04-23    4.  Pt will subjectively report increased ease with getting out of car Baseline:  Goal status: Goal met 12-30-22  5.  Increase gait velocity to >/= 2.0 ft/sec with SPC for increased gait efficiency. Baseline: 19.06  secs = 1.72 ft/sec Goal status: INITIAL  6.  Complete Berg balance test Baseline: score 39/50 Goal status: Goal met 12-14-22  LONG TERM GOALS: Target date: 02-04-23  Improve Berg balance test score by at least 5 points to decrease fall risk. Baseline: TBA  Goal status: INITIAL  2.  Pt will amb. 350' with RW for increased community accessibility and for increased safety with amb. Baseline: pt using SPC - amb. Approx. 42' with unsteady gait pattern Goal status: INITIAL  3.  Pt will negotiate 4 steps with use of Rt hand rail with SBA using step over step sequence.  Baseline: TBA Goal status: INITIAL  4.  Amb. Short household distances, I.e. 20', without use of device modified independently.   Baseline: pt using SPC Goal status: INITIAL  5.  Increase FOTO score to >/= 72/100 to demo improvement in functional status. Baseline: 56/100 Stroke LE Goal status: INITIAL  6.  Independent in updated HEP for LLE strengthening and balance. Baseline:  Goal status: INITIAL  ASSESSMENT:  CLINICAL IMPRESSION: PT session focused on STG assessment, LLE strengthening and standing balance exercises to improve SLS on each leg.  Pt has met STG's #1-4; STG #5 was previously met on 12-14-22 and STG #5 to be assessed next session.  Pt is improving with balance and gait as he used his new SPC with rubber quad tip for assistance with ambulation to today's PT session.  Pt is progressing well - plan D/C in 2 weeks.  Cont with POC.    OBJECTIVE IMPAIRMENTS: Abnormal gait, cardiopulmonary status limiting activity, decreased balance, decreased endurance, decreased knowledge of use of DME, and decreased strength.   ACTIVITY LIMITATIONS: carrying, lifting, bending, standing, squatting, stairs, transfers, and locomotion level  PARTICIPATION LIMITATIONS: meal prep, cleaning, shopping, community activity, and church  PERSONAL FACTORS: Age, Past/current experiences, and 1-2 comorbidities: a-fib and h/o Lt femur fracture  are also affecting patient's functional outcome.   REHAB POTENTIAL: Good  CLINICAL DECISION MAKING: Evolving/moderate complexity  EVALUATION COMPLEXITY: Moderate  PLAN:  PT FREQUENCY: 2x/week  PT DURATION: 8 weeks (anticipate 6 week LOS)  PLANNED INTERVENTIONS: Therapeutic exercises, Therapeutic activity, Neuromuscular re-education, Balance training, Gait training, Patient/Family education, Self Care, Stair training, Orthotic/Fit training, and DME instructions  PLAN FOR NEXT SESSION: check STG's #5 - continue LLE strengthening and SLS activities; gait train with SPC with quad tip   Aerica Rincon, Donavan Burnet, PT 01/05/2023, 11:55 AM

## 2023-01-06 ENCOUNTER — Ambulatory Visit: Payer: Medicare Other | Admitting: Physical Therapy

## 2023-01-06 ENCOUNTER — Ambulatory Visit: Payer: Medicare Other | Admitting: Occupational Therapy

## 2023-01-06 DIAGNOSIS — M6281 Muscle weakness (generalized): Secondary | ICD-10-CM

## 2023-01-06 DIAGNOSIS — R2689 Other abnormalities of gait and mobility: Secondary | ICD-10-CM

## 2023-01-06 DIAGNOSIS — I69354 Hemiplegia and hemiparesis following cerebral infarction affecting left non-dominant side: Secondary | ICD-10-CM | POA: Diagnosis not present

## 2023-01-06 DIAGNOSIS — R278 Other lack of coordination: Secondary | ICD-10-CM | POA: Diagnosis not present

## 2023-01-06 DIAGNOSIS — R2681 Unsteadiness on feet: Secondary | ICD-10-CM

## 2023-01-06 NOTE — Therapy (Signed)
OUTPATIENT PHYSICAL THERAPY NEURO TREATMENT NOTE   Patient Name: Larry Briggs MRN: [id number] DOB:1936-02-18, 87 y.o., male Today's Date: 01/07/2023   PCP: Merri Brunette, MD REFERRING PROVIDER: Lorin Glass, MD  END OF SESSION:  PT End of Session - 01/07/23 1704     Visit Number 9    Number of Visits 17    Date for PT Re-Evaluation 02/04/23   anticipate 6 week LOS   Authorization Type Medicare    Authorization Time Period 12-07-22 - 02-06-23    PT Start Time 1315    PT Stop Time 1400    PT Time Calculation (min) 45 min    Equipment Utilized During Treatment Gait belt    Activity Tolerance Patient tolerated treatment well    Behavior During Therapy WFL for tasks assessed/performed                  Past Medical History:  Diagnosis Date   CAD (coronary artery disease)    Remote MI in 1980. CABG 1995. Stent to left main in 2004. Last cath in April of 2012. EF 50%; patent LIMA to DX/LAD with collateralization of the distal right, patent SVG to OM, occluded SVG to distal RCA, and patent stent to LAD   History of heart attack 1980   Hypercholesteremia    Hypertension    Increased glucose level    Obesity    Past Surgical History:  Procedure Laterality Date   CARDIAC CATHETERIZATION  1993   CARDIAC CATHETERIZATION  April 2012   Mild reduction if EF at 50%. Patent LIMA to DX/LAD with collateralization to distal RCA, patent SVG to OM and occluded SVG to distal right and patent stent to LAD/Left main;   CORONARY ARTERY BYPASS GRAFT  05/1994   x5 LIMA to LAD & DX, SVG to LCX, SVG to RCA   CORONARY STENT PLACEMENT  2004   Stent to the L main   INTRAMEDULLARY (IM) NAIL INTERTROCHANTERIC Left 09/05/2017   Procedure: INTRAMEDULLARY (IM) NAIL INTERTROCHANTRIC;  Surgeon: Sheral Apley, MD;  Location: [facility] OR;  Service: Orthopedics;  Laterality: Left;   Patient Active Problem List   Diagnosis Date Noted   CVA (cerebral vascular accident) (HCC) 12/05/2022   Pseudomonas  aeruginosa infection 11/20/2019   Abrasion 07/30/2019   Medication management 07/03/2019   Abnormal findings on diagnostic imaging of lung 03/02/2019   New onset atrial fibrillation (HCC)    Hypokalemia    Acute blood loss anemia    Hip fracture (HCC) 09/04/2017   Cough 04/22/2016   COPD GOLD 4  04/22/2016   T wave inversion in EKG 05/10/2011   Hypertension    Arteriosclerotic cardiovascular disease (ASCVD)    Hypercholesteremia    Obesity    Ischemic heart disease    Increased glucose level     ONSET DATE: 12-04-22  REFERRING DIAG:  Diagnosis  I63.9 (ICD-10-CM) - Cerebrovascular accident (CVA), unspecified mechanism (HCC)    THERAPY DIAG:  Muscle weakness (generalized)  Other abnormalities of gait and mobility  Unsteadiness on feet  Rationale for Evaluation and Treatment: Rehabilitation  SUBJECTIVE:  SUBJECTIVE STATEMENT: Pt reports he is doing fine - no new problems or issues; continues to do his exercises at home; using his new SPC with rubber quad tip Pt accompanied by:  wife, [name]  PERTINENT HISTORY:   87 year old male with past medical history significant for hypertension, CAD, A-fib on Xarelto presents today for evaluation of weakness on the left side that started upon waking up this morning around 9 AM.  He states he went to bed last night feeling normal.  He states that initially started with decreased grip strength on the left, and then he noticed he was unable to walk normally throughout the day.  He feels that the grip strength has resolved.  Denies any difficulty with speech, facial droop, vision issues, or balance issues.  He states he feels weaker on the left particularly in his left leg.  Denies history of CVA.;  h/o fracture in Lt femur in Feb. 2019 - was putting on pants,  leg got caught in his pants and he lost his balance and fell  PAIN:  Are you having pain? No  PRECAUTIONS: Fall  WEIGHT BEARING RESTRICTIONS: No  FALLS: Has patient fallen in last 6 months? No  LIVING ENVIRONMENT: Lives with: lives with their spouse Lives in: House/apartment Stairs: Yes: External: 3 steps; on right going up Has following equipment at home: Single point cane  PLOF: Independent with basic ADLs, Independent with household mobility with device, Independent with community mobility with device, Independent with transfers, and Requires assistive device for independence  PATIENT GOALS: "be back close to where I was"; get Lt leg stronger   TODAY's TREATMENT:  01-06-23   GAIT: Gait pattern: step through pattern, decreased step length- Left, decreased hip/knee flexion- Left, decreased ankle dorsiflexion- Left, and lateral lean- Right Distance walked:  clinic distances with SPC with quad tip Assistive device utilized: SPC with rubber quad tip Level of assistance: SBA   Comments: see above   Gait velocity - 12.7 secs = 2.58 ft/sec with SPC   NEURORE-ED:  Tap ups to 1st step, 2nd step, 3rd step 5 reps to each step for Lt hip flexor strengthening - bil. UE support used on handrails; pt performed tap ups to 2nd step 10 reps with RLE for improved SLS on LLE with 2 finger support on each hand on rails  Cone taps (2)  - inside // bars for UE support prn - pt performed 5 reps to 2 cones with each leg with CGA to improve SLS on each leg; then 5 reps diagonal taps with each foot to each cone with minimal UE support  Standing on [employer] inside // bars - no UE support used - 10 sec hold - no head turns  Rockerboard - 10 reps x 2 sets anterior/posterior weight shifts with min. UE support on // bars; stepping down forward from rockerboard to floor 5 reps each leg with bil. UE support  4 square stepping "+" - no device used - CGA to min assist - pt performed 3 reps to improve balance  with stepping in various directions   THEREX:  Step ups LLE 10 reps no weight used - to 6" step with bil. UE support (cues to not pull up with arms)  Bil. Heel raise 10 reps with UE support on handrails at steps  Squats 10 reps inside // bars with min. UE support  Standing Lt hip strengthening exercises - 3# weight used - pt performed Lt hip abduction & extension 10 reps:  standing  hip flexion with knee flexed with 3# weight 10 reps  Leg press seat at 14:  70# 20 reps bil. LE's:    LLE only 40# 3 sets 10 reps:     UPDATED HEP (12-21-22):  Medbridge Access Code: [email or web address] URL: [email or web address] Date: 12/22/2022 Prepared by: Maebelle Munroe  Exercises - Sit to Stand with Counter Support  - 1 x daily - 7 x weekly - 1 sets - 10 reps - Supine Bridge with Leg Extension  - 1 x daily - 7 x weekly - 1 sets - 10 reps - Supine Heel Slide  - 1 x daily - 7 x weekly - 1 sets - 10 reps - Clamshell with Resistance  - 1 x daily - 7 x weekly - 1 sets - 10 reps - 5 sec hold - Seated Shoulder Flexion with Dumbbells  - 1 x daily - 7 x weekly - 1 sets - 10 reps - Seated Shoulder Abduction with Dumbbells - Thumbs Up  - 1 x daily - 7 x weekly - 1 sets - 10 reps - Seated Single Arm Bicep Curls Supinated with Dumbbell  - 1 x daily - 7 x weekly - 1 sets - 10 reps                                                                                                                            DATE: 12-07-22 (HEP instruction during eval session)  Access Code: [email or web address] URL: [email or web address] Date: 12/08/2022 Prepared by: Maebelle Munroe  Exercises - Sidelying Hip Abduction  - 1 x daily - 7 x weekly - 1 sets - 10 reps - Active Straight Leg Raise with Quad Set  - 1 x daily - 7 x weekly - 1 sets - 10 reps - Standing Hip Extension  - 1 x daily - 7 x weekly - 3 sets - 10 reps - Seated Ankle Dorsiflexion with Resistance  - 1 x daily - 7 x weekly - 3 sets - 10 reps (with red  theraband)   PATIENT EDUCATION: Education details:  info on rubber quad tip for [facility] ([facility] or order online) - wife ordered [employer] SPC with quad tip from online at start of session Person educated: Patient and Spouse Education method: Explanation, demonstration Education comprehension: verbalized understanding and returned demonstration  HOME EXERCISE PROGRAM: See above  GOALS: Goals reviewed with patient? Yes  SHORT TERM GOALS: Target date: 01-07-23  Pt will be independent in HEP for LLE strengthening. Baseline: Goal status: Goal met 01-04-23  2.  Improve TUG score to </= 16 secs with SPC to demo improved mobility and decreased fall risk. Baseline: 22.50 secs with SPC;  14.25 secs with SPC on 01-04-23 Goal status: Goal met 6-4-241-4  3.  Amb. 67' with RW with CGA for increased safety with community ambulation. Baseline: 71' with SPC  Goal status: Goal met 01-04-23    4.  Pt will subjectively report increased ease with getting out of car  Baseline:  Goal status: Goal met 12-30-22  5.  Increase gait velocity to >/= 2.0 ft/sec with SPC for increased gait efficiency. Baseline: 19.06  secs = 1.72 ft/sec; 2.58 ft/sec with SPC on 01-06-23 Goal status: Goal met 01-06-23  6.  Complete Berg balance test Baseline: score 39/50 Goal status: Goal met 12-14-22  LONG TERM GOALS: Target date: 02-04-23  Improve Berg balance test score by at least 5 points to decrease fall risk. Baseline: TBA  Goal status: INITIAL  2.  Pt will amb. 350' with RW for increased community accessibility and for increased safety with amb. Baseline: pt using SPC - amb. Approx. 70' with unsteady gait pattern Goal status: INITIAL  3.  Pt will negotiate 4 steps with use of Rt hand rail with SBA using step over step sequence.  Baseline: TBA Goal status: INITIAL  4.  Amb. Short household distances, I.e. 20', without use of device modified independently.  Baseline: pt using SPC Goal status: INITIAL  5.  Increase FOTO score  to >/= 72/100 to demo improvement in functional status. Baseline: 56/100 Stroke LE Goal status: INITIAL  6.  Independent in updated HEP for LLE strengthening and balance. Baseline:  Goal status: INITIAL  ASSESSMENT:  CLINICAL IMPRESSION: Pt has met STG #5 with gait velocity increasing from 1.72 ft/sec to 2.58 ft/sec with use of SPC with rubber quad tip.  Pt demonstrates increased strength in LLE with improved clearance in swing phase of gait noted.  Decreased eccentric strength of Lt dorsiflexors noted with fatigue with Lt foot slap noted in stance phase of gait.  Pt is progressing well - plan D/C next week.  Cont with POC.    OBJECTIVE IMPAIRMENTS: Abnormal gait, cardiopulmonary status limiting activity, decreased balance, decreased endurance, decreased knowledge of use of DME, and decreased strength.   ACTIVITY LIMITATIONS: carrying, lifting, bending, standing, squatting, stairs, transfers, and locomotion level  PARTICIPATION LIMITATIONS: meal prep, cleaning, shopping, community activity, and church  PERSONAL FACTORS: Age, Past/current experiences, and 1-2 comorbidities: a-fib and h/o Lt femur fracture  are also affecting patient's functional outcome.   REHAB POTENTIAL: Good  CLINICAL DECISION MAKING: Evolving/moderate complexity  EVALUATION COMPLEXITY: Moderate  PLAN:  PT FREQUENCY: 2x/week  PT DURATION: 8 weeks (anticipate 6 week LOS)  PLANNED INTERVENTIONS: Therapeutic exercises, Therapeutic activity, Neuromuscular re-education, Balance training, Gait training, Patient/Family education, Self Care, Stair training, Orthotic/Fit training, and DME instructions  PLAN FOR NEXT SESSION:  10th visit progress note due next session:  continue LLE strengthening and SLS activities; gait train with SPC with quad tip   Jon Lall, Donavan Burnet, PT 01/07/2023, 5:24 PM

## 2023-01-07 ENCOUNTER — Encounter: Payer: Self-pay | Admitting: Physical Therapy

## 2023-01-11 ENCOUNTER — Ambulatory Visit: Payer: Medicare Other | Admitting: Occupational Therapy

## 2023-01-11 ENCOUNTER — Ambulatory Visit: Payer: Medicare Other | Admitting: Physical Therapy

## 2023-01-11 DIAGNOSIS — R278 Other lack of coordination: Secondary | ICD-10-CM | POA: Diagnosis not present

## 2023-01-11 DIAGNOSIS — M6281 Muscle weakness (generalized): Secondary | ICD-10-CM | POA: Diagnosis not present

## 2023-01-11 DIAGNOSIS — I69354 Hemiplegia and hemiparesis following cerebral infarction affecting left non-dominant side: Secondary | ICD-10-CM | POA: Diagnosis not present

## 2023-01-11 DIAGNOSIS — R2689 Other abnormalities of gait and mobility: Secondary | ICD-10-CM

## 2023-01-11 DIAGNOSIS — R2681 Unsteadiness on feet: Secondary | ICD-10-CM

## 2023-01-11 NOTE — Therapy (Signed)
OUTPATIENT PHYSICAL THERAPY NEURO TREATMENT NOTE/10TH VISIT PROGRESS NOTE   Progress Note Reporting Period 12-07-22 to 01-11-23.   See note below for Objective Data and Assessment of Progress/Goals.   Thank you for this referral, Kerry Fort, PT    Patient Name: Larry Briggs MRN: [id number] DOB:April 04, 1936, 87 y.o., male Today's Date: 01/12/2023   PCP: Merri Brunette, MD REFERRING PROVIDER: Lorin Glass, MD  END OF SESSION:  PT End of Session - 01/12/23 1441     Visit Number 10    Number of Visits 17    Date for PT Re-Evaluation 02/04/23   anticipate 6 week LOS   Authorization Type Medicare    Authorization Time Period 12-07-22 - 02-06-23    PT Start Time 1310    PT Stop Time 1358    PT Time Calculation (min) 48 min    Equipment Utilized During Treatment Gait belt    Activity Tolerance Patient tolerated treatment well    Behavior During Therapy WFL for tasks assessed/performed                   Past Medical History:  Diagnosis Date   CAD (coronary artery disease)    Remote MI in 1980. CABG 1995. Stent to left main in 2004. Last cath in April of 2012. EF 50%; patent LIMA to DX/LAD with collateralization of the distal right, patent SVG to OM, occluded SVG to distal RCA, and patent stent to LAD   History of heart attack 1980   Hypercholesteremia    Hypertension    Increased glucose level    Obesity    Past Surgical History:  Procedure Laterality Date   CARDIAC CATHETERIZATION  1993   CARDIAC CATHETERIZATION  April 2012   Mild reduction if EF at 50%. Patent LIMA to DX/LAD with collateralization to distal RCA, patent SVG to OM and occluded SVG to distal right and patent stent to LAD/Left main;   CORONARY ARTERY BYPASS GRAFT  05/1994   x5 LIMA to LAD & DX, SVG to LCX, SVG to RCA   CORONARY STENT PLACEMENT  2004   Stent to the L main   INTRAMEDULLARY (IM) NAIL INTERTROCHANTERIC Left 09/05/2017   Procedure: INTRAMEDULLARY (IM) NAIL INTERTROCHANTRIC;  Surgeon:  Sheral Apley, MD;  Location: [facility] OR;  Service: Orthopedics;  Laterality: Left;   Patient Active Problem List   Diagnosis Date Noted   CVA (cerebral vascular accident) (HCC) 12/05/2022   Pseudomonas aeruginosa infection 11/20/2019   Abrasion 07/30/2019   Medication management 07/03/2019   Abnormal findings on diagnostic imaging of lung 03/02/2019   New onset atrial fibrillation (HCC)    Hypokalemia    Acute blood loss anemia    Hip fracture (HCC) 09/04/2017   Cough 04/22/2016   COPD GOLD 4  04/22/2016   T wave inversion in EKG 05/10/2011   Hypertension    Arteriosclerotic cardiovascular disease (ASCVD)    Hypercholesteremia    Obesity    Ischemic heart disease    Increased glucose level     ONSET DATE: 12-04-22  REFERRING DIAG:  Diagnosis  I63.9 (ICD-10-CM) - Cerebrovascular accident (CVA), unspecified mechanism (HCC)    THERAPY DIAG:  Muscle weakness (generalized)  Other abnormalities of gait and mobility  Unsteadiness on feet  Rationale for Evaluation and Treatment: Rehabilitation  SUBJECTIVE:  SUBJECTIVE STATEMENT: Pt reports he is doing fine - no new problems or issues; continues to do his exercises at home; using his new SPC with rubber quad tip Pt accompanied by:  wife, [name]  PERTINENT HISTORY:   87 year old male with past medical history significant for hypertension, CAD, A-fib on Xarelto presents today for evaluation of weakness on the left side that started upon waking up this morning around 9 AM.  He states he went to bed last night feeling normal.  He states that initially started with decreased grip strength on the left, and then he noticed he was unable to walk normally throughout the day.  He feels that the grip strength has resolved.  Denies any difficulty with speech,  facial droop, vision issues, or balance issues.  He states he feels weaker on the left particularly in his left leg.  Denies history of CVA.;  h/o fracture in Lt femur in Feb. 2019 - was putting on pants, leg got caught in his pants and he lost his balance and fell  PAIN:  Are you having pain? No  PRECAUTIONS: Fall  WEIGHT BEARING RESTRICTIONS: No  FALLS: Has patient fallen in last 6 months? No  LIVING ENVIRONMENT: Lives with: lives with their spouse Lives in: House/apartment Stairs: Yes: External: 3 steps; on right going up Has following equipment at home: Single point cane  PLOF: Independent with basic ADLs, Independent with household mobility with device, Independent with community mobility with device, Independent with transfers, and Requires assistive device for independence  PATIENT GOALS: "be back close to where I was"; get Lt leg stronger   TODAY's TREATMENT:  01-11-23   GAIT: Gait pattern: step through pattern, decreased step length- Left, decreased hip/knee flexion- Left, decreased ankle dorsiflexion- Left, and lateral lean- Right Distance walked:  clinic distances with SPC with quad tip Assistive device utilized: SPC with rubber quad tip Level of assistance: SBA   Comments: see above   Step training - pt negotiated 4 steps using step over step sequence with Rt handrail with supervision   NEURORE-ED:    01/11/23 0001  Berg Balance Test  Sit to Stand 3  Standing Unsupported 4  Sitting with Back Unsupported but Feet Supported on Floor or Stool 4  Stand to Sit 4  Transfers 3  Standing Unsupported with Eyes Closed 4  Standing Unsupported with Feet Together 4  From Standing, Reach Forward with Outstretched Arm 4  From Standing Position, Pick up Object from Floor 4  From Standing Position, Turn to Look Behind Over each Shoulder 4  Turn 360 Degrees 2 (5.63 to Rt, 5.25 to Lt)  Standing Unsupported, Alternately Place Feet on Step/Stool 1  Standing Unsupported, One  Foot in Front 3  Standing on One Leg 1  Total Score 45     Tap ups to 1st step, 2nd step, 3rd step 5 reps to each step for Lt hip flexor strengthening - bil. UE support used on handrails; pt performed tap ups to 2nd step 10 reps with RLE for improved SLS on LLE with 1 UE support on rails  Colored discs used for targets (3)   - inside // bars for UE support prn - pt performed 5 reps to the 3 discs with each leg with CGA to improve SLS on each leg; then 5 reps diagonal taps with each foot to each disc with minimal UE support; touched each disc directly in front 3 reps each foot without UE support    Rockerboard - 10  reps x 2 sets anterior/posterior weight shifts with min. UE support on // bars  Pt performed walking forwards/backwards 10' x 2 reps inside // bars (40' total) without UE support for balance training; pt performed sideways ambulation inside // bars 10' x 2 reps inside // bars without UE support with SBA - pt had no LOB with this activity   THEREX:  Step ups LLE 10 reps no weight used - to 6" step with bil. UE support (cues to not pull up with arms)  Sidestepping with squats 10' x 2 reps inside // bars with min. UE support on // bars  Leg press seat at 14:  70# 20 reps bil. LE's:    LLE only 40# 2 sets 10 reps:     UPDATED HEP (12-21-22):  Medbridge Access Code: [email or web address] URL: [email or web address] Date: 12/22/2022 Prepared by: Maebelle Munroe  Exercises - Sit to Stand with Counter Support  - 1 x daily - 7 x weekly - 1 sets - 10 reps - Supine Bridge with Leg Extension  - 1 x daily - 7 x weekly - 1 sets - 10 reps - Supine Heel Slide  - 1 x daily - 7 x weekly - 1 sets - 10 reps - Clamshell with Resistance  - 1 x daily - 7 x weekly - 1 sets - 10 reps - 5 sec hold - Seated Shoulder Flexion with Dumbbells  - 1 x daily - 7 x weekly - 1 sets - 10 reps - Seated Shoulder Abduction with Dumbbells - Thumbs Up  - 1 x daily - 7 x weekly - 1 sets - 10 reps - Seated Single Arm  Bicep Curls Supinated with Dumbbell  - 1 x daily - 7 x weekly - 1 sets - 10 reps                                                                                                                            DATE: 12-07-22 (HEP instruction during eval session)  Access Code: [email or web address] URL: [email or web address] Date: 12/08/2022 Prepared by: Maebelle Munroe  Exercises - Sidelying Hip Abduction  - 1 x daily - 7 x weekly - 1 sets - 10 reps - Active Straight Leg Raise with Quad Set  - 1 x daily - 7 x weekly - 1 sets - 10 reps - Standing Hip Extension  - 1 x daily - 7 x weekly - 3 sets - 10 reps - Seated Ankle Dorsiflexion with Resistance  - 1 x daily - 7 x weekly - 3 sets - 10 reps (with red theraband)   PATIENT EDUCATION: Education details:  01-11-23 - discussed progress towards LTG's and D/C planned for this week.  Also recommended to pt that he continue with HEP at least 3-4x/week for maintenance of current strength gains.  Person educated: Patient and Spouse Education method: Explanation, demonstration Education comprehension: verbalized understanding and returned demonstration  HOME EXERCISE PROGRAM: See above  GOALS: Goals reviewed with  patient? Yes  SHORT TERM GOALS: Target date: 01-07-23  Pt will be independent in HEP for LLE strengthening. Baseline: Goal status: Goal met 01-04-23  2.  Improve TUG score to </= 16 secs with SPC to demo improved mobility and decreased fall risk. Baseline: 22.50 secs with SPC;  14.25 secs with SPC on 01-04-23 Goal status: Goal met 6-4-241-4  3.  Amb. 67' with RW with CGA for increased safety with community ambulation. Baseline: 60' with SPC  Goal status: Goal met 01-04-23    4.  Pt will subjectively report increased ease with getting out of car Baseline:  Goal status: Goal met 12-30-22  5.  Increase gait velocity to >/= 2.0 ft/sec with SPC for increased gait efficiency. Baseline: 19.06  secs = 1.72 ft/sec; 2.58 ft/sec with SPC on  01-06-23 Goal status: Goal met 01-06-23  6.  Complete Berg balance test Baseline: score 39/50 Goal status: Goal met 12-14-22  LONG TERM GOALS: Target date: 02-04-23  Improve Berg balance test score by at least 5 points to decrease fall risk. Baseline: TBA ; 6 point increase 45/56 (initial score 39/56) Goal status: Goal met 01-11-23  2.  Pt will amb. 350' with RW for increased community accessibility and for increased safety with amb. Baseline: pt using SPC - amb. Approx. 28' with unsteady gait pattern Goal status: INITIAL  3.  Pt will negotiate 4 steps with use of Rt hand rail with SBA using step over step sequence.  Baseline: TBA Goal status: Goal met 01-11-23  4.  Amb. Short household distances, I.e. 20', without use of device modified independently.  Baseline: pt using SPC Goal status: Goal met 01-11-23  5.  Increase FOTO score to >/= 72/100 to demo improvement in functional status. Baseline: 56/100 Stroke LE Goal status: INITIAL  6.  Independent in updated HEP for LLE strengthening and balance. Baseline:  Goal status: Goal met 01-11-23  ASSESSMENT:  CLINICAL IMPRESSION: This 10th visit progress note covers dates 12-07-22 - 01-11-23.  Pt has met LTG's #1 (Berg balance test score has increased from 39/56 to 45/56), and LTG's #3, 4 and 6.  Pt has progressed from use of rollator for assistance with ambulation to use of SPC with rubber quad base.  Pt is able to amb. Short distances in the home without use of SPC.  Pt has made excellent progress in PT and has closely returned to prior functional level.  Plan D/C at next session.  Cont with POC.    OBJECTIVE IMPAIRMENTS: Abnormal gait, cardiopulmonary status limiting activity, decreased balance, decreased endurance, decreased knowledge of use of DME, and decreased strength.   ACTIVITY LIMITATIONS: carrying, lifting, bending, standing, squatting, stairs, transfers, and locomotion level  PARTICIPATION LIMITATIONS: meal prep, cleaning,  shopping, community activity, and church  PERSONAL FACTORS: Age, Past/current experiences, and 1-2 comorbidities: a-fib and h/o Lt femur fracture  are also affecting patient's functional outcome.   REHAB POTENTIAL: Good  CLINICAL DECISION MAKING: Evolving/moderate complexity  EVALUATION COMPLEXITY: Moderate  PLAN:  PT FREQUENCY: 2x/week  PT DURATION: 8 weeks (anticipate 6 week LOS)  PLANNED INTERVENTIONS: Therapeutic exercises, Therapeutic activity, Neuromuscular re-education, Balance training, Gait training, Patient/Family education, Self Care, Stair training, Orthotic/Fit training, and DME instructions  PLAN FOR NEXT SESSION:  D/C next session - check LTG's #2 and 5:  continue LLE strengthening and SLS activities; gait train with SPC with quad tip   Kingstyn Deruiter, Donavan Burnet, PT 01/12/2023, 3:05 PM

## 2023-01-12 ENCOUNTER — Encounter: Payer: Self-pay | Admitting: Physical Therapy

## 2023-01-12 NOTE — Progress Notes (Signed)
   01/11/23 0001  Berg Balance Test  Sit to Stand 3  Standing Unsupported 4  Sitting with Back Unsupported but Feet Supported on Floor or Stool 4  Stand to Sit 4  Transfers 3  Standing Unsupported with Eyes Closed 4  Standing Unsupported with Feet Together 4  From Standing, Reach Forward with Outstretched Arm 4  From Standing Position, Pick up Object from Floor 4  From Standing Position, Turn to Look Behind Over each Shoulder 4  Turn 360 Degrees 2 (5.63 to Rt, 5.25 to Lt)  Standing Unsupported, Alternately Place Feet on Step/Stool 1  Standing Unsupported, One Foot in Front 3  Standing on One Leg 1  Total Score 45

## 2023-01-13 ENCOUNTER — Ambulatory Visit: Payer: Medicare Other | Admitting: Physical Therapy

## 2023-01-13 ENCOUNTER — Encounter: Payer: Medicare Other | Admitting: Occupational Therapy

## 2023-01-13 DIAGNOSIS — I69354 Hemiplegia and hemiparesis following cerebral infarction affecting left non-dominant side: Secondary | ICD-10-CM | POA: Diagnosis not present

## 2023-01-13 DIAGNOSIS — R2681 Unsteadiness on feet: Secondary | ICD-10-CM | POA: Diagnosis not present

## 2023-01-13 DIAGNOSIS — M6281 Muscle weakness (generalized): Secondary | ICD-10-CM

## 2023-01-13 DIAGNOSIS — R278 Other lack of coordination: Secondary | ICD-10-CM | POA: Diagnosis not present

## 2023-01-13 DIAGNOSIS — R2689 Other abnormalities of gait and mobility: Secondary | ICD-10-CM

## 2023-01-13 NOTE — Therapy (Signed)
OUTPATIENT PHYSICAL THERAPY NEURO TREATMENT NOTE/DISCHARGE SUMMARY    Patient Name: Larry Briggs MRN: [id number] DOB:[birth year], 87 y.o., male Today's Date: 01/14/2023   PCP: Merri Brunette, MD REFERRING PROVIDER: Lorin Glass, MD  END OF SESSION:  PT End of Session - 01/14/23 1120     Visit Number 11    Number of Visits 17    Date for PT Re-Evaluation 02/04/23   anticipate 6 week LOS   Authorization Type Medicare    Authorization Time Period 12-07-22 - 02-06-23    PT Start Time 1448    PT Stop Time 1532    PT Time Calculation (min) 44 min    Equipment Utilized During Treatment Gait belt    Activity Tolerance Patient tolerated treatment well    Behavior During Therapy WFL for tasks assessed/performed                    Past Medical History:  Diagnosis Date   CAD (coronary artery disease)    Remote MI in 1980. CABG 1995. Stent to left main in 2004. Last cath in April of 2012. EF 50%; patent LIMA to DX/LAD with collateralization of the distal right, patent SVG to OM, occluded SVG to distal RCA, and patent stent to LAD   History of heart attack 1980   Hypercholesteremia    Hypertension    Increased glucose level    Obesity    Past Surgical History:  Procedure Laterality Date   CARDIAC CATHETERIZATION  1993   CARDIAC CATHETERIZATION  April 2012   Mild reduction if EF at 50%. Patent LIMA to DX/LAD with collateralization to distal RCA, patent SVG to OM and occluded SVG to distal right and patent stent to LAD/Left main;   CORONARY ARTERY BYPASS GRAFT  05/1994   x5 LIMA to LAD & DX, SVG to LCX, SVG to RCA   CORONARY STENT PLACEMENT  2004   Stent to the L main   INTRAMEDULLARY (IM) NAIL INTERTROCHANTERIC Left 09/05/2017   Procedure: INTRAMEDULLARY (IM) NAIL INTERTROCHANTRIC;  Surgeon: Sheral Apley, MD;  Location: [facility] OR;  Service: Orthopedics;  Laterality: Left;   Patient Active Problem List   Diagnosis Date Noted   CVA (cerebral vascular accident) (HCC)  12/05/2022   Pseudomonas aeruginosa infection 11/20/2019   Abrasion 07/30/2019   Medication management 07/03/2019   Abnormal findings on diagnostic imaging of lung 03/02/2019   New onset atrial fibrillation (HCC)    Hypokalemia    Acute blood loss anemia    Hip fracture (HCC) 09/04/2017   Cough 04/22/2016   COPD GOLD 4  04/22/2016   T wave inversion in EKG 05/10/2011   Hypertension    Arteriosclerotic cardiovascular disease (ASCVD)    Hypercholesteremia    Obesity    Ischemic heart disease    Increased glucose level     ONSET DATE: 12-04-22  REFERRING DIAG:  Diagnosis  I63.9 (ICD-10-CM) - Cerebrovascular accident (CVA), unspecified mechanism (HCC)    THERAPY DIAG:  Muscle weakness (generalized)  Other abnormalities of gait and mobility  Unsteadiness on feet  Rationale for Evaluation and Treatment: Rehabilitation  SUBJECTIVE:  SUBJECTIVE STATEMENT: Pt reports no changes or issues - says he is ready to finish up today -plans to continue to do his exercises at least 3 times/week to maintain his current strength; pt asks about using a stress ball to squeeze to increase Lt hand grip strength - discussed this with OT, Amada Kingfisher, who did his OT eval - she recommended using green theraputty Pt accompanied by:  wife, [name]  PERTINENT HISTORY:   87 year old male with past medical history significant for hypertension, CAD, A-fib on Xarelto presents today for evaluation of weakness on the left side that started upon waking up this morning around 9 AM.  He states he went to bed last night feeling normal.  He states that initially started with decreased grip strength on the left, and then he noticed he was unable to walk normally throughout the day.  He feels that the grip strength has resolved.  Denies  any difficulty with speech, facial droop, vision issues, or balance issues.  He states he feels weaker on the left particularly in his left leg.  Denies history of CVA.;  h/o fracture in Lt femur in Feb. 2019 - was putting on pants, leg got caught in his pants and he lost his balance and fell  PAIN:  Are you having pain? No  PRECAUTIONS: Fall  WEIGHT BEARING RESTRICTIONS: No  FALLS: Has patient fallen in last 6 months? No  LIVING ENVIRONMENT: Lives with: lives with their spouse Lives in: House/apartment Stairs: Yes: External: 3 steps; on right going up Has following equipment at home: Single point cane  PLOF: Independent with basic ADLs, Independent with household mobility with device, Independent with community mobility with device, Independent with transfers, and Requires assistive device for independence  PATIENT GOALS: "be back close to where I was"; get Lt leg stronger   TODAY's TREATMENT:  01-13-23   GAIT: Gait pattern: step through pattern, decreased step length- Left, decreased hip/knee flexion- Left, decreased ankle dorsiflexion- Left, and lateral lean- Right Distance walked:  350' with [facility]  Assistive device utilized: [facility] with rubber quad tip Level of assistance: SBA   Comments: no LOB and no occurrence of Lt foot slap in stance phase of gait   Step training - pt negotiated 4 steps using step over step sequence with Rt handrail with supervision   NEURORE-ED:  Sit to stand from mat - RLE placed on 4" step for increased weight shift onto LLE during transfer;  pt performed 5 reps x 2 sets with CGA to min assist  Tap ups to 1st step x 5 reps and then to 2nd step 5 reps with RLE to improve SLS on LLE - min UE support on hand rail used for assist with balance   THEREX:  Step ups LLE 10 reps no weight used - to 6" step with bil. UE support (cues to not pull up with arms)  Leg Press:   70# 10 reps:  80# 20 reps bil. LE's:  LLE only 40#  3 sets 10 reps   Pt completed  FOTO at end of session  - score 57.5  UPDATED HEP (12-21-22):  Medbridge Access Code: [email or web address] URL: [email or web address] Date: 12/22/2022 Prepared by: Maebelle Munroe  Exercises - Sit to Stand with Counter Support  - 1 x daily - 7 x weekly - 1 sets - 10 reps - Supine Bridge with Leg Extension  - 1 x daily - 7 x weekly - 1 sets - 10 reps - Supine Heel Slide  -  1 x daily - 7 x weekly - 1 sets - 10 reps - Clamshell with Resistance  - 1 x daily - 7 x weekly - 1 sets - 10 reps - 5 sec hold - Seated Shoulder Flexion with Dumbbells  - 1 x daily - 7 x weekly - 1 sets - 10 reps - Seated Shoulder Abduction with Dumbbells - Thumbs Up  - 1 x daily - 7 x weekly - 1 sets - 10 reps - Seated Single Arm Bicep Curls Supinated with Dumbbell  - 1 x daily - 7 x weekly - 1 sets - 10 reps                                                                                                                            DATE: 12-07-22 (HEP instruction during eval session)  Access Code: [email or web address] URL: [email or web address] Date: 12/08/2022 Prepared by: Maebelle Munroe  Exercises - Sidelying Hip Abduction  - 1 x daily - 7 x weekly - 1 sets - 10 reps - Active Straight Leg Raise with Quad Set  - 1 x daily - 7 x weekly - 1 sets - 10 reps - Standing Hip Extension  - 1 x daily - 7 x weekly - 3 sets - 10 reps - Seated Ankle Dorsiflexion with Resistance  - 1 x daily - 7 x weekly - 3 sets - 10 reps (with red theraband)   PATIENT EDUCATION: Education details:  01-13-23: reviewed HEP verbally - pt to continue HEP at least 3x/week for maintenance; instructed in use of putty exercise for Lt grip strength (provided by Amada Kingfisher, OT); p Person educated: Patient and Spouse Education method: Explanation, demonstration Education comprehension: verbalized understanding and returned demonstration  HOME EXERCISE PROGRAM: See above  GOALS: Goals reviewed with patient? Yes  SHORT TERM GOALS: Target date:  01-07-23  Pt will be independent in HEP for LLE strengthening. Baseline: Goal status: Goal met 01-04-23  2.  Improve TUG score to </= 16 secs with SPC to demo improved mobility and decreased fall risk. Baseline: 22.50 secs with SPC;  14.25 secs with SPC on 01-04-23 Goal status: Goal met 6-4-241-4  3.  Amb. 52' with RW with CGA for increased safety with community ambulation. Baseline: 59' with SPC  Goal status: Goal met 01-04-23    4.  Pt will subjectively report increased ease with getting out of car Baseline:  Goal status: Goal met 12-30-22  5.  Increase gait velocity to >/= 2.0 ft/sec with SPC for increased gait efficiency. Baseline: 19.06  secs = 1.72 ft/sec; 2.58 ft/sec with SPC on 01-06-23 Goal status: Goal met 01-06-23  6.  Complete Berg balance test Baseline: score 39/50 Goal status: Goal met 12-14-22  LONG TERM GOALS: Target date: 02-04-23  Improve Berg balance test score by at least 5 points to decrease fall risk. Baseline: TBA ; 6 point increase 45/56 (initial score 39/56) Goal status: Goal met 01-11-23  2.  Pt will  amb. 350' with RW for increased community accessibility and for increased safety with amb. Baseline: pt using SPC - amb. Approx. 74' with unsteady gait pattern;   350' with SPC with SBA on 01-13-23 Goal status: Goal met 01-13-23  3.  Pt will negotiate 4 steps with use of Rt hand rail with SBA using step over step sequence.  Baseline: TBA Goal status: Goal met 01-11-23  4.  Amb. Short household distances, I.e. 20', without use of device modified independently.  Baseline: pt using SPC Goal status: Goal met 01-11-23  5.  Increase FOTO score to >/= 72/100 to demo improvement in functional status. Baseline: 56/100 Stroke LE;   01-13-23 score 57.5  Goal status:  Partially met 01-13-23  6.  Independent in updated HEP for LLE strengthening and balance. Baseline:  Goal status: Goal met 01-11-23  ASSESSMENT:  CLINICAL IMPRESSION: Pt has met LTG's #1-4 and #6:  LTG #5  (FOTO) score has only increased from 56 to 57.5.  Pt's functional status is impacted by his COPD which significantly limits his endurance/stamina and activity tolerance.  Pt has made excellent gains in improving balance with Berg score increasing from 39/56 to 45/56, increasing LLE strength, and improved gait with pt progressing from use of rollator to [facility] with rubber quad base.  Pt agrees with progress towards goals and feels ready for D/C.    OBJECTIVE IMPAIRMENTS: Abnormal gait, cardiopulmonary status limiting activity, decreased balance, decreased endurance, decreased knowledge of use of DME, and decreased strength.   ACTIVITY LIMITATIONS: carrying, lifting, bending, standing, squatting, stairs, transfers, and locomotion level  PARTICIPATION LIMITATIONS: meal prep, cleaning, shopping, community activity, and church  PERSONAL FACTORS: Age, Past/current experiences, and 1-2 comorbidities: a-fib and h/o Lt femur fracture  are also affecting patient's functional outcome.   REHAB POTENTIAL: Good  CLINICAL DECISION MAKING: Evolving/moderate complexity  EVALUATION COMPLEXITY: Moderate  PLAN:  PT FREQUENCY: 2x/week  PT DURATION: 8 weeks (anticipate 6 week LOS)  PLANNED INTERVENTIONS: Therapeutic exercises, Therapeutic activity, Neuromuscular re-education, Balance training, Gait training, Patient/Family education, Self Care, Stair training, Orthotic/Fit training, and DME instructions  PLAN FOR NEXT SESSION: N/A - D/C on 01-13-23   PHYSICAL THERAPY DISCHARGE SUMMARY  Visits from Start of Care: 11  Current functional level related to goals / functional outcomes: See above for progress towards goals - pt has made excellent progress   Remaining deficits: Continued decreased standing balance and decreased independence with gait with pt needing use of SPC for community ambulation Cont. Decreased endurance (due to COPD)   Education / Equipment: Pt has been instructed in HEP for UE and LE  strengthening and also in balance exercises:  pt reports compliance with this HEP.    Patient agrees to discharge. Patient goals were met. Patient is being discharged due to meeting the stated rehab goals.   No further needs identified at this time.     Kary Kos, PT 01/14/2023, 11:23 AM

## 2023-01-14 ENCOUNTER — Encounter: Payer: Self-pay | Admitting: Physical Therapy

## 2023-01-18 ENCOUNTER — Encounter: Payer: Medicare Other | Admitting: Occupational Therapy

## 2023-01-18 ENCOUNTER — Ambulatory Visit: Payer: Medicare Other | Admitting: Physical Therapy

## 2023-01-20 ENCOUNTER — Encounter: Payer: Medicare Other | Admitting: Occupational Therapy

## 2023-01-20 ENCOUNTER — Ambulatory Visit: Payer: Medicare Other | Admitting: Physical Therapy

## 2023-02-15 ENCOUNTER — Other Ambulatory Visit: Payer: Self-pay | Admitting: Pulmonary Disease

## 2023-02-15 ENCOUNTER — Inpatient Hospital Stay: Payer: Medicare Other | Admitting: Family Medicine

## 2023-02-24 ENCOUNTER — Other Ambulatory Visit: Payer: Self-pay | Admitting: Cardiology

## 2023-02-24 DIAGNOSIS — I10 Essential (primary) hypertension: Secondary | ICD-10-CM | POA: Diagnosis not present

## 2023-02-24 DIAGNOSIS — I4891 Unspecified atrial fibrillation: Secondary | ICD-10-CM

## 2023-02-24 DIAGNOSIS — R7309 Other abnormal glucose: Secondary | ICD-10-CM | POA: Diagnosis not present

## 2023-02-24 LAB — LAB REPORT - SCANNED: A1c: 5.3

## 2023-02-24 NOTE — Telephone Encounter (Signed)
Xarelto 15mg  refill request received. Pt is 87 years old, weight-70.6kg, Crea-1.19 on 12/05/22, last seen by Dr. [place] on 08/27/22, Diagnosis-Afib, CrCl-46.14 mL/min; Dose is appropriate based on dosing criteria. Will send in refill to requested pharmacy.

## 2023-03-01 DIAGNOSIS — I259 Chronic ischemic heart disease, unspecified: Secondary | ICD-10-CM | POA: Diagnosis not present

## 2023-03-01 DIAGNOSIS — I739 Peripheral vascular disease, unspecified: Secondary | ICD-10-CM | POA: Diagnosis not present

## 2023-03-01 DIAGNOSIS — I10 Essential (primary) hypertension: Secondary | ICD-10-CM | POA: Diagnosis not present

## 2023-03-01 DIAGNOSIS — I4891 Unspecified atrial fibrillation: Secondary | ICD-10-CM | POA: Diagnosis not present

## 2023-03-01 DIAGNOSIS — E790 Hyperuricemia without signs of inflammatory arthritis and tophaceous disease: Secondary | ICD-10-CM | POA: Diagnosis not present

## 2023-03-01 DIAGNOSIS — D692 Other nonthrombocytopenic purpura: Secondary | ICD-10-CM | POA: Diagnosis not present

## 2023-03-01 DIAGNOSIS — I700 Atherosclerosis of aorta: Secondary | ICD-10-CM | POA: Diagnosis not present

## 2023-03-01 DIAGNOSIS — Z0000 Encounter for general adult medical examination without abnormal findings: Secondary | ICD-10-CM | POA: Diagnosis not present

## 2023-03-01 DIAGNOSIS — N1831 Chronic kidney disease, stage 3a: Secondary | ICD-10-CM | POA: Diagnosis not present

## 2023-03-01 DIAGNOSIS — E1121 Type 2 diabetes mellitus with diabetic nephropathy: Secondary | ICD-10-CM | POA: Diagnosis not present

## 2023-03-01 DIAGNOSIS — J449 Chronic obstructive pulmonary disease, unspecified: Secondary | ICD-10-CM | POA: Diagnosis not present

## 2023-03-03 ENCOUNTER — Encounter: Payer: Self-pay | Admitting: Physician Assistant

## 2023-03-03 ENCOUNTER — Ambulatory Visit: Payer: Medicare Other | Attending: Physician Assistant | Admitting: Physician Assistant

## 2023-03-03 VITALS — BP 132/64 | HR 89 | Ht 70.0 in | Wt 154.6 lb

## 2023-03-03 DIAGNOSIS — I25810 Atherosclerosis of coronary artery bypass graft(s) without angina pectoris: Secondary | ICD-10-CM | POA: Insufficient documentation

## 2023-03-03 DIAGNOSIS — Z8673 Personal history of transient ischemic attack (TIA), and cerebral infarction without residual deficits: Secondary | ICD-10-CM | POA: Insufficient documentation

## 2023-03-03 DIAGNOSIS — I480 Paroxysmal atrial fibrillation: Secondary | ICD-10-CM | POA: Insufficient documentation

## 2023-03-03 DIAGNOSIS — I4891 Unspecified atrial fibrillation: Secondary | ICD-10-CM

## 2023-03-03 DIAGNOSIS — I10 Essential (primary) hypertension: Secondary | ICD-10-CM | POA: Diagnosis not present

## 2023-03-03 MED ORDER — RIVAROXABAN 20 MG PO TABS: 20.0000 mg | ORAL_TABLET | Freq: Every day | ORAL | 0 refills | Status: DC

## 2023-03-03 NOTE — Progress Notes (Signed)
Cardiology Office Note:  .   Date:  03/05/2023  ID:  Larry Briggs, DOB 1936/07/02, MRN [id number] PCP: Merri Brunette, MD  [facility] Providers Cardiologist:  Peter Swaziland, MD     History of Present Illness: .   Larry Briggs is a 87 y.o. male with PMH of HTN, HLD, severe COPD, atrial fibrillation, and CAD s/p CABG 1995.  He underwent stenting of his left main in 2004.  Last cardiac catheterization in April 2012 showed patent stents and grafts except for SVG to distal RCA.  PDA was supplied by left-to-right collaterals.  He had side effect on blood pressure medication which improved after discontinuation of lisinopril and doxazosin and initiation of valsartan.  His valsartan was later switched to irbesartan, this was later discontinued as well.  Patient underwent intramedullary nailing for hip fracture in February 2019.  EKG at the time showed a new T wave inversion in the anterolateral leads.  However he did not have any anginal symptom.  Echocardiogram in February 2019 showed EF 50 to 55%, grade 1 DD, trivial pericardial effusion.  He went into atrial fibrillation with RVR during the hospitalization but was not placed on systemic anticoagulation therapy due to significant bleeding risk.  He subsequently self converted to sinus rhythm.  Subsequent 30-day heart monitor showed recurrence of atrial fibrillation therefore Xarelto was started.  Aspirin discontinued.  He had bradycardia in 2022 with heart rate down to the 40s, metoprolol was reduced.  Event monitor at the time showed PVCs but no significant bradycardia and no A-fib.  He was last seen by Dr. Swaziland in January 2024 at which time he was doing well. His COPD has improved on Trelegy.  Since the last visit, patient was admitted in May 2024 with left hand weakness.  MRI showed small acute infarct without posterior right frontal lobe white matter and cortex measuring up to 9 mm.  MRA did not show evidence of intracranial large vessel  occlusion or significant stenosis.  This has occurred after his Xarelto was reduced from 20 mg down to 15 mg daily and that the patient was not taking the medication consistently every day.  Simvastatin was increased to 40 mg daily.  Patient presents today for follow-up.  I discussed his case with our clinical pharmacist.  Based on the recent blood work and calculated GFR, I believe the patient should be on 20 mg daily of Xarelto instead.  I will give him some samples and write a new prescription.  He has significant family history of stroke as well, 20 mg of Xarelto will lower the risk of stroke.  Otherwise he denies any chest pain or shortness of breath.  He was given vitamin B12 since the last discharge, he really does not like to take vitamin B12.  I asked him to discuss this with Dr. Renne Crigler who can monitor his B12 level after he discontinued the medication.  He reported high blood pressure during recent visit with Dr. Renne Crigler, however his blood pressure has since normalized.  I recommended continue on the current therapy.  ROS:   He denies chest pain, palpitations, dyspnea, pnd, orthopnea, n, v, dizziness, syncope, edema, weight gain, or early satiety. All other systems reviewed and are otherwise negative except as noted above.    Studies Reviewed: .         Cardiac Studies & Procedures       ECHOCARDIOGRAM  ECHOCARDIOGRAM COMPLETE 09/07/2017  Narrative *[facility]* *[facility]* [address] [place], [place] [address] [phone]  ------------------------------------------------------------------- Transthoracic Echocardiography  Patient:    Larry Briggs MR #:       [id number] Study Date: 09/07/2017 Gender:     M Age:        55 Height:     177.8 cm Weight:     82.8 kg BSA:        2.03 m^2 Pt. Status: Room:       6N03C  SONOGRAPHER  Roosvelt Maser, RDCS ATTENDING    Rolland Porter [id number] PERFORMING   [facility], Inpatient ORDERING     Nelva Nay Delphina Cahill, Horatio Pel Tublu  cc:  ------------------------------------------------------------------- LV EF: 50% -   55%  ------------------------------------------------------------------- Study Conclusions  - Left ventricle: The cavity size was normal. Wall thickness was increased in a pattern of mild LVH. Systolic function was normal. The estimated ejection fraction was in the range of 50% to 55%. Doppler parameters are consistent with abnormal left ventricular relaxation (grade 1 diastolic dysfunction). - Aortic valve: Mildly calcified annulus. - Right atrium: The atrium was mildly dilated. - Pericardium, extracardiac: A trivial pericardial effusion was identified.  Impressions:  - Technically difficult echo Image quality is poor.  ------------------------------------------------------------------- Study data:  No prior study was available for comparison.  Study status:  Routine.  Procedure:  The patient reported no pain pre or post test. Transthoracic echocardiography. Image quality was adequate.  Study completion:  There were no complications. Transthoracic echocardiography.  M-mode, complete 2D, spectral Doppler, and color Doppler.  Birthdate:  Patient birthdate: 1936-06-29.  Age:  Patient is 87 yr old.  Sex:  Gender: male. BMI: 26.2 kg/m^2.  Blood pressure:     95/61  Patient status: Inpatient.  Study date:  Study date: 09/07/2017. Study time: 02:33 PM.  -------------------------------------------------------------------  ------------------------------------------------------------------- Left ventricle:  The cavity size was normal. Wall thickness was increased in a pattern of mild LVH. Systolic function was normal. The estimated ejection fraction was in the range of 50% to 55%. Doppler parameters are consistent with abnormal left ventricular relaxation (grade 1 diastolic  dysfunction).  ------------------------------------------------------------------- Aortic valve:   Mildly calcified annulus.  Doppler:   There was no stenosis.  ------------------------------------------------------------------- Aorta:  Aortic root: The aortic root was normal in size. Ascending aorta: The ascending aorta was normal in size.  ------------------------------------------------------------------- Mitral valve:   Structurally normal valve.   Leaflet separation was normal.  Doppler:  Transvalvular velocity was within the normal range. There was no evidence for stenosis. There was no regurgitation.  ------------------------------------------------------------------- Left atrium:  The atrium was normal in size.  ------------------------------------------------------------------- Right ventricle:  The cavity size was normal. Systolic function was normal.  ------------------------------------------------------------------- Pulmonic valve:    The valve appears to be grossly normal. Doppler:  There was no significant regurgitation.  ------------------------------------------------------------------- Tricuspid valve:   The valve appears to be grossly normal. Doppler:  There was trivial regurgitation.  ------------------------------------------------------------------- Right atrium:  The atrium was mildly dilated.  ------------------------------------------------------------------- Pericardium:  A trivial pericardial effusion was identified.  ------------------------------------------------------------------- Systemic veins: Inferior vena cava: The vessel was normal in size. The respirophasic diameter changes were in the normal range (>= 50%), consistent with normal central venous pressure.  ------------------------------------------------------------------- Measurements  Left ventricle                          Value        Reference LV ID, ED, PLAX chordal          (  L)     29.4  mm     43 - 52 LV ID, ES, PLAX chordal         (L)     21    mm     23 - 38 LV fx shortening, PLAX chordal          29    %      >=29 LV PW thickness, ED                     13.8  mm     ---------- IVS/LV PW ratio, ED                     0.94         <=1.3 Stroke volume, 2D                       74    ml     ---------- Stroke volume/bsa, 2D                   36    ml/m^2 ----------  Ventricular septum                      Value        Reference IVS thickness, ED                       13    mm     ----------  LVOT                                    Value        Reference LVOT ID, S                              22    mm     ---------- LVOT area                               3.8   cm^2   ---------- LVOT peak velocity, S                   114   cm/s   ---------- LVOT mean velocity, S                   69.7  cm/s   ---------- LVOT VTI, S                             19.5  cm     ---------- LVOT peak gradient, S                   5     mm Hg  ----------  Aorta                                   Value        Reference Aortic root ID, ED                      37    mm     ----------  Left atrium                             Value        Reference LA ID, A-P, ES                          42    mm     ---------- LA ID/bsa, A-P                          2.06  cm/m^2 <=2.2 LA volume, S                            62.5  ml     ---------- LA volume/bsa, S                        30.7  ml/m^2 ---------- LA volume, ES, 1-p A4C                  80.8  ml     ---------- LA volume/bsa, ES, 1-p A4C              39.7  ml/m^2 ---------- LA volume, ES, 1-p A2C                  48.2  ml     ---------- LA volume/bsa, ES, 1-p A2C              23.7  ml/m^2 ----------  Right atrium                            Value        Reference RA ID, S-I, ES, A4C                     46.2  mm     34 - 49 RA area, ES, A4C                        19.5  cm^2   8.3 - 19.5 RA volume, ES, A/L                      65    ml      ---------- RA volume/bsa, ES, A/L                  32    ml/m^2 ----------  Systemic veins                          Value        Reference Estimated CVP                           3     mm Hg  ----------  Right ventricle                         Value        Reference RV ID, minor axis, ED, A4C base         38.5  mm     ---------- TAPSE  18.3  mm     ---------- RV s&', lateral, S                       15.3  cm/s   ----------  Legend: (L)  and  (H)  mark values outside specified reference range.  ------------------------------------------------------------------- Prepared and Electronically Authenticated by  Kristeen Miss, M.D. 2019-02-06T17:37:54    MONITORS  LONG TERM MONITOR (3-14 DAYS) 11/18/2020  Narrative  Normal sinus rhythm. average HR 83. predominantly first degree AV block with infrequent second degree AV block Mobitz type 1  Frequent PVCs with bigeminy and NSVT- longest episode 16 beats.  Brief runs of PAT. longest 6 beats.  No Afib seen  No sustained bradycardia or pauses.   Patch Wear Time:  14 days and 0 hours (2022-03-26T10:28:35-0400 to 2022-04-09T10:28:39-0400)  Patient had a min HR of 33 bpm, max HR of 279 bpm, and avg HR of 83 bpm. Predominant underlying rhythm was Sinus Rhythm. First Degree AV Block was present. 3700 Ventricular Tachycardia runs occurred, the run with the fastest interval lasting 5 beats with a max rate of 279 bpm, the longest lasting 16 beats with an avg rate of 152 bpm. 57 Supraventricular Tachycardia runs occurred, the run with the fastest interval lasting 6 beats with a max rate of 135 bpm, the longest lasting 13 beats with an avg rate of 90 bpm. Second Degree AV Block-Mobitz I (Wenckebach) was present. Ventricular Tachycardia was detected within +/- 45 seconds of symptomatic patient event(s). Isolated SVEs were rare (<1.0%), SVE Couplets were rare (<1.0%), and SVE Triplets were rare (<1.0%). Isolated VEs  were frequent (32.6%, [id number]), VE Couplets were occasional (4.1%, 33402), and VE Triplets were rare (<1.0%, 4517). Ventricular Bigeminy and Trigeminy were present.           Risk Assessment/Calculations:    CHA2DS2-VASc Score = 6   This indicates a 9.7% annual risk of stroke. The patient's score is based upon: CHF History: 0 HTN History: 1 Diabetes History: 0 Stroke History: 2 Vascular Disease History: 1 Age Score: 2 Gender Score: 0           Physical Exam:   VS:  BP 132/64 (BP Location: Left Arm, Patient Position: Sitting, Cuff Size: Normal)   Pulse 89   Ht 5\' 10"  (1.778 m)   Wt 154 lb 9.6 oz (70.1 kg)   SpO2 96%   BMI 22.18 kg/m    Wt Readings from Last 3 Encounters:  03/03/23 154 lb 9.6 oz (70.1 kg)  12/13/22 155 lb 9.6 oz (70.6 kg)  12/04/22 161 lb 6 oz (73.2 kg)    GEN: Well nourished, well developed in no acute distress NECK: No JVD; No carotid bruits CARDIAC: RRR, no murmurs, rubs, gallops RESPIRATORY:  Clear to auscultation without rales, wheezing or rhonchi  ABDOMEN: Soft, non-tender, non-distended EXTREMITIES:  No edema; No deformity   ASSESSMENT AND PLAN: .    PAF: Patient recently had stroke.  It was felt that he had a stroke because he was taking Xarelto inconsistently and may be off by several hours.  It appears his Xarelto was reduced to 15 mg around October of last year.  I think his renal function would qualify him for 20 mg daily of Xarelto.  I recommended increase the Xarelto to full dose.  CAD s/p CABG: Denies any chest pain.  H/o CVA: Will increase Xarelto to 20 mg daily.  Hypertension: Blood pressure stable.  Blood pressure was elevated during recent  office visit with Dr. Renne Crigler, however it is normal today.  I recommended continue current therapy.       Dispo: Follow-up with Dr. Swaziland in 6 months  Signed, Azalee Course, [place]

## 2023-03-03 NOTE — Patient Instructions (Addendum)
Medication Instructions:  You have been given 2 sample bottles of Xarelto 20mg  containing 14 tablets in all. Take by mouth daily with supper. *If you need a refill on your cardiac medications before your next appointment, please call your pharmacy*   Lab Work: none If you have labs (blood work) drawn today and your tests are completely normal, you will receive your results only by: MyChart Message (if you have MyChart) OR A paper copy in the mail If you have any lab test that is abnormal or we need to change your treatment, we will call you to review the results.   Testing/Procedures: none   Follow-Up: At [facility] you and your health needs are our priority.  As part of our continuing mission to provide you with exceptional heart care, we have created designated Provider Care Teams.  These Care Teams include your primary Cardiologist (physician) and Advanced Practice Providers (APPs -  Physician Assistants and Nurse Practitioners) who all work together to provide you with the care you need, when you need it.  We recommend signing up for the patient portal called "MyChart".  Sign up information is provided on this After Visit Summary.  MyChart is used to connect with patients for Virtual Visits (Telemedicine).  Patients are able to view lab/test results, encounter notes, upcoming appointments, etc.  Non-urgent messages can be sent to your provider as well.   To learn more about what you can do with MyChart, go to [email or web address].    Your next appointment:   6 month(s) A letter will be sent to you as a reminder to call the office for your 6 montn follow up with Dr. Swaziland  Provider:   Peter Swaziland, MD

## 2023-03-15 DIAGNOSIS — I10 Essential (primary) hypertension: Secondary | ICD-10-CM | POA: Diagnosis not present

## 2023-03-15 DIAGNOSIS — E538 Deficiency of other specified B group vitamins: Secondary | ICD-10-CM | POA: Diagnosis not present

## 2023-05-06 DIAGNOSIS — Z23 Encounter for immunization: Secondary | ICD-10-CM | POA: Diagnosis not present

## 2023-05-09 ENCOUNTER — Telehealth: Payer: Self-pay | Admitting: Pulmonary Disease

## 2023-05-09 NOTE — Telephone Encounter (Signed)
PT wife calling.  Should he get the Covid and RSV shots again this year. Please call to advise Her # is [phone]

## 2023-05-10 NOTE — Telephone Encounter (Signed)
Returned call and notified that RSV is every 2 years. Patient will only get covid vac per spouse.Nothing further needed.

## 2023-05-19 DIAGNOSIS — Z23 Encounter for immunization: Secondary | ICD-10-CM | POA: Diagnosis not present

## 2023-06-06 ENCOUNTER — Ambulatory Visit (INDEPENDENT_AMBULATORY_CARE_PROVIDER_SITE_OTHER): Payer: Medicare Other | Admitting: Pulmonary Disease

## 2023-06-06 ENCOUNTER — Encounter: Payer: Self-pay | Admitting: Pulmonary Disease

## 2023-06-06 VITALS — BP 138/81 | HR 71 | Ht 70.0 in | Wt 158.6 lb

## 2023-06-06 DIAGNOSIS — J449 Chronic obstructive pulmonary disease, unspecified: Secondary | ICD-10-CM | POA: Diagnosis not present

## 2023-06-06 NOTE — Progress Notes (Signed)
@Patient  ID: Larry Briggs, male    DOB: [birth year], 87 y.o.   MRN: [id number]  Chief Complaint  Patient presents with   Follow-up    6 month follow up    Referring provider: Merri Brunette, MD  HPI:   [name] is a 87 y.o. man with past medical history of tobacco abuse now in remission and severe (FEV1 30%) COPD here for follow-up.  Most recent Cardiology note reviewed.    Overall he is doing well.  No exacerbations since last visit.  None in the last 6 months.   Reports good adherence to Trelegy as well as azithromycin Monday Wednesday Friday. No complaints.  Questionaires / Pulmonary Flowsheets:   ACT:      No data to display          MMRC:     No data to display          Epworth:      No data to display          Tests:   FENO:  Lab Results  Component Value Date   NITRICOXIDE 30 06/01/2018    PFT:    Latest Ref Rng & Units 04/26/2016    8:35 AM  PFT Results  FVC-Pre L 2.20   FVC-Predicted Pre % 53   FVC-Post L 2.41   FVC-Predicted Post % 58   Pre FEV1/FVC % % 40   Post FEV1/FCV % % 37   FEV1-Pre L 0.89   FEV1-Predicted Pre % 30   FEV1-Post L 0.90   DLCO uncorrected ml/min/mmHg 13.54   DLCO UNC% % 41   DLCO corrected ml/min/mmHg 14.48   DLCO COR %Predicted % 44   DLVA Predicted % 53   TLC L 8.20   TLC % Predicted % 116   RV % Predicted % 191    Severe COPD with severely reduced DLCO on my interpretation.   WALK:      No data to display          Imaging: No results found.  Lab Results: Personally reviewed, no history of eosinophilia CBC    Component Value Date/Time   WBC 7.4 12/05/2022 0240   RBC 3.65 (L) 12/05/2022 0240   HGB 13.4 12/05/2022 0240   HGB 14.6 07/10/2020 1423   HCT 36.9 (L) 12/05/2022 0240   HCT 41.3 07/10/2020 1423   PLT 189 12/05/2022 0240   PLT 205 07/10/2020 1423   MCV 101.1 (H) 12/05/2022 0240   MCV 97 07/10/2020 1423   MCH 36.7 (H) 12/05/2022 0240   MCHC 36.3 (H) 12/05/2022 0240   RDW  12.4 12/05/2022 0240   RDW 13.8 07/10/2020 1423   LYMPHSABS 1.0 12/05/2022 0240   LYMPHSABS 1.2 07/10/2020 1423   MONOABS 0.5 12/05/2022 0240   EOSABS 0.1 12/05/2022 0240   EOSABS 0.2 07/10/2020 1423   BASOSABS 0.0 12/05/2022 0240   BASOSABS 0.0 07/10/2020 1423    BMET    Component Value Date/Time   NA 135 12/05/2022 0240   NA 139 07/10/2020 1423   K 3.0 (L) 12/05/2022 0240   CL 98 12/05/2022 0240   CO2 25 12/05/2022 0240   GLUCOSE 129 (H) 12/05/2022 0240   BUN 18 12/05/2022 0240   BUN 14 07/10/2020 1423   CREATININE 1.19 12/05/2022 0240   CREATININE 0.86 03/09/2016 0926   CALCIUM 8.7 (L) 12/05/2022 0240   GFRNONAA >60 12/05/2022 0240   GFRAA 77 07/10/2020 1423    BNP No results found for: "  BNP"  ProBNP No results found for: "PROBNP"  Specialty Problems       Pulmonary Problems   COPD GOLD 4     Quit smoking 1995 - Spirometry  04/26/16   FEV1 0.90 (30%)  Ratio 0.37  No resp to saba p ? Prior to study - 07/30/2019  After extensive coaching inhaler device,  effectiveness =    75% (short Ti)        Cough    Allergies  Allergen Reactions   Viagra [Sildenafil]     Other reaction(s): blue haze   Iodinated Contrast Media Nausea And Vomiting and Rash    Immunization History  Administered Date(s) Administered   Fluad Quad(high Dose 65+) 05/18/2019, 05/22/2021   Influenza Inj Mdck Quad Pf 04/20/2018   Influenza Split 04/16/2016   Influenza, High Dose Seasonal PF 05/17/2014, 05/16/2015, 05/02/2020, 05/14/2022   Influenza, Quadrivalent, Recombinant, Inj, Pf 05/16/2017, 05/01/2018, 05/11/2019, 05/02/2020   Influenza-Unspecified 05/26/2012, 04/20/2013   Moderna Sars-Cov-2 Peds vaccine 36yrs thru 32yrs 05/31/2022   PFIZER(Purple Top)SARS-COV-2 Vaccination 08/23/2019, 09/13/2019, 05/07/2020, 02/09/2021   Pneumococcal Conjugate-13 06/14/2014   Zoster Recombinant(Shingrix) 05/24/2018, 08/17/2018   Zoster, Live 06/07/2006    Past Medical History:  Diagnosis Date    CAD (coronary artery disease)    Remote MI in 1980. CABG 1995. Stent to left main in 2004. Last cath in April of 2012. EF 50%; patent LIMA to DX/LAD with collateralization of the distal right, patent SVG to OM, occluded SVG to distal RCA, and patent stent to LAD   History of heart attack 1980   Hypercholesteremia    Hypertension    Increased glucose level    Obesity     Tobacco History: Social History   Tobacco Use  Smoking Status Former   Current packs/day: 0.00   Average packs/day: 0.8 packs/day for 40.9 years (30.7 ttl pk-yrs)   Types: Cigarettes   Start date: 26   Quit date: 07/02/1994   Years since quitting: 28.9  Smokeless Tobacco Never   Counseling given: Not Answered   Continue to not smoke  Outpatient Encounter Medications as of 06/06/2023  Medication Sig   acetaminophen (TYLENOL) 500 MG tablet Take 500 mg by mouth at bedtime.   acetaminophen (TYLENOL) 650 MG CR tablet Take 650 mg by mouth every 8 (eight) hours as needed.   amLODipine (NORVASC) 5 MG tablet TAKE 1 TABLET EVERY DAY   azithromycin (ZITHROMAX) 250 MG tablet TAKE 1 TABLET 3 TIMES A WEEK ON MONDAY, WEDNESDAY, AND FRIDAY.   Bioflavonoid Products (ESTER-C) 500-550 MG TABS Take 500 mg by mouth daily.   chlorthalidone (HYGROTON) 25 MG tablet Take 1 tablet (25 mg total) by mouth daily.   docusate sodium (COLACE) 100 MG capsule Take 100 mg by mouth as needed.   doxylamine, Sleep, (UNISOM) 25 MG tablet Take 25 mg by mouth at bedtime as needed.   Fluticasone-Umeclidin-Vilant (TRELEGY ELLIPTA) 100-62.5-25 MCG/ACT AEPB Inhale 1 puff into the lungs daily.   guaiFENesin (MUCINEX) 600 MG 12 hr tablet Take by mouth 2 (two) times daily as needed.   metoprolol succinate (TOPROL-XL) 25 MG 24 hr tablet TAKE 1 TABLET EVERY DAY   nitroGLYCERIN (NITROSTAT) 0.4 MG SL tablet Place 1 tablet (0.4 mg total) under the tongue as needed.   Omega-3 Fatty Acids (FISH OIL) 500 MG CAPS Take 1 capsule by mouth daily at 6 (six) AM.    Polyethyl Glycol-Propyl Glycol (SYSTANE ULTRA OP) Apply 1 drop to eye 2 (two) times daily.   Respiratory Therapy  Supplies (FLUTTER) DEVI Use as directed   Rivaroxaban (XARELTO) 15 MG TABS tablet Take 1 tablet (15 mg total) by mouth daily with supper.   rivaroxaban (XARELTO) 20 MG TABS tablet Take 1 tablet (20 mg total) by mouth daily with supper.   silver sulfADIAZINE (SILVADENE) 1 % cream Apply 1 Application topically daily as needed.   tamsulosin (FLOMAX) 0.4 MG CAPS capsule Take 0.4 mg by mouth daily after supper.   VENTOLIN HFA 108 (90 Base) MCG/ACT inhaler INHALE 2 PUFFS BY MOUTH EVERY 4 HOURS AS NEEDED FOR WHEEZING FOR SHORTNESS OF BREATH   simvastatin (ZOCOR) 40 MG tablet Take 1 tablet (40 mg total) by mouth daily at 6 PM.   No facility-administered encounter medications on file as of 06/06/2023.      Physical Exam  BP 138/81   Pulse 71   Ht 5\' 10"  (1.778 m)   Wt 158 lb 9.6 oz (71.9 kg)   SpO2 95% Comment: RA  BMI 22.76 kg/m   Wt Readings from Last 5 Encounters:  06/06/23 158 lb 9.6 oz (71.9 kg)  03/03/23 154 lb 9.6 oz (70.1 kg)  12/13/22 155 lb 9.6 oz (70.6 kg)  12/04/22 161 lb 6 oz (73.2 kg)  08/27/22 161 lb 6.4 oz (73.2 kg)    BMI Readings from Last 5 Encounters:  06/06/23 22.76 kg/m  03/03/23 22.18 kg/m  12/13/22 22.33 kg/m  12/04/22 23.16 kg/m  08/27/22 23.16 kg/m     Physical Exam General: Sitting up in exam chair, no acute distress Eyes: EOMI, no icterus Respiratory: Distant sounds bilaterally, no wheeze or crackle Cardiovascular: Warm, regular rate Extremities: No lower extremity edema, warm   Assessment & Plan:   Dyspnea on exertion: Likely largely driven by severe COPD.  Stable.  Encourage increased exertional capacity as tolerated.  Severe FEV1 30% COPD, Gold category D: Prior frequent exacerbated.  Azithromycin low-dose Monday Wednesday Friday added 11/2020.  No further exacerbations in interim.  To continue this.  To continue Trelegy.      Return in about 6 months (around 12/04/2023) for f/u Dr. Judeth Horn.   Karren Burly, MD 06/06/2023

## 2023-06-06 NOTE — Patient Instructions (Addendum)
Nice to see you again  No changes to medicines  Return to clinic in 6 months or sooner as needed

## 2023-06-15 ENCOUNTER — Telehealth: Payer: Self-pay | Admitting: Cardiology

## 2023-06-15 DIAGNOSIS — I10 Essential (primary) hypertension: Secondary | ICD-10-CM

## 2023-06-15 DIAGNOSIS — I480 Paroxysmal atrial fibrillation: Secondary | ICD-10-CM

## 2023-06-15 NOTE — Telephone Encounter (Signed)
Prescription refill request for Xarelto received.  Indication: a fib Last office visit: 03/03/23 Weight:158# Age: 87 Scr: 1.27 [facility] 12/20/22 CrCl: 43 ml/min

## 2023-06-15 NOTE — Telephone Encounter (Signed)
*  STAT* If patient is at the pharmacy, call can be transferred to refill team.   1. Which medications need to be refilled? (please list name of each medication and dose if known)   rivaroxaban (XARELTO) 20 MG TABS tablet    2. Which pharmacy/location (including street and city if local pharmacy) is medication to be sent to?  [facility] - [place], [place] - [address]      3. Do they need a 30 day or 90 day supply? 90 day    Pt is out of this medication

## 2023-06-16 MED ORDER — RIVAROXABAN 20 MG PO TABS
20.0000 mg | ORAL_TABLET | Freq: Every day | ORAL | 0 refills | Status: DC
Start: 2023-06-16 — End: 2023-09-28

## 2023-07-06 ENCOUNTER — Telehealth: Payer: Self-pay | Admitting: Pulmonary Disease

## 2023-07-06 MED ORDER — AMOXICILLIN-POT CLAVULANATE 875-125 MG PO TABS: 1.0000 | ORAL_TABLET | Freq: Two times a day (BID) | ORAL | 0 refills | Status: DC

## 2023-07-06 MED ORDER — PREDNISONE 20 MG PO TABS: 40.0000 mg | ORAL_TABLET | Freq: Every day | ORAL | 0 refills | Status: AC

## 2023-07-06 NOTE — Telephone Encounter (Signed)
[name] wife states patient having symptoms of cough, sneezing, and mucus. Pharmacy is [facility]. [name] phone number is [phone].

## 2023-07-06 NOTE — Telephone Encounter (Signed)
Prednisone 40 mg daily for 5 days and Augmentin 1 tablet twice a day for 7 days sent to local pharmacy.  Please encourage him to contact us if symptoms worsen or are not improving in the coming days.

## 2023-07-07 NOTE — Telephone Encounter (Signed)
Called and spoke with pt who verbalized understanding about pred and abx being sent to pharmacy [facility]

## 2023-07-09 ENCOUNTER — Other Ambulatory Visit: Payer: Self-pay | Admitting: Cardiology

## 2023-07-10 ENCOUNTER — Emergency Department (HOSPITAL_COMMUNITY)
Admission: EM | Admit: 2023-07-10 | Discharge: 2023-07-10 | Disposition: A | Discharge status: Home / Self Care | Payer: Medicare Other | Attending: Emergency Medicine | Admitting: Emergency Medicine

## 2023-07-10 ENCOUNTER — Other Ambulatory Visit: Payer: Self-pay

## 2023-07-10 ENCOUNTER — Encounter (HOSPITAL_COMMUNITY): Payer: Self-pay | Admitting: *Deleted

## 2023-07-10 DIAGNOSIS — I4891 Unspecified atrial fibrillation: Secondary | ICD-10-CM | POA: Insufficient documentation

## 2023-07-10 DIAGNOSIS — Z7901 Long term (current) use of anticoagulants: Secondary | ICD-10-CM | POA: Diagnosis not present

## 2023-07-10 DIAGNOSIS — E876 Hypokalemia: Secondary | ICD-10-CM | POA: Insufficient documentation

## 2023-07-10 DIAGNOSIS — Z79899 Other long term (current) drug therapy: Secondary | ICD-10-CM | POA: Diagnosis not present

## 2023-07-10 DIAGNOSIS — I2510 Atherosclerotic heart disease of native coronary artery without angina pectoris: Secondary | ICD-10-CM | POA: Insufficient documentation

## 2023-07-10 DIAGNOSIS — T502X5A Adverse effect of carbonic-anhydrase inhibitors, benzothiadiazides and other diuretics, initial encounter: Secondary | ICD-10-CM | POA: Insufficient documentation

## 2023-07-10 DIAGNOSIS — J449 Chronic obstructive pulmonary disease, unspecified: Secondary | ICD-10-CM | POA: Diagnosis not present

## 2023-07-10 DIAGNOSIS — I10 Essential (primary) hypertension: Secondary | ICD-10-CM | POA: Insufficient documentation

## 2023-07-10 DIAGNOSIS — R040 Epistaxis: Secondary | ICD-10-CM | POA: Insufficient documentation

## 2023-07-10 LAB — TYPE AND SCREEN
ABO/RH(D): A POS
Antibody Screen: NEGATIVE

## 2023-07-10 LAB — COMPREHENSIVE METABOLIC PANEL
ALT: 11 U/L (ref 0–44)
AST: 18 U/L (ref 15–41)
Albumin: 3.5 g/dL (ref 3.5–5.0)
Alkaline Phosphatase: 64 U/L (ref 38–126)
Anion gap: 15 (ref 5–15)
BUN: 31 mg/dL — ABNORMAL HIGH (ref 8–23)
CO2: 26 mmol/L (ref 22–32)
Calcium: 9 mg/dL (ref 8.9–10.3)
Chloride: 98 mmol/L (ref 98–111)
Creatinine, Ser: 1.51 mg/dL — ABNORMAL HIGH (ref 0.61–1.24)
GFR, Estimated: 45 mL/min — ABNORMAL LOW (ref >60)
Glucose, Bld: 205 mg/dL — ABNORMAL HIGH (ref 70–99)
Potassium: 2.8 mmol/L — ABNORMAL LOW (ref 3.5–5.1)
Sodium: 139 mmol/L (ref 135–145)
Total Bilirubin: 1.2 mg/dL — ABNORMAL HIGH (ref <1.2)
Total Protein: 6.5 g/dL (ref 6.5–8.1)

## 2023-07-10 LAB — CBC
HCT: 39.4 % (ref 39.0–52.0)
Hemoglobin: 13.8 g/dL (ref 13.0–17.0)
MCH: 31.7 pg (ref 26.0–34.0)
MCHC: 35 g/dL (ref 30.0–36.0)
MCV: 90.6 fL (ref 80.0–100.0)
Platelets: 392 10*3/uL (ref 150–400)
RBC: 4.35 MIL/uL (ref 4.22–5.81)
RDW: 13.4 % (ref 11.5–15.5)
WBC: 13.5 10*3/uL — ABNORMAL HIGH (ref 4.0–10.5)
nRBC: 0 % (ref 0.0–0.2)

## 2023-07-10 LAB — MAGNESIUM: Magnesium: 2.2 mg/dL (ref 1.7–2.4)

## 2023-07-10 MED ORDER — POTASSIUM CHLORIDE CRYS ER 20 MEQ PO TBCR: 40.0000 meq | EXTENDED_RELEASE_TABLET | Freq: Three times a day (TID) | ORAL | 0 refills | Status: DC

## 2023-07-10 MED ORDER — OXYMETAZOLINE HCL 0.05 % NA SOLN
1.0000 | Freq: Once | NASAL | Status: AC
  Administered 2023-07-10: 1 via NASAL
  Filled 2023-07-10: qty 30

## 2023-07-10 MED ORDER — POTASSIUM CHLORIDE CRYS ER 20 MEQ PO TBCR
40.0000 meq | EXTENDED_RELEASE_TABLET | Freq: Once | ORAL | Status: AC
  Administered 2023-07-10: 40 meq via ORAL
  Filled 2023-07-10: qty 2

## 2023-07-10 NOTE — Discharge Instructions (Addendum)
Keep the RapidRhino in place until you see the ENT physician.  Return to the emergency department if bleeding recurs.  Your potassium was very low.  This is probably because you are taking chlorthalidone.  I have ordered a prescription for potassium for you, and it is likely that she will need to continue taking potassium because of the chlorthalidone.  Please make an appointment with your cardiologist who can decide whether you need to continue taking potassium.

## 2023-07-10 NOTE — ED Provider Triage Note (Signed)
Emergency Medicine Provider Triage Evaluation Note  Larry Briggs , a 87 y.o. male  was evaluated in triage.  Pt complains of nosebleed since 2300 last night.  Has had continued bleeding from right nostril since this time despite holding pressure.  Recently had dose of xarelto increased to 20mg  (from 15mg ) due to small stroke.  Recent nasal congestion, pulmonology started abx and prednisone.  Nosebleeds this past week but never this prolonged.  Review of Systems  Positive: epistaxis Negative: fever  Physical Exam  BP 137/76 (BP Location: Right Arm)   Pulse 61   Temp (!) 96.5 F (35.8 C)   Resp 18   Ht 5\' 10"  (1.778 m)   Wt 71.9 kg   SpO2 99%   BMI 22.74 kg/m  Gen:   Awake, no distress   Resp:  Normal effort  MSK:   Moves extremities without difficulty  Other:  Clots present in right nostril-- blown out and has ongoing bleeding from right nostril, blood dripping down posterior oropharynx   Medical Decision Making  Medically screening exam initiated at 1:36 AM.  Appropriate orders placed.  Larry Briggs was informed that the remainder of the evaluation will be completed by another provider, this initial triage assessment does not replace that evaluation, and the importance of remaining in the ED until their evaluation is complete.  Epistaxis.  Blew out several clots from right nostril in triage and applied afrin without relief.  Likely will need nasal packing.   Larry Hatchet, PA-C 07/10/23 [phone]

## 2023-07-10 NOTE — ED Provider Notes (Signed)
[place] EMERGENCY DEPARTMENT AT [facility] Provider Note   CSN: [id number] Arrival date & time: 07/10/23  0104     History  Chief Complaint  Patient presents with   Epistaxis    Larry Briggs is a 87 y.o. male.  The history is provided by the patient.  Epistaxis He has a history of hypertension, hyperlipidemia, coronary artery disease, COPD, atrial fibrillation anticoagulated on rivaroxaban and comes in because of right-sided nosebleed tonight.  He has been having intermittent problems with nosebleed, but tonight he was not able to control it.  Of note, he had his dose of rivaroxaban increased recently from 15 mg a day to 20 mg a day because of breakthrough stroke.  He does have a history of prior nosebleeds on the right which had required cauterization by ENT.    Home Medications Prior to Admission medications   Medication Sig Start Date End Date Taking? Authorizing Provider  acetaminophen (TYLENOL) 500 MG tablet Take 500 mg by mouth at bedtime.    [provider]  acetaminophen (TYLENOL) 650 MG CR tablet Take 650 mg by mouth every 8 (eight) hours as needed.    [provider]  amLODipine (NORVASC) 5 MG tablet TAKE 1 TABLET EVERY DAY 08/12/22   [place], Peter M, MD  amoxicillin-clavulanate (AUGMENTIN) 875-125 MG tablet Take 1 tablet by mouth 2 (two) times daily. 07/06/23   Hunsucker, Lesia Sago, MD  azithromycin (ZITHROMAX) 250 MG tablet TAKE 1 TABLET 3 TIMES A WEEK ON MONDAY, WEDNESDAY, AND FRIDAY. 02/16/23   Hunsucker, Lesia Sago, MD  Bioflavonoid Products (ESTER-C) 500-550 MG TABS Take 500 mg by mouth daily.    [provider]  chlorthalidone (HYGROTON) 25 MG tablet Take 1 tablet (25 mg total) by mouth daily. 08/03/22   [place], Peter M, MD  docusate sodium (COLACE) 100 MG capsule Take 100 mg by mouth as needed.    [provider]  doxylamine, Sleep, (UNISOM) 25 MG tablet Take 25 mg by mouth at bedtime as needed.    [provider]  Fluticasone-Umeclidin-Vilant (TRELEGY ELLIPTA) 100-62.5-25 MCG/ACT AEPB Inhale 1 puff into the lungs daily. 03/01/22   Hunsucker, Lesia Sago, MD  guaiFENesin (MUCINEX) 600 MG 12 hr tablet Take by mouth 2 (two) times daily as needed.    [provider]  metoprolol succinate (TOPROL-XL) 25 MG 24 hr tablet TAKE 1 TABLET EVERY DAY 10/12/22   [place], Peter M, MD  nitroGLYCERIN (NITROSTAT) 0.4 MG SL tablet Place 1 tablet (0.4 mg total) under the tongue as needed. 08/27/22   [place], Peter M, MD  Omega-3 Fatty Acids (FISH OIL) 500 MG CAPS Take 1 capsule by mouth daily at 6 (six) AM.    [provider]  Polyethyl Glycol-Propyl Glycol (SYSTANE ULTRA OP) Apply 1 drop to eye 2 (two) times daily.    [provider]  predniSONE (DELTASONE) 20 MG tablet Take 2 tablets (40 mg total) by mouth daily with breakfast for 5 days. 07/06/23 07/11/23  Hunsucker, Lesia Sago, MD  Respiratory Therapy Supplies (FLUTTER) DEVI Use as directed 07/30/19   Nyoka Cowden, MD  rivaroxaban (XARELTO) 20 MG TABS tablet Take 1 tablet (20 mg total) by mouth daily with supper. 06/16/23   [place], Peter M, MD  silver sulfADIAZINE (SILVADENE) 1 % cream Apply 1 Application topically daily as needed. 06/14/22   Hunsucker, Lesia Sago, MD  simvastatin (ZOCOR) 40 MG tablet Take 1 tablet (40 mg total) by mouth daily at 6 PM. 12/07/22 03/03/23  [place], Peter M, MD  tamsulosin (FLOMAX) 0.4 MG CAPS capsule Take 0.4 mg by mouth daily after supper.    [provider]  VENTOLIN HFA 108 (90 Base) MCG/ACT inhaler INHALE 2 PUFFS BY MOUTH EVERY 4 HOURS AS NEEDED FOR WHEEZING FOR SHORTNESS OF BREATH 09/22/22   Hunsucker, Lesia Sago, MD      Allergies    Viagra [sildenafil] and Iodinated contrast media    Review of Systems   Review of Systems  HENT:  Positive for nosebleeds.   All other systems reviewed and are negative.   Physical Exam Updated Vital Signs BP 137/76 (BP Location: Right Arm)   Pulse 61   Temp  (!) 96.5 F (35.8 C)   Resp 18   Ht 5\' 10"  (1.778 m)   Wt 71.9 kg   SpO2 99%   BMI 22.74 kg/m  Physical Exam Vitals and nursing note reviewed.   87 year old male, resting comfortably and in no acute distress. Vital signs are normal. Oxygen saturation is 99%, which is normal. Head is normocephalic and atraumatic. PERRLA, EOMI. Oropharynx is clear.  His nasal septum is deviated to the left, some blood from recent bleeding is present but no active bleeding present. Neck is nontender and supple. Lungs are clear without rales, wheezes, or rhonchi. Chest is nontender. Heart has regular rate and rhythm without murmur. Abdomen is soft, flat, nontender. Extremities have no cyanosis or edema, full range of motion is present. Skin is warm and dry without rash. Neurologic: Mental status is normal, moves all extremities equally.  ED Results / Procedures / Treatments   Labs (all labs ordered are listed, but only abnormal results are displayed) Labs Reviewed  COMPREHENSIVE METABOLIC PANEL - Abnormal; Notable for the following components:      Result Value   Potassium 2.8 (*)    Glucose, Bld 205 (*)    BUN 31 (*)    Creatinine, Ser 1.51 (*)    Total Bilirubin 1.2 (*)    GFR, Estimated 45 (*)    All other components within normal limits  CBC - Abnormal; Notable for the following components:   WBC 13.5 (*)    All other components within normal limits  TYPE AND SCREEN   Procedures Epistaxis Management  Date/Time: 07/10/2023 3:01 AM  Performed by: Dione Booze, MD Authorized by: Dione Booze, MD   Consent:    Consent obtained:  Verbal   Consent given by:  Patient   Risks, benefits, and alternatives were discussed: yes     Risks discussed:  Infection, nasal injury and pain   Alternatives discussed:  Alternative treatment Universal protocol:    Procedure explained and questions answered to patient or proxy's satisfaction: yes     Relevant documents present and verified: yes      Required blood products, implants, devices, and special equipment available: yes     Site/side marked: yes     Immediately prior to procedure, a time out was called: yes     Patient identity confirmed:  Verbally with patient and arm band Anesthesia:    Anesthesia method:  None Procedure details:    Treatment site:  R anterior   Treatment method:  Nasal balloon (RapidRhino 5.5 cm)   Treatment complexity:  Limited   Treatment episode: initial   Post-procedure details:    Assessment:  Bleeding stopped   Procedure completion:  Tolerated well, no immediate complications     Medications Ordered in ED Medications  oxymetazoline (AFRIN) 0.05 %  nasal spray 1 spray (has no administration in time range)    ED Course/ Medical Decision Making/ A&P                                 Medical Decision Making Amount and/or Complexity of Data Reviewed Labs: ordered.  Risk Prescription drug management.   Right-sided epistaxis and patient anticoagulated on rivaroxaban.  I have reviewed his past records, and note office visit on 08/28/2020 for right-sided epistaxis treated with cautery.  In the ED, I have treated his epistaxis with a RapidRhino and plan on observing for recurrence of bleeding.  He had no recurrence of bleeding after 90 minutes. I am discharging him with a referral in ENT. He is to keep the RapidRhino in place until he is seen there. HE is noted to be hypokalemic. Ihave ordered oral potassium, and am discharging him with a prescription for oral potassium. I note that he is on chlorthalidone, will likely need ongoing potassium supplementation. I am referring him to his cardiologist to decide this.  Final Clinical Impression(s) / ED Diagnoses Final diagnoses:  Anterior epistaxis  Diuretic-induced hypokalemia  Chronic anticoagulation    Rx / DC Orders ED Discharge Orders          Ordered    potassium chloride SA (KLOR-CON M) 20 MEQ tablet  3 times daily        07/10/23 0424               Dione Booze, MD 07/10/23 [phone]

## 2023-07-10 NOTE — ED Triage Notes (Signed)
THE PT HAS HAD A NOSEBLEED TODAY SINCE  APPROX  [id number]  THIS IS HIS 3 OR 4TH NOSEBLEED SINCE HIS DOSE OF ZARELTO WAS INCREASED   APPROX 3 WEEKS AGO  HEAVY BLEEDING TRAIGE

## 2023-07-11 ENCOUNTER — Telehealth: Payer: Self-pay | Admitting: Cardiology

## 2023-07-11 DIAGNOSIS — E876 Hypokalemia: Secondary | ICD-10-CM

## 2023-07-11 NOTE — Telephone Encounter (Signed)
Pt c/o medication issue:  1. Name of Medication: Potassium  2. How are you currently taking this medication (dosage and times per day)? 3x a day for 10 days  3. Are you having a reaction (difficulty breathing--STAT)? no  4. What is your medication issue? Patient went to ED for nose bleed and was put on Potassium 3x a day for 10 days. Wife wants to know if patient needs to come in to be seen sooner than what he is scheduled for with Dr. [place] (09/01/23). Wife also wants Dr. [place] to manage his potassium and not have to go through patients PCP if possible.

## 2023-07-11 NOTE — Telephone Encounter (Signed)
Spoke with pt wife, aware he will need to have lab work done on Monday for [place] to see where his potassium is at so we will know what dose he needs to continue. Order placed

## 2023-07-15 DIAGNOSIS — Z7901 Long term (current) use of anticoagulants: Secondary | ICD-10-CM | POA: Diagnosis not present

## 2023-07-15 DIAGNOSIS — R040 Epistaxis: Secondary | ICD-10-CM | POA: Diagnosis not present

## 2023-07-18 ENCOUNTER — Telehealth: Payer: Self-pay | Admitting: *Deleted

## 2023-07-18 NOTE — Telephone Encounter (Signed)
Late entry  - patient and wife came to office today  Patient  and wife request that if patient need to continue with taking Potassium 20 meq tablet. They would like for Dr [place] to prescribe a small pil or capsule   ( They did not mind in tablet or capsule had to be doubled to make up the correct dosing ).  Patient has find very difficult swallowing  20,meq tablets. They both states he will complete what he has.  Patient was in the office to have labs drawn as requested.  Patient aware will send to Dr [place]iland and nurse.

## 2023-07-19 ENCOUNTER — Other Ambulatory Visit: Payer: Self-pay

## 2023-07-19 DIAGNOSIS — E875 Hyperkalemia: Secondary | ICD-10-CM

## 2023-07-19 LAB — BASIC METABOLIC PANEL
BUN/Creatinine Ratio: 14 (ref 10–24)
BUN: 20 mg/dL (ref 8–27)
CO2: 21 mmol/L (ref 20–29)
Calcium: 9 mg/dL (ref 8.6–10.2)
Chloride: 109 mmol/L — ABNORMAL HIGH (ref 96–106)
Creatinine, Ser: 1.43 mg/dL — ABNORMAL HIGH (ref 0.76–1.27)
Glucose: 146 mg/dL — ABNORMAL HIGH (ref 70–99)
Potassium: 5.6 mmol/L — ABNORMAL HIGH (ref 3.5–5.2)
Sodium: 144 mmol/L (ref 134–144)
eGFR: 48 mL/min/{1.73_m2} — ABNORMAL LOW (ref >59)

## 2023-07-19 MED ORDER — POTASSIUM CHLORIDE CRYS ER 20 MEQ PO TBCR: 20.0000 meq | EXTENDED_RELEASE_TABLET | Freq: Every day | ORAL | 6 refills | Status: DC

## 2023-08-07 ENCOUNTER — Other Ambulatory Visit: Payer: Self-pay | Admitting: Pulmonary Disease

## 2023-08-07 DIAGNOSIS — J449 Chronic obstructive pulmonary disease, unspecified: Secondary | ICD-10-CM

## 2023-08-08 DIAGNOSIS — E875 Hyperkalemia: Secondary | ICD-10-CM | POA: Diagnosis not present

## 2023-08-09 LAB — BASIC METABOLIC PANEL
BUN/Creatinine Ratio: 15 (ref 10–24)
BUN: 19 mg/dL (ref 8–27)
CO2: 25 mmol/L (ref 20–29)
Calcium: 9.5 mg/dL (ref 8.6–10.2)
Chloride: 97 mmol/L (ref 96–106)
Creatinine, Ser: 1.31 mg/dL — ABNORMAL HIGH (ref 0.76–1.27)
Glucose: 153 mg/dL — ABNORMAL HIGH (ref 70–99)
Potassium: 3.9 mmol/L (ref 3.5–5.2)
Sodium: 137 mmol/L (ref 134–144)
eGFR: 53 mL/min/{1.73_m2} — ABNORMAL LOW (ref >59)

## 2023-08-18 ENCOUNTER — Other Ambulatory Visit: Payer: Self-pay | Admitting: Pulmonary Disease

## 2023-08-25 NOTE — Progress Notes (Signed)
[name] Date of Birth: 1936-06-29 Medical Record #[id number]  History of Present Illness: Annette Stable is seen back today for follow up CAD.   He is status post coronary bypass surgery in 1995. He underwent stenting of the left main coronary in 2004. His last cardiac catheterization in April 2012 showed that everything was patent except for a vein graft to the distal right coronary. The PDA was supplied by left to right collaterals.   He has HTN and COPD.    Echocardiogram obtained on 09/07/2017 showed EF 50-55%, grade 1 DD, trivial pericardial effusion.  Overall poor image.  He did develop Afib following hip surgery. He has been on anticoagulation with Eliquis. Beta blocker stopped due to bradycardia.   On follow up today he reports he is doing well. He has chronic dyspnea with activity. No chest pain. No palpitations. Was in ED in Dec with nosebleed.    Current Outpatient Medications on File Prior to Visit  Medication Sig Dispense Refill   acetaminophen (TYLENOL) 500 MG tablet Take 500 mg by mouth at bedtime.     acetaminophen (TYLENOL) 650 MG CR tablet Take 650 mg by mouth every 8 (eight) hours as needed.     amLODipine (NORVASC) 5 MG tablet TAKE 1 TABLET EVERY DAY 90 tablet 2   amoxicillin-clavulanate (AUGMENTIN) 875-125 MG tablet Take 1 tablet by mouth 2 (two) times daily. 14 tablet 0   azithromycin (ZITHROMAX) 250 MG tablet TAKE 1 TABLET 3 TIMES A WEEK ON MONDAY, WEDNESDAY, AND FRIDAY. 39 tablet 3   Bioflavonoid Products (ESTER-C) 500-550 MG TABS Take 500 mg by mouth daily.     chlorthalidone (HYGROTON) 25 MG tablet TAKE 1 TABLET EVERY DAY 90 tablet 2   docusate sodium (COLACE) 100 MG capsule Take 100 mg by mouth as needed.     doxylamine, Sleep, (UNISOM) 25 MG tablet Take 25 mg by mouth at bedtime as needed.     Fluticasone-Umeclidin-Vilant (TRELEGY ELLIPTA) 100-62.5-25 MCG/ACT AEPB Inhale 1 puff into the lungs daily. 180 each 3   metoprolol succinate (TOPROL-XL) 25 MG 24 hr  tablet TAKE 1 TABLET EVERY DAY 90 tablet 3   Omega-3 Fatty Acids (FISH OIL) 500 MG CAPS Take 1 capsule by mouth daily at 6 (six) AM.     Polyethyl Glycol-Propyl Glycol (SYSTANE ULTRA OP) Apply 1 drop to eye 2 (two) times daily.     Respiratory Therapy Supplies (FLUTTER) DEVI Use as directed 1 each 0   rivaroxaban (XARELTO) 20 MG TABS tablet Take 1 tablet (20 mg total) by mouth daily with supper. 90 tablet 0   silver sulfADIAZINE (SILVADENE) 1 % cream Apply 1 Application topically daily as needed. 50 g 2   simvastatin (ZOCOR) 40 MG tablet Take 1 tablet (40 mg total) by mouth daily at 6 PM. 90 tablet 3   tamsulosin (FLOMAX) 0.4 MG CAPS capsule Take 0.4 mg by mouth daily after supper.     VENTOLIN HFA 108 (90 Base) MCG/ACT inhaler INHALE 2 PUFFS BY MOUTH EVERY 4 HOURS AS NEEDED FOR WHEEZING OR SHORTNESS OF BREATH 18 g 0   nitroGLYCERIN (NITROSTAT) 0.4 MG SL tablet Place 1 tablet (0.4 mg total) under the tongue as needed. (Patient not taking: Reported on 09/01/2023) 30 tablet 3   No current facility-administered medications on file prior to visit.    Allergies  Allergen Reactions   Viagra [Sildenafil]     Other reaction(s): blue haze   Iodinated Contrast Media Nausea And Vomiting and Rash  Past Medical History:  Diagnosis Date   CAD (coronary artery disease)    Remote MI in 1980. CABG 1995. Stent to left main in 2004. Last cath in April of 2012. EF 50%; patent LIMA to DX/LAD with collateralization of the distal right, patent SVG to OM, occluded SVG to distal RCA, and patent stent to LAD   History of heart attack 1980   Hypercholesteremia    Hypertension    Increased glucose level    Obesity     Past Surgical History:  Procedure Laterality Date   CARDIAC CATHETERIZATION  1993   CARDIAC CATHETERIZATION  April 2012   Mild reduction if EF at 50%. Patent LIMA to DX/LAD with collateralization to distal RCA, patent SVG to OM and occluded SVG to distal right and patent stent to LAD/Left  main;   CORONARY ARTERY BYPASS GRAFT  05/1994   x5 LIMA to LAD & DX, SVG to LCX, SVG to RCA   CORONARY STENT PLACEMENT  2004   Stent to the L main   INTRAMEDULLARY (IM) NAIL INTERTROCHANTERIC Left 09/05/2017   Procedure: INTRAMEDULLARY (IM) NAIL INTERTROCHANTRIC;  Surgeon: Sheral Apley, MD;  Location: [facility] OR;  Service: Orthopedics;  Laterality: Left;    Social History   Tobacco Use  Smoking Status Former   Current packs/day: 0.00   Average packs/day: 0.8 packs/day for 40.9 years (30.7 ttl pk-yrs)   Types: Cigarettes   Start date: 64   Quit date: 07/02/1994   Years since quitting: 29.1  Smokeless Tobacco Never    Social History   Substance and Sexual Activity  Alcohol Use No    Family History  Problem Relation Age of Onset   Heart attack Father    Hypertension Father    Stroke Mother     Review of Systems: The review of systems is per the HPI.  All other systems were reviewed and are negative.  Physical Exam: BP (!) 120/56 (BP Location: Left Arm, Patient Position: Sitting, Cuff Size: Normal)   Pulse 97   Ht 5\' 10"  (1.778 m)   Wt 150 lb 9.6 oz (68.3 kg)   SpO2 94%   BMI 21.61 kg/m  GENERAL:  Well appearing elderly WM in NAD HEENT:  PERRL, EOMI, sclera are clear. Oropharynx is clear. NECK:  no JVD or bruit LUNGS:  Clear. Decreased BS- no rales or wheezes.  CHEST:  Unremarkable HEART:  RRR,  PMI not displaced or sustained,S1 and S2 within normal limits, no S3, no S4: no clicks, no rubs, no murmurs EXT:  2 + pulses throughout, no edema, no cyanosis no clubbing NEURO:  Alert and oriented x 3. Cranial nerves II through XII intact.   LABORATORY DATA:   Lab Results  Component Value Date   WBC 13.5 (H) 07/10/2023   HGB 13.8 07/10/2023   HCT 39.4 07/10/2023   PLT 392 07/10/2023   GLUCOSE 153 (H) 08/08/2023   CHOL 163 12/05/2022   TRIG 71 12/05/2022   HDL 69 12/05/2022   LDLCALC 80 12/05/2022   ALT 11 07/10/2023   AST 18 07/10/2023   NA 137 08/08/2023    K 3.9 08/08/2023   CL 97 08/08/2023   CREATININE 1.31 (H) 08/08/2023   BUN 19 08/08/2023   CO2 25 08/08/2023   TSH 1.470 07/10/2020   INR 1.5 (H) 12/04/2022   HGBA1C 4.7 (L) 12/05/2022   Labs reviewed from 07/22/16: cholesterol 174, triglycerides 140, LDL 82, HDL 64. CMET normal Except creatinine reported as 242 (this has  to be an error).  Dated 01/25/17: A1c 5.9%.  Dated 07/27/17: cholesterol 146, triglycerides 104, HDL 56, LDL 69.  Dated 08/03/18: cholesterol 151, triglycerides 102, HDL 58, LDL 73. CMET normal Dated 08/08/19: Triglycerides 131, HDL 64. Dated 01/26/21: cholesterol 139, triglycerides 73, HDL 68, Non HDL 71.  Dated 02/22/22: A1c 5.1%. triglycerides 62. Cholesterol 142, HDL 75, LDL 55. Creatinine 1.24. otherwise CBC and CMET normal.   Echo 09/07/17: Study Conclusions   - Left ventricle: The cavity size was normal. Wall thickness was   increased in a pattern of mild LVH. Systolic function was normal.   The estimated ejection fraction was in the range of 50% to 55%.   Doppler parameters are consistent with abnormal left ventricular   relaxation (grade 1 diastolic dysfunction). - Aortic valve: Mildly calcified annulus. - Right atrium: The atrium was mildly dilated. - Pericardium, extracardiac: A trivial pericardial effusion was   identified.   Impressions:   - Technically difficult echo   Image quality is poor.  Event monitor 09/26/17: Study Highlights   Normal sinus rhythm Atrial fibrillation- paroxysmal with controlled ventricular response Isolated PVCs     Event monitor 11/18/20: Study Highlights    Normal sinus rhythm. average HR 83. predominantly first degree AV block with infrequent second degree AV block Mobitz type 1 Frequent PVCs with bigeminy and NSVT- longest episode 16 beats. Brief runs of PAT. longest 6 beats. No Afib seen No sustained bradycardia or pauses.    Assessment / Plan: 1. CAD s/p CABG 1995. S/p stenting of the left main coronary artery  2004.  Cath 2012 showed occlusion of SVG to diagonal. He is asymptomatic. Continue amlodipine and Toprol XL  2. Paroxysmal Afib. On Xarelto  3.  HTN- continue current therapy  4. Hyperlipidemia.   At goal. LDL 55. On statin   5. Severe COPD with recurrent bronchitis. - followed by pulmonary.    Follow up in 6 months.

## 2023-08-29 DIAGNOSIS — R350 Frequency of micturition: Secondary | ICD-10-CM | POA: Diagnosis not present

## 2023-08-29 DIAGNOSIS — N401 Enlarged prostate with lower urinary tract symptoms: Secondary | ICD-10-CM | POA: Diagnosis not present

## 2023-09-01 ENCOUNTER — Ambulatory Visit: Payer: Medicare Other | Attending: Cardiology | Admitting: Cardiology

## 2023-09-01 ENCOUNTER — Encounter: Payer: Self-pay | Admitting: Cardiology

## 2023-09-01 VITALS — BP 120/56 | HR 97 | Ht 70.0 in | Wt 150.6 lb

## 2023-09-01 DIAGNOSIS — I25810 Atherosclerosis of coronary artery bypass graft(s) without angina pectoris: Secondary | ICD-10-CM | POA: Insufficient documentation

## 2023-09-01 DIAGNOSIS — E7800 Pure hypercholesterolemia, unspecified: Secondary | ICD-10-CM | POA: Diagnosis not present

## 2023-09-01 DIAGNOSIS — I10 Essential (primary) hypertension: Secondary | ICD-10-CM | POA: Diagnosis not present

## 2023-09-01 DIAGNOSIS — I480 Paroxysmal atrial fibrillation: Secondary | ICD-10-CM | POA: Insufficient documentation

## 2023-09-01 MED ORDER — POTASSIUM CHLORIDE CRYS ER 20 MEQ PO TBCR: 20.0000 meq | EXTENDED_RELEASE_TABLET | Freq: Every day | ORAL | 11 refills | Status: DC

## 2023-09-01 MED ORDER — NITROGLYCERIN 0.4 MG SL SUBL: 0.4000 mg | SUBLINGUAL_TABLET | SUBLINGUAL | 3 refills | Status: AC | PRN

## 2023-09-01 NOTE — Patient Instructions (Signed)
Medication Instructions:  Continue same medications *If you need a refill on your cardiac medications before your next appointment, please call your pharmacy*   Lab Work: None ordered   Testing/Procedures: None ordered   Follow-Up: At [facility], you and your health needs are our priority.  As part of our continuing mission to provide you with exceptional heart care, we have created designated Provider Care Teams.  These Care Teams include your primary Cardiologist (physician) and Advanced Practice Providers (APPs -  Physician Assistants and Nurse Practitioners) who all work together to provide you with the care you need, when you need it.  We recommend signing up for the patient portal called "MyChart".  Sign up information is provided on this After Visit Summary.  MyChart is used to connect with patients for Virtual Visits (Telemedicine).  Patients are able to view lab/test results, encounter notes, upcoming appointments, etc.  Non-urgent messages can be sent to your provider as well.   To learn more about what you can do with MyChart, go to [email or web address].    Your next appointment:  6 months   Call in April to schedule July appointment  ( New Office )    Provider:  Dr.Jordan

## 2023-09-13 DIAGNOSIS — H6122 Impacted cerumen, left ear: Secondary | ICD-10-CM | POA: Diagnosis not present

## 2023-09-23 ENCOUNTER — Other Ambulatory Visit: Payer: Self-pay | Admitting: Cardiology

## 2023-09-23 DIAGNOSIS — I480 Paroxysmal atrial fibrillation: Secondary | ICD-10-CM

## 2023-09-23 NOTE — Telephone Encounter (Signed)
 Prescription refill request for Xarelto received.  Indication:afib Last office visit:1/25 Weight:68.3  kg Age:88 Scr:1.31  1/25 CrCl:39.83  ml/min  Under review

## 2023-09-28 NOTE — Telephone Encounter (Signed)
 Called and spoke with pt. Made him aware of dose change. Pt verbalized understanding.

## 2023-09-28 NOTE — Telephone Encounter (Signed)
 Per Laural Golden, PharmD, dose should be reduced to 15mg  daily. Called pt to discuss, no answer. Left message on voicemail.

## 2023-09-30 ENCOUNTER — Other Ambulatory Visit: Payer: Self-pay | Admitting: Cardiology

## 2023-10-02 ENCOUNTER — Telehealth: Payer: Self-pay | Admitting: Cardiology

## 2023-10-02 DIAGNOSIS — I480 Paroxysmal atrial fibrillation: Secondary | ICD-10-CM

## 2023-10-02 MED ORDER — RIVAROXABAN 15 MG PO TABS: 15.0000 mg | ORAL_TABLET | Freq: Every day | ORAL | 0 refills | Status: DC

## 2023-10-02 NOTE — Telephone Encounter (Signed)
 Patient's wife called the answering service this afternoon because the patient had run out of his xarelto. The medication has been ordered through a mail order pharmacy and should be arriving in the next few days. They are requesting a few pills to get him to when his full prescription is delivered   Sent 5 tablets to their requested pharmacy   Jonita Albee, PA-C 10/02/2023 1:26 PM

## 2023-10-03 ENCOUNTER — Other Ambulatory Visit: Payer: Self-pay

## 2023-10-03 DIAGNOSIS — I480 Paroxysmal atrial fibrillation: Secondary | ICD-10-CM

## 2023-10-03 MED ORDER — RIVAROXABAN 15 MG PO TABS
15.0000 mg | ORAL_TABLET | Freq: Every day | ORAL | 1 refills | Status: DC
Start: 2023-10-03 — End: 2024-03-19

## 2023-11-29 DIAGNOSIS — Z23 Encounter for immunization: Secondary | ICD-10-CM | POA: Diagnosis not present

## 2023-12-07 ENCOUNTER — Ambulatory Visit: Admitting: Pulmonary Disease

## 2023-12-07 ENCOUNTER — Encounter: Payer: Self-pay | Admitting: Pulmonary Disease

## 2023-12-07 VITALS — BP 114/60 | HR 70 | Temp 98.1°F | Ht 70.0 in | Wt 150.8 lb

## 2023-12-07 DIAGNOSIS — Z87891 Personal history of nicotine dependence: Secondary | ICD-10-CM

## 2023-12-07 DIAGNOSIS — J449 Chronic obstructive pulmonary disease, unspecified: Secondary | ICD-10-CM | POA: Diagnosis not present

## 2023-12-07 DIAGNOSIS — R0609 Other forms of dyspnea: Secondary | ICD-10-CM | POA: Diagnosis not present

## 2023-12-07 NOTE — Progress Notes (Signed)
 @Patient  ID: Larry Briggs, male    DOB: [birth year], 88 y.o.   MRN: [id number]  Chief Complaint  Patient presents with   Follow-up    Doing well. No sx noted today.    Referring provider: Imelda Man, MD  HPI:   Larry Briggs is a 88 y.o. man with past medical history of tobacco abuse now in remission and severe (FEV1 30%) COPD here for follow-up.  Most recent Cardiology note reviewed.    Overall he is doing well.  No exacerbations since last visit.  None in the last 6 months.   Reports good adherence to Trelegy as well as azithromycin  Monday Wednesday Friday.  We discussed different options for rescue inhaler.  I think thinking about something like Combivent or DuoNebs would be the most likely alternative to provide some benefit.  Albeit the likelihood of further improvement is low.  After discussing the 2 alternatives there are barriers to using these.  Will stick with albuterol  for now.  Questionaires / Pulmonary Flowsheets:   ACT:      No data to display          MMRC:     No data to display          Epworth:      No data to display          Tests:   FENO:  Lab Results  Component Value Date   NITRICOXIDE 30 06/01/2018    PFT:    Latest Ref Rng & Units 04/26/2016    8:35 AM  PFT Results  FVC-Pre L 2.20   FVC-Predicted Pre % 53   FVC-Post L 2.41   FVC-Predicted Post % 58   Pre FEV1/FVC % % 40   Post FEV1/FCV % % 37   FEV1-Pre L 0.89   FEV1-Predicted Pre % 30   FEV1-Post L 0.90   DLCO uncorrected ml/min/mmHg 13.54   DLCO UNC% % 41   DLCO corrected ml/min/mmHg 14.48   DLCO COR %Predicted % 44   DLVA Predicted % 53   TLC L 8.20   TLC % Predicted % 116   RV % Predicted % 191    Severe COPD with severely reduced DLCO on my interpretation.   WALK:      No data to display          Imaging: No results found.  Lab Results: Personally reviewed, no history of eosinophilia CBC    Component Value Date/Time   WBC 13.5 (H) 07/10/2023  0145   RBC 4.35 07/10/2023 0145   HGB 13.8 07/10/2023 0145   HGB 14.6 07/10/2020 1423   HCT 39.4 07/10/2023 0145   HCT 41.3 07/10/2020 1423   PLT 392 07/10/2023 0145   PLT 205 07/10/2020 1423   MCV 90.6 07/10/2023 0145   MCV 97 07/10/2020 1423   MCH 31.7 07/10/2023 0145   MCHC 35.0 07/10/2023 0145   RDW 13.4 07/10/2023 0145   RDW 13.8 07/10/2020 1423   LYMPHSABS 1.0 12/05/2022 0240   LYMPHSABS 1.2 07/10/2020 1423   MONOABS 0.5 12/05/2022 0240   EOSABS 0.1 12/05/2022 0240   EOSABS 0.2 07/10/2020 1423   BASOSABS 0.0 12/05/2022 0240   BASOSABS 0.0 07/10/2020 1423    BMET    Component Value Date/Time   NA 137 08/08/2023 1157   K 3.9 08/08/2023 1157   CL 97 08/08/2023 1157   CO2 25 08/08/2023 1157   GLUCOSE 153 (H) 08/08/2023 1157   GLUCOSE 205 (H) 07/10/2023  0145   BUN 19 08/08/2023 1157   CREATININE 1.31 (H) 08/08/2023 1157   CREATININE 0.86 03/09/2016 0926   CALCIUM  9.5 08/08/2023 1157   GFRNONAA 45 (L) 07/10/2023 0145   GFRAA 77 07/10/2020 1423    BNP No results found for: "BNP"  ProBNP No results found for: "PROBNP"  Specialty Problems       Pulmonary Problems   COPD GOLD 4    Quit smoking 1995 - Spirometry  04/26/16   FEV1 0.90 (30%)  Ratio 0.37  No resp to saba p ? Prior to study - 07/30/2019  After extensive coaching inhaler device,  effectiveness =    75% (short Ti)        Cough    Allergies  Allergen Reactions   Viagra [Sildenafil]     Other reaction(s): blue haze   Iodinated Contrast Media Nausea And Vomiting and Rash    Immunization History  Administered Date(s) Administered   Fluad Quad(high Dose 65+) 05/18/2019, 05/22/2021   Influenza Inj Mdck Quad Pf 04/20/2018   Influenza Split 04/16/2016   Influenza, High Dose Seasonal PF 05/17/2014, 05/16/2015, 05/02/2020, 05/14/2022   Influenza, Quadrivalent, Recombinant, Inj, Pf 05/16/2017, 05/01/2018, 05/11/2019, 05/02/2020   Influenza-Unspecified 05/26/2012, 04/20/2013   Moderna Sars-Cov-2 Peds  vaccine 44yrs thru 89yrs 05/31/2022   PFIZER(Purple Top)SARS-COV-2 Vaccination 08/23/2019, 09/13/2019, 05/07/2020, 02/09/2021   Pneumococcal Conjugate-13 06/14/2014   Zoster Recombinant(Shingrix) 05/24/2018, 08/17/2018   Zoster, Live 06/07/2006    Past Medical History:  Diagnosis Date   CAD (coronary artery disease)    Remote MI in 1980. CABG 1995. Stent to left main in 2004. Last cath in April of 2012. EF 50%; patent LIMA to DX/LAD with collateralization of the distal right, patent SVG to OM, occluded SVG to distal RCA, and patent stent to LAD   History of heart attack 1980   Hypercholesteremia    Hypertension    Increased glucose level    Obesity     Tobacco History: Social History   Tobacco Use  Smoking Status Former   Current packs/day: 0.00   Average packs/day: 0.8 packs/day for 40.9 years (30.7 ttl pk-yrs)   Types: Cigarettes   Start date: 10   Quit date: 07/02/1994   Years since quitting: 29.4  Smokeless Tobacco Never   Counseling given: Not Answered   Continue to not smoke  Outpatient Encounter Medications as of 12/07/2023  Medication Sig   acetaminophen  (TYLENOL ) 500 MG tablet Take 500 mg by mouth at bedtime.   acetaminophen  (TYLENOL ) 650 MG CR tablet Take 650 mg by mouth every 8 (eight) hours as needed.   amLODipine  (NORVASC ) 5 MG tablet TAKE 1 TABLET EVERY DAY   azithromycin  (ZITHROMAX ) 250 MG tablet TAKE 1 TABLET 3 TIMES A WEEK ON MONDAY, WEDNESDAY, AND FRIDAY.   Bioflavonoid Products (ESTER-C) 500-550 MG TABS Take 500 mg by mouth daily.   chlorthalidone  (HYGROTON ) 25 MG tablet TAKE 1 TABLET EVERY DAY   docusate sodium  (COLACE) 100 MG capsule Take 100 mg by mouth as needed.   doxylamine, Sleep, (UNISOM) 25 MG tablet Take 25 mg by mouth at bedtime as needed.   Fluticasone -Umeclidin-Vilant (TRELEGY ELLIPTA ) 100-62.5-25 MCG/ACT AEPB Inhale 1 puff into the lungs daily.   metoprolol  succinate (TOPROL -XL) 25 MG 24 hr tablet TAKE 1 TABLET EVERY DAY   nitroGLYCERIN   (NITROSTAT ) 0.4 MG SL tablet Place 1 tablet (0.4 mg total) under the tongue as needed.   Omega-3 Fatty Acids (FISH OIL) 500 MG CAPS Take 1 capsule by mouth daily at  6 (six) AM.   Polyethyl Glycol-Propyl Glycol (SYSTANE ULTRA OP) Apply 1 drop to eye 2 (two) times daily.   Respiratory Therapy Supplies (FLUTTER) DEVI Use as directed   Rivaroxaban  (XARELTO ) 15 MG TABS tablet Take 1 tablet (15 mg total) by mouth daily.   silver  sulfADIAZINE  (SILVADENE ) 1 % cream Apply 1 Application topically daily as needed.   tamsulosin  (FLOMAX ) 0.4 MG CAPS capsule Take 0.4 mg by mouth daily after supper.   VENTOLIN  HFA 108 (90 Base) MCG/ACT inhaler INHALE 2 PUFFS BY MOUTH EVERY 4 HOURS AS NEEDED FOR WHEEZING OR SHORTNESS OF BREATH   potassium chloride  SA (KLOR-CON  M20) 20 MEQ tablet Take 1 tablet (20 mEq total) by mouth daily.   simvastatin  (ZOCOR ) 40 MG tablet Take 1 tablet (40 mg total) by mouth daily at 6 PM.   [DISCONTINUED] amoxicillin -clavulanate (AUGMENTIN ) 875-125 MG tablet Take 1 tablet by mouth 2 (two) times daily. (Patient not taking: Reported on 12/07/2023)   No facility-administered encounter medications on file as of 12/07/2023.      Physical Exam  BP 114/60 (BP Location: Left Arm, Patient Position: Sitting, Cuff Size: Normal)   Pulse 70   Temp 98.1 F (36.7 C) (Oral)   Ht 5\' 10"  (1.778 m)   Wt 150 lb 12.8 oz (68.4 kg)   SpO2 93%   BMI 21.64 kg/m   Wt Readings from Last 5 Encounters:  12/07/23 150 lb 12.8 oz (68.4 kg)  09/01/23 150 lb 9.6 oz (68.3 kg)  07/10/23 158 lb 8.2 oz (71.9 kg)  06/06/23 158 lb 9.6 oz (71.9 kg)  03/03/23 154 lb 9.6 oz (70.1 kg)    BMI Readings from Last 5 Encounters:  12/07/23 21.64 kg/m  09/01/23 21.61 kg/m  07/10/23 22.74 kg/m  06/06/23 22.76 kg/m  03/03/23 22.18 kg/m     Physical Exam General: Sitting up in exam chair, no acute distress Eyes: EOMI, no icterus Respiratory: Distant sounds bilaterally, no wheeze or crackle Cardiovascular: Warm,  regular rate Extremities: No lower extremity edema, warm   Assessment & Plan:   Dyspnea on exertion: Likely largely driven by severe COPD.  Stable.  Encourage increased exertional capacity as tolerated.  Severe FEV1 30% COPD, Gold category D: Prior frequent exacerbated.  Azithromycin  low-dose Monday Wednesday Friday added 11/2020.  No further exacerbations in interim.  To continue this.  To continue Trelegy.     Return in about 1 year (around 12/06/2024).   Guerry Leek, MD 12/07/2023

## 2023-12-07 NOTE — Patient Instructions (Signed)
 No change in the medicine  We discussed other options for rescue inhalers, no other good options, lets stick with albuterol   Return to clinic in 1 year or sooner as needed with Dr. Marygrace Snellen

## 2023-12-08 DIAGNOSIS — L299 Pruritus, unspecified: Secondary | ICD-10-CM | POA: Diagnosis not present

## 2023-12-19 ENCOUNTER — Other Ambulatory Visit: Payer: Self-pay

## 2023-12-19 ENCOUNTER — Other Ambulatory Visit: Payer: Self-pay | Admitting: Cardiology

## 2023-12-19 ENCOUNTER — Telehealth: Payer: Self-pay | Admitting: Internal Medicine

## 2023-12-19 NOTE — Telephone Encounter (Signed)
 Refill request from [facility]  Trelegy Ellipta  100 mcg-62.5 mcg--25 mcg Powder for inhalation   last refill 12/19/23  please provide strength, direstions, quantity and refills   phone [phone]  fax [phone]

## 2023-12-19 NOTE — Telephone Encounter (Addendum)
 Per [facility] they have not filled Trelegy for patient since 02/01/2023.   Pharmacy tech states notes indicate patient spoke with someone at [facility] today and request for refill was initiated.   Routing to [employer] as he is primary Pulm provider for this patient. Thanks!

## 2023-12-20 MED ORDER — TRELEGY ELLIPTA 100-62.5-25 MCG/ACT IN AEPB: 1.0000 | INHALATION_SPRAY | Freq: Every day | RESPIRATORY_TRACT | 3 refills | Status: DC

## 2023-12-20 NOTE — Telephone Encounter (Signed)
Trelegy refill sent.

## 2023-12-20 NOTE — Telephone Encounter (Signed)
 Pt's spouse, Susan(DPR) is aware of below message and voiced her understanding.  Nothing further needed.

## 2023-12-28 ENCOUNTER — Ambulatory Visit: Attending: Internal Medicine | Admitting: Audiologist

## 2023-12-28 DIAGNOSIS — H903 Sensorineural hearing loss, bilateral: Secondary | ICD-10-CM | POA: Diagnosis not present

## 2023-12-28 NOTE — Procedures (Signed)
  Outpatient Audiology and [facility] [address] [place], [place]  [address] [phone]  AUDIOLOGICAL  EVALUATION  NAME: Larry Briggs     DOB:   03-03-1936      MRN: [id number]                                                                                     DATE: 12/28/2023     REFERENT: Imelda Man, MD STATUS: Outpatient DIAGNOSIS: Sensorineural Hearing Loss Bilateral    History: Larry Briggs was seen for an audiological evaluation due to difficulty hearing in noisy restaurants. Larry Briggs feels he can hear well otherwise. He and his wide struggle to communicate at home when they are sitting in different rooms.  Larry Briggs denies pain, pressure, or tinnitus.  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  Medical history shows no additional risk for hearing loss.    Evaluation:  Otoscopy showed a clear view of the tympanic membranes, bilaterally Tympanometry results were consistent with normal middle ear function, bilaterally   Audiometric testing was completed using Conventional Audiometry techniques with insert earphones and supraural headphones. Test results are consistent with sloping mild to severe sensorineural hearing loss bilaterally. Speech Recognition Thresholds were obtained at 30 dB HL in the right ear and at 30dB HL in the left ear. Word Recognition Testing was completed at  40dB SL and Larry Briggs scored 92% in the right ear and 76% in the left ear.    Results:  The test results were reviewed with Larry Briggs and his wife. Larry Briggs has a mild sloping to severe sensorineural hearing loss in both ears. Larry Briggs has no reported difficulty hearing on a daily basis. He and his wife need to speak face to face for clarity. Hearing is still in mild hearing loss range at Larry Briggs. Larry Briggs is a borderline hearing aid candidate. He has no previously considered hearing aids and would prefer to monitor.   Audiogram printed and provided to Larry Briggs.    Recommendations: Annual audiometric testing recommended to monitor hearing loss for progression.    23 minutes spent testing and counseling on results.   If you have any questions please feel free to contact me at [phone].  Raynald Calkins Stalnaker Au.D.  Audiologist   12/28/2023  2:39 PM  Cc: Imelda Man, MD

## 2023-12-29 DIAGNOSIS — Z961 Presence of intraocular lens: Secondary | ICD-10-CM | POA: Diagnosis not present

## 2024-01-18 DIAGNOSIS — D485 Neoplasm of uncertain behavior of skin: Secondary | ICD-10-CM | POA: Diagnosis not present

## 2024-01-18 DIAGNOSIS — D0439 Carcinoma in situ of skin of other parts of face: Secondary | ICD-10-CM | POA: Diagnosis not present

## 2024-01-18 DIAGNOSIS — L821 Other seborrheic keratosis: Secondary | ICD-10-CM | POA: Diagnosis not present

## 2024-01-31 ENCOUNTER — Other Ambulatory Visit: Payer: Self-pay | Admitting: Pulmonary Disease

## 2024-01-31 DIAGNOSIS — J449 Chronic obstructive pulmonary disease, unspecified: Secondary | ICD-10-CM

## 2024-02-08 ENCOUNTER — Telehealth: Payer: Self-pay

## 2024-02-08 MED ORDER — SILVER SULFADIAZINE 1 % EX CREA: 1.0000 | TOPICAL_CREAM | Freq: Every day | CUTANEOUS | 2 refills | Status: AC | PRN

## 2024-02-08 NOTE — Telephone Encounter (Signed)
 Copied from CRM [id number]. Topic: Clinical - Prescription Issue >> Feb 06, 2024  9:33 AM Leila C wrote: Reason for CRM: Patient's wife Devere [phone] states Dr. Annella usually prescribe Silver  sulfate 1% cream 50 grams when patient has a skin abrasions. Devere states then refill expired 11/24 and could not refill it. Devere states patient has a few spots on left arm, and the cream is helpful there's no signs of infections. Please advise and call back.  [facility] [address] [place] - [address] [place] [address] Phone: [phone] Fax: [phone]  Rx sent to pharmacy per [place] ov note mentioning new prescription. Left pt detailed vm per DPR. Nfn

## 2024-02-28 NOTE — Progress Notes (Deleted)
 [name] Date of Birth: [birth year] Medical Record #[id number]  History of Present Illness: Zell is seen back today for follow up CAD.   He is status post coronary bypass surgery in 1995. He underwent stenting of the left main coronary in 2004. His last cardiac catheterization in April 2012 showed that everything was patent except for a vein graft to the distal right coronary. The PDA was supplied by left to right collaterals.   He has HTN and COPD.    Echocardiogram obtained on 09/07/2017 showed EF 50-55%, grade 1 DD, trivial pericardial effusion.  Overall poor image.  He did develop Afib following hip surgery. He has been on anticoagulation with Eliquis. Beta blocker stopped due to bradycardia.   On follow up today he reports he is doing well. He has chronic dyspnea with activity. No chest pain. No palpitations. Was in ED in Dec with nosebleed.    Current Outpatient Medications on File Prior to Visit  Medication Sig Dispense Refill   acetaminophen  (TYLENOL ) 500 MG tablet Take 500 mg by mouth at bedtime.     acetaminophen  (TYLENOL ) 650 MG CR tablet Take 650 mg by mouth every 8 (eight) hours as needed.     amLODipine  (NORVASC ) 5 MG tablet TAKE 1 TABLET EVERY DAY 90 tablet 2   azithromycin  (ZITHROMAX ) 250 MG tablet TAKE 1 TABLET 3 TIMES A WEEK ON MONDAY, WEDNESDAY, AND FRIDAY. 39 tablet 3   Bioflavonoid Products (ESTER-C) 500-550 MG TABS Take 500 mg by mouth daily.     chlorthalidone  (HYGROTON ) 25 MG tablet TAKE 1 TABLET EVERY DAY 90 tablet 2   docusate sodium  (COLACE) 100 MG capsule Take 100 mg by mouth as needed.     doxylamine, Sleep, (UNISOM) 25 MG tablet Take 25 mg by mouth at bedtime as needed.     Fluocinolone Acetonide 0.01 % OIL 4 drops to affected ear(s) once or twice daily as needed for itching     Fluticasone -Umeclidin-Vilant (TRELEGY ELLIPTA ) 100-62.5-25 MCG/ACT AEPB Inhale 1 puff into the lungs daily. 180 each 3   metoprolol  succinate (TOPROL -XL) 25 MG 24 hr tablet  TAKE 1 TABLET EVERY DAY 90 tablet 3   nitroGLYCERIN  (NITROSTAT ) 0.4 MG SL tablet Place 1 tablet (0.4 mg total) under the tongue as needed. 30 tablet 3   Omega-3 Fatty Acids (FISH OIL) 500 MG CAPS Take 1 capsule by mouth daily at 6 (six) AM.     Polyethyl Glycol-Propyl Glycol (SYSTANE ULTRA OP) Apply 1 drop to eye 2 (two) times daily.     potassium chloride  SA (KLOR-CON  M20) 20 MEQ tablet Take 1 tablet (20 mEq total) by mouth daily. 30 tablet 11   Respiratory Therapy Supplies (FLUTTER) DEVI Use as directed 1 each 0   Rivaroxaban  (XARELTO ) 15 MG TABS tablet Take 1 tablet (15 mg total) by mouth daily. 90 tablet 1   silver  sulfADIAZINE  (SILVADENE ) 1 % cream Apply 1 Application topically daily as needed. 50 g 2   simvastatin  (ZOCOR ) 40 MG tablet TAKE 1 TABLET (40 MG TOTAL) BY MOUTH DAILY AT 6 PM. 90 tablet 3   tamsulosin  (FLOMAX ) 0.4 MG CAPS capsule Take 0.4 mg by mouth daily after supper.     VENTOLIN  HFA 108 (90 Base) MCG/ACT inhaler INHALE 2 PUFFS BY MOUTH EVERY 4 HOURS AS NEEDED FOR WHEEZING OR SHORTNESS OF BREATH 18 g 0   No current facility-administered medications on file prior to visit.    Allergies  Allergen Reactions   Viagra [Sildenafil]  Other reaction(s): blue haze   Iodinated Contrast Media Nausea And Vomiting and Rash    Past Medical History:  Diagnosis Date   CAD (coronary artery disease)    Remote MI in 1980. CABG 1995. Stent to left main in 2004. Last cath in April of 2012. EF 50%; patent LIMA to DX/LAD with collateralization of the distal right, patent SVG to OM, occluded SVG to distal RCA, and patent stent to LAD   History of heart attack 1980   Hypercholesteremia    Hypertension    Increased glucose level    Obesity     Past Surgical History:  Procedure Laterality Date   CARDIAC CATHETERIZATION  1993   CARDIAC CATHETERIZATION  April 2012   Mild reduction if EF at 50%. Patent LIMA to DX/LAD with collateralization to distal RCA, patent SVG to OM and occluded SVG  to distal right and patent stent to LAD/Left main;   CORONARY ARTERY BYPASS GRAFT  05/1994   x5 LIMA to LAD & DX, SVG to LCX, SVG to RCA   CORONARY STENT PLACEMENT  2004   Stent to the L main   INTRAMEDULLARY (IM) NAIL INTERTROCHANTERIC Left 09/05/2017   Procedure: INTRAMEDULLARY (IM) NAIL INTERTROCHANTRIC;  Surgeon: Beverley Evalene BIRCH, MD;  Location: [facility] OR;  Service: Orthopedics;  Laterality: Left;    Social History   Tobacco Use  Smoking Status Former   Current packs/day: 0.00   Average packs/day: 0.8 packs/day for 40.9 years (30.7 ttl pk-yrs)   Types: Cigarettes   Start date: 12   Quit date: 07/02/1994   Years since quitting: 29.6  Smokeless Tobacco Never    Social History   Substance and Sexual Activity  Alcohol Use No    Family History  Problem Relation Age of Onset   Heart attack Father    Hypertension Father    Stroke Mother     Review of Systems: The review of systems is per the HPI.  All other systems were reviewed and are negative.  Physical Exam: There were no vitals taken for this visit. GENERAL:  Well appearing elderly WM in NAD HEENT:  PERRL, EOMI, sclera are clear. Oropharynx is clear. NECK:  no JVD or bruit LUNGS:  Clear. Decreased BS- no rales or wheezes.  CHEST:  Unremarkable HEART:  RRR,  PMI not displaced or sustained,S1 and S2 within normal limits, no S3, no S4: no clicks, no rubs, no murmurs EXT:  2 + pulses throughout, no edema, no cyanosis no clubbing NEURO:  Alert and oriented x 3. Cranial nerves II through XII intact.   LABORATORY DATA:   Lab Results  Component Value Date   WBC 13.5 (H) 07/10/2023   HGB 13.8 07/10/2023   HCT 39.4 07/10/2023   PLT 392 07/10/2023   GLUCOSE 153 (H) 08/08/2023   CHOL 163 12/05/2022   TRIG 71 12/05/2022   HDL 69 12/05/2022   LDLCALC 80 12/05/2022   ALT 11 07/10/2023   AST 18 07/10/2023   NA 137 08/08/2023   K 3.9 08/08/2023   CL 97 08/08/2023   CREATININE 1.31 (H) 08/08/2023   BUN 19 08/08/2023    CO2 25 08/08/2023   TSH 1.470 07/10/2020   INR 1.5 (H) 12/04/2022   HGBA1C 4.7 (L) 12/05/2022   Labs reviewed from 07/22/16: cholesterol 174, triglycerides 140, LDL 82, HDL 64. CMET normal Except creatinine reported as 242 (this has to be an error).  Dated 01/25/17: A1c 5.9%.  Dated 07/27/17: cholesterol 146, triglycerides 104, HDL 56,  LDL 69.  Dated 08/03/18: cholesterol 151, triglycerides 102, HDL 58, LDL 73. CMET normal Dated 08/08/19: Triglycerides 131, HDL 64. Dated 01/26/21: cholesterol 139, triglycerides 73, HDL 68, Non HDL 71.  Dated 02/22/22: A1c 5.1%. triglycerides 62. Cholesterol 142, HDL 75, LDL 55. Creatinine 1.24. otherwise CBC and CMET normal.   Echo 09/07/17: Study Conclusions   - Left ventricle: The cavity size was normal. Wall thickness was   increased in a pattern of mild LVH. Systolic function was normal.   The estimated ejection fraction was in the range of 50% to 55%.   Doppler parameters are consistent with abnormal left ventricular   relaxation (grade 1 diastolic dysfunction). - Aortic valve: Mildly calcified annulus. - Right atrium: The atrium was mildly dilated. - Pericardium, extracardiac: A trivial pericardial effusion was   identified.   Impressions:   - Technically difficult echo   Image quality is poor.  Event monitor 09/26/17: Study Highlights   Normal sinus rhythm Atrial fibrillation- paroxysmal with controlled ventricular response Isolated PVCs     Event monitor 11/18/20: Study Highlights    Normal sinus rhythm. average HR 83. predominantly first degree AV block with infrequent second degree AV block Mobitz type 1 Frequent PVCs with bigeminy and NSVT- longest episode 16 beats. Brief runs of PAT. longest 6 beats. No Afib seen No sustained bradycardia or pauses.    Assessment / Plan: 1. CAD s/p CABG 1995. S/p stenting of the left main coronary artery 2004.  Cath 2012 showed occlusion of SVG to diagonal. He is asymptomatic. Continue amlodipine   and Toprol  XL  2. Paroxysmal Afib. On Xarelto   3.  HTN- continue current therapy  4. Hyperlipidemia.   At goal. LDL 55. On statin   5. Severe COPD with recurrent bronchitis. - followed by pulmonary.    Follow up in 6 months.

## 2024-02-29 DIAGNOSIS — E538 Deficiency of other specified B group vitamins: Secondary | ICD-10-CM | POA: Diagnosis not present

## 2024-02-29 DIAGNOSIS — I10 Essential (primary) hypertension: Secondary | ICD-10-CM | POA: Diagnosis not present

## 2024-02-29 DIAGNOSIS — R7309 Other abnormal glucose: Secondary | ICD-10-CM | POA: Diagnosis not present

## 2024-02-29 DIAGNOSIS — I259 Chronic ischemic heart disease, unspecified: Secondary | ICD-10-CM | POA: Diagnosis not present

## 2024-02-29 DIAGNOSIS — E785 Hyperlipidemia, unspecified: Secondary | ICD-10-CM | POA: Diagnosis not present

## 2024-03-05 DIAGNOSIS — E1121 Type 2 diabetes mellitus with diabetic nephropathy: Secondary | ICD-10-CM | POA: Diagnosis not present

## 2024-03-05 DIAGNOSIS — I10 Essential (primary) hypertension: Secondary | ICD-10-CM | POA: Diagnosis not present

## 2024-03-05 DIAGNOSIS — I480 Paroxysmal atrial fibrillation: Secondary | ICD-10-CM | POA: Diagnosis not present

## 2024-03-05 DIAGNOSIS — N401 Enlarged prostate with lower urinary tract symptoms: Secondary | ICD-10-CM | POA: Diagnosis not present

## 2024-03-05 DIAGNOSIS — Z0000 Encounter for general adult medical examination without abnormal findings: Secondary | ICD-10-CM | POA: Diagnosis not present

## 2024-03-05 DIAGNOSIS — J449 Chronic obstructive pulmonary disease, unspecified: Secondary | ICD-10-CM | POA: Diagnosis not present

## 2024-03-05 DIAGNOSIS — D6869 Other thrombophilia: Secondary | ICD-10-CM | POA: Diagnosis not present

## 2024-03-05 DIAGNOSIS — I259 Chronic ischemic heart disease, unspecified: Secondary | ICD-10-CM | POA: Diagnosis not present

## 2024-03-05 DIAGNOSIS — Z8673 Personal history of transient ischemic attack (TIA), and cerebral infarction without residual deficits: Secondary | ICD-10-CM | POA: Diagnosis not present

## 2024-03-05 DIAGNOSIS — E538 Deficiency of other specified B group vitamins: Secondary | ICD-10-CM | POA: Diagnosis not present

## 2024-03-05 DIAGNOSIS — I739 Peripheral vascular disease, unspecified: Secondary | ICD-10-CM | POA: Diagnosis not present

## 2024-03-06 NOTE — Progress Notes (Unsigned)
 Larry Briggs Date of Birth: [birth year] Medical Record #[id number]  History of Present Illness: Larry Briggs is seen back today for follow up CAD.   He is status post coronary bypass surgery in 1995. He underwent stenting of the left main coronary in 2004. His last cardiac catheterization in April 2012 showed that everything was patent except for a vein graft to the distal right coronary. The PDA was supplied by left to right collaterals.   He has HTN and COPD.    Echocardiogram obtained on 09/07/2017 showed EF 50-55%, grade 1 DD, trivial pericardial effusion.  Overall poor image.  He did develop Afib following hip surgery. He has been on anticoagulation with Eliquis. Beta blocker stopped due to bradycardia.   On follow up today he reports he is doing well. He has chronic dyspnea with activity. No chest pain. No palpitations. Was in ED in Dec with nosebleed.    Current Outpatient Medications on File Prior to Visit  Medication Sig Dispense Refill   acetaminophen  (TYLENOL ) 500 MG tablet Take 500 mg by mouth at bedtime.     acetaminophen  (TYLENOL ) 650 MG CR tablet Take 650 mg by mouth every 8 (eight) hours as needed.     amLODipine  (NORVASC ) 5 MG tablet TAKE 1 TABLET EVERY DAY 90 tablet 2   azithromycin  (ZITHROMAX ) 250 MG tablet TAKE 1 TABLET 3 TIMES A WEEK ON MONDAY, WEDNESDAY, AND FRIDAY. 39 tablet 3   Bioflavonoid Products (ESTER-C) 500-550 MG TABS Take 500 mg by mouth daily.     chlorthalidone  (HYGROTON ) 25 MG tablet TAKE 1 TABLET EVERY DAY 90 tablet 2   docusate sodium  (COLACE) 100 MG capsule Take 100 mg by mouth as needed.     doxylamine, Sleep, (UNISOM) 25 MG tablet Take 25 mg by mouth at bedtime as needed.     Fluocinolone Acetonide 0.01 % OIL 4 drops to affected ear(s) once or twice daily as needed for itching     Fluticasone -Umeclidin-Vilant (TRELEGY ELLIPTA ) 100-62.5-25 MCG/ACT AEPB Inhale 1 puff into the lungs daily. 180 each 3   metoprolol  succinate (TOPROL -XL) 25 MG 24 hr tablet  TAKE 1 TABLET EVERY DAY 90 tablet 3   nitroGLYCERIN  (NITROSTAT ) 0.4 MG SL tablet Place 1 tablet (0.4 mg total) under the tongue as needed. 30 tablet 3   Omega-3 Fatty Acids (FISH OIL) 500 MG CAPS Take 1 capsule by mouth daily at 6 (six) AM.     Polyethyl Glycol-Propyl Glycol (SYSTANE ULTRA OP) Apply 1 drop to eye 2 (two) times daily.     potassium chloride  SA (KLOR-CON  M20) 20 MEQ tablet Take 1 tablet (20 mEq total) by mouth daily. 30 tablet 11   Respiratory Therapy Supplies (FLUTTER) DEVI Use as directed 1 each 0   Rivaroxaban  (XARELTO ) 15 MG TABS tablet Take 1 tablet (15 mg total) by mouth daily. 90 tablet 1   silver  sulfADIAZINE  (SILVADENE ) 1 % cream Apply 1 Application topically daily as needed. 50 g 2   simvastatin  (ZOCOR ) 40 MG tablet TAKE 1 TABLET (40 MG TOTAL) BY MOUTH DAILY AT 6 PM. 90 tablet 3   tamsulosin  (FLOMAX ) 0.4 MG CAPS capsule Take 0.4 mg by mouth daily after supper.     VENTOLIN  HFA 108 (90 Base) MCG/ACT inhaler INHALE 2 PUFFS BY MOUTH EVERY 4 HOURS AS NEEDED FOR WHEEZING OR SHORTNESS OF BREATH 18 g 0   No current facility-administered medications on file prior to visit.    Allergies  Allergen Reactions   Viagra [Sildenafil]  Other reaction(s): blue haze   Iodinated Contrast Media Nausea And Vomiting and Rash    Past Medical History:  Diagnosis Date   CAD (coronary artery disease)    Remote MI in 1980. CABG 1995. Stent to left main in 2004. Last cath in April of 2012. EF 50%; patent LIMA to DX/LAD with collateralization of the distal right, patent SVG to OM, occluded SVG to distal RCA, and patent stent to LAD   History of heart attack 1980   Hypercholesteremia    Hypertension    Increased glucose level    Obesity     Past Surgical History:  Procedure Laterality Date   CARDIAC CATHETERIZATION  1993   CARDIAC CATHETERIZATION  April 2012   Mild reduction if EF at 50%. Patent LIMA to DX/LAD with collateralization to distal RCA, patent SVG to OM and occluded SVG  to distal right and patent stent to LAD/Left main;   CORONARY ARTERY BYPASS GRAFT  05/1994   x5 LIMA to LAD & DX, SVG to LCX, SVG to RCA   CORONARY STENT PLACEMENT  2004   Stent to the L main   INTRAMEDULLARY (IM) NAIL INTERTROCHANTERIC Left 09/05/2017   Procedure: INTRAMEDULLARY (IM) NAIL INTERTROCHANTRIC;  Surgeon: Beverley Evalene BIRCH, MD;  Location: [facility] OR;  Service: Orthopedics;  Laterality: Left;    Social History   Tobacco Use  Smoking Status Former   Current packs/day: 0.00   Average packs/day: 0.8 packs/day for 40.9 years (30.7 ttl pk-yrs)   Types: Cigarettes   Start date: 12   Quit date: 07/02/1994   Years since quitting: 29.6  Smokeless Tobacco Never    Social History   Substance and Sexual Activity  Alcohol Use No    Family History  Problem Relation Age of Onset   Heart attack Father    Hypertension Father    Stroke Mother     Review of Systems: The review of systems is per the HPI.  All other systems were reviewed and are negative.  Physical Exam: There were no vitals taken for this visit. GENERAL:  Well appearing elderly WM in NAD HEENT:  PERRL, EOMI, sclera are clear. Oropharynx is clear. NECK:  no JVD or bruit LUNGS:  Clear. Decreased BS- no rales or wheezes.  CHEST:  Unremarkable HEART:  RRR,  PMI not displaced or sustained,S1 and S2 within normal limits, no S3, no S4: no clicks, no rubs, no murmurs EXT:  2 + pulses throughout, no edema, no cyanosis no clubbing NEURO:  Alert and oriented x 3. Cranial nerves II through XII intact.   LABORATORY DATA:   Lab Results  Component Value Date   WBC 13.5 (H) 07/10/2023   HGB 13.8 07/10/2023   HCT 39.4 07/10/2023   PLT 392 07/10/2023   GLUCOSE 153 (H) 08/08/2023   CHOL 163 12/05/2022   TRIG 71 12/05/2022   HDL 69 12/05/2022   LDLCALC 80 12/05/2022   ALT 11 07/10/2023   AST 18 07/10/2023   NA 137 08/08/2023   K 3.9 08/08/2023   CL 97 08/08/2023   CREATININE 1.31 (H) 08/08/2023   BUN 19 08/08/2023    CO2 25 08/08/2023   TSH 1.470 07/10/2020   INR 1.5 (H) 12/04/2022   HGBA1C 4.7 (L) 12/05/2022   Labs reviewed from 07/22/16: cholesterol 174, triglycerides 140, LDL 82, HDL 64. CMET normal Except creatinine reported as 242 (this has to be an error).  Dated 01/25/17: A1c 5.9%.  Dated 07/27/17: cholesterol 146, triglycerides 104, HDL 56,  LDL 69.  Dated 08/03/18: cholesterol 151, triglycerides 102, HDL 58, LDL 73. CMET normal Dated 08/08/19: Triglycerides 131, HDL 64. Dated 01/26/21: cholesterol 139, triglycerides 73, HDL 68, Non HDL 71.  Dated 02/22/22: A1c 5.1%. triglycerides 62. Cholesterol 142, HDL 75, LDL 55. Creatinine 1.24. otherwise CBC and CMET normal.   Echo 09/07/17: Study Conclusions   - Left ventricle: The cavity size was normal. Wall thickness was   increased in a pattern of mild LVH. Systolic function was normal.   The estimated ejection fraction was in the range of 50% to 55%.   Doppler parameters are consistent with abnormal left ventricular   relaxation (grade 1 diastolic dysfunction). - Aortic valve: Mildly calcified annulus. - Right atrium: The atrium was mildly dilated. - Pericardium, extracardiac: A trivial pericardial effusion was   identified.   Impressions:   - Technically difficult echo   Image quality is poor.  Event monitor 09/26/17: Study Highlights   Normal sinus rhythm Atrial fibrillation- paroxysmal with controlled ventricular response Isolated PVCs     Event monitor 11/18/20: Study Highlights    Normal sinus rhythm. average HR 83. predominantly first degree AV block with infrequent second degree AV block Mobitz type 1 Frequent PVCs with bigeminy and NSVT- longest episode 16 beats. Brief runs of PAT. longest 6 beats. No Afib seen No sustained bradycardia or pauses.    Assessment / Plan: 1. CAD s/p CABG 1995. S/p stenting of the left main coronary artery 2004.  Cath 2012 showed occlusion of SVG to diagonal. He is asymptomatic. Continue amlodipine   and Toprol  XL  2. Paroxysmal Afib. On Xarelto   3.  HTN- continue current therapy  4. Hyperlipidemia.   At goal. LDL 55. On statin   5. Severe COPD with recurrent bronchitis. - followed by pulmonary.    Follow up in 6 months.

## 2024-03-08 ENCOUNTER — Ambulatory Visit: Attending: Cardiology | Admitting: Cardiology

## 2024-03-08 ENCOUNTER — Encounter: Payer: Self-pay | Admitting: Cardiology

## 2024-03-08 ENCOUNTER — Ambulatory Visit: Admitting: Cardiology

## 2024-03-08 VITALS — BP 129/60 | HR 81 | Ht 70.0 in | Wt 151.0 lb

## 2024-03-08 DIAGNOSIS — I25810 Atherosclerosis of coronary artery bypass graft(s) without angina pectoris: Secondary | ICD-10-CM | POA: Insufficient documentation

## 2024-03-08 DIAGNOSIS — Z8673 Personal history of transient ischemic attack (TIA), and cerebral infarction without residual deficits: Secondary | ICD-10-CM | POA: Insufficient documentation

## 2024-03-08 DIAGNOSIS — I480 Paroxysmal atrial fibrillation: Secondary | ICD-10-CM | POA: Insufficient documentation

## 2024-03-08 DIAGNOSIS — E7800 Pure hypercholesterolemia, unspecified: Secondary | ICD-10-CM | POA: Diagnosis not present

## 2024-03-08 DIAGNOSIS — I10 Essential (primary) hypertension: Secondary | ICD-10-CM | POA: Diagnosis not present

## 2024-03-08 NOTE — Patient Instructions (Signed)
 Medication Instructions:  Continue same medications *If you need a refill on your cardiac medications before your next appointment, please call your pharmacy*  Lab Work: None ordered  Testing/Procedures: None ordered  Follow-Up: At [facility], you and your health needs are our priority.  As part of our continuing mission to provide you with exceptional heart care, our providers are all part of one team.  This team includes your primary Cardiologist (physician) and Advanced Practice Providers or APPs (Physician Assistants and Nurse Practitioners) who all work together to provide you with the care you need, when you need it.  Your next appointment:  6 months   Call in Oct to schedule Feb appointment     Provider:  Dr.Jordan   We recommend signing up for the patient portal called MyChart.  Sign up information is provided on this After Visit Summary.  MyChart is used to connect with patients for Virtual Visits (Telemedicine).  Patients are able to view lab/test results, encounter notes, upcoming appointments, etc.  Non-urgent messages can be sent to your provider as well.   To learn more about what you can do with MyChart, go to [email or web address].

## 2024-03-19 ENCOUNTER — Telehealth: Payer: Self-pay | Admitting: Cardiology

## 2024-03-19 DIAGNOSIS — I480 Paroxysmal atrial fibrillation: Secondary | ICD-10-CM

## 2024-03-19 MED ORDER — RIVAROXABAN 15 MG PO TABS: 15.0000 mg | ORAL_TABLET | Freq: Every day | ORAL | 1 refills | Status: AC

## 2024-03-19 NOTE — Telephone Encounter (Signed)
*  STAT* If patient is at the pharmacy, call can be transferred to refill team.   1. Which medications need to be refilled? (please list name of each medication and dose if known) Rivaroxaban  (XARELTO ) 15 MG TABS tablet    2. Would you like to learn more about the convenience, safety, & potential cost savings by using the [facility]? No   3. Are you open to using the [facility] (Type [facility]. ) No   4. Which pharmacy/location (including street and city if local pharmacy) is medication to be sent to?  [facility] - [place], [place] - [address] Phone: [phone]           5. Do they need a 30 day or 90 day supply? 90 day

## 2024-03-19 NOTE — Telephone Encounter (Signed)
 Prescription refill request for Xarelto  received.  Indication: PAF Last office visit: 03/08/24  P [place] MD Weight: 68.5kg Age: 88 Scr: 1.19 on 02/29/24  Epic CrCl: 43.97  Based on above findings Xarelto  15mg  daily is the appropriate dose.  Refill approved.

## 2024-05-04 ENCOUNTER — Telehealth: Payer: Self-pay | Admitting: Cardiology

## 2024-05-04 DIAGNOSIS — Z23 Encounter for immunization: Secondary | ICD-10-CM | POA: Diagnosis not present

## 2024-05-04 NOTE — Telephone Encounter (Signed)
 Wife Isidoro) wants to know if Dr. Swaziland recommends patient get a COVID shot.

## 2024-05-07 NOTE — Telephone Encounter (Signed)
 Spoke to patient's wife Dr.Jordan recommends a covid shot.

## 2024-05-09 DIAGNOSIS — H6123 Impacted cerumen, bilateral: Secondary | ICD-10-CM | POA: Diagnosis not present

## 2024-05-09 DIAGNOSIS — E538 Deficiency of other specified B group vitamins: Secondary | ICD-10-CM | POA: Diagnosis not present

## 2024-05-14 ENCOUNTER — Other Ambulatory Visit: Payer: Self-pay | Admitting: Cardiology

## 2024-05-17 ENCOUNTER — Other Ambulatory Visit: Payer: Self-pay | Admitting: Pulmonary Disease

## 2024-05-17 DIAGNOSIS — J449 Chronic obstructive pulmonary disease, unspecified: Secondary | ICD-10-CM

## 2024-05-28 DIAGNOSIS — Z85828 Personal history of other malignant neoplasm of skin: Secondary | ICD-10-CM | POA: Diagnosis not present

## 2024-05-28 DIAGNOSIS — L905 Scar conditions and fibrosis of skin: Secondary | ICD-10-CM | POA: Diagnosis not present

## 2024-06-06 DIAGNOSIS — Z23 Encounter for immunization: Secondary | ICD-10-CM | POA: Diagnosis not present

## 2024-06-27 ENCOUNTER — Telehealth: Payer: Self-pay

## 2024-06-27 MED ORDER — TRELEGY ELLIPTA 100-62.5-25 MCG/ACT IN AEPB: 1.0000 | INHALATION_SPRAY | Freq: Every day | RESPIRATORY_TRACT | 0 refills | Status: AC

## 2024-06-27 NOTE — Telephone Encounter (Signed)
 Copied from CRM #[id number]. Topic: Clinical - Prescription Issue >> Jun 26, 2024 10:34 AM Leila BROCKS wrote: Reason for CRM: Patient's wife Devere [phone] states patient Fluticasone -Umeclidin-Vilant (TRELEGY ELLIPTA ) 100-62.5-25 MCG/ACT AEPB will be out tomorrow, and asked if there's any samples to pick up at the office today? Devere was going to ask  Fluticasone -Umeclidin-Vilant (TRELEGY ELLIPTA ) 100-62.5-25 MCG/ACT AEPB  to be sent to [facility] pharmacy but, [facility] pharmacy is already refill the medication and won't be able to send to the patient until 7-10 days. Devere states patient cannot be with out this medication. If there's no samples then please send an emergency supply/small quantity or something else the insurance will cover to [facility] pharmacy. Unable to reach CAL. Please advise and call back.   [facility] [address] [place] - [address] [place] [address] Phone: [phone] Fax: [phone]   ----------------------------------------------------------------------- From previous Reason for Contact - Medication Refill: Medication:   Has the patient contacted their pharmacy?   (Agent: If no, request that the patient contact the pharmacy for the refill. If patient does not wish to contact the pharmacy document the reason why and proceed with request.) (Agent: If yes, when and what did the pharmacy advise?)  This is the patient's preferred pharmacy:  [facility] [address], [place] - [address] [place] [address] Phone: [phone] Fax: [phone]  [facility] - [place], [place] - [address] [place] [address] Phone: [phone] Fax: [phone]  Is this the correct pharmacy for this prescription?   If no, delete pharmacy and type the correct one.   Has the prescription been filled recently?    Is the patient out of the  medication?    Has the patient been seen for an appointment in the last year OR does the patient have an upcoming appointment?    Can we respond through MyChart?    Agent: Please be advised that Rx refills may take up to 3 business days. We ask that you follow-up with your pharmacy. >> Jun 27, 2024  9:17 AM Leila C wrote: Patient's wife Devere [phone] states called yesterday regarding needing Fluticasone -Umeclidin-Vilant (TRELEGY ELLIPTA ) 100-62.5-25 MCG/ACT AEPB and has not heard from the office. Unable to reach CAL, tried a few times. Please advise and call back.    Called and spoke with pts wife (DPR) she states [facility] cannot deliver rx fast enough and needs [facility] supply. I have sent Trelegy inhaler to preferred pharmacy & pts wife is aware.  We do not have any samples.

## 2024-07-17 ENCOUNTER — Emergency Department (HOSPITAL_COMMUNITY)

## 2024-07-17 ENCOUNTER — Encounter (HOSPITAL_COMMUNITY): Payer: Self-pay

## 2024-07-17 ENCOUNTER — Other Ambulatory Visit: Payer: Self-pay

## 2024-07-17 ENCOUNTER — Emergency Department (HOSPITAL_COMMUNITY)
Admission: EM | Admit: 2024-07-17 | Discharge: 2024-07-17 | Disposition: A | Discharge status: Home / Self Care | Source: Home / Self Care | Attending: Emergency Medicine | Admitting: Emergency Medicine

## 2024-07-17 DIAGNOSIS — Z79899 Other long term (current) drug therapy: Secondary | ICD-10-CM | POA: Insufficient documentation

## 2024-07-17 DIAGNOSIS — R531 Weakness: Secondary | ICD-10-CM | POA: Insufficient documentation

## 2024-07-17 DIAGNOSIS — W108XXA Fall (on) (from) other stairs and steps, initial encounter: Secondary | ICD-10-CM | POA: Insufficient documentation

## 2024-07-17 DIAGNOSIS — R29818 Other symptoms and signs involving the nervous system: Secondary | ICD-10-CM | POA: Insufficient documentation

## 2024-07-17 DIAGNOSIS — R29898 Other symptoms and signs involving the musculoskeletal system: Secondary | ICD-10-CM

## 2024-07-17 LAB — CBC
HCT: 35.6 % — ABNORMAL LOW (ref 39.0–52.0)
Hemoglobin: 11.4 g/dL — ABNORMAL LOW (ref 13.0–17.0)
MCH: 28.2 pg (ref 26.0–34.0)
MCHC: 32 g/dL (ref 30.0–36.0)
MCV: 88.1 fL (ref 80.0–100.0)
Platelets: 325 K/uL (ref 150–400)
RBC: 4.04 MIL/uL — ABNORMAL LOW (ref 4.22–5.81)
RDW: 13.7 % (ref 11.5–15.5)
WBC: 8.2 K/uL (ref 4.0–10.5)
nRBC: 0 % (ref 0.0–0.2)

## 2024-07-17 LAB — COMPREHENSIVE METABOLIC PANEL WITH GFR
ALT: 7 U/L (ref 0–44)
AST: 18 U/L (ref 15–41)
Albumin: 3.9 g/dL (ref 3.5–5.0)
Alkaline Phosphatase: 81 U/L (ref 38–126)
Anion gap: 10 (ref 5–15)
BUN: 20 mg/dL (ref 8–23)
CO2: 26 mmol/L (ref 22–32)
Calcium: 9 mg/dL (ref 8.9–10.3)
Chloride: 102 mmol/L (ref 98–111)
Creatinine, Ser: 1.24 mg/dL (ref 0.61–1.24)
GFR, Estimated: 57 mL/min — ABNORMAL LOW (ref >60)
Glucose, Bld: 113 mg/dL — ABNORMAL HIGH (ref 70–99)
Potassium: 3.8 mmol/L (ref 3.5–5.1)
Sodium: 137 mmol/L (ref 135–145)
Total Bilirubin: 0.6 mg/dL (ref 0.0–1.2)
Total Protein: 6 g/dL — ABNORMAL LOW (ref 6.5–8.1)

## 2024-07-17 LAB — URINALYSIS, MICROSCOPIC (REFLEX)

## 2024-07-17 LAB — URINALYSIS, ROUTINE W REFLEX MICROSCOPIC
Bilirubin Urine: NEGATIVE
Glucose, UA: NEGATIVE mg/dL
Ketones, ur: NEGATIVE mg/dL
Nitrite: NEGATIVE
Protein, ur: NEGATIVE mg/dL
Specific Gravity, Urine: 1.025 (ref 1.005–1.030)
pH: 5.5 (ref 5.0–8.0)

## 2024-07-17 LAB — TROPONIN T, HIGH SENSITIVITY
Troponin T High Sensitivity: 57 ng/L — ABNORMAL HIGH (ref 0–19)
Troponin T High Sensitivity: 43 ng/L — ABNORMAL HIGH (ref 0–19)

## 2024-07-17 MED ORDER — SODIUM CHLORIDE 0.9 % IV BOLUS
500.0000 mL | Freq: Once | INTRAVENOUS | Status: AC
  Administered 2024-07-17: 18:00:00 500 mL via INTRAVENOUS

## 2024-07-17 MED ORDER — METHYLPREDNISOLONE 4 MG PO TBPK: ORAL_TABLET | ORAL | 0 refills | Status: DC

## 2024-07-17 NOTE — ED Notes (Signed)
 Patient transported to MRI

## 2024-07-17 NOTE — ED Triage Notes (Signed)
 PT BIB [facility] from home after stating around 0500 woke up 'not feeling good'. PT does not specify details. Hx stroke May 24, states similar symptoms/feeling.  PT continued throughout day, said experienced weakness and a 'buckling' underneath on R leg around 1320. PT aox4.   BP 120/72, HR 76, 100% Spo2 RA, CBG 134

## 2024-07-17 NOTE — Discharge Instructions (Signed)
 If weakness continues follow-up with your previous doctor.  Begin taking steroids tomorrow and take as directed.  If symptoms continue or worsen return to the ER.

## 2024-07-17 NOTE — ED Provider Notes (Cosign Needed)
 [facility] EM[facility]GENC[facility] DEPARTMENT AT [facility] Provider Note   CSN: [id number] Arrival date & time: 07/17/24  1433     Patient presents with: No chief complaint on file.   Larry Briggs is a 88 y.o. male.   HPI  88 year old male presenting today with right sided leg weakness.  Patient was brought in by EMS.  Patient states that he woke up this morning and just did not feel right.  He states whenever he got out of the bed he noticed that he had some right leg weakness.  He reports that he went to church with his wife and when they got home, he tried to go inside and fell to his knees and trying to step up the stairs.  Patient did not hit his head.  Patient did have a small stroke in May 2024.  The only deficits that he had was a small tremor in his hand and having a cane to walk for unsteadiness.  Patient denies any chest pain, shortness of breath, lightheadedness, or dizziness.  He denies any recent URI symptoms.  He denies any weakness in his upper extremities.    Prior to Admission medications  Medication Sig Start Date End Date Taking? Authorizing Provider  acetaminophen  (TYLENOL ) 500 MG tablet Take 500 mg by mouth at bedtime.    [provider]  acetaminophen  (TYLENOL ) 650 MG CR tablet Take 650 mg by mouth every 8 (eight) hours as needed.    [provider]  amLODipine  (NORVASC ) 5 MG tablet TAKE 1 TABLET EVERY DAY 05/14/24   Jordan, Peter M, MD  azithromycin  (ZITHROMAX ) 250 MG tablet TAKE 1 TABLET 3 TIMES A WEEK ON MONDAY, WEDNESDAY, AND FRIDAY. 08/22/23   Hunsucker, Donnice SAUNDERS, MD  Bioflavonoid Products (ESTER-C) 500-550 MG TABS Take 500 mg by mouth daily.    [provider]  chlorthalidone  (HYGROTON ) 25 MG tablet TAKE 1 TABLET EVERY DAY 05/14/24   Jordan, Peter M, MD  cyanocobalamin  (VITAMIN B12) 1000 MCG tablet Take 1,000 mcg by mouth daily.    [provider]  docusate sodium  (COLACE) 100 MG capsule Take 100 mg by mouth as needed.     [provider]  doxylamine, Sleep, (UNISOM) 25 MG tablet Take 25 mg by mouth at bedtime as needed.    [provider]  Fluocinolone Acetonide 0.01 % OIL 4 drops to affected ear(s) once or twice daily as needed for itching 12/08/23   [provider]  Fluticasone -Umeclidin-Vilant (TRELEGY ELLIPTA ) 100-62.5-25 MCG/ACT AEPB Inhale 1 puff into the lungs daily. 06/27/24   Hunsucker, Donnice SAUNDERS, MD  metoprolol  succinate (TOPROL -XL) 25 MG 24 hr tablet TAKE 1 TABLET EVERY DAY 10/03/23   Jordan, Peter M, MD  nitroGLYCERIN  (NITROSTAT ) 0.4 MG SL tablet Place 1 tablet (0.4 mg total) under the tongue as needed. 09/01/23   Jordan, Peter M, MD  Omega-3 Fatty Acids (FISH OIL) 500 MG CAPS Take 1 capsule by mouth daily at 6 (six) AM.    [provider]  Polyethyl Glycol-Propyl Glycol (SYSTANE ULTRA OP) Apply 1 drop to eye 2 (two) times daily.    [provider]  potassium chloride  SA (KLOR-CON  M20) 20 MEQ tablet Take 1 tablet (20 mEq total) by mouth daily. 09/01/23 03/08/24  Jordan, Peter M, MD  Respiratory Therapy Supplies (FLUTTER) DEVI Use as directed 07/30/19   Darlean Ozell NOVAK, MD  Rivaroxaban  (XARELTO ) 15 MG TABS tablet Take 1 tablet (15 mg total) by mouth daily. 03/19/24   Jordan, Peter  M, MD  silver  sulfADIAZINE  (SILVADENE ) 1 % cream Apply 1 Application topically daily as needed. 02/08/24   Hunsucker, Donnice SAUNDERS, MD  simvastatin  (ZOCOR ) 40 MG tablet TAKE 1 TABLET (40 MG TOTAL) BY MOUTH DAILY AT 6 PM. 12/19/23 03/08/24  Jordan, Peter M, MD  tamsulosin  (FLOMAX ) 0.4 MG CAPS capsule Take 0.4 mg by mouth daily after supper.    [provider]  VENTOLIN  HFA 108 (90 Base) MCG/ACT inhaler INHALE 2 PUFFS BY MOUTH EVERY 4 HOURS AS NEEDED FOR WHEEZING OR SHORTNESS OF BREATH 05/17/24   Hunsucker, Donnice SAUNDERS, MD    Allergies: Viagra [sildenafil] and Iodinated contrast media    Review of Systems  All other systems reviewed and are negative.   Updated Vital Signs BP (!) 150/75    Pulse 71   Temp 97.6 F (36.4 C) (Oral)   Resp 20   Ht 5' 10 (1.778 m)   Wt 74.8 kg   SpO2 100%   BMI 23.68 kg/m   Physical Exam Vitals and nursing note reviewed.  HENT:     Mouth/Throat:     Pharynx: Oropharynx is clear.  Cardiovascular:     Rate and Rhythm: Normal rate.     Pulses: Normal pulses.  Pulmonary:     Effort: Pulmonary effort is normal.     Breath sounds: Normal breath sounds.  Abdominal:     General: Abdomen is flat. Bowel sounds are normal.     Palpations: Abdomen is soft.  Skin:    General: Skin is warm and dry.  Neurological:     General: No focal deficit present.     Mental Status: He is alert.     GCS: GCS eye subscore is 4. GCS verbal subscore is 5. GCS motor subscore is 6.     Cranial Nerves: Cranial nerves 2-12 are intact.     Sensory: Sensation is intact.     Motor: Motor function is intact.     Coordination: Finger-Nose-Finger Test normal.     Comments: Patient had equal smile.  Patient had good strength in the upper extremities bilaterally.  Patient had good sensation, strength, pulses in bilateral lower extremities.     (all labs ordered are listed, but only abnormal results are displayed) Labs Reviewed  CBC  COMPREHENSIVE METABOLIC PANEL WITH GFR  URINALYSIS, ROUTINE W REFLEX MICROSCOPIC    EKG: None  Radiology: No results found.   Procedures   Medications Ordered in the ED - No data to display  Clinical Course as of 07/17/24 2329  Tue Jul 17, 2024  2327 Urinalysis, Routine w reflex microscopic -Urine, Clean Catch [EF]    Clinical Course User Index [EF] Rosaline Almarie MATSU, PA-C                                 Medical Decision Making Amount and/or Complexity of Data Reviewed Labs: ordered. Radiology: ordered.   Impression: 88 year old male presenting with right leg weakness.  Differential diagnoses include stroke, dehydration, hypokalemia, hypocalcemia  Additional History: Patient and wife are able to provide present  history.  I reviewed both outpatient and ER notes.  Labs: CBC shows no abnormalities. CMP shows no signs of hypokalemia or hypocalcemia.  Urinalysis showed pertinent findings.  Imaging: Head CT was ordered to look for signs of stroke and shows no new signs of stroke.  MRI brain and spine were ordered to look for any other causes of weakness.  Brain MRI shows no signs of acute hemorrhage or ischemic stroke.  MRI of the lumbar spine showed disc extrusion at L5-S1.  ED Course/Meds: Labs did not show any cause for his weakness.  Because of this CT of the head was ordered and showed no abnormalities because he was still having that weakness MRI was done to make sure there were no signs of stroke.  A MRI of lumbar was done to see if there were any causes of weakness there.  Attending recommended getting EKG and troponins due to weakness.  EKG showed no abnormalities however troponin was elevated at 57.  A second troponin was drawn and was 43.  Patient had no chest pain throughout his time in the ER.  Patient was educated to follow-up with his doctor that he seen for this before.      Final diagnoses:  None    ED Discharge Orders     None          Rosaline Almarie MATSU, PA-C 07/17/24 2345

## 2024-07-17 NOTE — ED Notes (Signed)
 Pt transported to MRI

## 2024-07-17 NOTE — ED Notes (Signed)
 Pt ambulated with walker. No complaints.

## 2024-07-17 NOTE — ED Notes (Signed)
 CCMD called and notified

## 2024-07-18 ENCOUNTER — Emergency Department (HOSPITAL_COMMUNITY)

## 2024-07-18 ENCOUNTER — Inpatient Hospital Stay (HOSPITAL_COMMUNITY)
Admission: EM | Admit: 2024-07-18 | Discharge: 2024-08-01 | DRG: 064 | Disposition: A | Discharge status: Skilled Nursing Facility

## 2024-07-18 ENCOUNTER — Inpatient Hospital Stay (HOSPITAL_COMMUNITY)

## 2024-07-18 ENCOUNTER — Encounter (HOSPITAL_COMMUNITY): Payer: Self-pay | Admitting: Emergency Medicine

## 2024-07-18 DIAGNOSIS — Z91041 Radiographic dye allergy status: Secondary | ICD-10-CM

## 2024-07-18 DIAGNOSIS — G8191 Hemiplegia, unspecified affecting right dominant side: Secondary | ICD-10-CM | POA: Diagnosis present

## 2024-07-18 DIAGNOSIS — E43 Unspecified severe protein-calorie malnutrition: Secondary | ICD-10-CM | POA: Diagnosis not present

## 2024-07-18 DIAGNOSIS — R2681 Unsteadiness on feet: Secondary | ICD-10-CM | POA: Diagnosis present

## 2024-07-18 DIAGNOSIS — Z515 Encounter for palliative care: Secondary | ICD-10-CM

## 2024-07-18 DIAGNOSIS — I6350 Cerebral infarction due to unspecified occlusion or stenosis of unspecified cerebral artery: Principal | ICD-10-CM | POA: Diagnosis present

## 2024-07-18 DIAGNOSIS — I480 Paroxysmal atrial fibrillation: Secondary | ICD-10-CM | POA: Diagnosis present

## 2024-07-18 DIAGNOSIS — I6980 Unspecified sequelae of other cerebrovascular disease: Secondary | ICD-10-CM | POA: Diagnosis not present

## 2024-07-18 DIAGNOSIS — D72829 Elevated white blood cell count, unspecified: Secondary | ICD-10-CM | POA: Diagnosis not present

## 2024-07-18 DIAGNOSIS — I3139 Other pericardial effusion (noninflammatory): Secondary | ICD-10-CM | POA: Diagnosis present

## 2024-07-18 DIAGNOSIS — D638 Anemia in other chronic diseases classified elsewhere: Secondary | ICD-10-CM | POA: Diagnosis present

## 2024-07-18 DIAGNOSIS — N401 Enlarged prostate with lower urinary tract symptoms: Secondary | ICD-10-CM | POA: Diagnosis not present

## 2024-07-18 DIAGNOSIS — Z87891 Personal history of nicotine dependence: Secondary | ICD-10-CM

## 2024-07-18 DIAGNOSIS — Z955 Presence of coronary angioplasty implant and graft: Secondary | ICD-10-CM

## 2024-07-18 DIAGNOSIS — K567 Ileus, unspecified: Secondary | ICD-10-CM | POA: Diagnosis not present

## 2024-07-18 DIAGNOSIS — R338 Other retention of urine: Secondary | ICD-10-CM | POA: Diagnosis not present

## 2024-07-18 DIAGNOSIS — S51011A Laceration without foreign body of right elbow, initial encounter: Secondary | ICD-10-CM | POA: Diagnosis present

## 2024-07-18 DIAGNOSIS — I252 Old myocardial infarction: Secondary | ICD-10-CM

## 2024-07-18 DIAGNOSIS — R531 Weakness: Secondary | ICD-10-CM | POA: Diagnosis present

## 2024-07-18 DIAGNOSIS — Z8249 Family history of ischemic heart disease and other diseases of the circulatory system: Secondary | ICD-10-CM

## 2024-07-18 DIAGNOSIS — Z7951 Long term (current) use of inhaled steroids: Secondary | ICD-10-CM

## 2024-07-18 DIAGNOSIS — I69391 Dysphagia following cerebral infarction: Secondary | ICD-10-CM | POA: Diagnosis not present

## 2024-07-18 DIAGNOSIS — I2510 Atherosclerotic heart disease of native coronary artery without angina pectoris: Secondary | ICD-10-CM | POA: Diagnosis present

## 2024-07-18 DIAGNOSIS — F05 Delirium due to known physiological condition: Secondary | ICD-10-CM | POA: Diagnosis not present

## 2024-07-18 DIAGNOSIS — I447 Left bundle-branch block, unspecified: Secondary | ICD-10-CM | POA: Diagnosis present

## 2024-07-18 DIAGNOSIS — S81012A Laceration without foreign body, left knee, initial encounter: Secondary | ICD-10-CM | POA: Diagnosis present

## 2024-07-18 DIAGNOSIS — R296 Repeated falls: Secondary | ICD-10-CM | POA: Diagnosis present

## 2024-07-18 DIAGNOSIS — Z6823 Body mass index (BMI) 23.0-23.9, adult: Secondary | ICD-10-CM

## 2024-07-18 DIAGNOSIS — J449 Chronic obstructive pulmonary disease, unspecified: Secondary | ICD-10-CM | POA: Diagnosis present

## 2024-07-18 DIAGNOSIS — Z7901 Long term (current) use of anticoagulants: Secondary | ICD-10-CM | POA: Diagnosis not present

## 2024-07-18 DIAGNOSIS — Z79899 Other long term (current) drug therapy: Secondary | ICD-10-CM

## 2024-07-18 DIAGNOSIS — I6381 Other cerebral infarction due to occlusion or stenosis of small artery: Secondary | ICD-10-CM | POA: Diagnosis not present

## 2024-07-18 DIAGNOSIS — E7800 Pure hypercholesterolemia, unspecified: Secondary | ICD-10-CM | POA: Diagnosis present

## 2024-07-18 DIAGNOSIS — I69344 Monoplegia of lower limb following cerebral infarction affecting left non-dominant side: Secondary | ICD-10-CM | POA: Diagnosis not present

## 2024-07-18 DIAGNOSIS — S51012A Laceration without foreign body of left elbow, initial encounter: Secondary | ICD-10-CM | POA: Diagnosis present

## 2024-07-18 DIAGNOSIS — R1312 Dysphagia, oropharyngeal phase: Secondary | ICD-10-CM | POA: Diagnosis present

## 2024-07-18 DIAGNOSIS — W19XXXA Unspecified fall, initial encounter: Secondary | ICD-10-CM | POA: Diagnosis present

## 2024-07-18 DIAGNOSIS — I639 Cerebral infarction, unspecified: Secondary | ICD-10-CM | POA: Diagnosis not present

## 2024-07-18 DIAGNOSIS — G252 Other specified forms of tremor: Secondary | ICD-10-CM | POA: Diagnosis present

## 2024-07-18 DIAGNOSIS — Z823 Family history of stroke: Secondary | ICD-10-CM

## 2024-07-18 DIAGNOSIS — R5381 Other malaise: Secondary | ICD-10-CM | POA: Diagnosis present

## 2024-07-18 DIAGNOSIS — Z66 Do not resuscitate: Secondary | ICD-10-CM | POA: Diagnosis not present

## 2024-07-18 DIAGNOSIS — I25810 Atherosclerosis of coronary artery bypass graft(s) without angina pectoris: Secondary | ICD-10-CM | POA: Diagnosis present

## 2024-07-18 DIAGNOSIS — Z888 Allergy status to other drugs, medicaments and biological substances status: Secondary | ICD-10-CM

## 2024-07-18 DIAGNOSIS — R7989 Other specified abnormal findings of blood chemistry: Secondary | ICD-10-CM | POA: Diagnosis present

## 2024-07-18 DIAGNOSIS — I10 Essential (primary) hypertension: Secondary | ICD-10-CM | POA: Diagnosis present

## 2024-07-18 DIAGNOSIS — R29700 NIHSS score 0: Secondary | ICD-10-CM | POA: Diagnosis present

## 2024-07-18 DIAGNOSIS — S80212A Abrasion, left knee, initial encounter: Secondary | ICD-10-CM | POA: Diagnosis present

## 2024-07-18 DIAGNOSIS — R471 Dysarthria and anarthria: Secondary | ICD-10-CM | POA: Diagnosis present

## 2024-07-18 DIAGNOSIS — I482 Chronic atrial fibrillation, unspecified: Secondary | ICD-10-CM | POA: Diagnosis not present

## 2024-07-18 DIAGNOSIS — Z7982 Long term (current) use of aspirin: Secondary | ICD-10-CM

## 2024-07-18 DIAGNOSIS — I493 Ventricular premature depolarization: Secondary | ICD-10-CM | POA: Diagnosis not present

## 2024-07-18 DIAGNOSIS — R1110 Vomiting, unspecified: Secondary | ICD-10-CM | POA: Diagnosis not present

## 2024-07-18 DIAGNOSIS — Z7189 Other specified counseling: Secondary | ICD-10-CM | POA: Diagnosis not present

## 2024-07-18 DIAGNOSIS — R29702 NIHSS score 2: Secondary | ICD-10-CM | POA: Diagnosis not present

## 2024-07-18 DIAGNOSIS — S81011A Laceration without foreign body, right knee, initial encounter: Secondary | ICD-10-CM | POA: Diagnosis present

## 2024-07-18 DIAGNOSIS — I6389 Other cerebral infarction: Secondary | ICD-10-CM | POA: Diagnosis not present

## 2024-07-18 DIAGNOSIS — S80211A Abrasion, right knee, initial encounter: Secondary | ICD-10-CM | POA: Diagnosis present

## 2024-07-18 LAB — COMPREHENSIVE METABOLIC PANEL WITH GFR
ALT: 8 U/L (ref 0–44)
AST: 20 U/L (ref 15–41)
Albumin: 4.1 g/dL (ref 3.5–5.0)
Alkaline Phosphatase: 90 U/L (ref 38–126)
Anion gap: 13 (ref 5–15)
BUN: 25 mg/dL — ABNORMAL HIGH (ref 8–23)
CO2: 24 mmol/L (ref 22–32)
Calcium: 9.5 mg/dL (ref 8.9–10.3)
Chloride: 102 mmol/L (ref 98–111)
Creatinine, Ser: 1.36 mg/dL — ABNORMAL HIGH (ref 0.61–1.24)
GFR, Estimated: 51 mL/min — ABNORMAL LOW (ref >60)
Glucose, Bld: 126 mg/dL — ABNORMAL HIGH (ref 70–99)
Potassium: 4.1 mmol/L (ref 3.5–5.1)
Sodium: 139 mmol/L (ref 135–145)
Total Bilirubin: 1 mg/dL (ref 0.0–1.2)
Total Protein: 6.3 g/dL — ABNORMAL LOW (ref 6.5–8.1)

## 2024-07-18 LAB — SEDIMENTATION RATE: Sed Rate: 10 mm/h (ref 0–16)

## 2024-07-18 LAB — CBC WITH DIFFERENTIAL/PLATELET
Abs Immature Granulocytes: 0.06 K/uL (ref 0.00–0.07)
Basophils Absolute: 0 K/uL (ref 0.0–0.1)
Basophils Relative: 0 %
Eosinophils Absolute: 0 K/uL (ref 0.0–0.5)
Eosinophils Relative: 0 %
HCT: 38.2 % — ABNORMAL LOW (ref 39.0–52.0)
Hemoglobin: 12.3 g/dL — ABNORMAL LOW (ref 13.0–17.0)
Immature Granulocytes: 1 %
Lymphocytes Relative: 5 %
Lymphs Abs: 0.5 K/uL — ABNORMAL LOW (ref 0.7–4.0)
MCH: 29.3 pg (ref 26.0–34.0)
MCHC: 32.2 g/dL (ref 30.0–36.0)
MCV: 91 fL (ref 80.0–100.0)
Monocytes Absolute: 0.6 K/uL (ref 0.1–1.0)
Monocytes Relative: 5 %
Neutro Abs: 10.2 K/uL — ABNORMAL HIGH (ref 1.7–7.7)
Neutrophils Relative %: 89 %
Platelets: 327 K/uL (ref 150–400)
RBC: 4.2 MIL/uL — ABNORMAL LOW (ref 4.22–5.81)
RDW: 13.9 % (ref 11.5–15.5)
WBC: 11.4 K/uL — ABNORMAL HIGH (ref 4.0–10.5)
nRBC: 0 % (ref 0.0–0.2)

## 2024-07-18 LAB — C-REACTIVE PROTEIN: CRP: <3 mg/dL — ABNORMAL HIGH (ref <1.0)

## 2024-07-18 MED ORDER — ZIPRASIDONE MESYLATE 20 MG IM SOLR
20.0000 mg | Freq: Once | INTRAMUSCULAR | Status: AC
  Administered 2024-07-18: 23:00:00 20 mg via INTRAMUSCULAR
  Filled 2024-07-18: qty 20

## 2024-07-18 MED ORDER — OLANZAPINE 10 MG IM SOLR
5.0000 mg | Freq: Once | INTRAMUSCULAR | Status: AC | PRN
  Administered 2024-07-19: 01:00:00 5 mg via INTRAMUSCULAR
  Filled 2024-07-18: qty 10

## 2024-07-18 MED ORDER — RIVAROXABAN 15 MG PO TABS
15.0000 mg | ORAL_TABLET | Freq: Every day | ORAL | Status: DC
  Administered 2024-07-18: 20:00:00 15 mg via ORAL
  Filled 2024-07-18: qty 1

## 2024-07-18 MED ORDER — ACETAMINOPHEN 650 MG RE SUPP: 650.0000 mg | RECTAL | Status: DC | PRN

## 2024-07-18 MED ORDER — HALOPERIDOL LACTATE 5 MG/ML IJ SOLN
5.0000 mg | Freq: Once | INTRAMUSCULAR | Status: AC | PRN
  Administered 2024-07-18: 22:00:00 5 mg via INTRAVENOUS
  Filled 2024-07-18: qty 1

## 2024-07-18 MED ORDER — ACETAMINOPHEN 325 MG PO TABS
650.0000 mg | ORAL_TABLET | ORAL | Status: DC | PRN
  Administered 2024-07-25 (×2): 650 mg via ORAL
  Filled 2024-07-18 (×2): qty 2

## 2024-07-18 MED ORDER — ACETAMINOPHEN 160 MG/5ML PO SOLN
650.0000 mg | ORAL | Status: DC | PRN
  Administered 2024-07-20 – 2024-07-27 (×8): 650 mg
  Filled 2024-07-18 (×8): qty 20.3

## 2024-07-18 MED ORDER — ASPIRIN 81 MG PO TBEC
81.0000 mg | DELAYED_RELEASE_TABLET | Freq: Every day | ORAL | Status: DC
  Filled 2024-07-18 (×2): qty 1

## 2024-07-18 MED ORDER — VITAMIN B-12 1000 MCG PO TABS
1000.0000 ug | ORAL_TABLET | Freq: Every day | ORAL | Status: DC
  Administered 2024-07-18: 20:00:00 1000 ug via ORAL
  Filled 2024-07-18 (×3): qty 1

## 2024-07-18 MED ORDER — SODIUM CHLORIDE 0.9 % IV SOLN: INTRAVENOUS | Status: AC

## 2024-07-18 MED ORDER — STROKE: EARLY STAGES OF RECOVERY BOOK
Freq: Once | Status: DC
  Filled 2024-07-18: qty 1

## 2024-07-18 MED ORDER — AMLODIPINE BESYLATE 5 MG PO TABS
5.0000 mg | ORAL_TABLET | Freq: Every day | ORAL | Status: DC
  Administered 2024-07-18: 20:00:00 5 mg via ORAL
  Filled 2024-07-18 (×3): qty 1

## 2024-07-18 NOTE — ED Notes (Signed)
 [facility] paged for Dr Emil by verbal order

## 2024-07-18 NOTE — H&P (Signed)
 History and Physical    Larry Briggs FMW:[id number] DOB: 08/16/1935 DOA: 07/18/2024  PCP: [name], MD   Chief Complaint: Extremity weakness  HPI: Larry Briggs is a 88 y.o. male with medical history significant of CAD, hypertension, hyperlipidemia who was in his normal state of health and then yesterday developed weakness on the right side and was unable to walk.  EMS was called and he was transported to the ER.  He had an MRI of the brain and lumbar spine which was negative.  Patient was discharged and returned to ER due to persistent right-sided weakness.  He was afebrile and hemodynamically stable on presentation.  Labs were obtained which showed WBC 8.2, hemoglobin 11.4, troponin 57, 43, urinalysis negative for infection.   Review of Systems: Review of Systems  Constitutional: Negative.   HENT: Negative.    Eyes: Negative.   Respiratory: Negative.    Cardiovascular: Negative.   Gastrointestinal: Negative.   Genitourinary: Negative.   Musculoskeletal: Negative.   Skin: Negative.   Neurological: Negative.   Endo/Heme/Allergies: Negative.   Psychiatric/Behavioral: Negative.       As per HPI otherwise 10 point review of systems negative.   Allergies[1]  Past Medical History:  Diagnosis Date   CAD (coronary artery disease)    Remote MI in 1980. CABG 1995. Stent to left main in 2004. Last cath in April of 2012. EF 50%; patent LIMA to DX/LAD with collateralization of the distal right, patent SVG to OM, occluded SVG to distal RCA, and patent stent to LAD   History of heart attack 1980   Hypercholesteremia    Hypertension    Increased glucose level    Obesity     Past Surgical History:  Procedure Laterality Date   CARDIAC CATHETERIZATION  1993   CARDIAC CATHETERIZATION  April 2012   Mild reduction if EF at 50%. Patent LIMA to DX/LAD with collateralization to distal RCA, patent SVG to OM and occluded SVG to distal right and patent stent to LAD/Left main;    CORONARY ARTERY BYPASS GRAFT  05/1994   x5 LIMA to LAD & DX, SVG to LCX, SVG to RCA   CORONARY STENT PLACEMENT  2004   Stent to the L main   INTRAMEDULLARY (IM) NAIL INTERTROCHANTERIC Left 09/05/2017   Procedure: INTRAMEDULLARY (IM) NAIL INTERTROCHANTRIC;  Surgeon: Beverley Evalene BIRCH, MD;  Location: [facility] OR;  Service: Orthopedics;  Laterality: Left;     reports that he quit smoking about 30 years ago. His smoking use included cigarettes. He started smoking about 71 years ago. He has a 30.7 pack-year smoking history. He has never used smokeless tobacco. He reports that he does not drink alcohol and does not use drugs.  Family History  Problem Relation Age of Onset   Heart attack Father    Hypertension Father    Stroke Mother     Prior to Admission medications  Medication Sig Start Date End Date Taking? Authorizing Provider  acetaminophen  (TYLENOL ) 500 MG tablet Take 500 mg by mouth at bedtime.   Yes [provider]  amLODipine  (NORVASC ) 5 MG tablet TAKE 1 TABLET EVERY DAY 05/14/24  Yes Jordan, Peter M, MD  azithromycin  (ZITHROMAX ) 250 MG tablet TAKE 1 TABLET 3 TIMES A WEEK ON MONDAY, WEDNESDAY, AND FRIDAY. 08/22/23  Yes Hunsucker, Donnice SAUNDERS, MD  Bioflavonoid Products (ESTER-C) 500-550 MG TABS Take 500 mg by mouth daily.   Yes [provider]  chlorthalidone  (HYGROTON ) 25 MG tablet TAKE 1 TABLET EVERY DAY 05/14/24  Yes Jordan, Peter M, MD  cyanocobalamin  (VITAMIN B12) 1000 MCG tablet Take 1,000 mcg by mouth daily.   Yes [provider]  docusate sodium  (COLACE) 100 MG capsule Take 100 mg by mouth as needed.   Yes [provider]  Fluticasone -Umeclidin-Vilant (TRELEGY ELLIPTA ) 100-62.5-25 MCG/ACT AEPB Inhale 1 puff into the lungs daily. 06/27/24  Yes Hunsucker, Donnice SAUNDERS, MD  guaiFENesin (MUCINEX) 600 MG 12 hr tablet Take 600 mg by mouth 2 (two) times daily as needed for to loosen phlegm.   Yes [provider]  metoprolol  succinate (TOPROL -XL) 25 MG 24  hr tablet TAKE 1 TABLET EVERY DAY 10/03/23  Yes Jordan, Peter M, MD  nitroGLYCERIN  (NITROSTAT ) 0.4 MG SL tablet Place 1 tablet (0.4 mg total) under the tongue as needed. 09/01/23  Yes Jordan, Peter M, MD  Omega-3 Fatty Acids (FISH OIL) 500 MG CAPS Take 1 capsule by mouth daily at 6 (six) AM.   Yes [provider]  Polyethyl Glycol-Propyl Glycol (SYSTANE ULTRA OP) Apply 1 drop to eye 2 (two) times daily.   Yes [provider]  potassium chloride  SA (KLOR-CON  M20) 20 MEQ tablet Take 1 tablet (20 mEq total) by mouth daily. 09/01/23 07/18/24 Yes Jordan, Peter M, MD  Rivaroxaban  (XARELTO ) 15 MG TABS tablet Take 1 tablet (15 mg total) by mouth daily. 03/19/24  Yes Jordan, Peter M, MD  silver  sulfADIAZINE  (SILVADENE ) 1 % cream Apply 1 Application topically daily as needed. 02/08/24  Yes Hunsucker, Donnice SAUNDERS, MD  simvastatin  (ZOCOR ) 40 MG tablet TAKE 1 TABLET (40 MG TOTAL) BY MOUTH DAILY AT 6 PM. 12/19/23 07/18/24 Yes Jordan, Peter M, MD  tamsulosin  (FLOMAX ) 0.4 MG CAPS capsule Take 0.4 mg by mouth daily after supper.   Yes [provider]  VENTOLIN  HFA 108 (90 Base) MCG/ACT inhaler INHALE 2 PUFFS BY MOUTH EVERY 4 HOURS AS NEEDED FOR WHEEZING OR SHORTNESS OF BREATH 05/17/24  Yes Hunsucker, Donnice SAUNDERS, MD  methylPREDNISolone  (MEDROL  DOSEPAK) 4 MG TBPK tablet Take as directed. 07/17/24   Rosaline Almarie MATSU, PA-C  Respiratory Therapy Supplies (FLUTTER) DEVI Use as directed 07/30/19   Darlean Ozell NOVAK, MD    Physical Exam: Vitals:   07/18/24 2035 07/18/24 2036 07/18/24 2037 07/18/24 2115  BP: (!) 142/62   108/74  Pulse: 85   88  Resp: (!) 22   (!) 29  Temp:   97.7 F (36.5 C)   TempSrc:   Oral   SpO2: 91% 99%  99%  Weight:       Physical Exam Vitals reviewed.  Constitutional:      Appearance: He is normal weight.  HENT:     Head: Normocephalic.     Nose: Nose normal.     Mouth/Throat:     Mouth: Mucous membranes are moist.     Pharynx: Oropharynx is clear.  Eyes:      Conjunctiva/sclera: Conjunctivae normal.     Pupils: Pupils are equal, round, and reactive to light.  Cardiovascular:     Rate and Rhythm: Normal rate and regular rhythm.     Pulses: Normal pulses.     Heart sounds: Normal heart sounds.  Pulmonary:     Effort: Pulmonary effort is normal.     Breath sounds: Normal breath sounds.  Abdominal:     General: Abdomen is flat. Bowel sounds are normal.     Palpations: Abdomen is soft.  Musculoskeletal:        General: Normal range of motion.  Cervical back: Normal range of motion.  Skin:    General: Skin is warm.     Capillary Refill: Capillary refill takes less than 2 seconds.  Neurological:     General: No focal deficit present.     Mental Status: He is alert.  Psychiatric:        Mood and Affect: Mood normal.       Labs on Admission: I have personally reviewed the patients's labs and imaging studies.  Assessment/Plan Principal Problem:   CVA (cerebral vascular accident) [facility])   # Concern for CVA - Patient presented with right lower extremity weakness.  Neurology was consulted during initial presentation and MR spine and brain were negative.  Patient presented again with worsening complaints.  Neurology evaluated patient and recommended repeat imaging and stroke workup  Plan: MRI brain PT/OT/speech evaluation Echocardiogram Hold Xarelto  and continue baby aspirin   # Hypertension-continue amlodipine   # CAD-continue aspirin   # Agitation-as needed Zyprexa   Admission status: Inpatient Telemetry  Certification: The appropriate patient status for this patient is INPATIENT. Inpatient status is judged to be reasonable and necessary in order to provide the required intensity of service to ensure the patient's safety. The patient's presenting symptoms, physical exam findings, and initial radiographic and laboratory data in the context of their chronic comorbidities is felt to place them at high risk for further clinical  deterioration. Furthermore, it is not anticipated that the patient will be medically stable for discharge from the hospital within 2 midnights of admission.   * I certify that at the point of admission it is my clinical judgment that the patient will require inpatient hospital care spanning beyond 2 midnights from the point of admission due to high intensity of service, high risk for further deterioration and high frequency of surveillance required.DEWAINE Lamar Dess MD [facility] If 7PM-7AM, please contact night-coverage [email or web address]  07/18/2024, 11:23 PM        [1]  Allergies Allergen Reactions   Viagra [Sildenafil]     Other reaction(s): blue haze   Iodinated Contrast Media Nausea And Vomiting and Rash

## 2024-07-18 NOTE — ED Notes (Signed)
 Per SORT tech, pt is bleeding from undressed skin tears all over lobby. Pt is on Xarelto .

## 2024-07-18 NOTE — Progress Notes (Signed)
 [facility] night cross cover note:  I was notified by RN that this patient is confused, restless, agitated, pulling at their telemetry leads, with these behaviors refractory to attempts at verbal redirection.  In the setting of associated interference with ongoing medical treatment posing potential harm to themself, I have placed order for haldol  5 mg iv x 1 dose prn for agitation.    Eva Pore, DO Hospitalist

## 2024-07-18 NOTE — Progress Notes (Signed)
 [facility] night cross cover note:   I was notified by the patient's RN that this patient has passed his nursing bedside swallow screen.  I subsequently added a regular diet order.  Additionally, I've ordered PT/OT consults in the setting of presenting right lower extremity weakness.     Eva Pore, DO Hospitalist

## 2024-07-18 NOTE — ED Provider Notes (Signed)
 [place] EMERGENCY DEPARTMENT AT [facility] Provider Note   CSN: [id number] Arrival date & time: 07/18/24  9468     Patient presents with: Extremity Weakness   Larry Briggs is a 88 y.o. male.   88 yo M with a cc of right sided weakness.  Patient was at his normal state of health and then yesterday felt like he did before he had his prior stroke.  He was able to go out to eat with his family member but then had developed worsening weakness on the right side and was unable to walk on his own and took an EMS transported here for evaluation.  Patient was seen and had an MRI of the brain and L-spine obtained.  He was able to take a couple steps per his wife with significant assistance and was discharged home.  When they got home he tried to get out of the car and fell again to the ground.  Continues to complain of right sided weakness.  Both his leg and his arm on that side.  He denies any pain limiting movement.  Denies headache denies neck pain.  He denies significant back pain.  Feels like he has been eating and drinking normally.  Denies recent medication changes.  He has been having issues getting up from a seated position especially if it has been going on for some time.  Usually is able to get up with some assistance and then ambulate.  He denies any issue washing his hair or combing his hair.  Denies any change in his vision or symptoms that worsen with chewing.   Extremity Weakness       Prior to Admission medications  Medication Sig Start Date End Date Taking? Authorizing Provider  acetaminophen  (TYLENOL ) 500 MG tablet Take 500 mg by mouth at bedtime.    [provider]  acetaminophen  (TYLENOL ) 650 MG CR tablet Take 650 mg by mouth every 8 (eight) hours as needed.    [provider]  amLODipine  (NORVASC ) 5 MG tablet TAKE 1 TABLET EVERY DAY 05/14/24   Jordan, Peter M, MD  azithromycin  (ZITHROMAX ) 250 MG tablet TAKE 1 TABLET 3 TIMES A WEEK ON MONDAY,  WEDNESDAY, AND FRIDAY. 08/22/23   Hunsucker, Donnice SAUNDERS, MD  Bioflavonoid Products (ESTER-C) 500-550 MG TABS Take 500 mg by mouth daily.    [provider]  chlorthalidone  (HYGROTON ) 25 MG tablet TAKE 1 TABLET EVERY DAY 05/14/24   Jordan, Peter M, MD  cyanocobalamin  (VITAMIN B12) 1000 MCG tablet Take 1,000 mcg by mouth daily.    [provider]  docusate sodium  (COLACE) 100 MG capsule Take 100 mg by mouth as needed.    [provider]  doxylamine, Sleep, (UNISOM) 25 MG tablet Take 25 mg by mouth at bedtime as needed.    [provider]  Fluocinolone Acetonide 0.01 % OIL 4 drops to affected ear(s) once or twice daily as needed for itching 12/08/23   [provider]  Fluticasone -Umeclidin-Vilant (TRELEGY ELLIPTA ) 100-62.5-25 MCG/ACT AEPB Inhale 1 puff into the lungs daily. 06/27/24   Hunsucker, Donnice SAUNDERS, MD  methylPREDNISolone  (MEDROL  DOSEPAK) 4 MG TBPK tablet Take as directed. 07/17/24   Rosaline Almarie MATSU, PA-C  metoprolol  succinate (TOPROL -XL) 25 MG 24 hr tablet TAKE 1 TABLET EVERY DAY 10/03/23   Jordan, Peter M, MD  nitroGLYCERIN  (NITROSTAT ) 0.4 MG SL tablet Place 1 tablet (0.4 mg total) under the tongue as needed. 09/01/23   Jordan, Peter M, MD  Omega-3 Fatty Acids (  FISH OIL) 500 MG CAPS Take 1 capsule by mouth daily at 6 (six) AM.    [provider]  Polyethyl Glycol-Propyl Glycol (SYSTANE ULTRA OP) Apply 1 drop to eye 2 (two) times daily.    [provider]  potassium chloride  SA (KLOR-CON  M20) 20 MEQ tablet Take 1 tablet (20 mEq total) by mouth daily. 09/01/23 03/08/24  Jordan, Peter M, MD  Respiratory Therapy Supplies (FLUTTER) DEVI Use as directed 07/30/19   Darlean Ozell NOVAK, MD  Rivaroxaban  (XARELTO ) 15 MG TABS tablet Take 1 tablet (15 mg total) by mouth daily. 03/19/24   Jordan, Peter M, MD  silver  sulfADIAZINE  (SILVADENE ) 1 % cream Apply 1 Application topically daily as needed. 02/08/24   Hunsucker, Donnice SAUNDERS, MD  simvastatin  (ZOCOR ) 40  MG tablet TAKE 1 TABLET (40 MG TOTAL) BY MOUTH DAILY AT 6 PM. 12/19/23 03/08/24  Jordan, Peter M, MD  tamsulosin  (FLOMAX ) 0.4 MG CAPS capsule Take 0.4 mg by mouth daily after supper.    [provider]  VENTOLIN  HFA 108 (90 Base) MCG/ACT inhaler INHALE 2 PUFFS BY MOUTH EVERY 4 HOURS AS NEEDED FOR WHEEZING OR SHORTNESS OF BREATH 05/17/24   Hunsucker, Donnice SAUNDERS, MD    Allergies: Viagra [sildenafil] and Iodinated contrast media    Review of Systems  Musculoskeletal:  Positive for extremity weakness.    Updated Vital Signs BP (!) 137/54 (BP Location: Left Arm)   Pulse 99   Temp (!) 97.3 F (36.3 C)   Resp (!) 22   Wt 74.8 kg   SpO2 100%   BMI 23.66 kg/m   Physical Exam Vitals and nursing note reviewed.  Constitutional:      Appearance: He is well-developed.  HENT:     Head: Normocephalic and atraumatic.  Eyes:     Pupils: Pupils are equal, round, and reactive to light.  Neck:     Vascular: No JVD.  Cardiovascular:     Rate and Rhythm: Normal rate and regular rhythm.     Heart sounds: No murmur heard.    No friction rub. No gallop.  Pulmonary:     Effort: No respiratory distress.     Breath sounds: No wheezing.  Abdominal:     General: There is no distension.     Tenderness: There is no abdominal tenderness. There is no guarding or rebound.  Musculoskeletal:        General: Normal range of motion.     Cervical back: Normal range of motion and neck supple.     Comments: Multiple skin tears mostly to the right leg right elbow.  Skin:    Coloration: Skin is not pale.     Findings: No rash.  Neurological:     Mental Status: He is alert and oriented to person, place, and time.     Comments: Drift with the right upper extremity.  Right lower extremity with 4-5 muscle strength compared to 5 out of 5 on the left.  Psychiatric:        Behavior: Behavior normal.     (all labs ordered are listed, but only abnormal results are displayed) Labs Reviewed  CBC WITH  DIFFERENTIAL/PLATELET - Abnormal; Notable for the following components:      Result Value   WBC 11.4 (*)    RBC 4.20 (*)    Hemoglobin 12.3 (*)    HCT 38.2 (*)    Neutro Abs 10.2 (*)    Lymphs Abs 0.5 (*)    All other components within  normal limits  COMPREHENSIVE METABOLIC PANEL WITH GFR - Abnormal; Notable for the following components:   Glucose, Bld 126 (*)    BUN 25 (*)    Creatinine, Ser 1.36 (*)    Total Protein 6.3 (*)    GFR, Estimated 51 (*)    All other components within normal limits  C-REACTIVE PROTEIN - Abnormal; Notable for the following components:   CRP <3.0 (*)    All other components within normal limits  SEDIMENTATION RATE    EKG: None  Radiology: MR LUMBAR SPINE WO CONTRAST Result Date: 07/17/2024 EXAM: MRI LUMBAR SPINE 07/17/2024 08:14:00 PM TECHNIQUE: Multiplanar multisequence MRI of the lumbar spine was performed without the administration of intravenous contrast. COMPARISON: None available. CLINICAL HISTORY: leg weakness FINDINGS: BONES AND ALIGNMENT: Moderate dextroscoliosis. Trace degenerative stepwise retrolisthesis of L2 and L3 through L4 and L5. Chronic compression deformities involving the L1 and L5 vertebral bodies. Normal vertebral body heights. Bone marrow signal intensity within normal limits. No worrisome osseous lesions. No acute or recent fracture. Mild degenerative reactive endplate change with marrow edema present about the L3-L4 interspace. SPINAL CORD: The conus medullaris terminates at the level of L1. SOFT TISSUES: No paraspinal mass. T12-L1: Degenerative intervertebral disc space narrowing with disc desiccation. Reactive endplate spurring. No stenosis. L1-L2: Degenerative intervertebral disc space narrowing with diffuse disc bulge. Reactive endplate spurring. Mild facet hypertrophy. Borderline mild narrowing of the left lateral recess. Central canal remains patent. No foraminal stenosis. L2-L3: Degenerative intervertebral disc space narrowing  with disc desiccation and diffuse disc bulge, asymmetric to the left. Reactive endplate spurring, also worse on the left. Superimposed left foraminal to extraforaminal disc protrusion contacts the exiting left L2 nerve root (series 17, image 16). Mild to moderate bilateral facet hypertrophy. Result in mild to moderate narrowing of the left lateral recess. Mild to moderate left L2 foraminal stenosis. L3-L4: Degenerative intervertebral disc space narrowing with diffuse disc bulge and disc desiccation. Reactive endplate spurring. Changes asymmetric to the left. Mild to moderate bilateral facet hypertrophy. Result in mild canal with bilateral subarticular stenosis. Mild right with moderate left L3 foraminal narrowing. L4-L5: Degenerative intervertebral disc space narrowing with disc desiccation and diffuse disc bulge. Reactive endplate spurring, worse on the right. Moderate right with mild left facet hypertrophy. Result in moderate right lateral recess stenosis. The central canal remains patent. Moderate right with mild left L4 foraminal narrowing. L5-S1: Disc bulge with disc desiccation. Reactive endplate spurring. Superimposed right subarticular to foraminal disc extrusion with superior migration. Disc material closely approximates both the right L5 and descending S1 nerve roots, either of which could be affected. Additional small left foraminal disc protrusion contacts the exiting left L5 nerve root (series 13, 15). Moderate right facet hypertrophy resulting in moderate narrowing of the right lateral recess. Central canal remains patent. Severe right with moderate left L5 hemorrhage. IMPRESSION: 1. No acute findings. 2. Right subarticular disc extrusion at L5-S1, potentially affecting the right L5 or descending S1 nerve roots. 3. Additional small left foraminal disc protrusion at L5-S1, potentially affecting the exiting left L5 nerve root. 4. Multifactorial degenerative changes at L4-L5 resulting in moderate right  lateral recess stenosis, with moderate right L4 foraminal narrowing. 5. Chronic compression deformities of L1 and L5. Electronically signed by: Morene Hoard MD 07/17/2024 09:15 PM EST RP Workstation: [id number]   MR BRAIN WO CONTRAST Result Date: 07/17/2024 EXAM: MRI BRAIN WITHOUT CONTRAST 07/17/2024 08:14:00 PM TECHNIQUE: Multiplanar multisequence MRI of the head/brain was performed without the administration of intravenous contrast. COMPARISON:  CT from earlier the same day. CLINICAL HISTORY: leg weakness FINDINGS: BRAIN AND VENTRICLES: Generalized age-related atrophy. Patchy T2/FLAIR hyperintensity involving the periventricular and T2 white matter as well as the pons, consistent with chronic small vessel ischemic disease, moderate in nature. Few superimposed remote lacunar infarcts present by the hemispheric cerebral white matter. Few punctate chronic microhemorrhages noted, likely small vessels hyperintense in nature. No acute infarct. No mass. No midline shift. No hydrocephalus. The sella is unremarkable. Normal flow voids. ORBITS: Prior bilateral lens replacement. No acute abnormality. SINUSES AND MASTOIDS: Mild scattered mucosal thickening about the right greater than left ethmoid air cells. BONES AND SOFT TISSUES: Normal marrow signal. No acute soft tissue abnormality. IMPRESSION: 1. No acute intracranial abnormality. 2. Generalized age-related atrophy with moderate chronic microvascular ischemic disease. Electronically signed by: Morene Hoard MD 07/17/2024 08:46 PM EST RP Workstation: [id number]   CT Head Wo Contrast Result Date: 07/17/2024 EXAM: CT HEAD WITHOUT CONTRAST 07/17/2024 05:00:00 PM TECHNIQUE: CT of the head was performed without the administration of intravenous contrast. Automated exposure control, iterative reconstruction, and/or weight based adjustment of the mA/kV was utilized to reduce the radiation dose to as low as reasonably achievable. COMPARISON: 12/04/2022.  CLINICAL HISTORY: weakness FINDINGS: BRAIN AND VENTRICLES: No acute hemorrhage. No evidence of acute infarct. No hydrocephalus. No extra-axial collection. No mass effect or midline shift. Moderate global atrophy and white matter hypoattenuation, status post prior examination, likely referral of small vessel ischemia. Intracranial vascular calcifications. ORBITS: No acute abnormality. SINUSES: No acute abnormality. Mastoid air cells and middle ear cavities are clear. SOFT TISSUES AND SKULL: No acute soft tissue abnormality. No skull fracture. IMPRESSION: 1. No acute intracranial abnormality. 2. Moderate global cerebral atrophy and chronic small vessel ischemic changes, with intracranial vascular calcifications. Electronically signed by: Dorethia Molt MD 07/17/2024 05:28 PM EST RP Workstation: [id number]     Procedures   Medications Ordered in the ED - No data to display                                  Medical Decision Making Amount and/or Complexity of Data Reviewed Labs: ordered. Radiology: ordered.   88 yo M with a cc of a fall. Patient with R sided weakness that started yesterday.  Seen in the ED with negative MRI brain and L spine.   Obvious weakness on exam.  I discussed case with Dr. Michaela, neurology.  He recommended obtaining an MRI of the C and T-spine and neurology would formally consult on the patient.  Repeat blood work here as well.  No acute anemia no significant electrolyte abnormalities.  Patient care was signed out to Dr. Pamella, please see their note for further details of care in the ED.  The patients results and plan were reviewed and discussed.   Any x-rays performed were independently reviewed by myself.   Differential diagnosis were considered with the presenting HPI.  Medications - No data to display  Vitals:   07/18/24 0534 07/18/24 0539 07/18/24 1041  BP:  126/68 (!) 137/54  Pulse:  (!) 101 99  Resp:  18 (!) 22  Temp:  97.8 F (36.6 C) (!) 97.3 F  (36.3 C)  TempSrc:  Oral   SpO2:  100% 100%  Weight: 74.8 kg      Final diagnoses:  Right sided weakness    Admission/ observation were discussed with the admitting physician, patient and/or family and they are comfortable with the  plan.       Final diagnoses:  Right sided weakness    ED Discharge Orders     None          Emil Share, DO 07/18/24 1521

## 2024-07-18 NOTE — ED Notes (Signed)
 Pt continues to be agitated and verbally aggressive with wife. Wants to get out of the bed. Continued attempts at redirection from nurse and wife at bedside.

## 2024-07-18 NOTE — ED Provider Triage Note (Signed)
 Emergency Medicine Provider Triage Evaluation Note  Larry Briggs , a 88 y.o. male  was evaluated in triage.  Pt complains of fall.  Just discharged last night.  Complains of persistent LE weakness.  Has new skin tears from the fall.  Doesn't feel like anything is broken.  Review of Systems  Positive: fall Negative:   Physical Exam  BP 126/68   Pulse (!) 101   Temp 97.8 F (36.6 C) (Oral)   Resp 18   Wt 74.8 kg   SpO2 100%   BMI 23.66 kg/m  Gen:   Awake, no distress   Resp:  Normal effort  MSK:   Moves extremities without difficulty  Other:  Skin tears  Medical Decision Making  Medically screening exam initiated at 5:50 AM.  Appropriate orders placed.  Larry Briggs was informed that the remainder of the evaluation will be completed by another provider, this initial triage assessment does not replace that evaluation, and the importance of remaining in the ED until their evaluation is complete.     Larry Charleston, PA-C 07/18/24 [phone]

## 2024-07-18 NOTE — ED Notes (Signed)
 Patient transported to MRI

## 2024-07-18 NOTE — ED Notes (Signed)
 Recliner provided to wife and pt medicated for increased restlessness and confusion.

## 2024-07-18 NOTE — Consult Note (Signed)
 NEUROLOGY CONSULT NOTE   Date of service: July 18, 2024 Patient Name: Larry Briggs MRN:  [id number] DOB:  20-Sep-1935 Chief Complaint: right leg weakness, falls Requesting Provider: Emil Share, DO  History of Present Illness  Larry Briggs is a 88 y.o. male with hx of PAF on Xarelto , CVA in May 2024 with reported residual left leg weakness, CAD s/p coronary bypass in 1995, s/p left main coronary artery stenting in 2004, HTN, HLD, and COPD who presented to the ED today after being discharged yesterday with ongoing complaints of right lower extremity weakness with multiple subsequent falls.  Patient presented to the ED yesterday with right leg weakness and buckling and a generalized feeling off.  After church, he was unable to climb the stairs of his home and fell onto his knees sustaining scabbing abrasions prompting an ED evaluation.  While in the ED, the patient was taken to imaging for MRI brain and lumbar spine without revealing etiology of his leg weakness.  At discharge, the patient fell getting out of his car once home and again on the way into their home.  Today, the patient experienced ongoing right lower extremity weakness and decided to come back for further evaluation.  At baseline from his stroke in May 2024, patient states that he is able to ambulate with a walker/cane but he is weaker in the left leg than the right.    ROS  Comprehensive ROS performed and pertinent positives documented in HPI   Past History   Past Medical History:  Diagnosis Date   CAD (coronary artery disease)    Remote MI in 1980. CABG 1995. Stent to left main in 2004. Last cath in April of 2012. EF 50%; patent LIMA to DX/LAD with collateralization of the distal right, patent SVG to OM, occluded SVG to distal RCA, and patent stent to LAD   History of heart attack 1980   Hypercholesteremia    Hypertension    Increased glucose level    Obesity    Past Surgical History:  Procedure Laterality  Date   CARDIAC CATHETERIZATION  1993   CARDIAC CATHETERIZATION  April 2012   Mild reduction if EF at 50%. Patent LIMA to DX/LAD with collateralization to distal RCA, patent SVG to OM and occluded SVG to distal right and patent stent to LAD/Left main;   CORONARY ARTERY BYPASS GRAFT  05/1994   x5 LIMA to LAD & DX, SVG to LCX, SVG to RCA   CORONARY STENT PLACEMENT  2004   Stent to the L main   INTRAMEDULLARY (IM) NAIL INTERTROCHANTERIC Left 09/05/2017   Procedure: INTRAMEDULLARY (IM) NAIL INTERTROCHANTRIC;  Surgeon: Beverley Evalene BIRCH, MD;  Location: [facility] OR;  Service: Orthopedics;  Laterality: Left;   Family History: Family History  Problem Relation Age of Onset   Heart attack Father    Hypertension Father    Stroke Mother    Social History  reports that he quit smoking about 30 years ago. His smoking use included cigarettes. He started smoking about 71 years ago. He has a 30.7 pack-year smoking history. He has never used smokeless tobacco. He reports that he does not drink alcohol and does not use drugs.  Allergies[1]  Medications  Current Medications[2]  Vitals   Vitals:   07/18/24 0534 07/18/24 0539 07/18/24 1041  BP:  126/68 (!) 137/54  Pulse:  (!) 101 99  Resp:  18 (!) 22  Temp:  97.8 F (36.6 C) (!) 97.3 F (36.3 C)  TempSrc:  Oral   SpO2:  100% 100%  Weight: 74.8 kg      Body mass index is 23.66 kg/m.  Physical Exam   Constitutional: Appears well-developed and well-nourished elderly male laying in ER hallway with scattered bruising and bleeding abrasions.  Psych: Affect appropriate to situation.  Calm and cooperative with exam Eyes: No scleral injection.  HENT: No OP obstruction.  Head: Normocephalic.  Cardiovascular: Normal rate and regular rhythm.  Respiratory: Effort normal, non-labored breathing on room air GI: Soft.  No distension. There is no tenderness.  Skin: Scattered abrasions and ecchymoses in bilateral upper and lower extremities.  Right upper  extremity wrapped with gauze with some obvious active bleeding through the gauze dressing on the right upper extremity.  Neurologic Examination  Mental Status: Patient is awake, alert, oriented to person, place, month, year, and situation. Patient is able to give some details regarding his history though he does at times give inconsistent history or conflicting details.  No signs of aphasia or neglect Cranial Nerves: II: Visual Fields are full. Pupils are equal, round, and reactive to light.   III,IV, VI: EOMI without ptosis or diploplia.  V: Facial sensation is intact and symmetric to light touch VII: Face is symmetric resting and with movement VIII: Hearing is intact to voice X: Palate elevates symmetrically, phonation intact XI: Shoulder shrug is symmetric. XII: Tongue protrudes midline  Motor: Tone is normal. Bulk is normal. Bilateral upper extremities elevate antigravity with drift on the right > minimal drift on the left. Bilateral biceps and triceps 5/5 strength. Shoulder extension 4-/5 on the right 4+/5 on the left.  Left lower extremity elevates antigravity without vertical drift.  Right lower extremity elevates antigravity with minimal vertical drift. Sensory: Patient reports that he has increased sensitivity/sensory disturbances in his bilateral arms and right leg but when assessed with light touch, he reports that the sensation is intact and symmetric.  Poor historian.  Deep Tendon Reflexes: 2+ and symmetric in the biceps and patellae.  Cerebellar: HKS with ataxia on the left lower extremity, FNF with ataxia on the right upper extremity out of proportion to the weakness  Labs/Imaging/Neurodiagnostic studies   CBC:  Recent Labs  Lab 08-16-24 1610 07/18/24 1402  WBC 8.2 11.4*  NEUTROABS  --  10.2*  HGB 11.4* 12.3*  HCT 35.6* 38.2*  MCV 88.1 91.0  PLT 325 327   Basic Metabolic Panel:  Lab Results  Component Value Date   NA 137 08/16/24   K 3.8 08/16/24   CO2  26 Aug 16, 2024   GLUCOSE 113 (H) 2024/08/16   BUN 20 2024/08/16   CREATININE 1.24 2024-08-16   CALCIUM  9.0 08/16/24   GFRNONAA 57 (L) 08/16/2024   GFRAA 77 07/10/2020   Lipid Panel:  Lab Results  Component Value Date   LDLCALC 80 12/05/2022   HgbA1c:  Lab Results  Component Value Date   HGBA1C 4.7 (L) 12/05/2022   Urine Drug Screen:     Component Value Date/Time   LABOPIA NONE DETECTED 12/05/2022 1003   COCAINSCRNUR NONE DETECTED 12/05/2022 1003   LABBENZ NONE DETECTED 12/05/2022 1003   AMPHETMU NONE DETECTED 12/05/2022 1003   THCU NONE DETECTED 12/05/2022 1003   LABBARB NONE DETECTED 12/05/2022 1003    Alcohol Level     Component Value Date/Time   ETH <10 12/05/2022 0024   INR  Lab Results  Component Value Date   INR 1.5 (H) 12/04/2022   APTT  Lab Results  Component Value Date   APTT  41 (H) 12/04/2022   AED levels: No results found for: PHENYTOIN, ZONISAMIDE, LAMOTRIGINE, LEVETIRACETA  CT Head without contrast (Personally reviewed): No acute intracranial abnormality.  Moderate global cerebral atrophy and chronic small vessel ischemic changes, with intracranial vascular calcifications.  MR Lumbar spine (Personally reviewed): 1. No acute findings. 2. Right subarticular disc extrusion at L5-S1, potentially affecting the right L5 or descending S1 nerve roots. 3. Additional small left foraminal disc protrusion at L5-S1, potentially affecting the exiting left L5 nerve root. 4. Multifactorial degenerative changes at L4-L5 resulting in moderate right lateral recess stenosis, with moderate right L4 foraminal narrowing. 5. Chronic compression deformities of L1 and L5.  MRI Brain (Personally reviewed): No acute intracranial abnormality.  Generalized age-related atrophy with moderate chronic microvascular ischemic disease.  MR Thoracic spine (Personally reviewed): 1. Remote superior endplate compression fractures at T1 (<20% height loss), T5 (~20% height  loss), and L1 (40% height loss), without retropulsed bone. 2. Moderate right foraminal stenosis at T9-T10 due to facet hypertrophy and asymmetric right-sided facet hypertrophy. 3. No other focal stenosis or cord changes to explain right lower extremity weakness.  MR Cervical spine (Personally reviewed): 1. Moderate left foraminal stenosis at C3-C4. 2. No significant central canal stenosis to explain lower extremity weakness.  ASSESSMENT   Larry Briggs is a 88 y.o. male with PMHx of PAF on Xarelto , CVA in May 2024 with residual left leg weakness, CAD s/p coronary bypass 1995, s/p left main coronary artery stenting in 2004, HTN, HLD, and COPD presenting with complaints of right lower extremity weakness and multiple recent falls.  Of note, patient was seen yesterday for the same with negative CT and lumbar spine imaging.  At discharge, patient fell multiple times getting into his house and remained weak today prompting further evaluation.  Neurology was consulted and MRI cervical and thoracic spine imaging were ordered without clear etiology for patient's presentation. I suspect the patient may have had an ischemic stroke not seen on previous MRI brain.  Will reorder MRI brain with thin slices through the brainstem and cerebellum for further evaluation.  RECOMMENDATIONS  - MRI brain without contrast repeat imaging - Stroke work up - A1c, lipid panel - Frequent neuro checks - Echocardiogram - Hold Xarelto  for now and use aspirin  81 mg, we will decide on timing for restarting based on imaging - PT/OT eval - Further recommendations pending imaging results ______________________________________________________________________  Signed, Mimi LELON Ny, NP [facility]   I have seen the patient reviewed the above note.  He has discoordination and weakness of the right side as well as dysarthria very much consistent with an acute ischemic stroke and I would favor treating him as  such.  I will repeat his MRI brain, given that cerebellar and brainstem strokes were the most commonly missed on MRI I will pursue thin cuts through the structures.  He will need to be admitted for further therapy.  Aisha Seals, MD [facility]   If 7pm- 7am, please page neurology on call as listed in AMION.     [1]  Allergies Allergen Reactions   Viagra [Sildenafil]     Other reaction(s): blue haze   Iodinated Contrast Media Nausea And Vomiting and Rash  [2] No current facility-administered medications for this encounter.  Current Outpatient Medications:    acetaminophen  (TYLENOL ) 500 MG tablet, Take 500 mg by mouth at bedtime., Disp: , Rfl:    acetaminophen  (TYLENOL ) 650 MG CR tablet, Take 650 mg by mouth every 8 (eight) hours as  needed., Disp: , Rfl:    amLODipine  (NORVASC ) 5 MG tablet, TAKE 1 TABLET EVERY DAY, Disp: 90 tablet, Rfl: 3   azithromycin  (ZITHROMAX ) 250 MG tablet, TAKE 1 TABLET 3 TIMES A WEEK ON MONDAY, WEDNESDAY, AND FRIDAY., Disp: 39 tablet, Rfl: 3   Bioflavonoid Products (ESTER-C) 500-550 MG TABS, Take 500 mg by mouth daily., Disp: , Rfl:    chlorthalidone  (HYGROTON ) 25 MG tablet, TAKE 1 TABLET EVERY DAY, Disp: 90 tablet, Rfl: 3   cyanocobalamin  (VITAMIN B12) 1000 MCG tablet, Take 1,000 mcg by mouth daily., Disp: , Rfl:    docusate sodium  (COLACE) 100 MG capsule, Take 100 mg by mouth as needed., Disp: , Rfl:    doxylamine, Sleep, (UNISOM) 25 MG tablet, Take 25 mg by mouth at bedtime as needed., Disp: , Rfl:    Fluocinolone Acetonide 0.01 % OIL, 4 drops to affected ear(s) once or twice daily as needed for itching, Disp: , Rfl:    Fluticasone -Umeclidin-Vilant (TRELEGY ELLIPTA ) 100-62.5-25 MCG/ACT AEPB, Inhale 1 puff into the lungs daily., Disp: 28 each, Rfl: 0   methylPREDNISolone  (MEDROL  DOSEPAK) 4 MG TBPK tablet, Take as directed., Disp: 21 tablet, Rfl: 0   metoprolol  succinate (TOPROL -XL) 25 MG 24 hr tablet, TAKE 1 TABLET EVERY DAY, Disp: 90  tablet, Rfl: 3   nitroGLYCERIN  (NITROSTAT ) 0.4 MG SL tablet, Place 1 tablet (0.4 mg total) under the tongue as needed., Disp: 30 tablet, Rfl: 3   Omega-3 Fatty Acids (FISH OIL) 500 MG CAPS, Take 1 capsule by mouth daily at 6 (six) AM., Disp: , Rfl:    Polyethyl Glycol-Propyl Glycol (SYSTANE ULTRA OP), Apply 1 drop to eye 2 (two) times daily., Disp: , Rfl:    potassium chloride  SA (KLOR-CON  M20) 20 MEQ tablet, Take 1 tablet (20 mEq total) by mouth daily., Disp: 30 tablet, Rfl: 11   Respiratory Therapy Supplies (FLUTTER) DEVI, Use as directed, Disp: 1 each, Rfl: 0   Rivaroxaban  (XARELTO ) 15 MG TABS tablet, Take 1 tablet (15 mg total) by mouth daily., Disp: 90 tablet, Rfl: 1   silver  sulfADIAZINE  (SILVADENE ) 1 % cream, Apply 1 Application topically daily as needed., Disp: 50 g, Rfl: 2   simvastatin  (ZOCOR ) 40 MG tablet, TAKE 1 TABLET (40 MG TOTAL) BY MOUTH DAILY AT 6 PM., Disp: 90 tablet, Rfl: 3   tamsulosin  (FLOMAX ) 0.4 MG CAPS capsule, Take 0.4 mg by mouth daily after supper., Disp: , Rfl:    VENTOLIN  HFA 108 (90 Base) MCG/ACT inhaler, INHALE 2 PUFFS BY MOUTH EVERY 4 HOURS AS NEEDED FOR WHEEZING OR SHORTNESS OF BREATH, Disp: 18 g, Rfl: 2

## 2024-07-18 NOTE — ED Notes (Signed)
 Pt taken to MRI

## 2024-07-18 NOTE — ED Notes (Signed)
 Pt continues to be agitated yelling and attempting to get out of bed. States has to have BM but refusing bedpan.

## 2024-07-18 NOTE — ED Provider Notes (Signed)
°  Physical Exam  BP (!) 147/78 (BP Location: Left Arm)   Pulse 95   Temp 97.7 F (36.5 C) (Oral)   Resp 16   Wt 74.8 kg   SpO2 97%   BMI 23.66 kg/m   Physical Exam Vitals and nursing note reviewed.  HENT:     Head: Normocephalic and atraumatic.  Eyes:     Pupils: Pupils are equal, round, and reactive to light.  Cardiovascular:     Rate and Rhythm: Normal rate and regular rhythm.  Pulmonary:     Effort: Pulmonary effort is normal.     Breath sounds: Normal breath sounds.  Abdominal:     Palpations: Abdomen is soft.     Tenderness: There is no abdominal tenderness.  Skin:    General: Skin is warm and dry.  Neurological:     Mental Status: He is alert.  Psychiatric:        Mood and Affect: Mood normal.     Procedures  Procedures  ED Course / MDM   Clinical Course as of 07/18/24 1833  Wed Jul 18, 2024  1808 MRI has resulted.  No clear findings that would explain patient's unilateral weakness.  Discussed with Dr. Michaela (neurology) who voices concern for ischemic stroke.  Will repeat MRI brain and admit to medicine.  Discussed with admitting hospitalist who accepts patient for admission [MP]    Clinical Course User Index [MP] Pamella Ozell LABOR, DO   Medical Decision Making I, Ozell Pamella DO, have assumed care of this patient from the previous provider pending MRI, and neurology consultation and likely admission  Amount and/or Complexity of Data Reviewed Labs: ordered. Radiology: ordered.  Risk Decision regarding hospitalization.          Pamella Ozell LABOR, DO 07/18/24 [phone]

## 2024-07-18 NOTE — ED Triage Notes (Addendum)
 Patient BIB [facility] c/o bilateral leg weakness.  Patient here yesterday for same with negative workup.  Patient reports his right leg is still weak and he's had 3 falls since going home.  Patient denies any pain, just reports multiple skin tears on arms and legs.  VSS per EMS. 12 lead shows a-fib, history of same.  Patient denies dizziness.  EMS reports stroke screen was negative. Patient denies hitting heads during falls.

## 2024-07-18 NOTE — ED Notes (Signed)
 Tech redressed skin tear  w/ tefla and gauze

## 2024-07-18 NOTE — ED Notes (Signed)
 Neurologist at bedside.

## 2024-07-18 NOTE — ED Notes (Signed)
 Pt skin tears redressed with Tefla and gauze

## 2024-07-19 ENCOUNTER — Inpatient Hospital Stay (HOSPITAL_COMMUNITY)

## 2024-07-19 DIAGNOSIS — I639 Cerebral infarction, unspecified: Secondary | ICD-10-CM | POA: Diagnosis not present

## 2024-07-19 DIAGNOSIS — I6381 Other cerebral infarction due to occlusion or stenosis of small artery: Secondary | ICD-10-CM

## 2024-07-19 DIAGNOSIS — I6389 Other cerebral infarction: Secondary | ICD-10-CM | POA: Diagnosis not present

## 2024-07-19 DIAGNOSIS — I482 Chronic atrial fibrillation, unspecified: Secondary | ICD-10-CM

## 2024-07-19 DIAGNOSIS — I69391 Dysphagia following cerebral infarction: Secondary | ICD-10-CM

## 2024-07-19 DIAGNOSIS — Z7901 Long term (current) use of anticoagulants: Secondary | ICD-10-CM | POA: Diagnosis not present

## 2024-07-19 DIAGNOSIS — R531 Weakness: Principal | ICD-10-CM

## 2024-07-19 DIAGNOSIS — I10 Essential (primary) hypertension: Secondary | ICD-10-CM | POA: Diagnosis not present

## 2024-07-19 DIAGNOSIS — Z87891 Personal history of nicotine dependence: Secondary | ICD-10-CM | POA: Diagnosis not present

## 2024-07-19 DIAGNOSIS — I69344 Monoplegia of lower limb following cerebral infarction affecting left non-dominant side: Secondary | ICD-10-CM | POA: Diagnosis not present

## 2024-07-19 LAB — LIPID PANEL
Cholesterol: 109 mg/dL (ref 0–200)
HDL: 58 mg/dL (ref >40)
LDL Cholesterol: 39 mg/dL (ref 0–99)
Total CHOL/HDL Ratio: 1.9 ratio
Triglycerides: 63 mg/dL (ref <150)
VLDL: 13 mg/dL (ref 0–40)

## 2024-07-19 LAB — ECHOCARDIOGRAM COMPLETE
AR max vel: 3.55 cm2
AV Area VTI: 2.61 cm2
AV Area mean vel: 2.48 cm2
AV Mean grad: 3.5 mmHg
AV Peak grad: 4.2 mmHg
Ao pk vel: 1.02 m/s
Area-P 1/2: 3.95 cm2
Calc EF: 62.8 %
S' Lateral: 2.7 cm
Single Plane A2C EF: 65.4 %
Single Plane A4C EF: 59.7 %
Weight: 2638.47 [oz_av]

## 2024-07-19 LAB — HEMOGLOBIN A1C
Hgb A1c MFr Bld: 5.4 % (ref 4.8–5.6)
Mean Plasma Glucose: 108.28 mg/dL

## 2024-07-19 MED ORDER — OLANZAPINE 10 MG IM SOLR
5.0000 mg | Freq: Four times a day (QID) | INTRAMUSCULAR | Status: AC | PRN
  Administered 2024-07-19 – 2024-07-20 (×2): 5 mg via INTRAMUSCULAR
  Filled 2024-07-19 (×3): qty 10

## 2024-07-19 MED ORDER — RIVAROXABAN 15 MG PO TABS
15.0000 mg | ORAL_TABLET | Freq: Every day | ORAL | Status: DC
  Administered 2024-07-19 – 2024-07-20 (×2): 15 mg via ORAL
  Filled 2024-07-19 (×2): qty 1

## 2024-07-19 MED ORDER — LORAZEPAM 2 MG/ML IJ SOLN
1.0000 mg | Freq: Once | INTRAMUSCULAR | Status: AC
  Administered 2024-07-19: 12:00:00 1 mg via INTRAVENOUS
  Filled 2024-07-19: qty 1

## 2024-07-19 MED ORDER — HYDRALAZINE HCL 20 MG/ML IJ SOLN
10.0000 mg | Freq: Once | INTRAMUSCULAR | Status: AC
  Administered 2024-07-19: 11:00:00 10 mg via INTRAVENOUS
  Filled 2024-07-19: qty 1

## 2024-07-19 MED ORDER — ONDANSETRON HCL 4 MG/2ML IJ SOLN
4.0000 mg | Freq: Four times a day (QID) | INTRAMUSCULAR | Status: DC | PRN
  Administered 2024-07-19: 06:00:00 4 mg via INTRAVENOUS
  Filled 2024-07-19 (×3): qty 2

## 2024-07-19 MED ORDER — SIMVASTATIN 20 MG PO TABS
40.0000 mg | ORAL_TABLET | Freq: Every day | ORAL | Status: DC
  Administered 2024-07-19 – 2024-07-20 (×2): 40 mg via ORAL
  Filled 2024-07-19 (×2): qty 2

## 2024-07-19 MED ORDER — STERILE WATER FOR INJECTION IJ SOLN
INTRAMUSCULAR | Status: AC
  Filled 2024-07-19: qty 10

## 2024-07-19 MED FILL — Water For Injection: INTRAMUSCULAR | Qty: 10 | Status: AC

## 2024-07-19 NOTE — Progress Notes (Signed)
 PT Cancellation Note  Patient Details Name: Larry Briggs MRN: [id number] DOB: 1936/07/29   Cancelled Treatment:    Reason Eval/Treat Not Completed: Patient at procedure or test/unavailable;Fatigue/lethargy limiting ability to participate (Pt was lethargic this morning and in MRI, pt was then in carotid study this afternoon. Will follow up as able and appropriate.)  [name], DPT, CLT  Acute Rehabilitation Services Office: [phone] (Secure chat preferred)  Dorothyann VEAR Maier 07/19/2024, 4:41 PM

## 2024-07-19 NOTE — ED Notes (Signed)
 Pt to vascular imaging via transport and then will be taken upstairs to his room

## 2024-07-19 NOTE — ED Notes (Signed)
 Pt continues to have intermittent agitation. Plucking and pulling.

## 2024-07-19 NOTE — Hospital Course (Signed)
 SABRA

## 2024-07-19 NOTE — ED Notes (Signed)
 Skin tears on R elbow and arm redressed. Gauze had rolled up and off.

## 2024-07-19 NOTE — Progress Notes (Signed)
2D echo complete 

## 2024-07-19 NOTE — Progress Notes (Signed)
 SLP Cancellation Note  Patient Details Name: Larry Briggs MRN: [id number] DOB: 1936-03-04   Cancelled treatment:       Reason Eval/Treat Not Completed: Medical issues which prohibited therapy. Per RN, pt currently combative and not appropriate for cognitive-linguistic evaluation. Will continue efforts.   Larry Briggs, [facility], CCC-SLP Speech Language Pathologist Office: [phone]  [name] 07/19/2024, 11:45 AM

## 2024-07-19 NOTE — ED Notes (Signed)
 Pt had episode of vomiting. Attending informed and pt cleaned.

## 2024-07-19 NOTE — Progress Notes (Addendum)
 STROKE TEAM PROGRESS NOTE    SIGNIFICANT HOSPITAL EVENTS 12/16 - pt came to ED for stroke workup, MRI negative, was discharged 12/17 - pt returned to ED after worsening of symptoms, repeat MRI positive  INTERIM HISTORY/SUBJECTIVE  Pt was sedated on exam. Report was that he had been having worsening agitation and MAR shows he has been receiving Zyprexa . Per wife, symptoms started Tuesday and have been worsening since then. MRI scan yesterday showed small 9 mm posterior limb internal capsule acute infarct interestingly MRI done 24 hours earlier has not shown the infarct the patient has the same symptoms. OBJECTIVE  CBC    Component Value Date/Time   WBC 11.4 (H) 07/18/2024 1402   RBC 4.20 (L) 07/18/2024 1402   HGB 12.3 (L) 07/18/2024 1402   HGB 14.6 07/10/2020 1423   HCT 38.2 (L) 07/18/2024 1402   HCT 41.3 07/10/2020 1423   PLT 327 07/18/2024 1402   PLT 205 07/10/2020 1423   MCV 91.0 07/18/2024 1402   MCV 97 07/10/2020 1423   MCH 29.3 07/18/2024 1402   MCHC 32.2 07/18/2024 1402   RDW 13.9 07/18/2024 1402   RDW 13.8 07/10/2020 1423   LYMPHSABS 0.5 (L) 07/18/2024 1402   LYMPHSABS 1.2 07/10/2020 1423   MONOABS 0.6 07/18/2024 1402   EOSABS 0.0 07/18/2024 1402   EOSABS 0.2 07/10/2020 1423   BASOSABS 0.0 07/18/2024 1402   BASOSABS 0.0 07/10/2020 1423    BMET    Component Value Date/Time   NA 139 07/18/2024 1402   NA 137 08/08/2023 1157   K 4.1 07/18/2024 1402   CL 102 07/18/2024 1402   CO2 24 07/18/2024 1402   GLUCOSE 126 (H) 07/18/2024 1402   BUN 25 (H) 07/18/2024 1402   BUN 19 08/08/2023 1157   CREATININE 1.36 (H) 07/18/2024 1402   CREATININE 0.86 03/09/2016 0926   CALCIUM  9.5 07/18/2024 1402   EGFR 53 (L) 08/08/2023 1157   GFRNONAA 51 (L) 07/18/2024 1402    IMAGING past 24 hours MR BRAIN WO CONTRAST Result Date: 07/18/2024 EXAM: MRI BRAIN WITHOUT CONTRAST 07/18/2024 07:35:00 PM TECHNIQUE: Multiplanar multisequence MRI of the head/brain was performed without the  administration of intravenous contrast. COMPARISON: MR head without contrast 07/17/2024. CLINICAL HISTORY: Stroke, follow up. FINDINGS: BRAIN AND VENTRICLES: An acute nonhemorrhagic 9 mm infarct is present within the posterior limb of the left internal capsule. T2 and FLAIR hyperintensities are associated with this infarct. Atrophy and white matter changes are otherwise stable. White matter changes extend into the brainstem. No intracranial hemorrhage. No mass. No midline shift. No hydrocephalus. The sella is unremarkable. Normal flow voids. ORBITS: Bilateral lens replacements are noted. The globes and orbits are otherwise within normal limits. SINUSES AND MASTOIDS: No acute abnormality. BONES AND SOFT TISSUES: Normal marrow signal. No acute soft tissue abnormality. IMPRESSION: 1. Acute/subacute nonhemorrhagic 9 mm infarct in the posterior limb of the left internal capsule with associated T2 and FLAIR hyperintensities. 2. Stable atrophy and white matter changes, including extension into the brainstem. Electronically signed by: Lonni Necessary MD 07/18/2024 08:00 PM EST RP Workstation: [id number]   MR THORACIC SPINE WO CONTRAST Result Date: 07/18/2024 EXAM: MR Thoracic Spine without 07/18/2024 04:25:32 PM TECHNIQUE: Multiplanar multisequence MRI of the thoracic spine was performed without the administration of intravenous contrast. COMPARISON: None available CLINICAL HISTORY: Back trauma, abnormal neuro exam, CT or xray positive (Age >= 16y). FINDINGS: BONES AND ALIGNMENT: Exaggerated kyphosis is present in the upper thoracic spine. Remote superior endplate compression fracture was present at T1  with less than 20% loss of height. A remote superior endplate fracture present at T5 with approximately 20% loss of height. A remote superior endplate fracture is present at L1 with 40% loss of height. No retropulsed bone is present. Marrow signal is unremarkable. SPINAL CORD: Normal spinal cord volume. Normal spinal  cord signal. SOFT TISSUES: Unremarkable. DEGENERATIVE CHANGES: Shallow central disc protrusions are present at T3-T4, T4-T5 and T6-T7 without significant stenosis. Facet hypertrophy and asymmetric right sided facet hypertrophy contributes to moderate right foraminal stenosis at T9-T10. Minimal disc bulging is present at T11-T12 without significant stenosis. . IMPRESSION: 1. Remote superior endplate compression fractures at T1 (<20% height loss), T5 (~20% height loss), and L1 (40% height loss), without retropulsed bone. 2. Moderate right foraminal stenosis at T9-T10 due to facet hypertrophy and asymmetric right-sided facet hypertrophy. 3. No other focal stenosis or cord changes to explain right lower extremity weakness. Electronically signed by: Lonni Necessary MD 07/18/2024 05:04 PM EST RP Workstation: [id number]   MR Cervical Spine Wo Contrast Result Date: 07/18/2024 EXAM: MRI CERVICAL SPINE WITHOUT CONTRAST 07/18/2024 04:25:11 PM TECHNIQUE: Multiplanar multisequence MRI of the cervical spine was performed. COMPARISON: None available. CLINICAL HISTORY: Right sided weakness. FINDINGS: BONES AND ALIGNMENT: Degenerative anterolisthesis is present at C5-C6. Remote superior endplate T1 fracture is present. Normal vertebral body heights. Bone marrow signal is unremarkable. SPINAL CORD: Normal spinal cord size. No abnormal spinal cord signal. SOFT TISSUES: No paraspinal mass. C2-C3: Moderate facet hypertrophy is present bilaterally without focal stenosis. No significant disc herniation. No spinal canal stenosis or neural foraminal narrowing. C3-C4: Mild disc bulging is present. Moderate facet hypertrophy is worse on the left. Moderate left foraminal stenosis is present. No spinal canal stenosis. C4-C5: A broad-based disc osteophyte complex partially effaces the ventral CSF. Moderate facet hypertrophy is present bilaterally. Mild bilateral foraminal narrowing is present. No spinal canal stenosis. C5-C6:  Degenerative anterolisthesis is present. Mild disc bulging is present without focal stenosis. No spinal canal stenosis or neural foraminal narrowing. C6-C7: A broad-based disc osteophyte complex is present. This effaces the ventral CSF. The foramina are patent bilaterally. No spinal canal stenosis. C7-T1: Remote superior endplate T1 fracture is present. No significant disc herniation. No spinal canal stenosis or neural foraminal narrowing. IMPRESSION: 1. Moderate left foraminal stenosis at C3-C4. 2. No significant central canal stenosis to explain lower extremity weakness. Electronically signed by: Lonni Necessary MD 07/18/2024 04:56 PM EST RP Workstation: [id number]    Vitals:   07/19/24 0600 07/19/24 0800 07/19/24 0845 07/19/24 1100  BP: 107/76 (!) 145/111  (!) 206/183  Pulse: 85  100 (!) 115  Resp: (!) 29  (!) 23 (!) 26  Temp:      TempSrc:      SpO2: 95%  98% 98%  Weight:         PHYSICAL EXAM General:  Sedated, asleep, well-nourished, well-developed patient in no acute distress Psych:  Unable to assess with sedation CV: Regular rate and rhythm on monitor Respiratory:  Regular, unlabored respirations on room air Skin: Significant bruising on bil arms.   NEURO:  Mental Status: Unable to assess with sedation Speech/Language: Unable to assess with sedation  Cranial Nerves: II: PERRL III, IV, VI, V, VII, VIII, IX, X, XI, XII: Unable to assess with sedation  Motor: unable to assess strength, was withdrawing each limb to pain and localizing to truncal pain, tone is appropriate.  Sensation, coordination, gait: unable to assess Gait- deferred      ASSESSMENT/PLAN  Larry Briggs  is a 88 y.o. male with history of CAD, HTN, HLD admitted for stroke.   Acute Ischemic Infarct:  posterior limg of the L itnernal capsule. Etiology:  small vessel disease though the patient has chronic A-fib and is on anticoagulation CT head: Negative.  MRA: Pending Carotid US : Pending MRI:  Acute nonhemorrhagic 9mm infarct in the posterior limb of the left internal capsule. Atrophy. 2D Echo: pending LDL pending HgbA1c pending VTE prophylaxis - SCDs Xarelto  (rivaroxaban ) daily prior to admission, now on aspirin  81 mg daily and Xarelto  (rivaroxaban ) daily Therapy recommendations:  Pending Disposition:  pending   Atrial fibrillation Home Meds: Xarelto , family reports great adherence Continue telemetry monitoring Continue anticoagulation with Xarelto , this was not a medication failure as the stroke is from a different etiology  Hypertension Home meds:  Norvasc , Chlorthalidone  Stable Blood Pressure Goal: SBP less than 160   Hyperlipidemia Home meds: Simvastatin , continue home meds LDL pending, goal < 70 Continue statin at discharge  Diabetes type II - pending Home meds: none HgbA1c  pending, goal < 7.0  Tobacco Abuse  Quit 30 years ago, ~30 pack history.  Dysphagia Patient has post-stroke dysphagia, SLP consulted    Diet   Diet NPO time specified  Advance diet as tolerated  Other Stroke Risk Factors ETOH use, alcohol level <10 Coronary artery disease   Hospital day # 1  Still pending multiple studies for etiological workup. Pt does not tolerate contrast dye well, so MRA and Carotid US  are appropriate replacements for CTA Head and Neck. Xarelto  was not a failure as the location of the stroke suggests that this is not cardioembolic.  CHARM Penne Mori, DO, PGY1 Neurology Stroke Team  I have personally obtained history,examined this patient, reviewed notes, independently viewed imaging studies, participated in medical decision making and plan of care.ROS completed by me personally and pertinent positives fully documented  I have made any additions or clarifications directly to the above note. Agree with note above.  Patient with prior left leg weakness due to prior stroke due to A-fib was on long-term anticoagulation with Xarelto  presented with right leg  weakness with initial MRI was negative for stroke and patient went home but got worse and came back and repeat MRI shows acute left internal capsule infarct likely from small vessel disease.  Continue Xarelto  and no need to add aspirin  due to increased bleeding risk.  Aggressive risk factor modification.  Physical Occupational Therapy consults.  Long discussion with patient wife at the bedside and answered questions.  Discussed with Dr. Sonjia   I personally spent a total of 50 minutes in the care of the patient today including getting/reviewing separately obtained history, performing a medically appropriate exam/evaluation, counseling and educating, placing orders, referring and communicating with other health care professionals, documenting clinical information in the EHR, independently interpreting results, and coordinating care.         Eather Popp, MD Medical Director [facility] Pager: [phone] 07/19/2024 1:54 PM   To contact Stroke Continuity provider, please refer to [email or web address]. After hours, contact General Neurology

## 2024-07-19 NOTE — Progress Notes (Signed)
° °  Brief Progress Note   _____________________________________________________________________________________________________________  Patient Name: [name] Patient DOB: July 26, 1936 Date: @TODAY @      Data: Reviewed vital signs, labs, and clinical notes.    Action: No action required at this time.    Response:  Neuro consulted.  _____________________________________________________________________________________________________________  [facility] RN Expeditor Laquilla Dault S Ashraf Mesta Please contact us  directly via secure chat (search for [facility]) or by calling us  at [phone] [facility]).

## 2024-07-19 NOTE — ED Notes (Signed)
 Patient is resting comfortably.

## 2024-07-19 NOTE — Progress Notes (Signed)
 PROGRESS NOTE  LEE KUANG FMW:[id number] DOB: Jun 24, 1936 DOA: 07/18/2024 PCP: [name], MD   LOS: 1 day   Brief narrative:  Larry Briggs is a 88 y.o. male with past medical history significant of CAD, hypertension, hyperlipidemia presented to hospital with weakness on the right side with difficulty ambulating.  Patient was brought into the ED patient and had MRI of the brain and lumbar spine which was negative.  Patient was then discharged and returned to ER due to persistent right-sided weakness.  In the ED, patient was afebrile and hemodynamically stable.  Labs were notable for WBC 8.2, hemoglobin 11.4, troponin 57, 43, urinalysis negative for infection.  Patient was then admitted to the hospital for further evaluation and treatment.  Assessment/Plan: Principal Problem:   CVA (cerebral vascular accident) [facility])  Acute CVA Neurology on board.  Underwent repeat MRI of the brain on 07/19/2024 with acute/subacute nonhemorrhagic 9 mm infarct in the posterior limb of the left internal capsule.  Continue PT, OT speech evaluation.  Check 2D echocardiogram.  Xarelto  on hold.  Continue aspirin  as per neurology..  Continue CIWA protocol.  Currently NPO.  Check hemoglobin A1c lipid panel.  On IV fluids at this time due to n.p.o. status.  Will add aspiration and fall precautions.  Agitation confusion.  Likely secondary to stroke.  Will continue to monitor closely.  Supportive care.  Appearance of picture  Essential hypertension-continue amlodipine    CAD-continue aspirin    DVT prophylaxis: SCD's Start: 07/18/24 2339 SCDs Start: 07/18/24 1816   Disposition: Uncertain at this time.  Will get PT OT evaluation.  Status is: Inpatient Remains inpatient appropriate because: Pending clinical improvement, possible need for placement    Code Status:     Code Status: Full Code  Family Communication: Spoke with the patient's spouse at  bedside  Consultants: Neurology  Procedures: None  Anti-infectives:  None  Anti-infectives (From admission, onward)    None        Subjective: Today, patient was seen and examined at bedside.  Patient appears to be slightly agitated and mildly sedated.  Patient's wife at bedside.  Objective: Vitals:   07/19/24 0800 07/19/24 0845  BP: (!) 145/111   Pulse:  100  Resp:  (!) 23  Temp:    SpO2:  98%    Intake/Output Summary (Last 24 hours) at 07/19/2024 1040 Last data filed at 07/19/2024 0136 Gross per 24 hour  Intake --  Output 800 ml  Net -800 ml   Filed Weights   07/18/24 0534  Weight: 74.8 kg   Body mass index is 23.66 kg/m.   Physical Exam: GENERAL: Patient is mildly somnolent but agitated, elderly male, HENT: No scleral pallor or icterus. Pupils equally reactive to light. Oral mucosa is moist NECK: is supple, no gross swelling noted. CHEST: Clear to auscultation. No crackles or wheezes.  Diminished breath sounds bilaterally.  Sternal scar noted CVS: S1 and S2 heard, no murmur. Regular rate and rhythm.  ABDOMEN: Soft, non-tender, bowel sounds are present. EXTREMITIES: No edema. CNS: Mild somnolent.  Right lower extremity weakness noted SKIN: warm and dry without rashes.  Data Review: I have personally reviewed the following laboratory data and studies,  CBC: Recent Labs  Lab 07/17/24 1610 07/18/24 1402  WBC 8.2 11.4*  NEUTROABS  --  10.2*  HGB 11.4* 12.3*  HCT 35.6* 38.2*  MCV 88.1 91.0  PLT 325 327   Basic Metabolic Panel: Recent Labs  Lab 07/17/24 1610 07/18/24 1402  NA 137 139  K 3.8 4.1  CL 102 102  CO2 26 24  GLUCOSE 113* 126*  BUN 20 25*  CREATININE 1.24 1.36*  CALCIUM  9.0 9.5   Liver Function Tests: Recent Labs  Lab 07/17/24 1610 07/18/24 1402  AST 18 20  ALT 7 8  ALKPHOS 81 90  BILITOT 0.6 1.0  PROT 6.0* 6.3*  ALBUMIN  3.9 4.1   No results for input(s): LIPASE, AMYLASE in the last 168 hours. No results for  input(s): AMMONIA in the last 168 hours. Cardiac Enzymes: No results for input(s): CKTOTAL, CKMB, CKMBINDEX, TROPONINI in the last 168 hours. BNP (last 3 results) No results for input(s): BNP in the last 8760 hours.  ProBNP (last 3 results) No results for input(s): PROBNP in the last 8760 hours.  CBG: No results for input(s): GLUCAP in the last 168 hours. No results found for this or any previous visit (from the past 240 hours).   Studies: MR BRAIN WO CONTRAST Result Date: 07/18/2024 EXAM: MRI BRAIN WITHOUT CONTRAST 07/18/2024 07:35:00 PM TECHNIQUE: Multiplanar multisequence MRI of the head/brain was performed without the administration of intravenous contrast. COMPARISON: MR head without contrast 07/17/2024. CLINICAL HISTORY: Stroke, follow up. FINDINGS: BRAIN AND VENTRICLES: An acute nonhemorrhagic 9 mm infarct is present within the posterior limb of the left internal capsule. T2 and FLAIR hyperintensities are associated with this infarct. Atrophy and white matter changes are otherwise stable. White matter changes extend into the brainstem. No intracranial hemorrhage. No mass. No midline shift. No hydrocephalus. The sella is unremarkable. Normal flow voids. ORBITS: Bilateral lens replacements are noted. The globes and orbits are otherwise within normal limits. SINUSES AND MASTOIDS: No acute abnormality. BONES AND SOFT TISSUES: Normal marrow signal. No acute soft tissue abnormality. IMPRESSION: 1. Acute/subacute nonhemorrhagic 9 mm infarct in the posterior limb of the left internal capsule with associated T2 and FLAIR hyperintensities. 2. Stable atrophy and white matter changes, including extension into the brainstem. Electronically signed by: Lonni Necessary MD 07/18/2024 08:00 PM EST RP Workstation: [id number]   MR THORACIC SPINE WO CONTRAST Result Date: 07/18/2024 EXAM: MR Thoracic Spine without 07/18/2024 04:25:32 PM TECHNIQUE: Multiplanar multisequence MRI of the  thoracic spine was performed without the administration of intravenous contrast. COMPARISON: None available CLINICAL HISTORY: Back trauma, abnormal neuro exam, CT or xray positive (Age >= 16y). FINDINGS: BONES AND ALIGNMENT: Exaggerated kyphosis is present in the upper thoracic spine. Remote superior endplate compression fracture was present at T1 with less than 20% loss of height. A remote superior endplate fracture present at T5 with approximately 20% loss of height. A remote superior endplate fracture is present at L1 with 40% loss of height. No retropulsed bone is present. Marrow signal is unremarkable. SPINAL CORD: Normal spinal cord volume. Normal spinal cord signal. SOFT TISSUES: Unremarkable. DEGENERATIVE CHANGES: Shallow central disc protrusions are present at T3-T4, T4-T5 and T6-T7 without significant stenosis. Facet hypertrophy and asymmetric right sided facet hypertrophy contributes to moderate right foraminal stenosis at T9-T10. Minimal disc bulging is present at T11-T12 without significant stenosis. . IMPRESSION: 1. Remote superior endplate compression fractures at T1 (<20% height loss), T5 (~20% height loss), and L1 (40% height loss), without retropulsed bone. 2. Moderate right foraminal stenosis at T9-T10 due to facet hypertrophy and asymmetric right-sided facet hypertrophy. 3. No other focal stenosis or cord changes to explain right lower extremity weakness. Electronically signed by: Lonni Necessary MD 07/18/2024 05:04 PM EST RP Workstation: [id number]   MR Cervical Spine Wo Contrast Result Date: 07/18/2024 EXAM: MRI CERVICAL SPINE WITHOUT  CONTRAST 07/18/2024 04:25:11 PM TECHNIQUE: Multiplanar multisequence MRI of the cervical spine was performed. COMPARISON: None available. CLINICAL HISTORY: Right sided weakness. FINDINGS: BONES AND ALIGNMENT: Degenerative anterolisthesis is present at C5-C6. Remote superior endplate T1 fracture is present. Normal vertebral body heights. Bone marrow signal  is unremarkable. SPINAL CORD: Normal spinal cord size. No abnormal spinal cord signal. SOFT TISSUES: No paraspinal mass. C2-C3: Moderate facet hypertrophy is present bilaterally without focal stenosis. No significant disc herniation. No spinal canal stenosis or neural foraminal narrowing. C3-C4: Mild disc bulging is present. Moderate facet hypertrophy is worse on the left. Moderate left foraminal stenosis is present. No spinal canal stenosis. C4-C5: A broad-based disc osteophyte complex partially effaces the ventral CSF. Moderate facet hypertrophy is present bilaterally. Mild bilateral foraminal narrowing is present. No spinal canal stenosis. C5-C6: Degenerative anterolisthesis is present. Mild disc bulging is present without focal stenosis. No spinal canal stenosis or neural foraminal narrowing. C6-C7: A broad-based disc osteophyte complex is present. This effaces the ventral CSF. The foramina are patent bilaterally. No spinal canal stenosis. C7-T1: Remote superior endplate T1 fracture is present. No significant disc herniation. No spinal canal stenosis or neural foraminal narrowing. IMPRESSION: 1. Moderate left foraminal stenosis at C3-C4. 2. No significant central canal stenosis to explain lower extremity weakness. Electronically signed by: Lonni Necessary MD 07/18/2024 04:56 PM EST RP Workstation: [id number]   MR LUMBAR SPINE WO CONTRAST Result Date: 07/17/2024 EXAM: MRI LUMBAR SPINE 07/17/2024 08:14:00 PM TECHNIQUE: Multiplanar multisequence MRI of the lumbar spine was performed without the administration of intravenous contrast. COMPARISON: None available. CLINICAL HISTORY: leg weakness FINDINGS: BONES AND ALIGNMENT: Moderate dextroscoliosis. Trace degenerative stepwise retrolisthesis of L2 and L3 through L4 and L5. Chronic compression deformities involving the L1 and L5 vertebral bodies. Normal vertebral body heights. Bone marrow signal intensity within normal limits. No worrisome osseous lesions. No  acute or recent fracture. Mild degenerative reactive endplate change with marrow edema present about the L3-L4 interspace. SPINAL CORD: The conus medullaris terminates at the level of L1. SOFT TISSUES: No paraspinal mass. T12-L1: Degenerative intervertebral disc space narrowing with disc desiccation. Reactive endplate spurring. No stenosis. L1-L2: Degenerative intervertebral disc space narrowing with diffuse disc bulge. Reactive endplate spurring. Mild facet hypertrophy. Borderline mild narrowing of the left lateral recess. Central canal remains patent. No foraminal stenosis. L2-L3: Degenerative intervertebral disc space narrowing with disc desiccation and diffuse disc bulge, asymmetric to the left. Reactive endplate spurring, also worse on the left. Superimposed left foraminal to extraforaminal disc protrusion contacts the exiting left L2 nerve root (series 17, image 16). Mild to moderate bilateral facet hypertrophy. Result in mild to moderate narrowing of the left lateral recess. Mild to moderate left L2 foraminal stenosis. L3-L4: Degenerative intervertebral disc space narrowing with diffuse disc bulge and disc desiccation. Reactive endplate spurring. Changes asymmetric to the left. Mild to moderate bilateral facet hypertrophy. Result in mild canal with bilateral subarticular stenosis. Mild right with moderate left L3 foraminal narrowing. L4-L5: Degenerative intervertebral disc space narrowing with disc desiccation and diffuse disc bulge. Reactive endplate spurring, worse on the right. Moderate right with mild left facet hypertrophy. Result in moderate right lateral recess stenosis. The central canal remains patent. Moderate right with mild left L4 foraminal narrowing. L5-S1: Disc bulge with disc desiccation. Reactive endplate spurring. Superimposed right subarticular to foraminal disc extrusion with superior migration. Disc material closely approximates both the right L5 and descending S1 nerve roots, either of  which could be affected. Additional small left foraminal disc protrusion contacts the  exiting left L5 nerve root (series 13, 15). Moderate right facet hypertrophy resulting in moderate narrowing of the right lateral recess. Central canal remains patent. Severe right with moderate left L5 hemorrhage. IMPRESSION: 1. No acute findings. 2. Right subarticular disc extrusion at L5-S1, potentially affecting the right L5 or descending S1 nerve roots. 3. Additional small left foraminal disc protrusion at L5-S1, potentially affecting the exiting left L5 nerve root. 4. Multifactorial degenerative changes at L4-L5 resulting in moderate right lateral recess stenosis, with moderate right L4 foraminal narrowing. 5. Chronic compression deformities of L1 and L5. Electronically signed by: Morene Hoard MD 07/17/2024 09:15 PM EST RP Workstation: [id number]   MR BRAIN WO CONTRAST Result Date: 07/17/2024 EXAM: MRI BRAIN WITHOUT CONTRAST 07/17/2024 08:14:00 PM TECHNIQUE: Multiplanar multisequence MRI of the head/brain was performed without the administration of intravenous contrast. COMPARISON: CT from earlier the same day. CLINICAL HISTORY: leg weakness FINDINGS: BRAIN AND VENTRICLES: Generalized age-related atrophy. Patchy T2/FLAIR hyperintensity involving the periventricular and T2 white matter as well as the pons, consistent with chronic small vessel ischemic disease, moderate in nature. Few superimposed remote lacunar infarcts present by the hemispheric cerebral white matter. Few punctate chronic microhemorrhages noted, likely small vessels hyperintense in nature. No acute infarct. No mass. No midline shift. No hydrocephalus. The sella is unremarkable. Normal flow voids. ORBITS: Prior bilateral lens replacement. No acute abnormality. SINUSES AND MASTOIDS: Mild scattered mucosal thickening about the right greater than left ethmoid air cells. BONES AND SOFT TISSUES: Normal marrow signal. No acute soft tissue abnormality.  IMPRESSION: 1. No acute intracranial abnormality. 2. Generalized age-related atrophy with moderate chronic microvascular ischemic disease. Electronically signed by: Morene Hoard MD 07/17/2024 08:46 PM EST RP Workstation: [id number]   CT Head Wo Contrast Result Date: 07/17/2024 EXAM: CT HEAD WITHOUT CONTRAST 07/17/2024 05:00:00 PM TECHNIQUE: CT of the head was performed without the administration of intravenous contrast. Automated exposure control, iterative reconstruction, and/or weight based adjustment of the mA/kV was utilized to reduce the radiation dose to as low as reasonably achievable. COMPARISON: 12/04/2022. CLINICAL HISTORY: weakness FINDINGS: BRAIN AND VENTRICLES: No acute hemorrhage. No evidence of acute infarct. No hydrocephalus. No extra-axial collection. No mass effect or midline shift. Moderate global atrophy and white matter hypoattenuation, status post prior examination, likely referral of small vessel ischemia. Intracranial vascular calcifications. ORBITS: No acute abnormality. SINUSES: No acute abnormality. Mastoid air cells and middle ear cavities are clear. SOFT TISSUES AND SKULL: No acute soft tissue abnormality. No skull fracture. IMPRESSION: 1. No acute intracranial abnormality. 2. Moderate global cerebral atrophy and chronic small vessel ischemic changes, with intracranial vascular calcifications. Electronically signed by: Dorethia Molt MD 07/17/2024 05:28 PM EST RP Workstation: [id number]      Vernal Alstrom, MD  [facility] 07/19/2024  If 7PM-7AM, please contact night-coverage

## 2024-07-19 NOTE — Progress Notes (Signed)
 OT Cancellation Note  Patient Details Name: JAYION SCHNECK MRN: [id number] DOB: 12/24/1935   Cancelled Treatment:    Reason Eval/Treat Not Completed: Medical issues which prohibited therapy;Patient at procedure or test/ unavailable. Attempted x 2, pt unable to participate first visit due lethargy, second attempt pt was in MRI. Will continue to follow.   Kennth Mliss Helling 07/19/2024, 1:35 PM Mliss HERO, OTR/L Acute Rehabilitation Services Office: [phone]

## 2024-07-19 NOTE — ED Notes (Signed)
 Pt continues to be agitated with any stimulation. Pt cursing at staff and wife and also attempting to hit staff. Pt placed in soft green safety mittens. Wife at bedside. Pt bladder scanned - . Pt offered water , but he refused. Pt has no stated needs at this time. MD Pokhrel notified about pt's status and will be addressing pt's hypertension.

## 2024-07-19 NOTE — ED Notes (Addendum)
 Pt rested for a bit but is now up and still confused. Pt continues to talk about having to urinate. Pt has not urinated since taking over care.

## 2024-07-19 NOTE — Significant Event (Signed)
 Larry Briggs arrived to [address] 70 accompanied via wife. All wound pics taken and in chart. He is alert but unable to keep his eyes open for long during assessment. HE also noted with cough during his pills. MD aware to keep NPO until more awake.

## 2024-07-19 NOTE — Progress Notes (Signed)
 Carotid duplex has been completed.   Results can be found under chart review under CV PROC. 07/19/2024 3:38 PM Mable Dara RVT, RDMS

## 2024-07-19 NOTE — Progress Notes (Signed)
 [facility] night cross cover note:   Prn iv zofran  added for n/v following episode of pt vomiting.     Eva Pore, DO Hospitalist

## 2024-07-19 NOTE — ED Notes (Signed)
Pt with transport to MRI

## 2024-07-20 ENCOUNTER — Inpatient Hospital Stay (HOSPITAL_COMMUNITY)

## 2024-07-20 DIAGNOSIS — I10 Essential (primary) hypertension: Secondary | ICD-10-CM | POA: Diagnosis not present

## 2024-07-20 DIAGNOSIS — Z7901 Long term (current) use of anticoagulants: Secondary | ICD-10-CM | POA: Diagnosis not present

## 2024-07-20 DIAGNOSIS — I6381 Other cerebral infarction due to occlusion or stenosis of small artery: Secondary | ICD-10-CM | POA: Diagnosis not present

## 2024-07-20 DIAGNOSIS — Z87891 Personal history of nicotine dependence: Secondary | ICD-10-CM | POA: Diagnosis not present

## 2024-07-20 DIAGNOSIS — I69344 Monoplegia of lower limb following cerebral infarction affecting left non-dominant side: Secondary | ICD-10-CM | POA: Diagnosis not present

## 2024-07-20 DIAGNOSIS — I69391 Dysphagia following cerebral infarction: Secondary | ICD-10-CM | POA: Diagnosis not present

## 2024-07-20 DIAGNOSIS — I639 Cerebral infarction, unspecified: Secondary | ICD-10-CM | POA: Diagnosis not present

## 2024-07-20 DIAGNOSIS — I482 Chronic atrial fibrillation, unspecified: Secondary | ICD-10-CM | POA: Diagnosis not present

## 2024-07-20 LAB — BASIC METABOLIC PANEL WITH GFR
Anion gap: 14 (ref 5–15)
BUN: 22 mg/dL (ref 8–23)
CO2: 24 mmol/L (ref 22–32)
Calcium: 8.9 mg/dL (ref 8.9–10.3)
Chloride: 105 mmol/L (ref 98–111)
Creatinine, Ser: 1.17 mg/dL (ref 0.61–1.24)
GFR, Estimated: >60 mL/min
Glucose, Bld: 106 mg/dL — ABNORMAL HIGH (ref 70–99)
Potassium: 3.9 mmol/L (ref 3.5–5.1)
Sodium: 142 mmol/L (ref 135–145)

## 2024-07-20 LAB — CBC
HCT: 32.3 % — ABNORMAL LOW (ref 39.0–52.0)
Hemoglobin: 10.7 g/dL — ABNORMAL LOW (ref 13.0–17.0)
MCH: 29.2 pg (ref 26.0–34.0)
MCHC: 33.1 g/dL (ref 30.0–36.0)
MCV: 88 fL (ref 80.0–100.0)
Platelets: 318 K/uL (ref 150–400)
RBC: 3.67 MIL/uL — ABNORMAL LOW (ref 4.22–5.81)
RDW: 14.1 % (ref 11.5–15.5)
WBC: 12.2 K/uL — ABNORMAL HIGH (ref 4.0–10.5)
nRBC: 0 % (ref 0.0–0.2)

## 2024-07-20 LAB — MAGNESIUM: Magnesium: 2.1 mg/dL (ref 1.7–2.4)

## 2024-07-20 LAB — PHOSPHORUS: Phosphorus: 2.4 mg/dL — ABNORMAL LOW (ref 2.5–4.6)

## 2024-07-20 LAB — GLUCOSE, CAPILLARY
Glucose-Capillary: 145 mg/dL — ABNORMAL HIGH (ref 70–99)
Glucose-Capillary: 152 mg/dL — ABNORMAL HIGH (ref 70–99)
Glucose-Capillary: 165 mg/dL — ABNORMAL HIGH (ref 70–99)

## 2024-07-20 MED ORDER — STERILE WATER FOR INJECTION IJ SOLN
INTRAMUSCULAR | Status: AC
  Filled 2024-07-20: qty 10

## 2024-07-20 MED ORDER — OSMOLITE 1.5 CAL PO LIQD
1000.0000 mL | ORAL | Status: DC
  Administered 2024-07-20 – 2024-07-28 (×9): 1000 mL
  Filled 2024-07-20 (×4): qty 1000

## 2024-07-20 MED ORDER — PROSOURCE TF20 ENFIT COMPATIBL EN LIQD
60.0000 mL | Freq: Every day | ENTERAL | Status: DC
  Administered 2024-07-20 – 2024-07-30 (×11): 60 mL
  Filled 2024-07-20 (×10): qty 60

## 2024-07-20 MED ORDER — THIAMINE MONONITRATE 100 MG PO TABS
100.0000 mg | ORAL_TABLET | Freq: Every day | ORAL | Status: AC
  Administered 2024-07-20 – 2024-07-26 (×7): 100 mg
  Filled 2024-07-20 (×7): qty 1

## 2024-07-20 MED ORDER — SODIUM CHLORIDE 0.9 % IV SOLN: INTRAVENOUS | Status: AC

## 2024-07-20 MED ORDER — FREE WATER
150.0000 mL | Freq: Four times a day (QID) | Status: DC
  Administered 2024-07-20 – 2024-07-30 (×36): 150 mL

## 2024-07-20 NOTE — Evaluation (Signed)
 Physical Therapy Evaluation Patient Details Name: Larry Briggs MRN: [id number] DOB: [birth year] Today's Date: 07/20/2024  History of Present Illness  Larry Briggs is a 88 y.o. male who presented 07/18/24 with worsening right sided weakness and inability to walk. Of note, pt recently seen in ED yesterday (12/16) where MRI of the brain and lumbar spine were negative and he was d/c'd Home. Repeat MRI showed small 9 mm posterior limb internal capsule acute infarct. PMHx: CAD, HTN, and HLD.   Clinical Impression  Pt admitted with above diagnosis. PTA, pt was independent to modI for functional mobility intermittently using a RW. His son reports 3 falls on 12/16 and various falls over the past 68mo. Pt lives with his wife in a one story house with 3 STE. Pt currently with functional limitations due to the deficits listed below (see PT Problem List). He required modA x2 for bed mobility, modA for seated balance d/t right lateral lean, modA x2 for sit<>stand, and maxA x2 for standing balance d/t R knee instability, forward flexion, unsteadiness, and fatigue. Pt is currently limited by impaired cognition, decreased communication, RHB weakness, impaired balance, and decreased activity tolerance. Pt will benefit from acute skilled PT to increase his independence and safety with mobility to allow discharge. Recommend intensive inpatient follow-up therapy, >3 hours/day.    If plan is discharge home, recommend the following: Two people to help with walking and/or transfers;A lot of help with bathing/dressing/bathroom;Assistance with cooking/housework;Assist for transportation;Help with stairs or ramp for entrance;Supervision due to cognitive status;Direct supervision/assist for medications management   Can travel by private vehicle        Equipment Recommendations Hospital bed;Hoyer lift;Wheelchair (measurements PT);Wheelchair cushion (measurements PT)  Recommendations for Other Services  Rehab consult     Functional Status Assessment Patient has had a recent decline in their functional status and demonstrates the ability to make significant improvements in function in a reasonable and predictable amount of time.     Precautions / Restrictions Precautions Precautions: Fall Recall of Precautions/Restrictions: Impaired Precaution/Restrictions Comments: Blood Pressure Goal: SBP less than 160 Restrictions Weight Bearing Restrictions Per Provider Order: No      Mobility  Bed Mobility Overal bed mobility: Needs Assistance Bed Mobility: Rolling, Sidelying to Sit, Sit to Sidelying Rolling: Mod assist, +2 for physical assistance, Used rails Sidelying to sit: Mod assist, +2 for physical assistance, HOB elevated, Used rails     Sit to sidelying: Mod assist, +2 for physical assistance, HOB elevated, Used rails General bed mobility comments: Pt sat up on R side of bed with increased time. Cued log roll technique. Placed BLE into knee flex and maintained them upright. Guided pt's LUE towards handrail. Use of bed rail to achieve sidelying. Brought BLE off EOB and elevated trunk. Returning to bed PT managed BLE and OT managed trunk. Repositioned using bed features, bed pad, and +2 assist.    Transfers Overall transfer level: Needs assistance Equipment used: 2 person hand held assist, Rolling walker (2 wheels) Transfers: Sit to/from Stand Sit to Stand: Mod assist, +2 physical assistance           General transfer comment: Pt stood from lowest bed height. Repositioned pt's BLE so they were squared on, increased knee flex so feet were tucked underneath her. Cued pt on proper hand placement. He stood with modA x2 to power up. Anterior knee block provided bilaterally. Observed R knee maintained in flexion as well as pt resting on the ball of his foot. He demonstrated a fwd  lean. VC/TC to correct posture increased to maxA x2 to maintain static stability. Attempted using RW to see if pt could push down  on AD to improve upright posture. Stood again with modA x2. Continued to demonstrated R knee flexion, unable to achieve extension. Guided pt down to return to sitting.    Ambulation/Gait               General Gait Details: Unable at this time.  Stairs            Wheelchair Mobility     Tilt Bed    Modified Rankin (Stroke Patients Only) Modified Rankin (Stroke Patients Only) Pre-Morbid Rankin Score: No symptoms Modified Rankin: Severe disability     Balance Overall balance assessment: Needs assistance Sitting-balance support: Bilateral upper extremity supported, Feet supported Sitting balance-Leahy Scale: Poor Sitting balance - Comments: Pt sat EOB with modA to maintain upright posture. He demonstrated a significant right lateral lean. Pt intermittently moving trunk going into posterior and anterior lean. He required constant assist for support. Pt progressed briefly to minA, but quickly required increased support. Postural control: Right lateral lean, Posterior lean Standing balance support: Bilateral upper extremity supported, During functional activity, Reliant on assistive device for balance Standing balance-Leahy Scale: Poor Standing balance comment: Pt dependent on +2 assist for therapists, mod-maxA statically.                             Pertinent Vitals/Pain Pain Assessment Pain Assessment: Faces Faces Pain Scale: Hurts little more Pain Location: R forearm/wrist and Back Pain Descriptors / Indicators: Grimacing Pain Intervention(s): Monitored during session, Limited activity within patient's tolerance, Repositioned    Home Living Family/patient expects to be discharged to:: Private residence Living Arrangements: Spouse/significant other Available Help at Discharge: Family;Available 24 hours/day;Neighbor;Available PRN/intermittently (Wife can supervise, but limited physical assist. Larry Briggs could come help out as needed. Son lives in [place]  and works 3rd shift.) Type of Home: House Home Access: Stairs to enter Entrance Stairs-Rails: Right Secretary/administrator of Steps: 3 (at the garage)   Home Layout: One level Home Equipment: Shower seat;Grab bars - tub/shower;Hand held shower head      Prior Function Prior Level of Function : Independent/Modified Independent             Mobility Comments: Per Son, ambulates using no AD vs. RW depending on the day. Pt didn't have any problem completing stairs. 3 falls on Tuesday. Son reports he will fall every once in a while, but unsure of the exact number. ADLs Comments: Son is unsure of pt's ADL status. Pt wears glasses for reading. Pt doesn't drive. Wife manages his medications.     Extremity/Trunk Assessment   Upper Extremity Assessment Upper Extremity Assessment: Defer to OT evaluation    Lower Extremity Assessment Lower Extremity Assessment: RLE deficits/detail;LLE deficits/detail;Difficult to assess due to impaired cognition RLE Deficits / Details: Decreased AROM. PROM WFL. Pt demonstrated slight hip flexion and limited knee extension while seated EOB. Grossly 3-/5 hip and knee. No active motion observed at ankle. RLE Sensation: decreased proprioception (unsure if this is secondary to decreased cognition.) RLE Coordination: decreased gross motor LLE Deficits / Details: Decreased AROM. PROM WFL. Pt demonstrated slight hip flexion and limited knee extension while seated EOB. Grossly 3-/5 hip and knee. No active motion observed at ankle. LLE Sensation: decreased proprioception (unsure if this is secondary to decreased cognition.) LLE Coordination: decreased gross motor    Cervical / Trunk  Assessment Cervical / Trunk Assessment: Kyphotic  Communication   Communication Communication: Impaired Factors Affecting Communication: Difficulty expressing self;Reduced clarity of speech    Cognition Arousal: Lethargic Behavior During Therapy: Impulsive   PT - Cognitive  impairments: Difficult to assess, Orientation, Awareness, Attention, Initiation, Sequencing, Problem solving, Safety/Judgement Difficult to assess due to: Impaired communication Orientation impairments: Place, Time, Situation                   PT - Cognition Comments: Pt drowsy, frequently closing eyes. Alertness improved with tactile stimulation and sitting up. He demonstrated large quick movement, frequently throwing his RUE into the bed rail and demonstrating significant multi-directional leans once sitting up. Pt was inconsistently able to follow simple one-step commands. He would move the opposite of his body that you were requesting. Mod-Max cues to safely mobilize. Following commands: Impaired Following commands impaired: Follows one step commands inconsistently, Follows one step commands with increased time     Cueing Cueing Techniques: Verbal cues, Gestural cues, Tactile cues     General Comments General comments (skin integrity, edema, etc.): BP supine 123/107 (114). HR 98-124bpm. SpO2 100% on RA. Pt with multiple dressings along his BUE and a couple on his BLE. Pt with dry blood on R forearm/wrist. Son present and supportive during session.    Exercises     Assessment/Plan    PT Assessment Patient needs continued PT services  PT Problem List Decreased strength;Decreased activity tolerance;Decreased balance;Decreased mobility;Decreased coordination;Decreased cognition;Decreased knowledge of use of DME;Decreased safety awareness;Decreased knowledge of precautions;Pain;Decreased skin integrity       PT Treatment Interventions DME instruction;Gait training;Stair training;Functional mobility training;Therapeutic activities;Therapeutic exercise;Balance training;Patient/family education;Wheelchair mobility training    PT Goals (Current goals can be found in the Care Plan section)  Acute Rehab PT Goals Patient Stated Goal: Son reports for pt to regain his mobility PT Goal  Formulation: With family Time For Goal Achievement: 08/03/24 Potential to Achieve Goals: Good    Frequency Min 3X/week     Co-evaluation PT/OT/SLP Co-Evaluation/Treatment: Yes Reason for Co-Treatment: Complexity of the patient's impairments (multi-system involvement);Necessary to address cognition/behavior during functional activity;For patient/therapist safety;To address functional/ADL transfers PT goals addressed during session: Mobility/safety with mobility;Balance         AM-PAC PT 6 Clicks Mobility  Outcome Measure Help needed turning from your back to your side while in a flat bed without using bedrails?: Total Help needed moving from lying on your back to sitting on the side of a flat bed without using bedrails?: Total Help needed moving to and from a bed to a chair (including a wheelchair)?: Total Help needed standing up from a chair using your arms (e.g., wheelchair or bedside chair)?: Total Help needed to walk in hospital room?: Total Help needed climbing 3-5 steps with a railing? : Total 6 Click Score: 6    End of Session Equipment Utilized During Treatment: Gait belt Activity Tolerance: Patient tolerated treatment well;Patient limited by fatigue Patient left: in bed;with call bell/phone within reach;with bed alarm set;with family/visitor present;with SCD's reapplied;Other (comment) (with mittens reapplied) Nurse Communication: Mobility status;Other (comment);Need for lift equipment (Need for dressing change on RUE. Recommendation for seizure pads to be placed on bed rails as pt keeps banging his arms on them. Maximove lift to transfer) PT Visit Diagnosis: Hemiplegia and hemiparesis;Other symptoms and signs involving the nervous system (R29.898);Other abnormalities of gait and mobility (R26.89);Unsteadiness on feet (R26.81) Hemiplegia - Right/Left: Right Hemiplegia - dominant/non-dominant: Dominant Hemiplegia - caused by: Cerebral infarction  Time: 9093-9062 PT  Time Calculation (min) (ACUTE ONLY): 31 min   Charges:   PT Evaluation $PT Eval Moderate Complexity: 1 Mod   PT General Charges $$ ACUTE PT VISIT: 1 Visit         Larry Briggs, PT, DPT Acute Rehabilitation Services Office: [phone] Secure Chat Preferred  Larry Briggs 07/20/2024, 11:31 AM

## 2024-07-20 NOTE — Evaluation (Signed)
 Occupational Therapy Evaluation Patient Details Name: Larry Briggs MRN: [id number] DOB: 1936-04-03 Today's Date: 07/20/2024   History of Present Illness   Larry Briggs is a 88 y.o. male who presented 07/18/24 with worsening right sided weakness and inability to walk. Of note, pt recently seen in ED yesterday (12/16) where MRI of the brain and lumbar spine were negative and he was d/c'd Home. Repeat MRI showed small 9 mm posterior limb internal capsule acute infarct. PMHx: CAD, HTN, and HLD.     Clinical Impressions Pt seen for OT evaluation this AM. Supportive son at bedside, assisting with PLOF info. Pt presenting disoriented, difficult to discern speech (2/2 poor dentition and dysarthria). Appearing restless and impulsive. Per son, PTA pt was mobile with RW vs cane, unsure level of independence with ADLs/IADLs. Wife assists with medications; pt does not drive. Today, he presents with grossly symmetrical strength in BUE (limited assessment due to cognition/impaired command following). Followed 1-step commands inconsistently, intermittent eyes open during assessment. Functionally, he currently requires max-total A for LB ADLs and up to mod A for UB ADLs. Responds well to hand-over-hand for task initiation/sequencing. HR fluctuating 90-120s with frequent PVCs, otherwise VSS.  Pt is currently functioning below baseline and would benefit from ongoing acute OT services to progress towards safe discharge and to facilitate return to prior level of function. Current recommendation is high-intensity post-acute rehab (> 3 hours/day).     If plan is discharge home, recommend the following:   Two people to help with walking and/or transfers;Two people to help with bathing/dressing/bathroom;Assistance with feeding;Direct supervision/assist for medications management;Direct supervision/assist for financial management;Assist for transportation;Help with stairs or ramp for entrance;Supervision due to  cognitive status     Functional Status Assessment   Patient has had a recent decline in their functional status and demonstrates the ability to make significant improvements in function in a reasonable and predictable amount of time.     Equipment Recommendations   Other (comment) (defer)     Recommendations for Other Services         Precautions/Restrictions   Precautions Precautions: Fall Recall of Precautions/Restrictions: Impaired Precaution/Restrictions Comments: SBP < 160 Restrictions Weight Bearing Restrictions Per Provider Order: No     Mobility Bed Mobility Overal bed mobility: Needs Assistance Bed Mobility: Rolling, Sidelying to Sit, Sit to Sidelying Rolling: Mod assist, +2 for physical assistance, Used rails Sidelying to sit: Mod assist, +2 for physical assistance, HOB elevated, Used rails     Sit to sidelying: Mod assist, +2 for physical assistance, HOB elevated, Used rails General bed mobility comments: exited to the R side, HOB slightly elevated, needs A for trunk elevation and BLE mgmt to come to sitting EOB    Transfers Overall transfer level: Needs assistance Equipment used: 2 person hand held assist, Rolling walker (2 wheels) Transfers: Sit to/from Stand Sit to Stand: Mod assist, +2 physical assistance           General transfer comment: stood twice from EOB once with and without RW. Improved form and upright achievement without AD d/t RLE buckling/flexion and kyphotic posture in stance. Needed up to max A +2 to maintain stance posture.      Balance Overall balance assessment: Needs assistance Sitting-balance support: Bilateral upper extremity supported, Single extremity supported, Feet supported Sitting balance-Leahy Scale: Poor Sitting balance - Comments: frequent multidirectional LOB, needs up to mod A to correct R lateral and posterior leans, posture improving after x1 stand at EOB Postural control: Posterior lean, Right lateral  lean Standing balance support: Bilateral upper extremity supported, During functional activity, Reliant on assistive device for balance Standing balance-Leahy Scale: Poor Standing balance comment: reliant on max A +2 in stance to maintain balance/stability in upright                           ADL either performed or assessed with clinical judgement   ADL Overall ADL's : Needs assistance/impaired Eating/Feeding: NPO   Grooming: Wash/dry face;Minimal assistance;Sitting Grooming Details (indicate cue type and reason): at EOB, hand over hand to initiate and sequence properly, demo'd proper use of B UE's despite mitt remaining on R hand         Upper Body Dressing : Moderate assistance   Lower Body Dressing: Total assistance Lower Body Dressing Details (indicate cue type and reason): poor initiaion/sequencing                     Vision Baseline Vision/History: 1 Wears glasses (readers; not present at time of OT eval) Patient Visual Report:  (UTA 2/2 cognition) Vision Assessment?: Vision impaired- to be further tested in functional context (con't to assess as able) Additional Comments: hx cataracts surgery     Perception Perception: Not tested       Praxis         Pertinent Vitals/Pain Pain Assessment Pain Assessment: Faces Faces Pain Scale: Hurts little more Pain Location: back upon returning to supine Pain Descriptors / Indicators: Grimacing Pain Intervention(s): Monitored during session, Repositioned     Extremity/Trunk Assessment Upper Extremity Assessment Upper Extremity Assessment: Difficult to assess due to impaired cognition (B elbow flexion/extension ~4/5,  limited L shoulder AROM, did not observe spontaneous R shoulder movement, although observed pt flailing BUE about during session)   Lower Extremity Assessment Lower Extremity Assessment: Defer to PT evaluation RLE Deficits / Details: Decreased AROM. PROM WFL. Pt demonstrated slight hip  flexion and limited knee extension while seated EOB. Grossly 3-/5 hip and knee. No active motion observed at ankle. RLE Sensation: decreased proprioception (unsure if this is secondary to decreased cognition.) RLE Coordination: decreased gross motor LLE Deficits / Details: Decreased AROM. PROM WFL. Pt demonstrated slight hip flexion and limited knee extension while seated EOB. Grossly 3-/5 hip and knee. No active motion observed at ankle. LLE Sensation: decreased proprioception (unsure if this is secondary to decreased cognition.) LLE Coordination: decreased gross motor   Cervical / Trunk Assessment Cervical / Trunk Assessment: Kyphotic   Communication Communication Communication: Impaired Factors Affecting Communication: Difficulty expressing self;Reduced clarity of speech   Cognition Arousal: Alert, Lethargic (fluctuating) Behavior During Therapy: Impulsive Cognition: Cognition impaired   Orientation impairments: Place, Time, Situation (responds to spoken name Bill; difficult to discern but was grossly able to state accurate DOB) Awareness: Intellectual awareness impaired Memory impairment (select all impairments): Short-term memory, Working memory Attention impairment (select first level of impairment): Focused attention Executive functioning impairment (select all impairments): Initiation, Organization, Sequencing, Reasoning, Problem solving OT - Cognition Comments: son unable to speak towards pt's baseline (son does not live locally), pt with inermittent sustained eyes open during session, difficult to assess cognition 2/2 poor command following and speech quality (lack of dentition), impulsive, responds well to hand-over-hand for initiation of tasks intermittently                 Following commands: Impaired Following commands impaired: Follows one step commands inconsistently, Follows one step commands with increased time     Cueing  General Comments   Cueing  Techniques: Verbal cues;Gestural cues;Tactile cues  BP supine 123/107 (114). HR 98-124bpm. Frequent PVC's throughout session.   Exercises     Shoulder Instructions      Home Living Family/patient expects to be discharged to:: Private residence Living Arrangements: Spouse/significant other Available Help at Discharge: Family;Available 24 hours/day;Neighbor;Available PRN/intermittently (Wife can supervise, but limited physical assist. Dovie could come help out as needed. Son lives in [place] and works 3rd shift.) Type of Home: House Home Access: Stairs to enter [employer] of Steps: 3 (at the garage entrance) Entrance Stairs-Rails: Right Home Layout: One level     Bathroom Shower/Tub: Producer, Television/film/video: Standard     Home Equipment: Shower seat;Grab bars - tub/shower;Hand held shower head      Lives With: Spouse    Prior Functioning/Environment Prior Level of Function : Independent/Modified Independent             Mobility Comments: Per Son, ambulates using no AD vs. RW depending on the day. Pt didn't have any problem completing stairs. 3 falls on Tuesday. Son reports he will fall every once in a while, but unsure of the exact number. ADLs Comments: Son is unsure of pt's ADL status. Pt wears glasses for reading. Pt doesn't drive. Wife has started managing his medications recently due to pt's memory and mixing up meds.    OT Problem List: Decreased strength;Decreased range of motion;Decreased activity tolerance;Impaired balance (sitting and/or standing);Decreased cognition;Decreased safety awareness;Impaired UE functional use   OT Treatment/Interventions: Self-care/ADL training;Therapeutic exercise;Energy conservation;DME and/or AE instruction;Therapeutic activities;Cognitive remediation/compensation;Patient/family education;Balance training      OT Goals(Current goals can be found in the care plan section)   Acute Rehab OT Goals Patient  Stated Goal: did not state OT Goal Formulation: With patient/family Time For Goal Achievement: 08/03/24 Potential to Achieve Goals: Good   OT Frequency:  Min 2X/week    Co-evaluation PT/OT/SLP Co-Evaluation/Treatment: Yes Reason for Co-Treatment: Complexity of the patient's impairments (multi-system involvement);Necessary to address cognition/behavior during functional activity;For patient/therapist safety;To address functional/ADL transfers PT goals addressed during session: Mobility/safety with mobility;Balance OT goals addressed during session: ADL's and self-care;Other (comment) (cognition)      AM-PAC OT 6 Clicks Daily Activity     Outcome Measure Help from another person eating meals?: A Lot Help from another person taking care of personal grooming?: A Lot Help from another person toileting, which includes using toliet, bedpan, or urinal?: Total Help from another person bathing (including washing, rinsing, drying)?: A Lot Help from another person to put on and taking off regular upper body clothing?: A Lot Help from another person to put on and taking off regular lower body clothing?: Total 6 Click Score: 10   End of Session Equipment Utilized During Treatment: Gait belt;Rolling walker (2 wheels) Nurse Communication: Mobility status;Other (comment) (pt status, bleed from R wrist/forearm)  Activity Tolerance: Patient tolerated treatment well Patient left: in bed;with call bell/phone within reach;with bed alarm set;with restraints reapplied;with family/visitor present  OT Visit Diagnosis: Unsteadiness on feet (R26.81);Repeated falls (R29.6);Muscle weakness (generalized) (M62.81);Cognitive communication deficit (R41.841) Symptoms and signs involving cognitive functions: Cerebral infarction                Time: 9093-9062 OT Time Calculation (min): 31 min Charges:  OT General Charges $OT Visit: 1 Visit OT Evaluation $OT Eval Moderate Complexity: 1 Mod  Usama Harkless M. [place],  OTR/L [facility] Acute Rehabilitation Services [phone] Secure Chat Preferred  Malakhi Markwood 07/20/2024, 12:04 PM

## 2024-07-20 NOTE — Care Management Important Message (Signed)
 Important Message  Patient Details  Name: RYSHAWN SANZONE MRN: [id number] Date of Birth: [birth year]   Important Message Given:  Yes - Medicare IM     Claretta Deed 07/20/2024, 1:52 PM

## 2024-07-20 NOTE — Progress Notes (Signed)
" ° °  Inpatient Rehab Admissions Coordinator :  Per therapy recommendations patient was screened for CIR candidacy by Heron Leavell RN MSN. Patient is not yet at a level to tolerate the intensity required to pursue a CIR admit. Noted ongoing goals of care discussions. Would like to see how he does over the next 24 to 48 hrs before assisting with dispo. The CIR admissions team will follow and monitor for progress and place a Rehab Consult order if felt to be appropriate. Please contact me with any questions.  Heron Leavell RN MSN Admissions Coordinator [phone]  "

## 2024-07-20 NOTE — Evaluation (Signed)
 Speech Language Pathology Evaluation Patient Details Name: Larry Briggs MRN: [id number] DOB: Dec 13, 1935 Today's Date: 07/20/2024 Time: 8996-8975 SLP Time Calculation (min) (ACUTE ONLY): 21 min  Problem List:  Patient Active Problem List   Diagnosis Date Noted   Right sided weakness 07/19/2024   CVA (cerebral vascular accident) (HCC) 12/05/2022   Pseudomonas aeruginosa infection 11/20/2019   Abrasion 07/30/2019   Medication management 07/03/2019   Abnormal findings on diagnostic imaging of lung 03/02/2019   New onset atrial fibrillation (HCC)    Hypokalemia    Acute blood loss anemia    Hip fracture (HCC) 09/04/2017   Cough 04/22/2016   COPD GOLD 4  04/22/2016   T wave inversion in EKG 05/10/2011   Hypertension    Arteriosclerotic cardiovascular disease (ASCVD)    Hypercholesteremia    Obesity    Ischemic heart disease    Increased glucose level    Past Medical History:  Past Medical History:  Diagnosis Date   CAD (coronary artery disease)    Remote MI in 1980. CABG 1995. Stent to left main in 2004. Last cath in April of 2012. EF 50%; patent LIMA to DX/LAD with collateralization of the distal right, patent SVG to OM, occluded SVG to distal RCA, and patent stent to LAD   History of heart attack 1980   Hypercholesteremia    Hypertension    Increased glucose level    Obesity    Past Surgical History:  Past Surgical History:  Procedure Laterality Date   CARDIAC CATHETERIZATION  1993   CARDIAC CATHETERIZATION  April 2012   Mild reduction if EF at 50%. Patent LIMA to DX/LAD with collateralization to distal RCA, patent SVG to OM and occluded SVG to distal right and patent stent to LAD/Left main;   CORONARY ARTERY BYPASS GRAFT  05/1994   x5 LIMA to LAD & DX, SVG to LCX, SVG to RCA   CORONARY STENT PLACEMENT  2004   Stent to the L main   INTRAMEDULLARY (IM) NAIL INTERTROCHANTERIC Left 09/05/2017   Procedure: INTRAMEDULLARY (IM) NAIL INTERTROCHANTRIC;  Surgeon: Beverley Evalene BIRCH, MD;  Location: [facility] OR;  Service: Orthopedics;  Laterality: Left;   HPI:  Larry Briggs is a 88 y.o. male from home with weakness on the right side with difficulty ambulating. MRI  Acute/subacute nonhemorrhagic 9 mm infarct in the posterior limb of the left internal capsule with associated T2 and FLAIR hyperintensities. PMH: CAD, hypertension, hyperlipidemia   Assessment / Plan / Recommendation Clinical Impression  Wife reports pt has memory deficits prior to admission and that she is responsible for all finances, medications, appointments. He can perform some ADL's with assist and typically watches tv at home.She states family has discussed having pt assessed for dementia but feels pt would be reluctant. Wife states his cognition appears to be at baseline. He exhibits significant dysarthria - attempted to place dentures however not fitting (have ordered cream). Most responses were unintelligible. Was able to decipher 1-3 words but language appeared to be intact. He   comprehended one step commands and basic questions. Cognitive impairments evident that are overall baseline. SLP will see pt for therapy targeting his dysarthria.    SLP Assessment  SLP Recommendation/Assessment: Patient needs continued Speech Language Pathology Services SLP Visit Diagnosis: Dysarthria and anarthria (R47.1);Cognitive communication deficit (R41.841)     Assistance Recommended at Discharge  Frequent or constant Supervision/Assistance  Functional Status Assessment Patient has had a recent decline in their functional status and demonstrates the ability to  make significant improvements in function in a reasonable and predictable amount of time.  Frequency and Duration min 1 x/week  1 week      SLP Evaluation Cognition  Overall Cognitive Status: History of cognitive impairments - at baseline Arousal/Alertness:  (drowsy) Orientation Level: Oriented to person;Disoriented to place;Disoriented to situation  (stated [place] then [employer]) Attention: Sustained Sustained Attention: Impaired Memory:  (not formally assessed- deficts and wife states at baseline) Awareness: Impaired Awareness Impairment: Intellectual impairment Problem Solving:  (responses unintelligible) Safety/Judgment: Impaired       Comprehension  Auditory Comprehension Overall Auditory Comprehension: Appears within functional limits for tasks assessed (followed one step commands) Visual Recognition/Discrimination Discrimination: Not tested Reading Comprehension Reading Status: Not tested    Expression Expression Primary Mode of Expression: Verbal Verbal Expression Overall Verbal Expression: Appears within functional limits for tasks assessed Initiation: No impairment Level of Generative/Spontaneous Verbalization: Sentence Repetition:  (NT) Naming: Impairment Divergent:  (named 5 items in one minute) Pragmatics: Impairment Impairments: Eye contact Written Expression Written Expression: Not tested   Oral / Motor  Oral Motor/Sensory Function Overall Oral Motor/Sensory Function:  (appeared slightly weaker on left side however stroke affected right side) Motor Speech Overall Motor Speech: Impaired Respiration: Within functional limits Phonation: Normal Resonance: Within functional limits Articulation: Impaired Level of Impairment: Word Intelligibility: Intelligibility reduced Word: 50-74% accurate Phrase: 25-49% accurate Sentence: 0-24% accurate Motor Planning: Within functional limits Motor Speech Errors: Not applicable            Dustin Olam Bull 07/20/2024, 10:52 AM

## 2024-07-20 NOTE — TOC Initial Note (Signed)
 Transition of Care [facility]) - Initial/Assessment Note    Patient Details  Name: Larry Briggs MRN: [id number] Date of Birth: 12/09/1935  [facility]) CM/SW Contact:    Andrez JULIANNA George, RN Phone Number: 07/20/2024, 3:17 PM  Clinical Narrative:                 Larry Briggs is a 88 y.o. male with medical history significant of CAD, hypertension, hyperlipidemia who was in his normal state of health and then yesterday developed weakness on the right side and was unable to walk.  Pt is from home with his spouse. Spouse assists him with ADL's.  Spouse provides needed transportation and fill his pill box.  Per wife pt had 2 falls the week of Thanksgiving.   Current recommendations are for CIR. CIR unsure if able to tolerate but will follow.  IP Care management following.  Expected Discharge Plan: IP Rehab Facility Barriers to Discharge: Continued Medical Work up   Patient Goals and CMS Choice   CMS Medicare.gov Compare Post Acute Care list provided to:: Patient Represenative (must comment) Choice offered to / list presented to : Spouse, Patient, Adult Children      Expected Discharge Plan and Services   Discharge Planning Services: CM Consult Post Acute Care Choice: IP Rehab Living arrangements for the past 2 months: Single Family Home                                      Prior Living Arrangements/Services Living arrangements for the past 2 months: Single Family Home Lives with:: Spouse Patient language and need for interpreter reviewed:: Yes Do you feel safe going back to the place where you live?: Yes        Care giver support system in place?: Yes (comment) Current home services: DME (cane/ walker/ wheelchair/ shower seat/ bars) Criminal Activity/Legal Involvement Pertinent to Current Situation/Hospitalization: No - Comment as needed  Activities of Daily Living      Permission Sought/Granted                  Emotional  Assessment Appearance:: Appears stated age         Psych Involvement: No (comment)  Admission diagnosis:  CVA (cerebral vascular accident) (HCC) [I63.9] Right sided weakness [R53.1] Patient Active Problem List   Diagnosis Date Noted   Right sided weakness 07/19/2024   CVA (cerebral vascular accident) (HCC) 12/05/2022   Pseudomonas aeruginosa infection 11/20/2019   Abrasion 07/30/2019   Medication management 07/03/2019   Abnormal findings on diagnostic imaging of lung 03/02/2019   New onset atrial fibrillation (HCC)    Hypokalemia    Acute blood loss anemia    Hip fracture (HCC) 09/04/2017   Cough 04/22/2016   COPD GOLD 4  04/22/2016   T wave inversion in EKG 05/10/2011   Hypertension    Arteriosclerotic cardiovascular disease (ASCVD)    Hypercholesteremia    Obesity    Ischemic heart disease    Increased glucose level    PCP:  [name], MD Pharmacy:   [facility] [address] [place] - [address] [place] [address] Phone: [phone] Fax: [phone]  [facility] [place], [place] - [address] [place] [address] Phone: [phone] Fax: [phone]     Social Drivers of Health (SDOH) Social History: SDOH Screenings  Food Insecurity: No Food Insecurity (08/06/2021)  Housing: Low Risk (08/06/2021)  Transportation Needs: No Transportation Needs (08/06/2021)  Tobacco Use: Medium Risk (07/18/2024)   SDOH Interventions:     Readmission Risk Interventions     No data to display

## 2024-07-20 NOTE — Plan of Care (Signed)
" °  Problem: Clinical Measurements: Goal: Ability to maintain clinical measurements within normal limits will improve Outcome: Progressing Goal: Will remain free from infection Outcome: Progressing   Problem: Pain Managment: Goal: General experience of comfort will improve and/or be controlled Outcome: Progressing   Problem: Safety: Goal: Ability to remain free from injury will improve Outcome: Progressing   Problem: Nutrition: Goal: Risk of aspiration will decrease Outcome: Not Progressing   Problem: Activity: Goal: Risk for activity intolerance will decrease Outcome: Not Progressing   Problem: Nutrition: Goal: Adequate nutrition will be maintained Outcome: Not Progressing   Problem: Elimination: Goal: Will not experience complications related to urinary retention Outcome: Not Progressing   Problem: Skin Integrity: Goal: Risk for impaired skin integrity will decrease Outcome: Not Progressing   "

## 2024-07-20 NOTE — Progress Notes (Addendum)
 " PROGRESS NOTE  Larry Briggs FMW:[id number] DOB: [birth year] DOA: 07/18/2024 PCP: [name], MD   LOS: 2 days   Brief narrative:  Larry Briggs is a 88 y.o. male with past medical history significant of CAD, hypertension, hyperlipidemia presented to the hospital with weakness on the right side with difficulty ambulating.  Patient was brought into the ED patient and had MRI of the brain and lumbar spine which was negative.  Patient was then discharged and returned to ER due to persistent right-sided weakness.  In the ED, patient was afebrile and hemodynamically stable.  Labs were notable for WBC 8.2, hemoglobin 11.4, troponin 57, 43, urinalysis negative for infection.  Patient was then admitted to the hospital for further evaluation and treatment.  After admission patient also had confusion and agitation.  Neurology has been consulted.  Assessment/Plan: Principal Problem:   CVA (cerebral vascular accident) [facility]) Active Problems:   Right sided weakness  Acute CVA Neurology on board.  Underwent repeat MRI of the brain on 07/19/2024 with acute/subacute nonhemorrhagic 9 mm infarct in the posterior limb of the left internal capsule.  Pending PT, OT speech evaluation.  2D echocardiogram showed LV ejection fraction of 60 to 65% with LVH.  Carotid duplex ultrasound showed right and left ICA with 40 to 59% stenosis.  Currently on aspirin  and Xarelto .  Patient is still n.p.o. pending speech evaluation.  Continue with IV fluids.  Continue aspiration and fall precautions.  Agitation confusion.  Improved at this time.  Patient is much more alert awake and communicative today.  Still with episodes of somnolence.  Family at bedside.    Essential hypertension-continue amlodipine    CAD-continue aspirin , resume Xarelto .  Irregular rhythm.  EKG reviewed.  Looks possibility of A-fib with PVCs,.  Telemetry monitor with some PVCs and irregular rhythm but already on Xarelto .  Rate controlled at this  time.  Goals of care  Spoke with the patient's spouse at bedside regarding CODE STATUS.  Neurology Dr. Rosemarie at bedside as well.  At this time, patient's spouse e does not wish him to be on life support or ventilator if things were to go downhill.  She is however hopeful that he will improve.  She is also okay with feeding tube if needed.  Discussed about potential deterioration in patient's condition and she is aware of that..  Addendum:  07/20/2024 12:42 PM   Family reached out again to the team and wished full code for now.  DVT prophylaxis: SCD's Start: 07/18/24 2339 SCDs Start: 07/18/24 1816 Rivaroxaban  (XARELTO ) tablet 15 mg   Disposition: Will likely need need rehabilitation.  Pending PT OT evaluation.  Status is: Inpatient Remains inpatient appropriate because: Pending clinical improvement, possible need for placement    Code Status:     Code Status: Limited: Do not attempt resuscitation (DNR) -DNR-LIMITED -Do Not Intubate/DNI   Family Communication: Spoke with the patient's spouse at bedside on 07/20/2024  Consultants: Neurology  Procedures: None  Anti-infectives:  None  Anti-infectives (From admission, onward)    None        Subjective: Today, patient was seen and examined at bedside.  Patient appears to be more alert awake but still closing his eyes.  Family at bedside and responding to exam and voicing as well.  Denies any shortness of breath chest pain nausea vomiting  Objective: Vitals:   07/20/24 0909 07/20/24 1117  BP: (!) 122/59 134/89  Pulse: (!) 101 (!) 102  Resp: (!) 33 (!) 23  Temp: 99.1  F (37.3 C) 99.6 F (37.6 C)  SpO2: 98% 97%    Intake/Output Summary (Last 24 hours) at 07/20/2024 1120 Last data filed at 07/20/2024 0157 Gross per 24 hour  Intake 429.72 ml  Output 600 ml  Net -170.28 ml   Filed Weights   07/18/24 0534  Weight: 74.8 kg   Body mass index is 23.66 kg/m.   Physical Exam: GENERAL: Patient is more alert awake  and Communicative, intermittently somnolent.  Frail-appearing elderly male. HENT: No scleral pallor or icterus. Pupils equally reactive to light. Oral mucosa is dry NECK: is supple, no gross swelling noted. CHEST: Diminished breath sounds bilaterally.  Sternal scar noted CVS: S1 and S2 heard, no murmur. Regular rate and rhythm.  ABDOMEN: Soft, non-tender, bowel sounds are present. EXTREMITIES: No edema. CNS: Dementedly Sleepy but alert awake and Communicative, right lower extremity weakness SKIN: warm and dry without rashes.  Decreased skin turgor.  Data Review: I have personally reviewed the following laboratory data and studies,  CBC: Recent Labs  Lab 07/17/24 1610 07/18/24 1402 07/20/24 0600  WBC 8.2 11.4* 12.2*  NEUTROABS  --  10.2*  --   HGB 11.4* 12.3* 10.7*  HCT 35.6* 38.2* 32.3*  MCV 88.1 91.0 88.0  PLT 325 327 318   Basic Metabolic Panel: Recent Labs  Lab 07/17/24 1610 07/18/24 1402 07/20/24 0600  NA 137 139 142  K 3.8 4.1 3.9  CL 102 102 105  CO2 26 24 24   GLUCOSE 113* 126* 106*  BUN 20 25* 22  CREATININE 1.24 1.36* 1.17  CALCIUM  9.0 9.5 8.9  MG  --   --  2.1   Liver Function Tests: Recent Labs  Lab 07/17/24 1610 07/18/24 1402  AST 18 20  ALT 7 8  ALKPHOS 81 90  BILITOT 0.6 1.0  PROT 6.0* 6.3*  ALBUMIN  3.9 4.1   No results for input(s): LIPASE, AMYLASE in the last 168 hours. No results for input(s): AMMONIA in the last 168 hours. Cardiac Enzymes: No results for input(s): CKTOTAL, CKMB, CKMBINDEX, TROPONINI in the last 168 hours. BNP (last 3 results) No results for input(s): BNP in the last 8760 hours.  ProBNP (last 3 results) No results for input(s): PROBNP in the last 8760 hours.  CBG: No results for input(s): GLUCAP in the last 168 hours. No results found for this or any previous visit (from the past 240 hours).   Studies: VAS US  CAROTID Result Date: 07/20/2024 Carotid Arterial Duplex Study Patient Name:  Larry Briggs  [facility]  Date of Exam:   07/19/2024 Medical Rec #: [id number]         Accession #:    [id number] Date of Birth: 03-03-1936        Patient Gender: M Patient Age:   89 years Exam Location:  [facility] Procedure:      VAS US  CAROTID Referring Phys: VERNAL Deandrew Hoecker --------------------------------------------------------------------------------  Indications:       CVA. Risk Factors:      Hyperlipidemia, past history of smoking, prior MI, coronary                    artery disease. Other Factors:     Afib, COPD,. Limitations        VERY limited exam due to patient with altered mental status                    (combative, vocalizing, hitting tech, poor ultrasound tissue  interface.) Comparison Study:  No previous exams Performing Technologist: Jody Hill RVT, RDMS  Examination Guidelines: A complete evaluation includes B-mode imaging, spectral Doppler, color Doppler, and power Doppler as needed of all accessible portions of each vessel. Bilateral testing is considered an integral part of a complete examination. Limited examinations for reoccurring indications may be performed as noted.  Right Carotid Findings: +----------+--------+--------+--------+---------------------+------------------+           PSV cm/sEDV cm/sStenosisPlaque Description   Comments           +----------+--------+--------+--------+---------------------+------------------+ CCA Prox  79      10              heterogenous                            +----------+--------+--------+--------+---------------------+------------------+ CCA Distal86      10              heterogenous and                                                          calcific                                +----------+--------+--------+--------+---------------------+------------------+ ICA Prox  264     35      40-59%  calcific             not well                                                                  visualized          +----------+--------+--------+--------+---------------------+------------------+ ICA Mid                                                unable to                                                                 visualize          +----------+--------+--------+--------+---------------------+------------------+ ICA Distal                                             unable to  visualize          +----------+--------+--------+--------+---------------------+------------------+ ECA       213     17                                                      +----------+--------+--------+--------+---------------------+------------------+ +----------+--------+-------+----------------+-------------------+           PSV cm/sEDV cmsDescribe        Arm Pressure (mmHG) +----------+--------+-------+----------------+-------------------+ [id number]            Multiphasic, WNL                    +----------+--------+-------+----------------+-------------------+ +---------+--------+--+--------+--+---------+ VertebralPSV cm/s62EDV cm/s12Antegrade +---------+--------+--+--------+--+---------+  Left Carotid Findings: +----------+--------+--------+--------+---------------------+------------------+           PSV cm/sEDV cm/sStenosisPlaque Description   Comments           +----------+--------+--------+--------+---------------------+------------------+ CCA Prox  101     17              homogeneous and                                                           calcific                                +----------+--------+--------+--------+---------------------+------------------+ CCA Distal113     17                                                      +----------+--------+--------+--------+---------------------+------------------+ ICA Prox  216     42      40-59%  calcific             not well                                                                   visualized         +----------+--------+--------+--------+---------------------+------------------+ ICA Mid                                                unable to                                                                 visualize          +----------+--------+--------+--------+---------------------+------------------+ ICA Distal  unable to                                                                 visualize          +----------+--------+--------+--------+---------------------+------------------+ ECA       95      11                                                      +----------+--------+--------+--------+---------------------+------------------+ +----------+--------+--------+----------------+-------------------+           PSV cm/sEDV cm/sDescribe        Arm Pressure (mmHG) +----------+--------+--------+----------------+-------------------+ [id number]             Multiphasic, WNL                    +----------+--------+--------+----------------+-------------------+ +---------+--------+--+--------+--+---------+ VertebralPSV cm/s43EDV cm/s12Antegrade +---------+--------+--+--------+--+---------+   Summary: Right Carotid: Velocities in the right ICA are consistent with a 40-59%                stenosis. The ECA appears >50% stenosed. Left Carotid: Velocities in the left ICA are consistent with a 40-59% stenosis.  *See table(s) above for measurements and observations.  Electronically signed by Eather Popp MD on 07/20/2024 at 10:52:26 AM.    Final    MR ANGIO HEAD WO CONTRAST Result Date: 07/19/2024 EXAM: MR Angiography Head without intravenous Contrast. 07/19/2024 01:11:05 PM TECHNIQUE: Magnetic resonance angiography images of the head without intravenous contrast. Multiplanar 2D and 3D reformatted images are provided for review.  COMPARISON: MRA head Dec 05, 2022 CLINICAL HISTORY: Stroke/TIA, determine embolic source FINDINGS: Moderately motion limited study.  Within this limitation: ANTERIOR CIRCULATION: No significant stenosis of the internal carotid arteries. No significant stenosis of the anterior cerebral arteries. No significant stenosis of the middle cerebral arteries. No aneurysm. POSTERIOR CIRCULATION: No significant stenosis of the posterior cerebral arteries. No significant stenosis of the basilar artery. No significant stenosis of the vertebral arteries. No aneurysm. IMPRESSION: 1. Moderately motion limited study without large vessel occlusion or high grade stenosis. Electronically signed by: Gilmore Molt 07/19/2024 05:47 PM EST RP Workstation: [id number]   ECHOCARDIOGRAM COMPLETE Result Date: 07/19/2024    ECHOCARDIOGRAM REPORT   Patient Name:   Larry Briggs Date of Exam: 07/19/2024 Medical Rec #:  [id number]        Height:       70.0 in Accession #:    [id number]       Weight:       164.9 lb Date of Birth:  November 28, 1935       BSA:          1.923 m Patient Age:    88 years         BP:           206/183 mmHg Patient Gender: M                HR:           60 bpm. Exam Location:  Inpatient Procedure: 2D Echo, Cardiac Doppler and Color Doppler (Both Spectral and Color  Flow Doppler were utilized during procedure). Indications:    Stroke I63.9  History:        Patient has prior history of Echocardiogram examinations, most                 recent 09/07/2017. Stroke; Signs/Symptoms:Hypertensive Heart                 Disease.  Sonographer:    Sydnee Wilson Referring Phys: LAMAR DESS IMPRESSIONS  1. Left ventricular ejection fraction, by estimation, is 60 to 65%. The left ventricle has normal function. The left ventricle has no regional wall motion abnormalities. There is mild left ventricular hypertrophy. Left ventricular diastolic parameters are indeterminate.  2. Right ventricular systolic function is normal. The  right ventricular size is normal.  3. A small pericardial effusion is present. There is no evidence of cardiac tamponade.  4. The mitral valve is normal in structure. No evidence of mitral valve regurgitation. No evidence of mitral stenosis.  5. The aortic valve is tricuspid. Aortic valve regurgitation is not visualized. No aortic stenosis is present. Aortic valve area, by VTI measures 2.61 cm. Aortic valve mean gradient measures 3.5 mmHg. Aortic valve Vmax measures 1.02 m/s.  6. The inferior vena cava is normal in size with greater than 50% respiratory variability, suggesting right atrial pressure of 3 mmHg. FINDINGS  Left Ventricle: Left ventricular ejection fraction, by estimation, is 60 to 65%. The left ventricle has normal function. The left ventricle has no regional wall motion abnormalities. The left ventricular internal cavity size was normal in size. There is  mild left ventricular hypertrophy. Left ventricular diastolic parameters are indeterminate. Normal left ventricular filling pressure. Right Ventricle: The right ventricular size is normal. No increase in right ventricular wall thickness. Right ventricular systolic function is normal. Left Atrium: Left atrial size was normal in size. Right Atrium: Right atrial size was normal in size. Pericardium: A small pericardial effusion is present. There is diastolic collapse of the right ventricular free wall. There is no evidence of cardiac tamponade. Mitral Valve: The mitral valve is normal in structure. No evidence of mitral valve regurgitation. No evidence of mitral valve stenosis. Tricuspid Valve: The tricuspid valve is normal in structure. Tricuspid valve regurgitation is not demonstrated. No evidence of tricuspid stenosis. Aortic Valve: The aortic valve is tricuspid. Aortic valve regurgitation is not visualized. No aortic stenosis is present. Aortic valve mean gradient measures 3.5 mmHg. Aortic valve peak gradient measures 4.2 mmHg. Aortic valve area, by  VTI measures 2.61 cm. Pulmonic Valve: The pulmonic valve was normal in structure. Pulmonic valve regurgitation is not visualized. No evidence of pulmonic stenosis. Aorta: The aortic root is normal in size and structure. Venous: The inferior vena cava is normal in size with greater than 50% respiratory variability, suggesting right atrial pressure of 3 mmHg. IAS/Shunts: No atrial level shunt detected by color flow Doppler.  LEFT VENTRICLE PLAX 2D LVIDd:         3.80 cm     Diastology LVIDs:         2.70 cm     LV e' medial:    10.40 cm/s LV PW:         1.30 cm     LV E/e' medial:  8.4 LV IVS:        1.00 cm     LV e' lateral:   12.90 cm/s LVOT diam:     2.00 cm     LV E/e' lateral: 6.8 LV SV:  56 LV SV Index:   29 LVOT Area:     3.14 cm  LV Volumes (MOD) LV vol d, MOD A2C: 72.8 ml LV vol d, MOD A4C: 71.7 ml LV vol s, MOD A2C: 25.2 ml LV vol s, MOD A4C: 28.9 ml LV SV MOD A2C:     47.6 ml LV SV MOD A4C:     71.7 ml LV SV MOD BP:      45.7 ml RIGHT VENTRICLE RV S prime:     11.70 cm/s TAPSE (M-mode): 1.6 cm LEFT ATRIUM             Index        RIGHT ATRIUM           Index LA diam:        3.40 cm 1.77 cm/m   RA Area:     13.90 cm LA Vol (A2C):   21.9 ml 11.39 ml/m  RA Volume:   33.10 ml  17.21 ml/m LA Vol (A4C):   20.3 ml 10.56 ml/m LA Biplane Vol: 21.6 ml 11.23 ml/m  AORTIC VALVE AV Area (Vmax):    3.55 cm AV Area (Vmean):   2.48 cm AV Area (VTI):     2.61 cm AV Vmax:           101.89 cm/s AV Vmean:          87.550 cm/s AV VTI:            0.216 m AV Peak Grad:      4.2 mmHg AV Mean Grad:      3.5 mmHg LVOT Vmax:         115.00 cm/s LVOT Vmean:        69.200 cm/s LVOT VTI:          0.179 m LVOT/AV VTI ratio: 0.83  AORTA Ao Root diam: 3.30 cm Ao Asc diam:  4.20 cm MITRAL VALVE MV Area (PHT): 3.95 cm    SHUNTS MV Decel Time: 192 msec    Systemic VTI:  0.18 m MV E velocity: 87.50 cm/s  Systemic Diam: 2.00 cm MV A velocity: 89.60 cm/s MV E/A ratio:  0.98 Annabella Scarce MD Electronically signed by Annabella Scarce MD Signature Date/Time: 07/19/2024/2:39:34 PM    Final    MR BRAIN WO CONTRAST Result Date: 07/18/2024 EXAM: MRI BRAIN WITHOUT CONTRAST 07/18/2024 07:35:00 PM TECHNIQUE: Multiplanar multisequence MRI of the head/brain was performed without the administration of intravenous contrast. COMPARISON: MR head without contrast 07/17/2024. CLINICAL HISTORY: Stroke, follow up. FINDINGS: BRAIN AND VENTRICLES: An acute nonhemorrhagic 9 mm infarct is present within the posterior limb of the left internal capsule. T2 and FLAIR hyperintensities are associated with this infarct. Atrophy and white matter changes are otherwise stable. White matter changes extend into the brainstem. No intracranial hemorrhage. No mass. No midline shift. No hydrocephalus. The sella is unremarkable. Normal flow voids. ORBITS: Bilateral lens replacements are noted. The globes and orbits are otherwise within normal limits. SINUSES AND MASTOIDS: No acute abnormality. BONES AND SOFT TISSUES: Normal marrow signal. No acute soft tissue abnormality. IMPRESSION: 1. Acute/subacute nonhemorrhagic 9 mm infarct in the posterior limb of the left internal capsule with associated T2 and FLAIR hyperintensities. 2. Stable atrophy and white matter changes, including extension into the brainstem. Electronically signed by: Lonni Necessary MD 07/18/2024 08:00 PM EST RP Workstation: [id number]   MR THORACIC SPINE WO CONTRAST Result Date: 07/18/2024 EXAM: MR Thoracic Spine without 07/18/2024 04:25:32 PM TECHNIQUE: Multiplanar multisequence MRI of the  thoracic spine was performed without the administration of intravenous contrast. COMPARISON: None available CLINICAL HISTORY: Back trauma, abnormal neuro exam, CT or xray positive (Age >= 16y). FINDINGS: BONES AND ALIGNMENT: Exaggerated kyphosis is present in the upper thoracic spine. Remote superior endplate compression fracture was present at T1 with less than 20% loss of height. A remote superior endplate  fracture present at T5 with approximately 20% loss of height. A remote superior endplate fracture is present at L1 with 40% loss of height. No retropulsed bone is present. Marrow signal is unremarkable. SPINAL CORD: Normal spinal cord volume. Normal spinal cord signal. SOFT TISSUES: Unremarkable. DEGENERATIVE CHANGES: Shallow central disc protrusions are present at T3-T4, T4-T5 and T6-T7 without significant stenosis. Facet hypertrophy and asymmetric right sided facet hypertrophy contributes to moderate right foraminal stenosis at T9-T10. Minimal disc bulging is present at T11-T12 without significant stenosis. . IMPRESSION: 1. Remote superior endplate compression fractures at T1 (<20% height loss), T5 (~20% height loss), and L1 (40% height loss), without retropulsed bone. 2. Moderate right foraminal stenosis at T9-T10 due to facet hypertrophy and asymmetric right-sided facet hypertrophy. 3. No other focal stenosis or cord changes to explain right lower extremity weakness. Electronically signed by: Lonni Necessary MD 07/18/2024 05:04 PM EST RP Workstation: [id number]   MR Cervical Spine Wo Contrast Result Date: 07/18/2024 EXAM: MRI CERVICAL SPINE WITHOUT CONTRAST 07/18/2024 04:25:11 PM TECHNIQUE: Multiplanar multisequence MRI of the cervical spine was performed. COMPARISON: None available. CLINICAL HISTORY: Right sided weakness. FINDINGS: BONES AND ALIGNMENT: Degenerative anterolisthesis is present at C5-C6. Remote superior endplate T1 fracture is present. Normal vertebral body heights. Bone marrow signal is unremarkable. SPINAL CORD: Normal spinal cord size. No abnormal spinal cord signal. SOFT TISSUES: No paraspinal mass. C2-C3: Moderate facet hypertrophy is present bilaterally without focal stenosis. No significant disc herniation. No spinal canal stenosis or neural foraminal narrowing. C3-C4: Mild disc bulging is present. Moderate facet hypertrophy is worse on the left. Moderate left foraminal stenosis is  present. No spinal canal stenosis. C4-C5: A broad-based disc osteophyte complex partially effaces the ventral CSF. Moderate facet hypertrophy is present bilaterally. Mild bilateral foraminal narrowing is present. No spinal canal stenosis. C5-C6: Degenerative anterolisthesis is present. Mild disc bulging is present without focal stenosis. No spinal canal stenosis or neural foraminal narrowing. C6-C7: A broad-based disc osteophyte complex is present. This effaces the ventral CSF. The foramina are patent bilaterally. No spinal canal stenosis. C7-T1: Remote superior endplate T1 fracture is present. No significant disc herniation. No spinal canal stenosis or neural foraminal narrowing. IMPRESSION: 1. Moderate left foraminal stenosis at C3-C4. 2. No significant central canal stenosis to explain lower extremity weakness. Electronically signed by: Lonni Necessary MD 07/18/2024 04:56 PM EST RP Workstation: [id number]      Vernal Alstrom, MD  [facility] 07/20/2024  If 7PM-7AM, please contact night-coverage        "

## 2024-07-20 NOTE — Progress Notes (Signed)
 [facility] night cross cover note:   I was notified by the patient's RN  of Bladder scan volume .  Straight cath x 1 ordered now.     Eva Pore, DO Hospitalist

## 2024-07-20 NOTE — Progress Notes (Signed)
 STROKE TEAM PROGRESS NOTE    SIGNIFICANT HOSPITAL EVENTS 12/16 - pt came to ED for stroke workup, MRI negative, was discharged 12/17 - pt returned to ED after worsening of symptoms, repeat MRI positive  INTERIM HISTORY/SUBJECTIVE Patient is having skin tears in the WOC nurses seeing him.  Patient is less agitated today but still appears to have episodes of somnolence but at times he is awake and communicative.  Pain is limited mainly.  His wife is at the bedside.  I had goals of care discussion with her and another family member.  Wife seemed agreeable to DNR MRI brain is motion degraded but shows no large vessel stenosis.  Carotid ultrasound shows bilateral 40-59% stenosis.  Echocardiogram showed normal ejection fraction. OBJECTIVE  CBC    Component Value Date/Time   WBC 12.2 (H) 07/20/2024 0600   RBC 3.67 (L) 07/20/2024 0600   HGB 10.7 (L) 07/20/2024 0600   HGB 14.6 07/10/2020 1423   HCT 32.3 (L) 07/20/2024 0600   HCT 41.3 07/10/2020 1423   PLT 318 07/20/2024 0600   PLT 205 07/10/2020 1423   MCV 88.0 07/20/2024 0600   MCV 97 07/10/2020 1423   MCH 29.2 07/20/2024 0600   MCHC 33.1 07/20/2024 0600   RDW 14.1 07/20/2024 0600   RDW 13.8 07/10/2020 1423   LYMPHSABS 0.5 (L) 07/18/2024 1402   LYMPHSABS 1.2 07/10/2020 1423   MONOABS 0.6 07/18/2024 1402   EOSABS 0.0 07/18/2024 1402   EOSABS 0.2 07/10/2020 1423   BASOSABS 0.0 07/18/2024 1402   BASOSABS 0.0 07/10/2020 1423    BMET    Component Value Date/Time   NA 142 07/20/2024 0600   NA 137 08/08/2023 1157   K 3.9 07/20/2024 0600   CL 105 07/20/2024 0600   CO2 24 07/20/2024 0600   GLUCOSE 106 (H) 07/20/2024 0600   BUN 22 07/20/2024 0600   BUN 19 08/08/2023 1157   CREATININE 1.17 07/20/2024 0600   CREATININE 0.86 03/09/2016 0926   CALCIUM  8.9 07/20/2024 0600   EGFR 53 (L) 08/08/2023 1157   GFRNONAA >60 07/20/2024 0600    IMAGING past 24 hours VAS US  CAROTID Result Date: 07/20/2024 Carotid Arterial Duplex Study  Patient Name:  WILKIE ZENON [facility]  Date of Exam:   07/19/2024 Medical Rec #: [id number]         Accession #:    [id number] Date of Birth: Dec 19, 1935        Patient Gender: M Patient Age:   88 years Exam Location:  [facility] Procedure:      VAS US  CAROTID Referring Phys: VERNAL POKHREL --------------------------------------------------------------------------------  Indications:       CVA. Risk Factors:      Hyperlipidemia, past history of smoking, prior MI, coronary                    artery disease. Other Factors:     Afib, COPD,. Limitations        VERY limited exam due to patient with altered mental status                    (combative, vocalizing, hitting tech, poor ultrasound tissue                    interface.) Comparison Study:  No previous exams Performing Technologist: Jody Hill RVT, RDMS  Examination Guidelines: A complete evaluation includes B-mode imaging, spectral Doppler, color Doppler, and power Doppler as needed of all accessible portions of each vessel.  Bilateral testing is considered an integral part of a complete examination. Limited examinations for reoccurring indications may be performed as noted.  Right Carotid Findings: +----------+--------+--------+--------+---------------------+------------------+           PSV cm/sEDV cm/sStenosisPlaque Description   Comments           +----------+--------+--------+--------+---------------------+------------------+ CCA Prox  79      10              heterogenous                            +----------+--------+--------+--------+---------------------+------------------+ CCA Distal86      10              heterogenous and                                                          calcific                                +----------+--------+--------+--------+---------------------+------------------+ ICA Prox  264     35      40-59%  calcific             not well                                                                   visualized         +----------+--------+--------+--------+---------------------+------------------+ ICA Mid                                                unable to                                                                 visualize          +----------+--------+--------+--------+---------------------+------------------+ ICA Distal                                             unable to                                                                 visualize          +----------+--------+--------+--------+---------------------+------------------+ ECA       213     17                                                      +----------+--------+--------+--------+---------------------+------------------+ +----------+--------+-------+----------------+-------------------+  PSV cm/sEDV cmsDescribe        Arm Pressure (mmHG) +----------+--------+-------+----------------+-------------------+ [id number]            Multiphasic, WNL                    +----------+--------+-------+----------------+-------------------+ +---------+--------+--+--------+--+---------+ VertebralPSV cm/s62EDV cm/s12Antegrade +---------+--------+--+--------+--+---------+  Left Carotid Findings: +----------+--------+--------+--------+---------------------+------------------+           PSV cm/sEDV cm/sStenosisPlaque Description   Comments           +----------+--------+--------+--------+---------------------+------------------+ CCA Prox  101     17              homogeneous and                                                           calcific                                +----------+--------+--------+--------+---------------------+------------------+ CCA Distal113     17                                                      +----------+--------+--------+--------+---------------------+------------------+ ICA Prox  216     42      40-59%   calcific             not well                                                                  visualized         +----------+--------+--------+--------+---------------------+------------------+ ICA Mid                                                unable to                                                                 visualize          +----------+--------+--------+--------+---------------------+------------------+ ICA Distal                                             unable to  visualize          +----------+--------+--------+--------+---------------------+------------------+ ECA       95      11                                                      +----------+--------+--------+--------+---------------------+------------------+ +----------+--------+--------+----------------+-------------------+           PSV cm/sEDV cm/sDescribe        Arm Pressure (mmHG) +----------+--------+--------+----------------+-------------------+ [id number]             Multiphasic, WNL                    +----------+--------+--------+----------------+-------------------+ +---------+--------+--+--------+--+---------+ VertebralPSV cm/s43EDV cm/s12Antegrade +---------+--------+--+--------+--+---------+   Summary: Right Carotid: Velocities in the right ICA are consistent with a 40-59%                stenosis. The ECA appears >50% stenosed. Left Carotid: Velocities in the left ICA are consistent with a 40-59% stenosis.  *See table(s) above for measurements and observations.  Electronically signed by Eather Popp MD on 07/20/2024 at 10:52:26 AM.    Final    MR ANGIO HEAD WO CONTRAST Result Date: 07/19/2024 EXAM: MR Angiography Head without intravenous Contrast. 07/19/2024 01:11:05 PM TECHNIQUE: Magnetic resonance angiography images of the head without intravenous contrast. Multiplanar 2D and 3D reformatted  images are provided for review. COMPARISON: MRA head Dec 05, 2022 CLINICAL HISTORY: Stroke/TIA, determine embolic source FINDINGS: Moderately motion limited study.  Within this limitation: ANTERIOR CIRCULATION: No significant stenosis of the internal carotid arteries. No significant stenosis of the anterior cerebral arteries. No significant stenosis of the middle cerebral arteries. No aneurysm. POSTERIOR CIRCULATION: No significant stenosis of the posterior cerebral arteries. No significant stenosis of the basilar artery. No significant stenosis of the vertebral arteries. No aneurysm. IMPRESSION: 1. Moderately motion limited study without large vessel occlusion or high grade stenosis. Electronically signed by: Gilmore Molt 07/19/2024 05:47 PM EST RP Workstation: [id number]   ECHOCARDIOGRAM COMPLETE Result Date: 07/19/2024    ECHOCARDIOGRAM REPORT   Patient Name:   KASEM MOZER Date of Exam: 07/19/2024 Medical Rec #:  [id number]        Height:       70.0 in Accession #:    [id number]       Weight:       164.9 lb Date of Birth:  [birth year]       BSA:          1.923 m Patient Age:    88 years         BP:           206/183 mmHg Patient Gender: M                HR:           60 bpm. Exam Location:  Inpatient Procedure: 2D Echo, Cardiac Doppler and Color Doppler (Both Spectral and Color            Flow Doppler were utilized during procedure). Indications:    Stroke I63.9  History:        Patient has prior history of Echocardiogram examinations, most                 recent 09/07/2017. Stroke; Signs/Symptoms:Hypertensive Heart  Disease.  Sonographer:    Sydnee Wilson Referring Phys: LAMAR DESS IMPRESSIONS  1. Left ventricular ejection fraction, by estimation, is 60 to 65%. The left ventricle has normal function. The left ventricle has no regional wall motion abnormalities. There is mild left ventricular hypertrophy. Left ventricular diastolic parameters are indeterminate.  2. Right ventricular  systolic function is normal. The right ventricular size is normal.  3. A small pericardial effusion is present. There is no evidence of cardiac tamponade.  4. The mitral valve is normal in structure. No evidence of mitral valve regurgitation. No evidence of mitral stenosis.  5. The aortic valve is tricuspid. Aortic valve regurgitation is not visualized. No aortic stenosis is present. Aortic valve area, by VTI measures 2.61 cm. Aortic valve mean gradient measures 3.5 mmHg. Aortic valve Vmax measures 1.02 m/s.  6. The inferior vena cava is normal in size with greater than 50% respiratory variability, suggesting right atrial pressure of 3 mmHg. FINDINGS  Left Ventricle: Left ventricular ejection fraction, by estimation, is 60 to 65%. The left ventricle has normal function. The left ventricle has no regional wall motion abnormalities. The left ventricular internal cavity size was normal in size. There is  mild left ventricular hypertrophy. Left ventricular diastolic parameters are indeterminate. Normal left ventricular filling pressure. Right Ventricle: The right ventricular size is normal. No increase in right ventricular wall thickness. Right ventricular systolic function is normal. Left Atrium: Left atrial size was normal in size. Right Atrium: Right atrial size was normal in size. Pericardium: A small pericardial effusion is present. There is diastolic collapse of the right ventricular free wall. There is no evidence of cardiac tamponade. Mitral Valve: The mitral valve is normal in structure. No evidence of mitral valve regurgitation. No evidence of mitral valve stenosis. Tricuspid Valve: The tricuspid valve is normal in structure. Tricuspid valve regurgitation is not demonstrated. No evidence of tricuspid stenosis. Aortic Valve: The aortic valve is tricuspid. Aortic valve regurgitation is not visualized. No aortic stenosis is present. Aortic valve mean gradient measures 3.5 mmHg. Aortic valve peak gradient  measures 4.2 mmHg. Aortic valve area, by VTI measures 2.61 cm. Pulmonic Valve: The pulmonic valve was normal in structure. Pulmonic valve regurgitation is not visualized. No evidence of pulmonic stenosis. Aorta: The aortic root is normal in size and structure. Venous: The inferior vena cava is normal in size with greater than 50% respiratory variability, suggesting right atrial pressure of 3 mmHg. IAS/Shunts: No atrial level shunt detected by color flow Doppler.  LEFT VENTRICLE PLAX 2D LVIDd:         3.80 cm     Diastology LVIDs:         2.70 cm     LV e' medial:    10.40 cm/s LV PW:         1.30 cm     LV E/e' medial:  8.4 LV IVS:        1.00 cm     LV e' lateral:   12.90 cm/s LVOT diam:     2.00 cm     LV E/e' lateral: 6.8 LV SV:         56 LV SV Index:   29 LVOT Area:     3.14 cm  LV Volumes (MOD) LV vol d, MOD A2C: 72.8 ml LV vol d, MOD A4C: 71.7 ml LV vol s, MOD A2C: 25.2 ml LV vol s, MOD A4C: 28.9 ml LV SV MOD A2C:     47.6 ml LV SV  MOD A4C:     71.7 ml LV SV MOD BP:      45.7 ml RIGHT VENTRICLE RV S prime:     11.70 cm/s TAPSE (M-mode): 1.6 cm LEFT ATRIUM             Index        RIGHT ATRIUM           Index LA diam:        3.40 cm 1.77 cm/m   RA Area:     13.90 cm LA Vol (A2C):   21.9 ml 11.39 ml/m  RA Volume:   33.10 ml  17.21 ml/m LA Vol (A4C):   20.3 ml 10.56 ml/m LA Biplane Vol: 21.6 ml 11.23 ml/m  AORTIC VALVE AV Area (Vmax):    3.55 cm AV Area (Vmean):   2.48 cm AV Area (VTI):     2.61 cm AV Vmax:           101.89 cm/s AV Vmean:          87.550 cm/s AV VTI:            0.216 m AV Peak Grad:      4.2 mmHg AV Mean Grad:      3.5 mmHg LVOT Vmax:         115.00 cm/s LVOT Vmean:        69.200 cm/s LVOT VTI:          0.179 m LVOT/AV VTI ratio: 0.83  AORTA Ao Root diam: 3.30 cm Ao Asc diam:  4.20 cm MITRAL VALVE MV Area (PHT): 3.95 cm    SHUNTS MV Decel Time: 192 msec    Systemic VTI:  0.18 m MV E velocity: 87.50 cm/s  Systemic Diam: 2.00 cm MV A velocity: 89.60 cm/s MV E/A ratio:  0.98 Annabella Scarce MD Electronically signed by Annabella Scarce MD Signature Date/Time: 07/19/2024/2:39:34 PM    Final     Vitals:   07/20/24 0002 07/20/24 0412 07/20/24 0909 07/20/24 1117  BP: 126/60 (!) 95/57 (!) 122/59 134/89  Pulse: 98 (!) 59 (!) 101 (!) 102  Resp: 17 19 (!) 33 (!) 23  Temp: 98.7 F (37.1 C) 99.1 F (37.3 C) 99.1 F (37.3 C) 99.6 F (37.6 C)  TempSrc: Oral Oral Oral Oral  SpO2: 94% 96% 98% 97%  Weight:         PHYSICAL EXAM General: Drowsy, well-nourished, well-developed elderly Caucasian male t in no acute distress Psych:  Unable to assess with sedation CV: Regular rate and rhythm on monitor Respiratory:  Regular, unlabored respirations on room air Skin: Significant bruising on bil arms.   NEURO:  Mental Status: Drowsy can be partially aroused speech is difficult to understand and slurred.  Follows simple midline commands.   Hold  Cranial Nerves: II: PERRL III, IV, VI, V, VII, VIII, IX, X, XI, XII: Unable to assess with sedation  Motor: unable to assess strength, was withdrawing each limb to pain and localizing to truncal pain, tone is appropriate.  Sensation, coordination, gait: unable to assess Gait- deferred      ASSESSMENT/PLAN  Mr. Larry Briggs is a 88 y.o. male with history of CAD, HTN, HLD admitted for stroke.   Acute Ischemic Infarct:  posterior limg of the L itnernal capsule. Etiology:  small vessel disease though the patient has chronic A-fib and is on anticoagulation CT head: Negative.  MRA: Motion degraded but no large vessel stenosis. Carotid US : Bilateral 40-59% stenosis. MRI: Acute  nonhemorrhagic 9mm infarct in the posterior limb of the left internal capsule. Atrophy. 2D Echo: EF 60 to 65%.  Left atrial size normal.   LDL 39 mg percent HgbA1c 5.4 VTE prophylaxis - SCDs Xarelto  (rivaroxaban ) daily prior to admission, now on aspirin  81 mg daily and Xarelto  (rivaroxaban ) daily Therapy recommendations:  Pending Disposition:   pending   Atrial fibrillation Home Meds: Xarelto , family reports great adherence Continue telemetry monitoring Continue anticoagulation with Xarelto , this was not a medication failure as the stroke is from a different etiology  Hypertension Home meds:  Norvasc , Chlorthalidone  Stable Blood Pressure Goal: SBP less than 160   Hyperlipidemia Home meds: Simvastatin , continue home meds LDL pending, goal < 70 Continue statin at discharge  Diabetes type II - pending Home meds: none HgbA1c  pending, goal < 7.0  Tobacco Abuse  Quit 30 years ago, ~30 pack history.  Dysphagia Patient has post-stroke dysphagia, SLP consulted    Diet   Diet NPO time specified  Advance diet as tolerated  Other Stroke Risk Factors ETOH use, alcohol level <10 Coronary artery disease   Hospital day # 2   Patient with prior left leg weakness due to prior stroke due to A-fib was on long-term anticoagulation with Xarelto  presented with right leg weakness with initial MRI was negative for stroke and patient went home but got worse and came back and repeat MRI shows acute left internal capsule infarct likely from small vessel disease.  Continue Xarelto  and no need to add aspirin  due to increased bleeding risk.  Aggressive risk factor modification.  Physical Occupational Therapy consults.  Long discussion with patient wife at the bedside and answered questions about prognosis and goals of care.  Strongly encouraged DNR.  Speech therapy for swallow eval but will likely need a feeding tube.  Consider palliative care options if patient has further decline in his condition..  Discussed with Dr. Sonjia   I personally spent a total of 35 minutes in the care of the patient today including getting/reviewing separately obtained history, performing a medically appropriate exam/evaluation, counseling and educating, placing orders, referring and communicating with other health care professionals, documenting clinical  information in the EHR, independently interpreting results, and coordinating care.     Stroke team will sign off.  Kindly call for questions   Eather Popp, MD Medical Director [facility] Pager: [phone] 07/20/2024 12:00 PM   To contact Stroke Continuity provider, please refer to [email or web address]. After hours, contact General Neurology

## 2024-07-20 NOTE — Progress Notes (Signed)
 Modified Barium Swallow Study  Patient Details  Name: Larry Briggs MRN: [id number] Date of Birth: 1935/09/12  Today's Date: 07/20/2024  Modified Barium Swallow completed.  Full report located under Chart Review in the Imaging Section.  History of Present Illness SONG GARRIS is a 88 y.o. male from home with weakness on the right side with difficulty ambulating. MRI  Acute/subacute nonhemorrhagic 9 mm infarct in the posterior limb of the left internal capsule with associated T2 and FLAIR hyperintensities. PMH: CVA 2025 no dysphagia per wife, CAD, hypertension, hyperlipidemia   Clinical Impression Study was not recorded therefore cannot complete documentation for MBSImP. Pt  inconsistently able to follow commands with decreased cognition. He presents with a significant neurogenic oropharyngeal dysphagia  marked by slow and weak propulsion, disorganized/repetative  lingual movements with residue. Decreased safety and efficency with  laryngeal penetration and aspiration across multiple consistencies, mostly silent despite smaller volumes via teaspoon. Spontaneous swallows (3-4) reduced penetration briefly however he continued to penetrate and aspirate with subsequent consistencies. Mod-max residue remained throughout study. He is unsafe for po's at this time with hopeful improvement and ability to initiate po's with repeated study. MD has ordered Cortrak and pt may have ice chips after oral care with supervision. ST to follow. Factors that may increase risk of adverse event in presence of aspiration [name] & [name] 2021):    Swallow Evaluation Recommendations Recommendations: NPO      Dustin Olam Bull 07/20/2024,2:54 PM

## 2024-07-20 NOTE — Procedures (Signed)
 Cortrak  Person Inserting Tube:  Merilee, Amy Belloso L, RD Tube Type:  Cortrak - 43 inches Tube Size:  10 Tube Location:  Left nare Secured by: Bridle Initial Placement:  Gastric Technique Used to Measure Tube Placement:  Marking at nare/corner of mouth Cortrak Secured At:  65 cm Initial Placement Verification:  Cortrak device (Registered Dieticians Only)  Cortrak Tube Team Note:  Consult received to place a Cortrak feeding tube.   No x-ray is required. RN may begin using tube.   If the tube becomes dislodged please keep the tube and contact the Cortrak team at [email or web address] for replacement.  If after hours and replacement cannot be delayed, place a NG tube and confirm placement with an abdominal x-ray.    Nestora Merilee RD, LDN Registered Dietitian I Please see AMION for contact information

## 2024-07-20 NOTE — Evaluation (Addendum)
 Clinical/Bedside Swallow Evaluation Patient Details  Name: Larry Briggs MRN: [id number] Date of Birth: [birth year]  Today's Date: 07/20/2024 Time: SLP Start Time (ACUTE ONLY): 1143 SLP Stop Time (ACUTE ONLY): 1200 SLP Time Calculation (min) (ACUTE ONLY): 17 min  Past Medical History:  Past Medical History:  Diagnosis Date   CAD (coronary artery disease)    Remote MI in 1980. CABG 1995. Stent to left main in 2004. Last cath in April of 2012. EF 50%; patent LIMA to DX/LAD with collateralization of the distal right, patent SVG to OM, occluded SVG to distal RCA, and patent stent to LAD   History of heart attack 1980   Hypercholesteremia    Hypertension    Increased glucose level    Obesity    Past Surgical History:  Past Surgical History:  Procedure Laterality Date   CARDIAC CATHETERIZATION  1993   CARDIAC CATHETERIZATION  April 2012   Mild reduction if EF at 50%. Patent LIMA to DX/LAD with collateralization to distal RCA, patent SVG to OM and occluded SVG to distal right and patent stent to LAD/Left main;   CORONARY ARTERY BYPASS GRAFT  05/1994   x5 LIMA to LAD & DX, SVG to LCX, SVG to RCA   CORONARY STENT PLACEMENT  2004   Stent to the L main   INTRAMEDULLARY (IM) NAIL INTERTROCHANTERIC Left 09/05/2017   Procedure: INTRAMEDULLARY (IM) NAIL INTERTROCHANTRIC;  Surgeon: Beverley Evalene BIRCH, MD;  Location: [facility] OR;  Service: Orthopedics;  Laterality: Left;   HPI:  Larry Briggs is a 88 y.o. male from home with weakness on the right side with difficulty ambulating. MRI  Acute/subacute nonhemorrhagic 9 mm infarct in the posterior limb of the left internal capsule with associated T2 and FLAIR hyperintensities. PMH: CVA 2024 no dysphagia per wife, CAD, hypertension, hyperlipidemia    Assessment / Plan / Recommendation  Clinical Impression  Pt exhibits indications of oropharyngeal dysphagia. Wife reports pt using dentures prior but he reported ill fit. Today dentures would not remain in  place despite denture cream and removed. Pt able to participate with although periods of drowsiness,  confusion needing verbal cueing. No s/s aspiration with cup sips however poor labial seal, awareness with leakage  with thin and suspect smaller sips obtained. Use of straw resulted in consistent reflexive coughs. Slower transit of applesauce with suspected delayed swallow initiation. Instrumental assessment warranted with MBS scheduled for 1:00 today. SLP Visit Diagnosis: Dysphagia, unspecified (R13.10)    Aspiration Risk  Mild aspiration risk;Moderate aspiration risk    Diet Recommendation    NPO       Other Recommendations       Swallow Evaluation Recommendations Recommendations: NPO   Assistance Recommended at Discharge Frequent or constant Supervision/Assistance  Functional Status Assessment Patient has had a recent decline in their functional status and demonstrates the ability to make significant improvements in function in a reasonable and predictable amount of time.  Frequency and Duration min 1 x/week          Prognosis        Swallow Study   General Date of Onset: 07/20/24 HPI: Larry Briggs is a 88 y.o. male from home with weakness on the right side with difficulty ambulating. MRI  Acute/subacute nonhemorrhagic 9 mm infarct in the posterior limb of the left internal capsule with associated T2 and FLAIR hyperintensities. PMH: CAD, hypertension, hyperlipidemia Type of Study: Bedside Swallow Evaluation Previous Swallow Assessment:  (none) Diet Prior to this Study: NPO Temperature Spikes Noted:  No Respiratory Status: Room air History of Recent Intubation: No Behavior/Cognition: Cooperative;Pleasant mood;Confused;Requires cueing (drowsy at times) Oral Cavity Assessment: Dry Oral Care Completed by SLP: No Oral Cavity - Dentition: Dentures, top;Dentures, bottom;Other (Comment) (do not fit with denture cream and removed) Self-Feeding Abilities: Needs assist Patient  Positioning: Upright in bed Baseline Vocal Quality: Normal    Oral/Motor/Sensory Function Overall Oral Motor/Sensory Function: Generalized oral weakness (appeared slightly weaker on left side however stroke affected right side)   Ice Chips Ice chips: Not tested   Thin Liquid Thin Liquid: Impaired Presentation: Straw;Cup Oral Phase Impairments: Reduced labial seal Oral Phase Functional Implications: Left anterior spillage;Right anterior spillage Pharyngeal  Phase Impairments: Cough - Immediate    Nectar Thick Nectar Thick Liquid: Not tested   Honey Thick Honey Thick Liquid: Not tested   Puree Puree: Impaired Oral Phase Functional Implications:  (labial residue) Pharyngeal Phase Impairments: Suspected delayed Swallow   Solid     Solid: Not tested      Larry Briggs 07/20/2024,12:43 PM

## 2024-07-20 NOTE — Progress Notes (Signed)
 Initial Nutrition Assessment  DOCUMENTATION CODES:   Severe malnutrition in context of social or environmental circumstances (functional decline related to aging)  INTERVENTION:  Initiate tube feeding via Cortrak: initiate at 20 ml/h and increase 10 ml q10h until goal rate is reached Osmolite at 50 ml/h (1200 ml per day) Prosource TF20 60 ml daily Provides 1880 kcal, 95 gm protein, 914 ml free water  daily FWF: 150 ml q6h (total free water : 1514 ml daily)  Monitor magnesium  and phosphorus daily x 4 occurrences, MD to replete as needed, as pt is at risk for refeeding syndrome given severe malnutrition. Daily thiamine 100 mg x 7 days  Monitor tolerance and progress with SLP  NUTRITION DIAGNOSIS:   Severe Malnutrition related to social / environmental circumstances (functional decline related to aging) as evidenced by severe fat depletion, severe muscle depletion.  GOAL:   Patient will meet greater than or equal to 90% of their needs  MONITOR:   Diet advancement, Labs, TF tolerance  REASON FOR ASSESSMENT:   Consult Enteral/tube feeding initiation and management  ASSESSMENT:   Pt with hx of HTN, HLD, CAD, prior stroke, multiple falls within the last year. Admitted w/ R sided weakness, diagnosed CVA.  12/17 admitted 12/18 concern for aspiration w/ pills, NPO 12/19 unable to complete MBS d/t mentation, NPO  Pt dealing with post stroke agitation, confusion, lethargy, and dysphagia. Cortrak placement pending. Pt remains NPO after failing to follow commands during MBS trial.   Pt resting in bed during assessment. Pt did not respond to RD voice at time, but intermittently responded to wife's voice. Wife provided hx. Wife reports pt had been doing well PTA although his function had recently declined. In the last year, pt has fallen multiple times which wife noticed in conjunction with her needing to assist pt more at home. Pt still performing ADLs but required more assistance for  mobility (using a wheelchair or walker), assistance with meal preparation, and assistance with prompting memory. Wife reports COPD tends to be more exacerbated when pt is active but does not impact pt's eating ability. Wife reports pt had a fair appetite but has not been eating as much as he used to. Pt would still eat 3x per day but would eat small amounts or snacks. Pt would eat egg sandwiches, nabs crackers, meat and vegetables. Recently, pt complained of dentures not working well to chew meats, but wife would cut meat up small and pt had no issues swallowing.  Wife reports pt fractured hip in 2019 and pt lost a lot of weight at that time and has never recovered. Prior weight was over 200# and now UBW around 164#. Pt has been maintaining weight over last few months.   Nutrition focused physical exam shows severe fat and severe muscle depletions, indicative of malnutrition. Suspect malnutrition related to overall functional decline with aging. Pt also has severe skin tears on bilateral arms.  Plan to place Cortrak today and initiate tube feeds. Pt will continue to work with SLP on swallowing, difficult to predict timeline for tube feeding necessity at this time. Pt has been without nutrition for 4 days and had limited nutrition prior, may be a refeed risk and will proceed with caution.   Nutritionally Relevant Medications: Vitamin B12 Sodium chloride  infusion  Labs: Potassium 3.9 (WNL) Mag 2.1 (WNL) A1c 5.4   NUTRITION - FOCUSED PHYSICAL EXAM:  Flowsheet Row Most Recent Value  Orbital Region Severe depletion  Upper Arm Region Unable to assess  [arms wrapped]  Thoracic and Lumbar Region Severe depletion  Buccal Region Moderate depletion  Temple Region Moderate depletion  Clavicle Bone Region Severe depletion  Clavicle and Acromion Bone Region Severe depletion  Scapular Bone Region Severe depletion  Dorsal Hand Unable to assess  Patellar Region Severe depletion  Anterior Thigh Region  Severe depletion  Posterior Calf Region Severe depletion  Edema (RD Assessment) None  Hair Reviewed  Eyes Unable to assess  Mouth Reviewed  Skin Reviewed  Nails Reviewed    Diet Order:   Diet Order             Diet NPO time specified  Diet effective now                   EDUCATION NEEDS:   No education needs have been identified at this time  Skin:  Skin Assessment: Skin Integrity Issues: Skin Integrity Issues:: Other (Comment) Other: severe skin tears: R elbow to lower R hand, L elbow and arm, L knee Per WOC note:  Left elbow and arm and left leg with several red shallow partial thickness abrasions.  Right knee  is full thickness abrasion, 5X3X.2cm, old dried blood Right arm with full thickness abrasions, elbow 8X8X.2cm with loose skin flap hanging to edges Right lower arm full thickness abrasion, red and moist 6X6X.2cm, with only 50% or less of skin approximated over red moist wound beds.  All with small amt pink drainage, no further active bleeding.   Last BM:  unknown  Height:   Ht Readings from Last 1 Encounters:  07/17/24 5' 10 (1.778 m)    Weight:   Wt Readings from Last 1 Encounters:  07/18/24 74.8 kg    Ideal Body Weight:  75.5 kg  BMI:  Body mass index is 23.66 kg/m.  Estimated Nutritional Needs:   Kcal:  1800-2000  Protein:  90-110g  Fluid:  1.8-2L    Josette Glance, MS, RDN, LDN Clinical Dietitian I Please reach out via secure chat

## 2024-07-20 NOTE — Plan of Care (Signed)

## 2024-07-20 NOTE — Consult Note (Addendum)
 WOC Nurse Consult Note: Pt fell prior to admission and has multiple partial and full thickness skin tears to bilat arms and legs.  He has very thin fragile skin and family members at the bedside report skin tears are a frequent occurrence, and he had a significant amount of bleeding from the right arm yesterday, which had sections of degloving to elbow and right lower anterior arm.  Left elbow and arm and left leg with several red shallow partial thickness abrasions.  Right knee  is full thickness abrasion, 5X3X.2cm, old dried blood Right arm with full thickness abrasions, elbow 8X8X.2cm with loose skin flap hanging to edges Right lower arm full thickness abrasion, red and moist 6X6X.2cm, with only 50% or less of skin approximated over red moist wound beds.  All with small amt pink drainage, no further active bleeding.     Dressing procedure/placement/frequency: Topical treatment orders provided for bedside nurses to perform as follows to reduce pain and bleeding with dressing changes: 1. Apply double folder Xeroform gauze to upper/elbow/middle/lower arm and right knee Q day, then cover with ABD pads and kerlex 2. Foam dressings to other various wounds, change Q 3 days or PRN soiling  Please re-consult if further assistance is needed.  Thank-you,  Stephane Fought MSN, RN, CWOCN, CWCN-AP, CNS Contact Mon-Fri 0700-1500: [phone]

## 2024-07-20 NOTE — Progress Notes (Signed)
 Speech Language Pathology  Patient Details Name: Larry Briggs MRN: [id number] DOB: 28-Oct-1935 Today's Date: 07/20/2024 Time:  -       Pt passed swallow screen but had difficulty with pills yesterday. Will perform BSE once denture cream arrives.                                         Dustin Olam Bull  07/20/2024, 10:30 AM

## 2024-07-21 ENCOUNTER — Inpatient Hospital Stay (HOSPITAL_COMMUNITY)

## 2024-07-21 DIAGNOSIS — I639 Cerebral infarction, unspecified: Secondary | ICD-10-CM | POA: Diagnosis not present

## 2024-07-21 DIAGNOSIS — E43 Unspecified severe protein-calorie malnutrition: Secondary | ICD-10-CM | POA: Insufficient documentation

## 2024-07-21 LAB — CBC
HCT: 30 % — ABNORMAL LOW (ref 39.0–52.0)
Hemoglobin: 9.9 g/dL — ABNORMAL LOW (ref 13.0–17.0)
MCH: 28.7 pg (ref 26.0–34.0)
MCHC: 33 g/dL (ref 30.0–36.0)
MCV: 87 fL (ref 80.0–100.0)
Platelets: 298 K/uL (ref 150–400)
RBC: 3.45 MIL/uL — ABNORMAL LOW (ref 4.22–5.81)
RDW: 14.1 % (ref 11.5–15.5)
WBC: 11.2 K/uL — ABNORMAL HIGH (ref 4.0–10.5)
nRBC: 0 % (ref 0.0–0.2)

## 2024-07-21 LAB — BASIC METABOLIC PANEL WITH GFR
Anion gap: 10 (ref 5–15)
BUN: 36 mg/dL — ABNORMAL HIGH (ref 8–23)
CO2: 26 mmol/L (ref 22–32)
Calcium: 8.7 mg/dL — ABNORMAL LOW (ref 8.9–10.3)
Chloride: 106 mmol/L (ref 98–111)
Creatinine, Ser: 1.14 mg/dL (ref 0.61–1.24)
GFR, Estimated: >60 mL/min
Glucose, Bld: 186 mg/dL — ABNORMAL HIGH (ref 70–99)
Potassium: 3 mmol/L — ABNORMAL LOW (ref 3.5–5.1)
Sodium: 142 mmol/L (ref 135–145)

## 2024-07-21 LAB — GLUCOSE, CAPILLARY
Glucose-Capillary: 159 mg/dL — ABNORMAL HIGH (ref 70–99)
Glucose-Capillary: 175 mg/dL — ABNORMAL HIGH (ref 70–99)
Glucose-Capillary: 149 mg/dL — ABNORMAL HIGH (ref 70–99)
Glucose-Capillary: 166 mg/dL — ABNORMAL HIGH (ref 70–99)
Glucose-Capillary: 151 mg/dL — ABNORMAL HIGH (ref 70–99)

## 2024-07-21 LAB — MAGNESIUM: Magnesium: 2.2 mg/dL (ref 1.7–2.4)

## 2024-07-21 LAB — PHOSPHORUS: Phosphorus: 2.1 mg/dL — ABNORMAL LOW (ref 2.5–4.6)

## 2024-07-21 MED ORDER — POLYETHYL GLYCOL-PROPYL GLYCOL 0.4-0.3 % OP SOLN
1.0000 [drp] | Freq: Two times a day (BID) | OPHTHALMIC | Status: DC
  Administered 2024-07-21 – 2024-08-01 (×23): 1 [drp] via OPHTHALMIC

## 2024-07-21 MED ORDER — VITAMIN B-12 1000 MCG PO TABS
1000.0000 ug | ORAL_TABLET | Freq: Every day | ORAL | Status: DC
  Administered 2024-07-21 – 2024-08-01 (×12): 1000 ug
  Filled 2024-07-21 (×9): qty 1

## 2024-07-21 MED ORDER — POTASSIUM CHLORIDE 20 MEQ PO PACK
40.0000 meq | PACK | Freq: Once | ORAL | Status: AC
  Administered 2024-07-21: 40 meq via ORAL
  Filled 2024-07-21: qty 2

## 2024-07-21 MED ORDER — RIVAROXABAN 15 MG PO TABS
15.0000 mg | ORAL_TABLET | Freq: Every day | ORAL | Status: DC
  Administered 2024-07-21 – 2024-07-31 (×11): 15 mg
  Filled 2024-07-21 (×9): qty 1

## 2024-07-21 MED ORDER — METOPROLOL TARTRATE 25 MG/10 ML ORAL SUSPENSION
12.5000 mg | Freq: Two times a day (BID) | ORAL | Status: DC
  Administered 2024-07-22 – 2024-07-28 (×12): 12.5 mg
  Filled 2024-07-21 (×15): qty 5

## 2024-07-21 MED ORDER — SIMVASTATIN 20 MG PO TABS
40.0000 mg | ORAL_TABLET | Freq: Every day | ORAL | Status: DC
  Administered 2024-07-21 – 2024-07-31 (×11): 40 mg
  Filled 2024-07-21 (×9): qty 2

## 2024-07-21 MED ORDER — ASPIRIN 81 MG PO CHEW
81.0000 mg | CHEWABLE_TABLET | Freq: Every day | ORAL | Status: DC
  Filled 2024-07-21: qty 1

## 2024-07-21 MED ORDER — AMLODIPINE BESYLATE 5 MG PO TABS
5.0000 mg | ORAL_TABLET | Freq: Every day | ORAL | Status: DC
  Administered 2024-07-21: 5 mg

## 2024-07-21 MED ORDER — ASPIRIN 81 MG PO CHEW
81.0000 mg | CHEWABLE_TABLET | Freq: Every day | ORAL | Status: DC
  Administered 2024-07-21: 81 mg

## 2024-07-21 MED ORDER — CHLORHEXIDINE GLUCONATE CLOTH 2 % EX PADS
6.0000 | MEDICATED_PAD | Freq: Every day | CUTANEOUS | Status: DC
  Administered 2024-07-21 – 2024-07-31 (×10): 6 via TOPICAL

## 2024-07-21 NOTE — Progress Notes (Signed)
 "  PROGRESS NOTE    Larry Briggs  FMW:[id number] DOB: Mar 18, 1936 DOA: 07/18/2024 PCP: Larry Nottingham, MD   Brief Narrative:    Assessment and Plan:  Acute stroke Patient presented with symptoms including . MRI brain (12/17) confirms acute/subacute nonhemorrhagic 9 mm infarct in the posterior limb of the left internal capsule. MRA head significant for no large vessel occlusion or high grade stenosis. LDL of 39. Hemoglobin A1C of 5.4%. Transthoracic Echocardiogram significant for LVEF of 60-65% with indeterminate diastolic parameters and  no atrial level shunt. Neurology recommendations for continuing Xarelto . PT/OT recommendations for acute inpatient rehabilitation. -Continue Xarelto  -Discontinue aspirin   Agitation Confusion Possibly related to stroke vs hospital delirium. Improved.  Primary hypertension Patient is on amlodipine  and metoprolol  succinate as an outpatient. Metoprolol  held on admission. -Hold amlodipine  and start metoprolol   CAD Noted. No chest pain.  Paroxysmal atrial fibrillation Noted on EKG. Patient has a known history of atrial fibrillation. Patient also with frequent PVCs. Echo this admission with normal LVEF and normal atria. Patient follows with Larry. [facility] as an outpatient. He is on Xarelto  and Toprol  XL as an outpatient; Toprol  XL not resumed on admission. -Continue Xarelto  -Start metoprolol  tartrate 12.5 mg BID while requiring a feeding tube  Dysphagia Related to acute stroke. Patient seen and evaluated by speech therapy with recommendation for NPO. Cortrak NG tube placed on 12/19 and tube feeds started. -SLP recommendations (12/19): NPO -Dietitian recommendations (12/19): Initiate tube feeding via Cortrak: initiate at 20 ml/h and increase 10 ml q10h until goal rate is reached Osmolite at 50 ml/h (1200 ml per day) Prosource TF20 60 ml daily Provides 1880 kcal, 95 gm protein, 914 ml free water  daily FWF: 150 ml q6h (total free water : 1514 ml  daily)   Monitor magnesium  and phosphorus daily x 4 occurrences, MD to replete as needed, as pt is at risk for refeeding syndrome given severe malnutrition. Daily thiamine  100 mg x 7 days  Pericardial effusion Noted on echo. Noted as small in volume with evidence of diastolic collapse of the right ventricular free wall without evidence of cardiac tamponade.   DVT prophylaxis: Xarelto  Code Status:   Code Status: Full Code Family Communication: Wife, son and granddaughter at bedside Disposition Plan: Discharge possibly to acute inpatient rehabilitation vs SNF. If discharge to SNF, will need to have NG tube removed vs transition to PEG tube   Consultants:  Neurology  Procedures:  Transthoracic Echocardiogram  Antimicrobials: None    Subjective: Patient without specific concerns this morning.  Objective: BP 108/87 (BP Location: Left Leg)   Pulse 73   Temp 99 F (37.2 C) (Oral)   Resp (!) 26   Wt 82 kg   SpO2 98%   BMI 25.94 kg/m   Examination:  General exam: Appears calm and comfortable. Respiratory system: Clear to auscultation. Respiratory effort normal. Cardiovascular system: S1 & S2 heard, irregular rhythm, normal rate. Gastrointestinal system: Abdomen is nondistended, soft and nontender. Normal bowel sounds heard. Central nervous system: Alert and oriented to person and place. Musculoskeletal: No edema. No calf tenderness   Data Reviewed: I have personally reviewed following labs and imaging studies  CBC Lab Results  Component Value Date   WBC 11.2 (H) 07/21/2024   RBC 3.45 (L) 07/21/2024   HGB 9.9 (L) 07/21/2024   HCT 30.0 (L) 07/21/2024   MCV 87.0 07/21/2024   MCH 28.7 07/21/2024   PLT 298 07/21/2024   MCHC 33.0 07/21/2024   RDW 14.1 07/21/2024   LYMPHSABS 0.5 (  L) 07/18/2024   MONOABS 0.6 07/18/2024   EOSABS 0.0 07/18/2024   BASOSABS 0.0 07/18/2024     Last metabolic panel Lab Results  Component Value Date   NA 142 07/21/2024   K 3.0 (L)  07/21/2024   CL 106 07/21/2024   CO2 26 07/21/2024   BUN 36 (H) 07/21/2024   CREATININE 1.14 07/21/2024   GLUCOSE 186 (H) 07/21/2024   GFRNONAA >60 07/21/2024   GFRAA 77 07/10/2020   CALCIUM  8.7 (L) 07/21/2024   PHOS 2.1 (L) 07/21/2024   PROT 6.3 (L) 07/18/2024   ALBUMIN  4.1 07/18/2024   LABGLOB 2.6 07/10/2020   AGRATIO 1.6 07/10/2020   BILITOT 1.0 07/18/2024   ALKPHOS 90 07/18/2024   AST 20 07/18/2024   ALT 8 07/18/2024   ANIONGAP 10 07/21/2024    GFR: Estimated Creatinine Clearance: 48 mL/min (by C-G formula based on SCr of 1.14 mg/dL).  No results found for this or any previous visit (from the past 240 hours).    Radiology Studies: DG Swallowing Func-Speech Pathology Result Date: 07/20/2024 Table formatting from the original result was not included. Modified Barium Swallow Study Patient Details Name: Larry Briggs MRN: [id number] Date of Birth: Oct 07, 1935 Today's Date: 07/20/2024 HPI/PMH: HPI: Larry Briggs is a 88 y.o. male from home with weakness on the right side with difficulty ambulating. MRI  Acute/subacute nonhemorrhagic 9 mm infarct in the posterior limb of the left internal capsule with associated T2 and FLAIR hyperintensities. PMH: CVA 2025 no dysphagia per wife, CAD, hypertension, hyperlipidemia Clinical Impression: Clinical Impression: Study was not recorded therefore cannot complete documentation for MBSImP. Pt  inconsistently able to follow commands with decreased cognition. He presents with a significant neurogenic oropharyngeal dysphagia  marked by slow and weak propulsion, disorganized/repetative  lingual movements with residue. Decreased safety and efficency with  laryngeal penetration and aspiration across multiple consistencies, mostly silent despite smaller volumes via teaspoon. Spontaneous swallows (3-4) reduced penetration briefly however he continued to penetrate and aspirate with subsequent consistencies. Mod-max residue remained throughout study. He is  unsafe for po's at this time with hopeful improvement and ability to initiate po's with repeated study. MD has ordered Cortrak and pt may have ice chips after oral care with supervision. ST to follow. Factors that may increase risk of adverse event in presence of aspiration [name] & [name] 2021): No data recorded Recommendations/Plan: Swallowing Evaluation Recommendations Swallowing Evaluation Recommendations Recommendations: NPO Treatment Plan No data recorded Recommendations Recommendations for follow up therapy are one component of a multi-disciplinary discharge planning process, led by the attending physician.  Recommendations may be updated based on patient status, additional functional criteria and insurance authorization. Assessment: Orofacial Exam: Orofacial Exam Oral Cavity: Oral Hygiene: WFL Oral Cavity - Dentition: Other (Comment) (not donned- not fitting using cream) Orofacial Anatomy: Other (comment) Oral Motor/Sensory Function: Other (comment) (appears to have slight left labial weakness although stroke affected right side, general weakness and low tone) Anatomy: Anatomy: Other (Comment) (one image recorded of anatomy, possible khyposis) Boluses Administered: Boluses Administered Boluses Administered: Thin liquids (Level 0); Mildly thick liquids (Level 2, nectar thick); Moderately thick liquids (Level 3, honey thick); Puree  Oral Impairment Domain: Oral Impairment Domain Lip Closure: -- (study did not record, cannot complete MBSImP documentation)  Pharyngeal Impairment Domain: No data recorded Esophageal Impairment Domain: No data recorded Pill: No data recorded Penetration/Aspiration Scale Score: No data recorded Compensatory Strategies: No data recorded  General Information: Caregiver present: No  Diet Prior to this Study: NPO  Temperature : Normal   Respiratory Status: WFL   Supplemental O2: None (Room air)   History of Recent Intubation: No  Behavior/Cognition: Other (Comment); Cooperative;  Requires cueing; Pleasant mood (awake) Self-Feeding Abilities: Dependent for feeding Baseline vocal quality/speech: Hypophonia/low volume Volitional Cough: -- (more of a throat clear) No data recorded No data recorded Goal Planning: No data recorded No data recorded No data recorded No data recorded No data recorded Pain: Pain Assessment Pain Assessment: No/denies pain Faces Pain Scale: 0 Facial Expression: 0 Body Movements: 0 Muscle Tension: 0 Compliance with ventilator (intubated pts.): N/A Vocalization (extubated pts.): 0 CPOT Total: 0 Pain Location: back upon returning to supine Pain Descriptors / Indicators: Grimacing Pain Intervention(s): Monitored during session; Repositioned End of Session: Start Time:SLP Start Time (ACUTE ONLY): 1143 Stop Time: SLP Stop Time (ACUTE ONLY): 1200 Time Calculation:SLP Time Calculation (min) (ACUTE ONLY): 17 min Charges: SLP Evaluations $ SLP Speech Visit: 1 Visit SLP Evaluations $BSS Swallow: 1 Procedure $ SLP EVAL LANGUAGE/SOUND PRODUCTION: 1 Procedure SLP visit diagnosis: SLP Visit Diagnosis: Dysphagia, unspecified (R13.10) Past Medical History: Past Medical History: Diagnosis Date  CAD (coronary artery disease)   Remote MI in 20. CABG 1995. Stent to left main in 2004. Last cath in April of 2012. EF 50%; patent LIMA to DX/LAD with collateralization of the distal right, patent SVG to OM, occluded SVG to distal RCA, and patent stent to LAD  History of heart attack 1980  Hypercholesteremia   Hypertension   Increased glucose level   Obesity  Past Surgical History: Past Surgical History: Procedure Laterality Date  CARDIAC CATHETERIZATION  1993  CARDIAC CATHETERIZATION  April 2012  Mild reduction if EF at 50%. Patent LIMA to DX/LAD with collateralization to distal RCA, patent SVG to OM and occluded SVG to distal right and patent stent to LAD/Left main;  CORONARY ARTERY BYPASS GRAFT  05/1994  x5 LIMA to LAD & DX, SVG to LCX, SVG to RCA  CORONARY STENT PLACEMENT  2004  Stent to the  L main  INTRAMEDULLARY (IM) NAIL INTERTROCHANTERIC Left 09/05/2017  Procedure: INTRAMEDULLARY (IM) NAIL INTERTROCHANTRIC;  Surgeon: Beverley Evalene BIRCH, MD;  Location: [facility] OR;  Service: Orthopedics;  Laterality: Left; Dustin Olam Bull 07/20/2024, 2:54 PM     LOS: 3 days    Elgin Lam, MD [facility] 07/21/2024, 3:24 PM   If 7PM-7AM, please contact night-coverage [email or web address]  "

## 2024-07-21 NOTE — Plan of Care (Signed)
   Problem: Coping: Goal: Will verbalize positive feelings about self Outcome: Progressing

## 2024-07-21 NOTE — Plan of Care (Signed)
   Problem: Ischemic Stroke/TIA Tissue Perfusion: Goal: Complications of ischemic stroke/TIA will be minimized Outcome: Progressing

## 2024-07-22 ENCOUNTER — Other Ambulatory Visit (HOSPITAL_COMMUNITY): Payer: Self-pay

## 2024-07-22 DIAGNOSIS — E43 Unspecified severe protein-calorie malnutrition: Secondary | ICD-10-CM | POA: Diagnosis not present

## 2024-07-22 DIAGNOSIS — I639 Cerebral infarction, unspecified: Secondary | ICD-10-CM | POA: Diagnosis not present

## 2024-07-22 LAB — GLUCOSE, CAPILLARY
Glucose-Capillary: 129 mg/dL — ABNORMAL HIGH (ref 70–99)
Glucose-Capillary: 132 mg/dL — ABNORMAL HIGH (ref 70–99)
Glucose-Capillary: 151 mg/dL — ABNORMAL HIGH (ref 70–99)
Glucose-Capillary: 168 mg/dL — ABNORMAL HIGH (ref 70–99)
Glucose-Capillary: 180 mg/dL — ABNORMAL HIGH (ref 70–99)
Glucose-Capillary: 188 mg/dL — ABNORMAL HIGH (ref 70–99)

## 2024-07-22 LAB — MAGNESIUM: Magnesium: 2 mg/dL (ref 1.7–2.4)

## 2024-07-22 LAB — PHOSPHORUS: Phosphorus: 1.4 mg/dL — ABNORMAL LOW (ref 2.5–4.6)

## 2024-07-22 LAB — POTASSIUM: Potassium: 4.1 mmol/L (ref 3.5–5.1)

## 2024-07-22 MED ORDER — POTASSIUM & SODIUM PHOSPHATES 280-160-250 MG PO PACK
1.0000 | PACK | Freq: Three times a day (TID) | ORAL | Status: DC
  Administered 2024-07-22 (×3): 1
  Filled 2024-07-22 (×5): qty 1

## 2024-07-22 NOTE — Plan of Care (Signed)
   Problem: Nutrition: Goal: Risk of aspiration will decrease Outcome: Progressing

## 2024-07-22 NOTE — Progress Notes (Addendum)
 "  PROGRESS NOTE    Larry Briggs  FMW:[id number] DOB: 01-30-1936 DOA: 07/18/2024 PCP: [name], MD   Brief Narrative: Larry Briggs is a 88 y.o. male with a history of CAD, hypertension, hyperlipidemia.  Patient presented secondary to extremity weakness and was found to have an acute stroke. Hospitalization complicated by dysphagia, from stroke, requiring cortrak placement and tube feeding.   Assessment and Plan:  Acute stroke Patient presented with symptoms including . MRI brain (12/17) confirms acute/subacute nonhemorrhagic 9 mm infarct in the posterior limb of the left internal capsule. MRA head significant for no large vessel occlusion or high grade stenosis. LDL of 39. Hemoglobin A1C of 5.4%. Transthoracic Echocardiogram significant for LVEF of 60-65% with indeterminate diastolic parameters and  no atrial level shunt. Neurology recommendations for continuing Xarelto . PT/OT recommendations for acute inpatient rehabilitation. Aspirin  discontinued per neurology recommendations. -Continue Xarelto   Agitation Confusion Possibly related to stroke vs hospital delirium. Improved.  Primary hypertension Patient is on amlodipine  and metoprolol  succinate as an outpatient. Metoprolol  held on admission but restarted; amlodipine  held. -Continue metoprolol  tartrate  CAD Noted. No chest pain.  Paroxysmal atrial fibrillation Noted on EKG. Patient has a known history of atrial fibrillation. Patient also with frequent PVCs. Echo this admission with normal LVEF and normal atria. Patient follows with Dr. Jordan as an outpatient. He is on Xarelto  and Toprol  XL as an outpatient; Toprol  XL not resumed on admission. -Continue Xarelto  -Continue metoprolol  tartrate 12.5 mg BID while requiring a feeding tube  Dysphagia Related to acute stroke. Patient seen and evaluated by speech therapy with recommendation for NPO. Cortrak NG tube placed on 12/19 and tube feeds started. -SLP recommendations  (12/19): NPO -Dietitian recommendations (12/19): Initiate tube feeding via Cortrak: initiate at 20 ml/h and increase 10 ml q10h until goal rate is reached Osmolite at 50 ml/h (1200 ml per day) Prosource TF20 60 ml daily Provides 1880 kcal, 95 gm protein, 914 ml free water  daily FWF: 150 ml q6h (total free water : 1514 ml daily)   Monitor magnesium  and phosphorus daily x 4 occurrences, MD to replete as needed, as pt is at risk for refeeding syndrome given severe malnutrition. Daily thiamine  100 mg x 7 days  Severe malnutrition Tube feeds as mentioned above.  Pericardial effusion Noted on echo. Noted as small in volume with evidence of diastolic collapse of the right ventricular free wall without evidence of cardiac tamponade.   DVT prophylaxis: Xarelto  Code Status:   Code Status: Full Code Family Communication: Wife at bedside Disposition Plan: Discharge possibly to acute inpatient rehabilitation vs SNF. If discharge to SNF, will need to have NG tube removed vs transition to PEG tube   Consultants:  Neurology  Procedures:  Transthoracic Echocardiogram  Antimicrobials: None    Subjective: No issues this morning.  Objective: BP 133/68 (BP Location: Left Leg)   Pulse 81   Temp 98.2 F (36.8 C) (Oral)   Resp 19   Wt 77.7 kg   SpO2 97%   BMI 24.58 kg/m   Examination:  General exam: Appears calm and comfortable. Respiratory system: Clear to auscultation. Respiratory effort normal. Cardiovascular system: S1 & S2 heard, irregular rhythm, normal rate. Gastrointestinal system: Abdomen is nondistended, soft and nontender. Normal bowel sounds heard. Central nervous system: Alert and oriented to person and place. Musculoskeletal: No edema. No calf tenderness   Data Reviewed: I have personally reviewed following labs and imaging studies  CBC Lab Results  Component Value Date   WBC 11.2 (H)  07/21/2024   RBC 3.45 (L) 07/21/2024   HGB 9.9 (L) 07/21/2024   HCT 30.0 (L)  07/21/2024   MCV 87.0 07/21/2024   MCH 28.7 07/21/2024   PLT 298 07/21/2024   MCHC 33.0 07/21/2024   RDW 14.1 07/21/2024   LYMPHSABS 0.5 (L) 07/18/2024   MONOABS 0.6 07/18/2024   EOSABS 0.0 07/18/2024   BASOSABS 0.0 07/18/2024     Last metabolic panel Lab Results  Component Value Date   NA 142 07/21/2024   K 4.1 07/22/2024   CL 106 07/21/2024   CO2 26 07/21/2024   BUN 36 (H) 07/21/2024   CREATININE 1.14 07/21/2024   GLUCOSE 186 (H) 07/21/2024   GFRNONAA >60 07/21/2024   GFRAA 77 07/10/2020   CALCIUM  8.7 (L) 07/21/2024   PHOS 1.4 (L) 07/22/2024   PROT 6.3 (L) 07/18/2024   ALBUMIN  4.1 07/18/2024   LABGLOB 2.6 07/10/2020   AGRATIO 1.6 07/10/2020   BILITOT 1.0 07/18/2024   ALKPHOS 90 07/18/2024   AST 20 07/18/2024   ALT 8 07/18/2024   ANIONGAP 10 07/21/2024    GFR: Estimated Creatinine Clearance: 48 mL/min (by C-G formula based on SCr of 1.14 mg/dL).  No results found for this or any previous visit (from the past 240 hours).    Radiology Studies: DG CHEST PORT 1 VIEW Result Date: 07/21/2024 CLINICAL DATA:  Tachypnea. EXAM: PORTABLE CHEST 1 VIEW COMPARISON:  Chest radiograph dated 12/04/2022. FINDINGS: Feeding tube extends below diaphragm with tip beyond the inferior margin of the image. Background of emphysema. No focal consolidation, pleural effusion pneumothorax. The cardiac silhouette is within limits. Median sternotomy wires. No acute osseous pathology. IMPRESSION: 1. No acute cardiopulmonary process. 2. Emphysema. Electronically Signed   By: Vanetta Chou M.D.   On: 07/21/2024 17:29      LOS: 4 days    Elgin Lam, MD [facility] 07/22/2024, 3:30 PM   If 7PM-7AM, please contact night-coverage [email or web address]  "

## 2024-07-23 DIAGNOSIS — E43 Unspecified severe protein-calorie malnutrition: Secondary | ICD-10-CM | POA: Diagnosis not present

## 2024-07-23 DIAGNOSIS — F05 Delirium due to known physiological condition: Secondary | ICD-10-CM

## 2024-07-23 DIAGNOSIS — I639 Cerebral infarction, unspecified: Secondary | ICD-10-CM | POA: Diagnosis not present

## 2024-07-23 LAB — GLUCOSE, CAPILLARY
Glucose-Capillary: 187 mg/dL — ABNORMAL HIGH (ref 70–99)
Glucose-Capillary: 166 mg/dL — ABNORMAL HIGH (ref 70–99)
Glucose-Capillary: 192 mg/dL — ABNORMAL HIGH (ref 70–99)
Glucose-Capillary: 168 mg/dL — ABNORMAL HIGH (ref 70–99)

## 2024-07-23 LAB — BASIC METABOLIC PANEL WITH GFR
Anion gap: 9 (ref 5–15)
BUN: 36 mg/dL — ABNORMAL HIGH (ref 8–23)
CO2: 28 mmol/L (ref 22–32)
Calcium: 8.3 mg/dL — ABNORMAL LOW (ref 8.9–10.3)
Chloride: 101 mmol/L (ref 98–111)
Creatinine, Ser: 0.94 mg/dL (ref 0.61–1.24)
GFR, Estimated: >60 mL/min
Glucose, Bld: 186 mg/dL — ABNORMAL HIGH (ref 70–99)
Potassium: 3.7 mmol/L (ref 3.5–5.1)
Sodium: 139 mmol/L (ref 135–145)

## 2024-07-23 LAB — PHOSPHORUS: Phosphorus: 2.9 mg/dL (ref 2.5–4.6)

## 2024-07-23 LAB — MAGNESIUM: Magnesium: 1.9 mg/dL (ref 1.7–2.4)

## 2024-07-23 MED ORDER — QUETIAPINE FUMARATE 25 MG PO TABS
25.0000 mg | ORAL_TABLET | Freq: Every day | ORAL | Status: DC
  Administered 2024-07-23: 25 mg
  Filled 2024-07-23: qty 1

## 2024-07-23 NOTE — Progress Notes (Signed)
 "  PROGRESS NOTE    Larry Briggs  FMW:[id number] DOB: 1936-01-28 DOA: 07/18/2024 PCP: [name], MD   Brief Narrative: Larry Briggs is a 88 y.o. male with a history of CAD, hypertension, hyperlipidemia.  Patient presented secondary to extremity weakness and was found to have an acute stroke. Hospitalization complicated by dysphagia, from stroke, requiring cortrak placement and tube feeding.   Assessment and Plan:  Acute stroke Patient presented with symptoms including . MRI brain (12/17) confirms acute/subacute nonhemorrhagic 9 mm infarct in the posterior limb of the left internal capsule. MRA head significant for no large vessel occlusion or high grade stenosis. LDL of 39. Hemoglobin A1C of 5.4%. Transthoracic Echocardiogram significant for LVEF of 60-65% with indeterminate diastolic parameters and  no atrial level shunt. Neurology recommendations for continuing Xarelto . PT/OT recommendations for acute inpatient rehabilitation. Aspirin  discontinued per neurology recommendations. -Continue Xarelto   Agitation Confusion Possibly related to stroke vs hospital delirium. Improved after Geodon , but now has recurrent symptoms. -Seroquel  25 mg at night per tube; may decrease to 12.5 mg if patient becomes too somnolent  Primary hypertension Patient is on amlodipine  and metoprolol  succinate as an outpatient. Metoprolol  held on admission but restarted; amlodipine  held. -Continue metoprolol  tartrate  CAD Noted. No chest pain.  Paroxysmal atrial fibrillation Noted on EKG. Patient has a known history of atrial fibrillation. Patient also with frequent PVCs. Echo this admission with normal LVEF and normal atria. Patient follows with Dr. Jordan as an outpatient. He is on Xarelto  and Toprol  XL as an outpatient; Toprol  XL not resumed on admission. -Continue Xarelto  -Continue metoprolol  tartrate 12.5 mg BID while requiring a feeding tube  Dysphagia Related to acute stroke. Patient seen  and evaluated by speech therapy with recommendation for NPO. Cortrak NG tube placed on 12/19 and tube feeds started. -SLP recommendations (12/19): NPO -Dietitian recommendations (12/19): Initiate tube feeding via Cortrak: initiate at 20 ml/h and increase 10 ml q10h until goal rate is reached Osmolite at 50 ml/h (1200 ml per day) Prosource TF20 60 ml daily Provides 1880 kcal, 95 gm protein, 914 ml free water  daily FWF: 150 ml q6h (total free water : 1514 ml daily)   Monitor magnesium  and phosphorus daily x 4 occurrences, MD to replete as needed, as pt is at risk for refeeding syndrome given severe malnutrition. Daily thiamine  100 mg x 7 days  Severe malnutrition Tube feeds as mentioned above.  Pericardial effusion Noted on echo. Noted as small in volume with evidence of diastolic collapse of the right ventricular free wall without evidence of cardiac tamponade.   DVT prophylaxis: Xarelto  Code Status:   Code Status: Full Code Family Communication: Wife and son at bedside. Nephew on telephone. Disposition Plan: Discharge possibly to acute inpatient rehabilitation vs SNF. If discharge to SNF, will need to have NG tube removed vs transition to PEG tube   Consultants:  Neurology  Procedures:  Transthoracic Echocardiogram  Antimicrobials: None    Subjective: Patient is not cooperative today. Mittens were reapplied. Patient states that [place] is [place] the cour[place] and needs his lawyer.  Objective: BP (!) 152/58 (BP Location: Right Arm)   Pulse 97   Temp 98.8 F (37.1 C) (Oral)   Resp 18   Wt 77.7 kg   SpO2 99%   BMI 24.58 kg/m   Examination:  General exam: Appears slightly agitated but otherwise comfortable. Respiratory system: Clear to auscultation. Respiratory effort normal. Cardiovascular system: S1 & S2 heard, irregular rhythm, normal rate. Central nervous system: Alert and oriented  to self only. Psychiatry: Judgement and insight appear impaired.   Data Reviewed: I  have personally reviewed following labs and imaging studies  CBC Lab Results  Component Value Date   WBC 11.2 (H) 07/21/2024   RBC 3.45 (L) 07/21/2024   HGB 9.9 (L) 07/21/2024   HCT 30.0 (L) 07/21/2024   MCV 87.0 07/21/2024   MCH 28.7 07/21/2024   PLT 298 07/21/2024   MCHC 33.0 07/21/2024   RDW 14.1 07/21/2024   LYMPHSABS 0.5 (L) 07/18/2024   MONOABS 0.6 07/18/2024   EOSABS 0.0 07/18/2024   BASOSABS 0.0 07/18/2024     Last metabolic panel Lab Results  Component Value Date   NA 139 07/23/2024   K 3.7 07/23/2024   CL 101 07/23/2024   CO2 28 07/23/2024   BUN 36 (H) 07/23/2024   CREATININE 0.94 07/23/2024   GLUCOSE 186 (H) 07/23/2024   GFRNONAA >60 07/23/2024   GFRAA 77 07/10/2020   CALCIUM  8.3 (L) 07/23/2024   PHOS 2.9 07/23/2024   PROT 6.3 (L) 07/18/2024   ALBUMIN  4.1 07/18/2024   LABGLOB 2.6 07/10/2020   AGRATIO 1.6 07/10/2020   BILITOT 1.0 07/18/2024   ALKPHOS 90 07/18/2024   AST 20 07/18/2024   ALT 8 07/18/2024   ANIONGAP 9 07/23/2024    GFR: Estimated Creatinine Clearance: 58.2 mL/min (by C-G formula based on SCr of 0.94 mg/dL).  No results found for this or any previous visit (from the past 240 hours).    Radiology Studies: DG CHEST PORT 1 VIEW Result Date: 07/21/2024 CLINICAL DATA:  Tachypnea. EXAM: PORTABLE CHEST 1 VIEW COMPARISON:  Chest radiograph dated 12/04/2022. FINDINGS: Feeding tube extends below diaphragm with tip beyond the inferior margin of the image. Background of emphysema. No focal consolidation, pleural effusion pneumothorax. The cardiac silhouette is within limits. Median sternotomy wires. No acute osseous pathology. IMPRESSION: 1. No acute cardiopulmonary process. 2. Emphysema. Electronically Signed   By: Vanetta Chou M.D.   On: 07/21/2024 17:29      LOS: 5 days    Elgin Lam, MD [facility] 07/23/2024, 10:40 AM   If 7PM-7AM, please contact night-coverage [email or web address]  "

## 2024-07-23 NOTE — Progress Notes (Signed)
 Physical Therapy Treatment Patient Details Name: Larry Briggs MRN: [id number] DOB: [birth year] Today's Date: 07/23/2024   History of Present Illness Larry Briggs is a 88 y.o. male who presented 07/18/24 with worsening right sided weakness and inability to walk. Of note, pt recently seen in ED yesterday (12/16) where MRI of the brain and lumbar spine were negative and he was d/c'd Home. Repeat MRI showed small 9 mm posterior limb internal capsule acute infarct. 12/22 became agitated ?delirium PMHx: CAD, HTN, and HLD.    PT Comments  Patient sleeping on arrival with bil mitts on. Wife reported rough night with pt not sleeping and becoming confused. Easily aroused and able to state name and DOB, however overall followed <10% of commands with frequent eye closing. PROM to [facility] to RLE trying to incr alertness and participation with limited results. No withdrawal to pain in RLE. Wife reports MD plans to give pt something to sleep tonight and hopefully will be better tomorrow.     If plan is discharge home, recommend the following: Two people to help with walking and/or transfers;A lot of help with bathing/dressing/bathroom;Assistance with cooking/housework;Assist for transportation;Help with stairs or ramp for entrance;Supervision due to cognitive status;Direct supervision/assist for medications management   Can travel by private vehicle        Equipment Recommendations  Hospital bed;Hoyer lift;Wheelchair (measurements PT);Wheelchair cushion (measurements PT)    Recommendations for Other Services       Precautions / Restrictions Precautions Precautions: Fall Recall of Precautions/Restrictions: Impaired Precaution/Restrictions Comments: SBP < 160 Restrictions Weight Bearing Restrictions Per Provider Order: No     Mobility  Bed Mobility               General bed mobility comments: too sleepy    Transfers                        Ambulation/Gait                    Stairs             Wheelchair Mobility     Tilt Bed    Modified Rankin (Stroke Patients Only) Modified Rankin (Stroke Patients Only) Pre-Morbid Rankin Score: No symptoms Modified Rankin: Severe disability     Balance                                            Communication Communication Communication: Impaired Factors Affecting Communication: Difficulty expressing self;Reduced clarity of speech  Cognition Arousal: Stuporous Behavior During Therapy: Flat affect   PT - Cognitive impairments: Difficult to assess Difficult to assess due to: Level of arousal                     PT - Cognition Comments: able to state name and DOB when awakened; frequent falling asleep; followed <10% of commands Following commands: Impaired      Cueing Cueing Techniques: Verbal cues, Gestural cues, Tactile cues, Visual cues  Exercises Other Exercises Other Exercises: PROM to [facility] RLE    General Comments General comments (skin integrity, edema, etc.): Wife present. Reports pt was awake and agitated all night and has slept most of today.      Pertinent Vitals/Pain Pain Assessment Pain Assessment: Faces Faces Pain Scale: No hurt    Home Living Family/patient expects to be discharged to::  Inpatient rehab Living Arrangements: Spouse/significant other                      Prior Function            PT Goals (current goals can now be found in the care plan section) Acute Rehab PT Goals Time For Goal Achievement: 08/03/24 Potential to Achieve Goals: Good Progress towards PT goals: Not progressing toward goals - comment    Frequency    Min 3X/week      PT Plan      Co-evaluation              AM-PAC PT 6 Clicks Mobility   Outcome Measure  Help needed turning from your back to your side while in a flat bed without using bedrails?: Total Help needed moving from lying on your back to sitting on the side of a flat bed  without using bedrails?: Total Help needed moving to and from a bed to a chair (including a wheelchair)?: Total Help needed standing up from a chair using your arms (e.g., wheelchair or bedside chair)?: Total Help needed to walk in hospital room?: Total Help needed climbing 3-5 steps with a railing? : Total 6 Click Score: 6    End of Session   Activity Tolerance: Patient limited by lethargy Patient left: in bed;with call bell/phone within reach;with bed alarm set;with family/visitor present;with SCD's reapplied;with restraints reapplied (with mittens reapplied) Nurse Communication: Other (comment) (pt too sleepy to attempt EOB) PT Visit Diagnosis: Hemiplegia and hemiparesis;Other symptoms and signs involving the nervous system (R29.898);Other abnormalities of gait and mobility (R26.89);Unsteadiness on feet (R26.81) Hemiplegia - Right/Left: Right Hemiplegia - dominant/non-dominant: Dominant Hemiplegia - caused by: Cerebral infarction     Time: 8478-8456 PT Time Calculation (min) (ACUTE ONLY): 22 min  Charges:    $Neuromuscular Re-education: 8-22 mins PT General Charges $$ ACUTE PT VISIT: 1 Visit                      Macario RAMAN, PT Acute Rehabilitation Services  Office [phone]    Macario SHAUNNA Soja 07/23/2024, 3:55 PM

## 2024-07-23 NOTE — Plan of Care (Signed)
" °  Problem: Education: Goal: Knowledge of disease or condition will improve Outcome: Progressing   Problem: Nutrition: Goal: Risk of aspiration will decrease Outcome: Progressing   Problem: Activity: Goal: Risk for activity intolerance will decrease Outcome: Progressing   Problem: Nutrition: Goal: Adequate nutrition will be maintained Outcome: Progressing   Problem: Coping: Goal: Level of anxiety will decrease Outcome: Progressing   Problem: Elimination: Goal: Will not experience complications related to bowel motility Outcome: Progressing Goal: Will not experience complications related to urinary retention Outcome: Progressing   "

## 2024-07-23 NOTE — Inpatient Diabetes Management (Signed)
 Inpatient Diabetes Program Recommendations  AACE/ADA: New Consensus Statement on Inpatient Glycemic Control   Target Ranges:  Prepandial:   less than 140 mg/dL      Peak postprandial:   less than 180 mg/dL (1-2 hours)      Critically ill patients:  140 - 180 mg/dL    Latest Reference Range & Units 07/22/24 08:56 07/22/24 12:35 07/22/24 16:45 07/22/24 19:35 07/22/24 23:17 07/23/24 09:51  Glucose-Capillary 70 - 99 mg/dL 819 (H) 870 (H) 867 (H) 151 (H) 188 (H) 187 (H)   Review of Glycemic Control  Diabetes history: NO Outpatient Diabetes medications: NA Current orders for Inpatient glycemic control: None; Osmolite @ 50 ml/hr  Inpatient Diabetes Program Recommendations:    Insulin : If tube feedings are continued, may want to consider ordering CBGs Q4H and Novolog  0-9 units Q4H.  Thanks, Earnie Gainer, RN, MSN, CDCES Diabetes Coordinator Inpatient Diabetes Program [phone] (Team Pager from 8am to 5pm)

## 2024-07-23 NOTE — TOC Progression Note (Signed)
 Transition of Care [facility]) - Progression Note    Patient Details  Name: Larry Briggs MRN: [id number] Date of Birth: 1935-12-30  [facility] of [facility] CM/SW Contact  Andrez JULIANNA George, RN Phone Number: 07/23/2024, 1:06 PM  Clinical Narrative:     Pt with cortrak. Current recommendations are for CIR. Pt may not be able to tolerate CIR.  IP Care management following.  Expected Discharge Plan: IP Rehab Facility Barriers to Discharge: Continued Medical Work up               Expected Discharge Plan and Services   Discharge Planning Services: CM Consult Post Acute Care Choice: IP Rehab Living arrangements for the past 2 months: Single Family Home                                       Social Drivers of Health (SDOH) Interventions SDOH Screenings   Food Insecurity: No Food Insecurity (07/22/2024)  Housing: Low Risk (07/22/2024)  Transportation Needs: No Transportation Needs (07/22/2024)  Utilities: Not At Risk (07/22/2024)  Social Connections: Unknown (07/22/2024)  Tobacco Use: Medium Risk (07/18/2024)    Readmission Risk Interventions     No data to display

## 2024-07-24 ENCOUNTER — Inpatient Hospital Stay (HOSPITAL_COMMUNITY)

## 2024-07-24 DIAGNOSIS — I639 Cerebral infarction, unspecified: Secondary | ICD-10-CM | POA: Diagnosis not present

## 2024-07-24 DIAGNOSIS — F05 Delirium due to known physiological condition: Secondary | ICD-10-CM | POA: Diagnosis not present

## 2024-07-24 DIAGNOSIS — E43 Unspecified severe protein-calorie malnutrition: Secondary | ICD-10-CM | POA: Diagnosis not present

## 2024-07-24 LAB — GLUCOSE, CAPILLARY
Glucose-Capillary: 151 mg/dL — ABNORMAL HIGH (ref 70–99)
Glucose-Capillary: 156 mg/dL — ABNORMAL HIGH (ref 70–99)
Glucose-Capillary: 159 mg/dL — ABNORMAL HIGH (ref 70–99)
Glucose-Capillary: 163 mg/dL — ABNORMAL HIGH (ref 70–99)
Glucose-Capillary: 211 mg/dL — ABNORMAL HIGH (ref 70–99)
Glucose-Capillary: 220 mg/dL — ABNORMAL HIGH (ref 70–99)

## 2024-07-24 LAB — PHOSPHORUS: Phosphorus: 3.1 mg/dL (ref 2.5–4.6)

## 2024-07-24 LAB — MAGNESIUM: Magnesium: 2.1 mg/dL (ref 1.7–2.4)

## 2024-07-24 MED ORDER — QUETIAPINE FUMARATE 25 MG PO TABS
12.5000 mg | ORAL_TABLET | Freq: Every day | ORAL | Status: DC
  Administered 2024-07-24 – 2024-07-25 (×2): 12.5 mg
  Filled 2024-07-24 (×2): qty 1

## 2024-07-24 NOTE — Progress Notes (Signed)
 Speech Language Pathology Treatment: Dysphagia  Patient Details Name: Larry Briggs MRN: [id number] DOB: 10-16-1935 Today's Date: 07/24/2024 Time: 8751-8674 SLP Time Calculation (min) (ACUTE ONLY): 37 min  Assessment / Plan / Recommendation Clinical Impression  Treatment focused on dysphagia with wife at bedside who states she has been performing oral care. Pt alert and participated in po trials and therapeutic exercise. He performed approximately 15 repetitions using EMST device (expiratory muscle strength training) to facilitate respiratory and pharyngeal musculature achieved 35 cm H20. Effortful swallow and Masako maneuver (target pharyngeal and tongue base contraction) were challenging and some mobility of larynx detected with palpation but suspect full swallow not achieved despite ice chips to assist with moisture, cues to press tongue to hard palate and throat clear prior to swallow. Reflexive coughing noted with ice chips and small tsp sips water . His volitional cough is surprisingly adequate. Provided wife with written handout and requested/encouraged her to practice with pt 2-3 times a day. She voiced understanding. Plan is to repeat MBS Fri 12/26.    HPI HPI: Larry Briggs is a 88 y.o. male from home with weakness on the right side with difficulty ambulating. MRI  Acute/subacute nonhemorrhagic 9 mm infarct in the posterior limb of the left internal capsule with associated T2 and FLAIR hyperintensities. PMH: CVA 2025 no dysphagia per wife, CAD, hypertension, hyperlipidemia      SLP Plan  Continue with current plan of care        Swallow Evaluation Recommendations   Recommendations: Ice chips PRN after oral care;NPO Medication Administration: Via alternative means Oral care recommendations: Oral care QID (4x/day)     Recommendations                     Oral care QID   Frequent or constant Supervision/Assistance Dysphagia, unspecified (R13.10)     Continue  with current plan of care     Dustin Olam Bull  07/24/2024, 2:10 PM

## 2024-07-24 NOTE — Progress Notes (Signed)
 Occupational Therapy Treatment Patient Details Name: Larry Briggs MRN: [id number] DOB: [birth year] Today's Date: 07/24/2024   History of present illness Larry Briggs is a 88 y.o. male who presented 07/18/24 with worsening right sided weakness and inability to walk. Of note, pt recently seen in ED yesterday (12/16) where MRI of the brain and lumbar spine were negative and he was d/c'd Home. Repeat MRI showed small 9 mm posterior limb internal capsule acute infarct. 12/22 became agitated ?delirium PMHx: CAD, HTN, and HLD.   OT comments  Patient received in supine, lethargic, and wife present. Patient requiring max assist +2 to sit on EOB and mod assist for sitting balance. Patient performed 3 stand from EOB to [facility] with max assist +2 and limited standing tolerance. Patient stood for on second attempt to allow for peri area bathing. Patient was max assist +2 to return to supine and positioned for comfort.  Patient will benefit from intensive inpatient follow-up therapy, >3 hours/day.  Acute OT to continue to follow to address established goals to facilitate DC to next venue of care.        If plan is discharge home, recommend the following:  Two people to help with walking and/or transfers;Two people to help with bathing/dressing/bathroom;Assistance with feeding;Direct supervision/assist for medications management;Direct supervision/assist for financial management;Assist for transportation;Help with stairs or ramp for entrance;Supervision due to cognitive status   Equipment Recommendations  Other (comment) (defer)    Recommendations for Other Services      Precautions / Restrictions Precautions Precautions: Fall Precaution/Restrictions Comments: SBP < 160 Restrictions Weight Bearing Restrictions Per Provider Order: No       Mobility Bed Mobility Overal bed mobility: Needs Assistance Bed Mobility: Rolling, Supine to Sit, Sit to Supine Rolling: Mod assist, +2 for physical  assistance, Used rails   Supine to sit: Mod assist, +2 for physical assistance, HOB elevated, Used rails Sit to supine: Mod assist, +2 for physical assistance   General bed mobility comments: very lethargic and required hand over hand cues for initiation    Transfers Overall transfer level: Needs assistance Equipment used: Ambulation equipment used Transfers: Sit to/from Stand Sit to Stand: Max assist, +2 physical assistance, +2 safety/equipment, From elevated surface, Via lift equipment           General transfer comment: patient attempting to assist with pulling up on Stedy with max assist +2 for hips and balance Transfer via Lift Equipment: Stedy   Balance Overall balance assessment: Needs assistance Sitting-balance support: Bilateral upper extremity supported, Feet supported Sitting balance-Leahy Scale: Poor Sitting balance - Comments: required mod A for static sitting balance due to poor trunk control and posterior bias Postural control: Posterior lean, Right lateral lean Standing balance support: Bilateral upper extremity supported, During functional activity, Reliant on assistive device for balance Standing balance-Leahy Scale: Poor Standing balance comment: reliant on Stedy for support                           ADL either performed or assessed with clinical judgement   ADL Overall ADL's : Needs assistance/impaired     Grooming: Wash/dry face;Minimal assistance;Sitting Grooming Details (indicate cue type and reason): on EOB     Lower Body Bathing: Total assistance;Sit to/from stand Lower Body Bathing Details (indicate cue type and reason): stood in [place] to perform peri area back cleaning  Extremity/Trunk Assessment              Occupational Psychologist Communication: Impaired Factors Affecting Communication: Difficulty expressing self;Reduced clarity of speech    Cognition Arousal: Lethargic Behavior During Therapy: Flat affect Cognition: Cognition impaired   Orientation impairments: Place, Time, Situation Awareness: Intellectual awareness impaired Memory impairment (select all impairments): Short-term memory, Working memory Attention impairment (select first level of impairment): Focused attention Executive functioning impairment (select all impairments): Initiation, Organization, Sequencing, Reasoning, Problem solving OT - Cognition Comments: lethargic but more alert once on EOB                 Following commands: Impaired Following commands impaired: Follows one step commands inconsistently, Follows one step commands with increased time      Cueing   Cueing Techniques: Verbal cues, Tactile cues  Exercises      Shoulder Instructions       General Comments wife present and supportive    Pertinent Vitals/ Pain       Pain Assessment Pain Assessment: Faces Faces Pain Scale: Hurts a little bit Pain Location: low back Pain Descriptors / Indicators: Grimacing, Moaning Pain Intervention(s): Limited activity within patient's tolerance, Monitored during session, Repositioned  Home Living                                          Prior Functioning/Environment              Frequency  Min 2X/week        Progress Toward Goals  OT Goals(current goals can now be found in the care plan section)  Progress towards OT goals: Progressing toward goals  Acute Rehab OT Goals Patient Stated Goal: get better OT Goal Formulation: With family Time For Goal Achievement: 08/03/24 Potential to Achieve Goals: Good ADL Goals Pt Will Perform Eating: with contact guard assist;sitting Pt Will Perform Grooming: with contact guard assist;sitting Pt Will Perform Upper Body Dressing: with min assist;sitting Pt Will Perform Lower Body Dressing: with mod assist;sitting/lateral leans Additional ADL Goal #1: Pt will sit EOB  with no more than CGA in prepration for seated ADLs.  Plan      Co-evaluation    PT/OT/SLP Co-Evaluation/Treatment: Yes Reason for Co-Treatment: Complexity of the patient's impairments (multi-system involvement);Necessary to address cognition/behavior during functional activity;For patient/therapist safety;To address functional/ADL transfers   OT goals addressed during session: ADL's and self-care      AM-PAC OT 6 Clicks Daily Activity     Outcome Measure   Help from another person eating meals?: A Lot Help from another person taking care of personal grooming?: A Lot Help from another person toileting, which includes using toliet, bedpan, or urinal?: Total Help from another person bathing (including washing, rinsing, drying)?: A Lot Help from another person to put on and taking off regular upper body clothing?: A Lot Help from another person to put on and taking off regular lower body clothing?: Total 6 Click Score: 10    End of Session Equipment Utilized During Treatment: Gait belt;Other (comment) Laurent)  OT Visit Diagnosis: Unsteadiness on feet (R26.81);Repeated falls (R29.6);Muscle weakness (generalized) (M62.81);Cognitive communication deficit (R41.841) Symptoms and signs involving cognitive functions: Cerebral infarction   Activity Tolerance Patient tolerated treatment well   Patient Left in bed;with call bell/phone within reach;with bed alarm  set;with restraints reapplied;with family/visitor present   Nurse Communication Mobility status        Time: [phone] OT Time Calculation (min): 23 min  Charges: OT General Charges $OT Visit: 1 Visit OT Treatments $Therapeutic Activity: 8-22 mins  Dick Briggs, OTA Acute Rehabilitation Services  Office [phone]   Larry Briggs 07/24/2024, 1:05 PM

## 2024-07-24 NOTE — Progress Notes (Signed)
 Physical Therapy Treatment Patient Details Name: Larry Briggs MRN: [id number] DOB: 05-04-1936 Today's Date: 07/24/2024   History of Present Illness Larry Briggs is a 88 y.o. male who presented 07/18/24 with worsening right sided weakness and inability to walk. Of note, pt recently seen in ED yesterday (12/16) where MRI of the brain and lumbar spine were negative and he was d/c'd Home. Repeat MRI showed small 9 mm posterior limb internal capsule acute infarct. 12/22 became agitated ?delirium PMHx: CAD, HTN, and HLD.    PT Comments  Pt seen for co-treatment with OT. Received pt semi-reclined in bed with wife at bedside. Pt remains lethargic and required max cues to keep eyes open during session. Pt required max A +2 for bed mobility using bed features and mod A for static sitting balance. Pt stood x 3 attempts in [place] with max A +2 (trial 1 pt unable to stand completley upright due to low sitting bed). While standing wife assisted with posterior pericare and straighten out chuck pads. Pt limited by fatigue and requested to lie down - rolled L/R with max A to reposition chuck pads. Currently recommend continued therapy <3 hours/day to address current deficits, however pending progress and alertness, pt may be candidate for >3 hours/day. Acute PT to cont to follow.    If plan is discharge home, recommend the following: Two people to help with walking and/or transfers;Assistance with cooking/housework;Assist for transportation;Help with stairs or ramp for entrance;Supervision due to cognitive status;Direct supervision/assist for medications management;Two people to help with bathing/dressing/bathroom   Can travel by private vehicle     No  Equipment Recommendations  Other (comment) (TBD in next venue)    Recommendations for Other Services       Precautions / Restrictions Precautions Precautions: Fall Precaution/Restrictions Comments: SBP < 160 Restrictions Weight Bearing Restrictions  Per Provider Order: No     Mobility  Bed Mobility Overal bed mobility: Needs Assistance Bed Mobility: Rolling, Supine to Sit, Sit to Supine Rolling: Mod assist, +2 for physical assistance, Used rails   Supine to sit: Mod assist, +2 for physical assistance, HOB elevated, Used rails Sit to supine: Mod assist, +2 for physical assistance   General bed mobility comments: very lethargic and required hand over hand cues for initiation Patient Response: Flat affect  Transfers Overall transfer level: Needs assistance   Transfers: Sit to/from Stand Sit to Stand: Max assist, +2 physical assistance, +2 safety/equipment, From elevated surface, Via lift equipment           General transfer comment: Attempted x 1 from low sitting EOB in [place] with heavy max A +2 but unable to come upright. Stood additional x2 trials from elevated EOB with Stedy and max A +2 to scoot to [facility]. max cues provided for pt to keep eyes open Transfer via Lift Equipment: Stedy  Ambulation/Gait               General Gait Details: Unable at this time.   Stairs             Wheelchair Mobility     Tilt Bed Tilt Bed Patient Response: Flat affect  Modified Rankin (Stroke Patients Only) Modified Rankin (Stroke Patients Only) Pre-Morbid Rankin Score: No symptoms Modified Rankin: Severe disability     Balance Overall balance assessment: Needs assistance Sitting-balance support: Bilateral upper extremity supported, Feet supported Sitting balance-Leahy Scale: Poor Sitting balance - Comments: required mod A for static sitting balance due to poor trunk control and posterior bias Postural  control: Posterior lean, Right lateral lean Standing balance support: Bilateral upper extremity supported, During functional activity, Reliant on assistive device for balance (Stedy) Standing balance-Leahy Scale: Poor Standing balance comment: +2 assist and use of Stedy for static standing balance                             Communication Communication Communication: Impaired Factors Affecting Communication: Difficulty expressing self;Reduced clarity of speech  Cognition Arousal: Lethargic Behavior During Therapy: Flat affect   PT - Cognitive impairments: Difficult to assess Difficult to assess due to: Level of arousal                     PT - Cognition Comments: kept eyes closed throughout majority of session Following commands: Impaired Following commands impaired: Follows one step commands inconsistently, Follows one step commands with increased time    Cueing Cueing Techniques: Verbal cues, Tactile cues  Exercises      General Comments General comments (skin integrity, edema, etc.): Wife present during session. Pt continues to have mittens on but was not agitated or restless during session      Pertinent Vitals/Pain Pain Assessment Pain Assessment: Faces Faces Pain Scale: Hurts a little bit Pain Location: low back Pain Descriptors / Indicators: Grimacing, Moaning Pain Intervention(s): Limited activity within patient's tolerance, Monitored during session, Repositioned    Home Living                          Prior Function            PT Goals (current goals can now be found in the care plan section) Acute Rehab PT Goals Patient Stated Goal: Son reports for pt to regain his mobility PT Goal Formulation: With family Time For Goal Achievement: 08/03/24 Potential to Achieve Goals: Fair Progress towards PT goals: Progressing toward goals    Frequency    Min 3X/week      PT Plan      Co-evaluation              AM-PAC PT 6 Clicks Mobility   Outcome Measure  Help needed turning from your back to your side while in a flat bed without using bedrails?: Total Help needed moving from lying on your back to sitting on the side of a flat bed without using bedrails?: Total Help needed moving to and from a bed to a chair (including a wheelchair)?:  Total Help needed standing up from a chair using your arms (e.g., wheelchair or bedside chair)?: Total Help needed to walk in hospital room?: Total Help needed climbing 3-5 steps with a railing? : Total 6 Click Score: 6    End of Session Equipment Utilized During Treatment: Gait belt Activity Tolerance: Patient limited by lethargy Patient left: in bed;with call bell/phone within reach;with bed alarm set;with family/visitor present;with restraints reapplied Nurse Communication: Mobility status;Need for lift equipment PT Visit Diagnosis: Hemiplegia and hemiparesis;Other symptoms and signs involving the nervous system (R29.898);Other abnormalities of gait and mobility (R26.89);Unsteadiness on feet (R26.81) Hemiplegia - Right/Left: Right Hemiplegia - dominant/non-dominant: Dominant Hemiplegia - caused by: Cerebral infarction     Time: 9149-9085 PT Time Calculation (min) (ACUTE ONLY): 24 min  Charges:    $Neuromuscular Re-education: 8-22 mins PT General Charges $$ ACUTE PT VISIT: 1 Visit                     Therisa  Zaunegger PT, DPT Therisa HERO Zaunegger 07/24/2024, 9:26 AM

## 2024-07-24 NOTE — Plan of Care (Signed)
  Problem: Nutrition: Goal: Risk of aspiration will decrease Outcome: Progressing Goal: Dietary intake will improve Outcome: Progressing   Problem: Clinical Measurements: Goal: Ability to maintain clinical measurements within normal limits will improve Outcome: Progressing Goal: Will remain free from infection Outcome: Progressing Goal: Diagnostic test results will improve Outcome: Progressing Goal: Respiratory complications will improve Outcome: Progressing Goal: Cardiovascular complication will be avoided Outcome: Progressing   Problem: Elimination: Goal: Will not experience complications related to bowel motility Outcome: Progressing Goal: Will not experience complications related to urinary retention Outcome: Progressing   Problem: Pain Managment: Goal: General experience of comfort will improve and/or be controlled Outcome: Progressing   Problem: Safety: Goal: Ability to remain free from injury will improve Outcome: Progressing   Problem: Skin Integrity: Goal: Risk for impaired skin integrity will decrease Outcome: Progressing

## 2024-07-24 NOTE — Progress Notes (Signed)
 "  PROGRESS NOTE    Larry Briggs  FMW:[id number] DOB: 08-Aug-1935 DOA: 07/18/2024 PCP: [name], MD   Brief Narrative: Larry Briggs is a 88 y.o. male with a history of CAD, hypertension, hyperlipidemia.  Patient presented secondary to extremity weakness and was found to have an acute stroke. Hospitalization complicated by dysphagia, from stroke, requiring cortrak placement and tube feeding.   Assessment and Plan:  Acute stroke Patient presented with symptoms including . MRI brain (12/17) confirms acute/subacute nonhemorrhagic 9 mm infarct in the posterior limb of the left internal capsule. MRA head significant for no large vessel occlusion or high grade stenosis. LDL of 39. Hemoglobin A1C of 5.4%. Transthoracic Echocardiogram significant for LVEF of 60-65% with indeterminate diastolic parameters and  no atrial level shunt. Neurology recommendations for continuing Xarelto . PT/OT recommendations for acute inpatient rehabilitation. Aspirin  discontinued per neurology recommendations. -Continue Xarelto   Agitation Confusion Possibly related to stroke vs hospital delirium. Improved after Geodon , but now has recurrent symptoms. Improvement with Seroquel . Somewhat somnolent this morning. -Decrease to Seroquel  12.5 mg at night per tube while inpatient  Primary hypertension Patient is on amlodipine  and metoprolol  succinate as an outpatient. Metoprolol  held on admission but restarted; amlodipine  held. -Continue metoprolol  tartrate  CAD Noted. No chest pain.  Paroxysmal atrial fibrillation Noted on EKG. Patient has a known history of atrial fibrillation. Patient also with frequent PVCs. Echo this admission with normal LVEF and normal atria. Patient follows with Dr. Jordan as an outpatient. He is on Xarelto  and Toprol  XL as an outpatient; Toprol  XL not resumed on admission. -Continue Xarelto  -Continue metoprolol  tartrate 12.5 mg BID while requiring a feeding tube; transition back to  metoprolol  succinate once able to take PO  Ileus Noted on abdominal x-ray. Patient without abdominal pain, nausea or vomiting. Patient is having bowel movements. -Watch for development of worsening symptoms  Dysphagia Related to acute stroke. Patient seen and evaluated by speech therapy with recommendation for NPO. Cortrak NG tube placed on 12/19 and tube feeds started. -SLP recommendations (12/19): NPO -Dietitian recommendations (12/19): Initiate tube feeding via Cortrak: initiate at 20 ml/h and increase 10 ml q10h until goal rate is reached Osmolite at 50 ml/h (1200 ml per day) Prosource TF20 60 ml daily Provides 1880 kcal, 95 gm protein, 914 ml free water  daily FWF: 150 ml q6h (total free water : 1514 ml daily)   Monitor magnesium  and phosphorus daily x 4 occurrences, MD to replete as needed, as pt is at risk for refeeding syndrome given severe malnutrition. Daily thiamine  100 mg x 7 days  Severe malnutrition Tube feeds as mentioned above.  Pericardial effusion Noted on echo. Noted as small in volume with evidence of diastolic collapse of the right ventricular free wall without evidence of cardiac tamponade.   DVT prophylaxis: Xarelto  Code Status:   Code Status: Full Code Family Communication: Wife at bedside.  Disposition Plan: Discharge possibly to acute inpatient rehabilitation vs SNF. If discharge to SNF, will need to have NG tube removed vs transition to PEG tube   Consultants:  Neurology  Procedures:  Transthoracic Echocardiogram  Antimicrobials: None    Subjective: Patient reports no concerns this morning. Feels well. No abdominal pain.  Objective: BP (!) 116/43 (BP Location: Right Leg)   Pulse 62   Temp 99.6 F (37.6 C) (Oral)   Resp 20   Ht 5' 10 (1.778 m)   Wt 72.5 kg   SpO2 96%   BMI 22.93 kg/m   Examination:  General exam: Appears  calm and comfortable. Respiratory system: Clear to auscultation. Respiratory effort normal. Cardiovascular  system: S1 & S2 heard, irregular rhythm, normal rate.  Gastrointestinal system: Abdomen is mildly distended, soft and nontender. Hyperactive bowel sounds heard. Central nervous system: Alert and oriented. No focal neurological deficits.   Data Reviewed: I have personally reviewed following labs and imaging studies  CBC Lab Results  Component Value Date   WBC 11.2 (H) 07/21/2024   RBC 3.45 (L) 07/21/2024   HGB 9.9 (L) 07/21/2024   HCT 30.0 (L) 07/21/2024   MCV 87.0 07/21/2024   MCH 28.7 07/21/2024   PLT 298 07/21/2024   MCHC 33.0 07/21/2024   RDW 14.1 07/21/2024   LYMPHSABS 0.5 (L) 07/18/2024   MONOABS 0.6 07/18/2024   EOSABS 0.0 07/18/2024   BASOSABS 0.0 07/18/2024     Last metabolic panel Lab Results  Component Value Date   NA 139 07/23/2024   K 3.7 07/23/2024   CL 101 07/23/2024   CO2 28 07/23/2024   BUN 36 (H) 07/23/2024   CREATININE 0.94 07/23/2024   GLUCOSE 186 (H) 07/23/2024   GFRNONAA >60 07/23/2024   GFRAA 77 07/10/2020   CALCIUM  8.3 (L) 07/23/2024   PHOS 3.1 07/24/2024   PROT 6.3 (L) 07/18/2024   ALBUMIN  4.1 07/18/2024   LABGLOB 2.6 07/10/2020   AGRATIO 1.6 07/10/2020   BILITOT 1.0 07/18/2024   ALKPHOS 90 07/18/2024   AST 20 07/18/2024   ALT 8 07/18/2024   ANIONGAP 9 07/23/2024    GFR: Estimated Creatinine Clearance: 57.8 mL/min (by C-G formula based on SCr of 0.94 mg/dL).  No results found for this or any previous visit (from the past 240 hours).    Radiology Studies: DG Abd Portable 1V Result Date: 07/24/2024 EXAM: 1 VIEW XRAY OF THE ABDOMEN 07/24/2024 09:54:00 AM COMPARISON: None available. CLINICAL HISTORY: 88 year old male. Abdominal distension. FINDINGS: LINES, TUBES AND DEVICES: Enteric feeding tube in place with tip in the right upper quadrant, could be in the gastric antrum or proximal duodenum. Advanced several centimeters to ensure postpyloric placement. BOWEL: Gas-filled dilation of loops of large bowel in the abdomen up to 7 cm.  Combination of gas and barium-type oral contrast throughout the large bowel to the rectum. Severe sigmoid diverticulosis is evident. No transition point. Constellation favors ileus. SOFT TISSUES: Surgical clips in upper abdomen. Cholecystectomy clips. Vascular calcifications. BONES: Partially imaged left femoral intramedullary rod. Median sternotomy noted. No acute fracture. IMPRESSION: 1. Mildly distended large bowel containing combined gas and oral contrast to the rectum. Constellation favors ileus. 2. Enteric tube tip in RUQ; advance several cm if post-pyloric placement desired. Electronically signed by: Helayne Hurst MD 07/24/2024 11:16 AM EST RP Workstation: [id number]      LOS: 6 days    Elgin Lam, MD [facility] 07/24/2024, 12:06 PM   If 7PM-7AM, please contact night-coverage [email or web address]  "

## 2024-07-25 DIAGNOSIS — E43 Unspecified severe protein-calorie malnutrition: Secondary | ICD-10-CM | POA: Diagnosis not present

## 2024-07-25 DIAGNOSIS — I639 Cerebral infarction, unspecified: Secondary | ICD-10-CM | POA: Diagnosis not present

## 2024-07-25 DIAGNOSIS — R531 Weakness: Secondary | ICD-10-CM

## 2024-07-25 LAB — GLUCOSE, CAPILLARY
Glucose-Capillary: 186 mg/dL — ABNORMAL HIGH (ref 70–99)
Glucose-Capillary: 165 mg/dL — ABNORMAL HIGH (ref 70–99)
Glucose-Capillary: 198 mg/dL — ABNORMAL HIGH (ref 70–99)
Glucose-Capillary: 180 mg/dL — ABNORMAL HIGH (ref 70–99)
Glucose-Capillary: 196 mg/dL — ABNORMAL HIGH (ref 70–99)

## 2024-07-25 MED ORDER — HALOPERIDOL LACTATE 5 MG/ML IJ SOLN
1.0000 mg | Freq: Four times a day (QID) | INTRAMUSCULAR | Status: AC | PRN
  Administered 2024-07-25 – 2024-07-29 (×2): 1 mg via INTRAVENOUS
  Filled 2024-07-25 (×2): qty 1

## 2024-07-25 NOTE — Plan of Care (Signed)
" °  Problem: Education: Goal: Knowledge of disease or condition will improve Outcome: Not Progressing Goal: Knowledge of secondary prevention will improve (MUST DOCUMENT ALL) Outcome: Not Progressing Goal: Knowledge of patient specific risk factors will improve (DELETE if not current risk factor) Outcome: Not Progressing   Problem: Ischemic Stroke/TIA Tissue Perfusion: Goal: Complications of ischemic stroke/TIA will be minimized Outcome: Not Progressing   Problem: Coping: Goal: Will verbalize positive feelings about self Outcome: Not Progressing Goal: Will identify appropriate support needs Outcome: Not Progressing   Problem: Nutrition: Goal: Risk of aspiration will decrease Outcome: Not Progressing Goal: Dietary intake will improve Outcome: Not Progressing   Problem: Education: Goal: Knowledge of General Education information will improve Description: Including pain rating scale, medication(s)/side effects and non-pharmacologic comfort measures Outcome: Not Progressing   Problem: Activity: Goal: Risk for activity intolerance will decrease Outcome: Not Progressing   "

## 2024-07-25 NOTE — Progress Notes (Signed)
" ° °  Inpatient Rehabilitation Admissions Coordinator   Patient not a candidate for Cir. Other rehab venues should be pursued.  Heron Leavell, RN, MSN Rehab Admissions Coordinator [phone] 07/25/2024 5:01 PM  "

## 2024-07-25 NOTE — Plan of Care (Signed)
  Problem: Education: Goal: Knowledge of disease or condition will improve Outcome: Not Progressing Goal: Knowledge of secondary prevention will improve (MUST DOCUMENT ALL) Outcome: Not Progressing Goal: Knowledge of patient specific risk factors will improve (DELETE if not current risk factor) Outcome: Not Progressing   Problem: Ischemic Stroke/TIA Tissue Perfusion: Goal: Complications of ischemic stroke/TIA will be minimized Outcome: Not Progressing   Problem: Coping: Goal: Will verbalize positive feelings about self Outcome: Not Progressing Goal: Will identify appropriate support needs Outcome: Not Progressing   Problem: Health Behavior/Discharge Planning: Goal: Ability to manage health-related needs will improve Outcome: Not Progressing Goal: Goals will be collaboratively established with patient/family Outcome: Not Progressing

## 2024-07-25 NOTE — Progress Notes (Signed)
 "        [facility]                                                                               Jarom Govan, is a 88 y.o. male, DOB - 1935-09-02, FMW:[id number] Admit date - 07/18/2024    Outpatient Primary MD for the patient is [name], MD  LOS - 7  days    Brief summary   Larry Briggs is a 88 y.o. male with a history of CAD, hypertension, hyperlipidemia.  Patient presented secondary to extremity weakness and was found to have an acute stroke. Hospitalization complicated by dysphagia, from stroke, requiring cortrak placement and tube feeding.   Assessment & Plan    Assessment and Plan:   Acute stroke Patient presented with symptoms including . MRI brain (12/17) confirms acute/subacute nonhemorrhagic 9 mm infarct in the posterior limb of the left internal capsule. MRA head significant for no large vessel occlusion or high grade stenosis. LDL of 39. Hemoglobin A1C of 5.4%. Transthoracic Echocardiogram significant for LVEF of 60-65% with indeterminate diastolic parameters and  no atrial level shunt. Neurology recommendations for continuing Xarelto . PT/OT recommendations for acute inpatient rehabilitation. Aspirin  discontinued per neurology recommendations. -Continue Xarelto    Agitation Confusion Possibly related to stroke vs hospital delirium. Improved after Geodon , but now has recurrent symptoms. Improvement with Seroquel . Somewhat somnolent this morning. -Decrease to Seroquel  12.5 mg at night per tube while inpatient. Patient is more irritable this morning, wants to get out of the hospital.    Primary hypertension Well controlled. Continue with metoprolol  12.5 mg BID.    CAD Noted. No chest pain.   Paroxysmal atrial fibrillation Noted on EKG. Patient has a known history of atrial fibrillation. Patient also with frequent PVCs. Echo this admission with normal LVEF and normal atria. Patient follows with Dr. Jordan as an outpatient. He is on Xarelto  and  Toprol  XL as an outpatient; Toprol  XL not resumed on admission. -Continue Xarelto  -Continue metoprolol  tartrate 12.5 mg BID while requiring a feeding tube; transition back to metoprolol  succinate once able to take PO   Ileus Noted on abdominal x-ray. Patient without abdominal pain, nausea or vomiting. Patient is having bowel movements. -no abdominal pain. Had a BM yesterday.    Dysphagia Related to acute stroke. Patient seen and evaluated by speech therapy with recommendation for NPO. Cortrak NG tube placed on 12/19 and tube feeds started. -SLP recommendations (12/19): NPO -Dietitian recommendations (12/19): Initiate tube feeding via Cortrak: initiate at 20 ml/h and increase 10 ml q10h until goal rate is reached Osmolite at 50 ml/h (1200 ml per day) Prosource TF20 60 ml daily Provides 1880 kcal, 95 gm protein, 914 ml free water  daily FWF: 150 ml q6h (total free water : 1514 ml daily)   Monitor magnesium  and phosphorus daily x 4 occurrences, MD to replete as needed, as pt is at risk for refeeding syndrome given severe malnutrition. Daily thiamine  100 mg x 7 days   Plan for SLP eval on Friday , plan for PEG as patient's wife wants artificial feeding.  They also want to talk to palliative care.   Severe malnutrition Tube feeds as mentioned above.   Pericardial  effusion Noted on echo. Noted as small in volume with evidence of diastolic collapse of the right ventricular free wall without evidence of cardiac tamponade.   Anemia of chronic disease: Hemoglobin around 10 .    RN Pressure Injury Documentation:    Malnutrition Type:  Nutrition Problem: Severe Malnutrition Etiology: social / environmental circumstances (functional decline related to aging)   Malnutrition Characteristics:  Signs/Symptoms: severe fat depletion, severe muscle depletion   Nutrition Interventions:  Interventions: Tube feeding  Estimated body mass index is 23.22 kg/m as calculated from the  following:   Height as of this encounter: 5' 10 (1.778 m).   Weight as of this encounter: 73.4 kg.  Code Status: full code.  DVT Prophylaxis:  SCD's Start: 07/18/24 2339 Rivaroxaban  (XARELTO ) tablet 15 mg   Level of Care: Level of care: Med-Surg Family Communication: discussed with wife in bedside.  Disposition Plan:     Remains inpatient appropriate:  pending.   Procedures:  None.   Consultants:   SLP Neurology.   Antimicrobials:   Anti-infectives (From admission, onward)    None        Medications  Scheduled Meds:   stroke: early stages of recovery book   Does not apply Once   Chlorhexidine  Gluconate Cloth  6 each Topical Daily   cyanocobalamin   1,000 mcg Per Tube Daily   feeding supplement (PROSource TF20)  60 mL Per Tube Daily   free water   150 mL Per Tube Q6H   metoprolol  tartrate  12.5 mg Per Tube BID   Polyethyl Glycol-Propyl Glycol  1 drop Both Eyes BID   QUEtiapine   12.5 mg Per Tube QHS   rivaroxaban   15 mg Per Tube Q supper   simvastatin   40 mg Per Tube q1800   thiamine   100 mg Per Tube Daily   Continuous Infusions:  feeding supplement (OSMOLITE 1.5 CAL) 1,000 mL (07/25/24 1041)   PRN Meds:.acetaminophen  **OR** acetaminophen  (TYLENOL ) oral liquid 160 mg/5 mL **OR** acetaminophen , ondansetron  (ZOFRAN ) IV    Subjective:   Larry Briggs was seen and examined today.  Pt more irritable today, he is trying to have a BM.   Objective:   Vitals:   07/25/24 0338 07/25/24 0500 07/25/24 0822 07/25/24 1134  BP: 119/87  126/76 (!) 147/80  Pulse: 88  (!) 55 93  Resp: 18  16 19   Temp:   98.8 F (37.1 C) 98.7 F (37.1 C)  TempSrc:   Oral Oral  SpO2: 98%  97% 96%  Weight:  73.4 kg    Height:        Intake/Output Summary (Last 24 hours) at 07/25/2024 1449 Last data filed at 07/25/2024 1154 Gross per 24 hour  Intake 900 ml  Output 650 ml  Net 250 ml   Filed Weights   07/23/24 1300 07/24/24 0556 07/25/24 0500  Weight: 74.8 kg 72.5 kg 73.4 kg      Exam General exam: Appears calm and comfortable  Respiratory system: Clear to auscultation. Respiratory effort normal. Cardiovascular system: S1 & S2 heard, IRREGULARLY irregular.  Gastrointestinal system: Abdomen is nondistended, soft and nontender.  Central nervous system: Alert and oriented.  Extremities: Symmetric 5 x 5 power. Skin: No rashes,  Psychiatry:  Mood & affect appropriate.     Data Reviewed:  I have personally reviewed following labs and imaging studies   CBC Lab Results  Component Value Date   WBC 11.2 (H) 07/21/2024   RBC 3.45 (L) 07/21/2024   HGB 9.9 (L) 07/21/2024  HCT 30.0 (L) 07/21/2024   MCV 87.0 07/21/2024   MCH 28.7 07/21/2024   PLT 298 07/21/2024   MCHC 33.0 07/21/2024   RDW 14.1 07/21/2024   LYMPHSABS 0.5 (L) 07/18/2024   MONOABS 0.6 07/18/2024   EOSABS 0.0 07/18/2024   BASOSABS 0.0 07/18/2024     Last metabolic panel Lab Results  Component Value Date   NA 139 07/23/2024   K 3.7 07/23/2024   CL 101 07/23/2024   CO2 28 07/23/2024   BUN 36 (H) 07/23/2024   CREATININE 0.94 07/23/2024   GLUCOSE 186 (H) 07/23/2024   GFRNONAA >60 07/23/2024   GFRAA 77 07/10/2020   CALCIUM  8.3 (L) 07/23/2024   PHOS 3.1 07/24/2024   PROT 6.3 (L) 07/18/2024   ALBUMIN  4.1 07/18/2024   LABGLOB 2.6 07/10/2020   AGRATIO 1.6 07/10/2020   BILITOT 1.0 07/18/2024   ALKPHOS 90 07/18/2024   AST 20 07/18/2024   ALT 8 07/18/2024   ANIONGAP 9 07/23/2024    CBG (last 3)  Recent Labs    07/24/24 2200 07/25/24 0824 07/25/24 1135  GLUCAP 156* 186* 165*      Coagulation Profile: No results for input(s): INR, PROTIME in the last 168 hours.   Radiology Studies: DG Abd Portable 1V Result Date: 07/24/2024 EXAM: 1 VIEW XRAY OF THE ABDOMEN 07/24/2024 09:54:00 AM COMPARISON: None available. CLINICAL HISTORY: 88 year old male. Abdominal distension. FINDINGS: LINES, TUBES AND DEVICES: Enteric feeding tube in place with tip in the right upper quadrant,  could be in the gastric antrum or proximal duodenum. Advanced several centimeters to ensure postpyloric placement. BOWEL: Gas-filled dilation of loops of large bowel in the abdomen up to 7 cm. Combination of gas and barium-type oral contrast throughout the large bowel to the rectum. Severe sigmoid diverticulosis is evident. No transition point. Constellation favors ileus. SOFT TISSUES: Surgical clips in upper abdomen. Cholecystectomy clips. Vascular calcifications. BONES: Partially imaged left femoral intramedullary rod. Median sternotomy noted. No acute fracture. IMPRESSION: 1. Mildly distended large bowel containing combined gas and oral contrast to the rectum. Constellation favors ileus. 2. Enteric tube tip in RUQ; advance several cm if post-pyloric placement desired. Electronically signed by: Helayne Hurst MD 07/24/2024 11:16 AM EST RP Workstation: [id number]       Elgie Butter M.D. [facility] 07/25/2024, 2:49 PM  Available via Epic secure chat 7am-7pm After 7 pm, please refer to night coverage provider listed on amion.    "

## 2024-07-25 NOTE — Plan of Care (Signed)
 " Problem: Education: Goal: Knowledge of disease or condition will improve 07/25/2024 0507 by Baldwin Doran BIRCH, RN Outcome: Not Progressing 07/25/2024 0502 by Baldwin Doran BIRCH, RN Outcome: Not Progressing Goal: Knowledge of secondary prevention will improve (MUST DOCUMENT ALL) 07/25/2024 0507 by Baldwin Doran BIRCH, RN Outcome: Not Progressing 07/25/2024 0502 by Baldwin Doran BIRCH, RN Outcome: Not Progressing Goal: Knowledge of patient specific risk factors will improve (DELETE if not current risk factor) 07/25/2024 0507 by Baldwin Doran BIRCH, RN Outcome: Not Progressing 07/25/2024 0502 by Baldwin Doran BIRCH, RN Outcome: Not Progressing   Problem: Ischemic Stroke/TIA Tissue Perfusion: Goal: Complications of ischemic stroke/TIA will be minimized 07/25/2024 0507 by Baldwin Doran BIRCH, RN Outcome: Not Progressing 07/25/2024 0502 by Baldwin Doran BIRCH, RN Outcome: Not Progressing   Problem: Coping: Goal: Will verbalize positive feelings about self 07/25/2024 0507 by Baldwin Doran BIRCH, RN Outcome: Not Progressing 07/25/2024 0502 by Baldwin Doran BIRCH, RN Outcome: Not Progressing Goal: Will identify appropriate support needs 07/25/2024 0507 by Baldwin Doran BIRCH, RN Outcome: Not Progressing 07/25/2024 0502 by Baldwin Doran BIRCH, RN Outcome: Not Progressing   Problem: Self-Care: Goal: Ability to participate in self-care as condition permits will improve 07/25/2024 0507 by Baldwin Doran BIRCH, RN Outcome: Not Progressing 07/25/2024 0502 by Baldwin Doran BIRCH, RN Outcome: Not Progressing Goal: Verbalization of feelings and concerns over difficulty with self-care will improve 07/25/2024 0507 by Baldwin Doran BIRCH, RN Outcome: Not Progressing 07/25/2024 0502 by Baldwin Doran BIRCH, RN Outcome: Not Progressing Goal: Ability to communicate needs accurately will improve 07/25/2024 0507 by Baldwin Doran BIRCH, RN Outcome: Not Progressing 07/25/2024 0502 by Baldwin Doran BIRCH,  RN Outcome: Not Progressing   Problem: Education: Goal: Knowledge of General Education information will improve Description: Including pain rating scale, medication(s)/side effects and non-pharmacologic comfort measures 07/25/2024 0507 by Baldwin Doran BIRCH, RN Outcome: Not Progressing 07/25/2024 0502 by Baldwin Doran BIRCH, RN Outcome: Not Progressing   Problem: Health Behavior/Discharge Planning: Goal: Ability to manage health-related needs will improve 07/25/2024 0507 by Baldwin Doran BIRCH, RN Outcome: Not Progressing 07/25/2024 0502 by Baldwin Doran BIRCH, RN Outcome: Not Progressing   Problem: Clinical Measurements: Goal: Ability to maintain clinical measurements within normal limits will improve 07/25/2024 0507 by Baldwin Doran BIRCH, RN Outcome: Not Progressing 07/25/2024 0502 by Baldwin Doran BIRCH, RN Outcome: Not Progressing Goal: Will remain free from infection 07/25/2024 0507 by Baldwin Doran BIRCH, RN Outcome: Not Progressing 07/25/2024 0502 by Baldwin Doran BIRCH, RN Outcome: Not Progressing Goal: Diagnostic test results will improve 07/25/2024 0507 by Baldwin Doran BIRCH, RN Outcome: Not Progressing 07/25/2024 0502 by Baldwin Doran BIRCH, RN Outcome: Not Progressing Goal: Respiratory complications will improve 07/25/2024 0507 by Baldwin Doran BIRCH, RN Outcome: Not Progressing 07/25/2024 0502 by Baldwin Doran BIRCH, RN Outcome: Not Progressing Goal: Cardiovascular complication will be avoided 07/25/2024 0507 by Baldwin Doran BIRCH, RN Outcome: Not Progressing 07/25/2024 0502 by Baldwin Doran BIRCH, RN Outcome: Not Progressing   Problem: Clinical Measurements: Goal: Diagnostic test results will improve 07/25/2024 0502 by Baldwin Doran BIRCH, RN Outcome: Not Progressing   Problem: Nutrition: Goal: Adequate nutrition will be maintained 07/25/2024 0507 by Baldwin Doran BIRCH, RN Outcome: Not Progressing 07/25/2024 0502 by Baldwin Doran BIRCH, RN Outcome: Not  Progressing   Problem: Pain Managment: Goal: General experience of comfort will improve and/or be controlled 07/25/2024 0507 by Baldwin Doran BIRCH, RN Outcome: Not Progressing 07/25/2024 0502 by Baldwin Doran BIRCH, RN Outcome: Not Progressing   Problem: Safety: Goal: Ability to remain free from injury will improve  07/25/2024 0507 by Baldwin Doran BIRCH, RN Outcome: Not Progressing 07/25/2024 0502 by Baldwin Doran BIRCH, RN Outcome: Not Progressing   "

## 2024-07-26 DIAGNOSIS — I639 Cerebral infarction, unspecified: Secondary | ICD-10-CM | POA: Diagnosis not present

## 2024-07-26 DIAGNOSIS — R531 Weakness: Secondary | ICD-10-CM | POA: Diagnosis not present

## 2024-07-26 DIAGNOSIS — E43 Unspecified severe protein-calorie malnutrition: Secondary | ICD-10-CM | POA: Diagnosis not present

## 2024-07-26 LAB — GLUCOSE, CAPILLARY
Glucose-Capillary: 141 mg/dL — ABNORMAL HIGH (ref 70–99)
Glucose-Capillary: 143 mg/dL — ABNORMAL HIGH (ref 70–99)
Glucose-Capillary: 161 mg/dL — ABNORMAL HIGH (ref 70–99)
Glucose-Capillary: 175 mg/dL — ABNORMAL HIGH (ref 70–99)
Glucose-Capillary: 181 mg/dL — ABNORMAL HIGH (ref 70–99)
Glucose-Capillary: 198 mg/dL — ABNORMAL HIGH (ref 70–99)

## 2024-07-26 MED ORDER — QUETIAPINE FUMARATE 25 MG PO TABS
25.0000 mg | ORAL_TABLET | Freq: Every day | ORAL | Status: DC
  Administered 2024-07-26 – 2024-07-27 (×2): 25 mg
  Filled 2024-07-26 (×2): qty 1

## 2024-07-26 NOTE — Plan of Care (Signed)
  Problem: Education: Goal: Knowledge of disease or condition will improve Outcome: Not Progressing   Problem: Coping: Goal: Will verbalize positive feelings about self Outcome: Not Progressing   Problem: Self-Care: Goal: Ability to participate in self-care as condition permits will improve Outcome: Not Progressing

## 2024-07-26 NOTE — Progress Notes (Signed)
 "        [facility]                                                                               Larry Briggs, is a 88 y.o. male, DOB - 1936/07/08, FMW:[id number] Admit date - 07/18/2024    Outpatient Primary MD for the patient is [name], MD  LOS - 8  days    Brief summary   Larry Briggs is a 88 y.o. male with a history of CAD, hypertension, hyperlipidemia.  Patient presented secondary to extremity weakness and was found to have an acute stroke. Hospitalization complicated by dysphagia, from stroke, requiring cortrak placement and tube feeding.  Hospital course complicated by hospital delirium requiring the use of Geodon  and seroquel .    Assessment & Plan    Assessment and Plan:   Acute stroke Patient presented with symptoms including . MRI brain (12/17) confirms acute/subacute nonhemorrhagic 9 mm infarct in the posterior limb of the left internal capsule. MRA head significant for no large vessel occlusion or high grade stenosis. LDL of 39. Hemoglobin A1C of 5.4%. Transthoracic Echocardiogram significant for LVEF of 60-65% with indeterminate diastolic parameters and  no atrial level shunt. Neurology recommendations for continuing Xarelto . PT/OT recommendations for acute inpatient rehabilitation. Aspirin  discontinued per neurology recommendations. -Continue Xarelto    Agitation Confusion Possibly related to stroke vs hospital delirium. Improved after Geodon , but now has recurrent symptoms. Improvement with Seroquel .  The dose of seroquel  was decreased for somnolence, but once the dose was decreased, he became more agitated and did not sleep last night. Will increase the dose to 25 mg daily and monitor.    Primary hypertension Optimal.  Continue with metoprolol  12.5 mg BID.    CAD Currently denies any chest pain.    Paroxysmal atrial fibrillation Noted on EKG. Patient has a known history of atrial fibrillation. Patient also with frequent PVCs. Echo this  admission with normal LVEF and normal atria. Patient follows with Dr. Jordan as an outpatient. He is on Xarelto  and Toprol  XL as an outpatient; Toprol  XL not resumed on admission. -Continue Xarelto  -Continue metoprolol  tartrate 12.5 mg BID while requiring a feeding tube; transition back to metoprolol  succinate once able to take PO   Ileus Noted on abdominal x-ray. Patient without abdominal pain, nausea or vomiting. Patient is having bowel movements. -no abdominal pain. Had a BM yesterday.  Abd still distended, but he is having bowel movements.   Dysphagia Related to acute stroke. Patient seen and evaluated by speech therapy with recommendation for NPO. Cortrak NG tube placed on 12/19 and tube feeds started. -SLP recommendations (12/19): NPO -Dietitian recommendations (12/19): Initiate tube feeding via Cortrak: initiate at 20 ml/h and increase 10 ml q10h until goal rate is reached Osmolite at 50 ml/h (1200 ml per day) Prosource TF20 60 ml daily Provides 1880 kcal, 95 gm protein, 914 ml free water  daily FWF: 150 ml q6h (total free water : 1514 ml daily)   Monitor magnesium  and phosphorus daily x 4 occurrences, MD to replete as needed, as pt is at risk for refeeding syndrome given severe malnutrition. Daily thiamine  100 mg x 7 days   Plan for SLP eval  on Friday , plan for PEG as patient's wife wants artificial feeding.  They also want to talk to palliative care. Palliative care consulted.   Severe malnutrition Tube feeds as mentioned above.   Pericardial effusion Noted on echo. Noted as small in volume with evidence of diastolic collapse of the right ventricular free wall without evidence of cardiac tamponade.   Anemia of chronic disease: Hemoglobin around 10 .    RN Pressure Injury Documentation:    Malnutrition Type:  Nutrition Problem: Severe Malnutrition Etiology: social / environmental circumstances (functional decline related to aging)   Malnutrition  Characteristics:  Signs/Symptoms: severe fat depletion, severe muscle depletion   Nutrition Interventions:  Interventions: Tube feeding  Estimated body mass index is 23.22 kg/m as calculated from the following:   Height as of this encounter: 5' 10 (1.778 m).   Weight as of this encounter: 73.4 kg.  Code Status: full code.  DVT Prophylaxis:  SCD's Start: 07/18/24 2339 Rivaroxaban  (XARELTO ) tablet 15 mg   Level of Care: Level of care: Med-Surg Family Communication: discussed with wife in bedside.  Disposition Plan:     Remains inpatient appropriate:  pending.   Procedures:  None.   Consultants:   SLP Neurology.   Antimicrobials:   Anti-infectives (From admission, onward)    None        Medications  Scheduled Meds:   stroke: early stages of recovery book   Does not apply Once   Chlorhexidine  Gluconate Cloth  6 each Topical Daily   cyanocobalamin   1,000 mcg Per Tube Daily   feeding supplement (PROSource TF20)  60 mL Per Tube Daily   free water   150 mL Per Tube Q6H   metoprolol  tartrate  12.5 mg Per Tube BID   Polyethyl Glycol-Propyl Glycol  1 drop Both Eyes BID   QUEtiapine   25 mg Per Tube QHS   rivaroxaban   15 mg Per Tube Q supper   simvastatin   40 mg Per Tube q1800   Continuous Infusions:  feeding supplement (OSMOLITE 1.5 CAL) 1,000 mL (07/26/24 1021)   PRN Meds:.acetaminophen  **OR** acetaminophen  (TYLENOL ) oral liquid 160 mg/5 mL **OR** acetaminophen , haloperidol  lactate, ondansetron  (ZOFRAN ) IV    Subjective:   Larry Briggs was seen and examined today.  No chest pain or sob. Still very irritable   Objective:   Vitals:   07/25/24 2324 07/26/24 0342 07/26/24 0836 07/26/24 1159  BP: (!) 136/48 (!) 105/47 (!) 138/50 (!) 151/59  Pulse: 71 85 75 66  Resp: 18 18 16 18   Temp: (!) 97.5 F (36.4 C) 98 F (36.7 C) 98.5 F (36.9 C) 98.6 F (37 C)  TempSrc: Oral Oral    SpO2:  99%    Weight:      Height:        Intake/Output Summary (Last 24  hours) at 07/26/2024 1344 Last data filed at 07/26/2024 1221 Gross per 24 hour  Intake 540 ml  Output --  Net 540 ml   Filed Weights   07/23/24 1300 07/24/24 0556 07/25/24 0500  Weight: 74.8 kg 72.5 kg 73.4 kg     Exam General exam: Appears calm and comfortable  Respiratory system: Clear to auscultation. Respiratory effort normal. Cardiovascular system: S1 & S2 heard, irregularly irregular.  Gastrointestinal system: Abdomen is mildly distended,  soft and nontender.  Central nervous system: Alert and oriented to person and place.  Extremities: no pedal edema.  Skin: No rashes, Psychiatry: irritable.    Data Reviewed:  I have personally reviewed following labs  and imaging studies   CBC Lab Results  Component Value Date   WBC 11.2 (H) 07/21/2024   RBC 3.45 (L) 07/21/2024   HGB 9.9 (L) 07/21/2024   HCT 30.0 (L) 07/21/2024   MCV 87.0 07/21/2024   MCH 28.7 07/21/2024   PLT 298 07/21/2024   MCHC 33.0 07/21/2024   RDW 14.1 07/21/2024   LYMPHSABS 0.5 (L) 07/18/2024   MONOABS 0.6 07/18/2024   EOSABS 0.0 07/18/2024   BASOSABS 0.0 07/18/2024     Last metabolic panel Lab Results  Component Value Date   NA 139 07/23/2024   K 3.7 07/23/2024   CL 101 07/23/2024   CO2 28 07/23/2024   BUN 36 (H) 07/23/2024   CREATININE 0.94 07/23/2024   GLUCOSE 186 (H) 07/23/2024   GFRNONAA >60 07/23/2024   GFRAA 77 07/10/2020   CALCIUM  8.3 (L) 07/23/2024   PHOS 3.1 07/24/2024   PROT 6.3 (L) 07/18/2024   ALBUMIN  4.1 07/18/2024   LABGLOB 2.6 07/10/2020   AGRATIO 1.6 07/10/2020   BILITOT 1.0 07/18/2024   ALKPHOS 90 07/18/2024   AST 20 07/18/2024   ALT 8 07/18/2024   ANIONGAP 9 07/23/2024    CBG (last 3)  Recent Labs    07/26/24 0347 07/26/24 0838 07/26/24 1202  GLUCAP 175* 198* 181*      Coagulation Profile: No results for input(s): INR, PROTIME in the last 168 hours.   Radiology Studies: No results found.      Elgie Butter M.D. [facility] 07/26/2024, 1:44 PM  Available via Epic secure chat 7am-7pm After 7 pm, please refer to night coverage provider listed on amion.    "

## 2024-07-27 ENCOUNTER — Inpatient Hospital Stay (HOSPITAL_COMMUNITY)

## 2024-07-27 DIAGNOSIS — Z7189 Other specified counseling: Secondary | ICD-10-CM | POA: Diagnosis not present

## 2024-07-27 DIAGNOSIS — Z515 Encounter for palliative care: Secondary | ICD-10-CM

## 2024-07-27 DIAGNOSIS — R531 Weakness: Secondary | ICD-10-CM | POA: Diagnosis not present

## 2024-07-27 DIAGNOSIS — I639 Cerebral infarction, unspecified: Secondary | ICD-10-CM | POA: Diagnosis not present

## 2024-07-27 DIAGNOSIS — E43 Unspecified severe protein-calorie malnutrition: Secondary | ICD-10-CM | POA: Diagnosis not present

## 2024-07-27 LAB — BASIC METABOLIC PANEL WITH GFR
Anion gap: 10 (ref 5–15)
BUN: 45 mg/dL — ABNORMAL HIGH (ref 8–23)
CO2: 25 mmol/L (ref 22–32)
Calcium: 8.5 mg/dL — ABNORMAL LOW (ref 8.9–10.3)
Chloride: 106 mmol/L (ref 98–111)
Creatinine, Ser: 0.93 mg/dL (ref 0.61–1.24)
GFR, Estimated: >60 mL/min
Glucose, Bld: 149 mg/dL — ABNORMAL HIGH (ref 70–99)
Potassium: 4.3 mmol/L (ref 3.5–5.1)
Sodium: 141 mmol/L (ref 135–145)

## 2024-07-27 LAB — GLUCOSE, CAPILLARY
Glucose-Capillary: 132 mg/dL — ABNORMAL HIGH (ref 70–99)
Glucose-Capillary: 163 mg/dL — ABNORMAL HIGH (ref 70–99)
Glucose-Capillary: 167 mg/dL — ABNORMAL HIGH (ref 70–99)
Glucose-Capillary: 180 mg/dL — ABNORMAL HIGH (ref 70–99)
Glucose-Capillary: 190 mg/dL — ABNORMAL HIGH (ref 70–99)
Glucose-Capillary: 191 mg/dL — ABNORMAL HIGH (ref 70–99)

## 2024-07-27 LAB — CBC WITH DIFFERENTIAL/PLATELET
Abs Immature Granulocytes: 0.06 K/uL (ref 0.00–0.07)
Basophils Absolute: 0 K/uL (ref 0.0–0.1)
Basophils Relative: 0 %
Eosinophils Absolute: 0.2 K/uL (ref 0.0–0.5)
Eosinophils Relative: 2 %
HCT: 30.6 % — ABNORMAL LOW (ref 39.0–52.0)
Hemoglobin: 9.6 g/dL — ABNORMAL LOW (ref 13.0–17.0)
Immature Granulocytes: 1 %
Lymphocytes Relative: 8 %
Lymphs Abs: 0.9 K/uL (ref 0.7–4.0)
MCH: 29.1 pg (ref 26.0–34.0)
MCHC: 31.4 g/dL (ref 30.0–36.0)
MCV: 92.7 fL (ref 80.0–100.0)
Monocytes Absolute: 1.1 K/uL — ABNORMAL HIGH (ref 0.1–1.0)
Monocytes Relative: 10 %
Neutro Abs: 8.7 K/uL — ABNORMAL HIGH (ref 1.7–7.7)
Neutrophils Relative %: 79 %
Platelets: 378 K/uL (ref 150–400)
RBC: 3.3 MIL/uL — ABNORMAL LOW (ref 4.22–5.81)
RDW: 15.1 % (ref 11.5–15.5)
WBC: 10.9 K/uL — ABNORMAL HIGH (ref 4.0–10.5)
nRBC: 0 % (ref 0.0–0.2)

## 2024-07-27 NOTE — Progress Notes (Signed)
 Physical Therapy Treatment Patient Details Name: Larry Briggs MRN: [id number] DOB: 06-Jul-1936 Today's Date: 07/27/2024   History of Present Illness Larry Briggs is a 88 y.o. male who presented 07/18/24 with worsening right sided weakness and inability to walk. Of note, pt recently seen in ED yesterday (12/16) where MRI of the brain and lumbar spine were negative and he was d/c'd Home. Repeat MRI showed small 9 mm posterior limb internal capsule acute infarct. 12/22 became agitated ?delirium PMHx: CAD, HTN, and HLD.    PT Comments  Pt supine in bed on arrival.  Focused on bed mobility and sitting tolerance at edge of bed.  Pt continues to require mod-max assistance this session.  He remains unable to stand.  Pt has difficulty keeping his eyes open throughout session.  Pt is very pleasant but toleration remains limited.      If plan is discharge home, recommend the following: Two people to help with walking and/or transfers;Assistance with cooking/housework;Assist for transportation;Help with stairs or ramp for entrance;Supervision due to cognitive status;Direct supervision/assist for medications management;Two people to help with bathing/dressing/bathroom   Can travel by private vehicle     No  Equipment Recommendations  Other (comment) (TBD at next venue)    Recommendations for Other Services Rehab consult     Precautions / Restrictions Precautions Precautions: Fall Recall of Precautions/Restrictions: Impaired Precaution/Restrictions Comments: SBP < 160 Restrictions Weight Bearing Restrictions Per Provider Order: No     Mobility  Bed Mobility Overal bed mobility: Needs Assistance Bed Mobility: Supine to Sit, Sit to Supine     Supine to sit: Mod assist, +2 for physical assistance Sit to supine: Max assist, +2 for physical assistance Sit to sidelying: Mod assist, +2 for physical assistance, HOB elevated, Used rails General bed mobility comments: Pt required assistance  for trunk lowering to bed and raising up from bed.  Pt with poor ability to balance trunk.  Constant cues for head control, scapular retraction and dorsal extension.  Pt able to sit edge of bed x 6 min this pm.  Noticeable fatigue after sitting edge of bed.    Transfers                        Ambulation/Gait                   Stairs             Wheelchair Mobility     Tilt Bed    Modified Rankin (Stroke Patients Only)       Balance Overall balance assessment: Needs assistance Sitting-balance support: Bilateral upper extremity supported, Feet supported Sitting balance-Leahy Scale: Poor       Standing balance-Leahy Scale: Poor Standing balance comment: reliant on Stedy for support                            Communication Communication Communication: Impaired Factors Affecting Communication: Difficulty expressing self;Reduced clarity of speech  Cognition Arousal: Alert Behavior During Therapy: Flat affect   PT - Cognitive impairments: Difficult to assess Difficult to assess due to: Level of arousal Orientation impairments: Place, Time, Situation                   PT - Cognition Comments: kept eyes closed throughout majority of session Following commands: Impaired Following commands impaired: Follows one step commands inconsistently, Follows one step commands with increased time    Cueing  Cueing Techniques: Verbal cues, Tactile cues  Exercises      General Comments        Pertinent Vitals/Pain Pain Assessment Pain Assessment: No/denies pain Faces Pain Scale: Hurts a little bit Pain Location: low back Pain Descriptors / Indicators: Grimacing, Moaning Pain Intervention(s): Monitored during session, Repositioned    Home Living                          Prior Function            PT Goals (current goals can now be found in the care plan section) Acute Rehab PT Goals Patient Stated Goal: Son reports for  pt to regain his mobility Potential to Achieve Goals: Poor Progress towards PT goals: Progressing toward goals    Frequency    Min 3X/week      PT Plan      Co-evaluation              AM-PAC PT 6 Clicks Mobility   Outcome Measure  Help needed turning from your back to your side while in a flat bed without using bedrails?: Total Help needed moving from lying on your back to sitting on the side of a flat bed without using bedrails?: Total Help needed moving to and from a bed to a chair (including a wheelchair)?: Total Help needed standing up from a chair using your arms (e.g., wheelchair or bedside chair)?: Total Help needed to walk in hospital room?: Total Help needed climbing 3-5 steps with a railing? : Total 6 Click Score: 6    End of Session Equipment Utilized During Treatment: Gait belt Activity Tolerance: Patient limited by lethargy Patient left: in bed;with call bell/phone within reach;with bed alarm set;with family/visitor present;with restraints reapplied (posey mittens in place) Nurse Communication: Mobility status;Need for lift equipment PT Visit Diagnosis: Hemiplegia and hemiparesis;Other symptoms and signs involving the nervous system (R29.898);Other abnormalities of gait and mobility (R26.89);Unsteadiness on feet (R26.81) Hemiplegia - Right/Left: Right Hemiplegia - dominant/non-dominant: Dominant Hemiplegia - caused by: Cerebral infarction     Time: 8388-8366 PT Time Calculation (min) (ACUTE ONLY): 22 min  Charges:    $Therapeutic Activity: 8-22 mins PT General Charges $$ ACUTE PT VISIT: 1 Visit                     Toya HAMS , PTA Acute Rehabilitation Services Office [phone]    Toya JINNY Gosling 07/27/2024, 4:41 PM

## 2024-07-27 NOTE — Plan of Care (Signed)
 Pt on and off with intermittent confusion and trying to pull his Cortrak out. Has taken his Mitts off X1 this far. Pt rested intermittently. Remains confused to time and situation.  Q2h turn performed but patient did decline 2am turn stating I am fine, let me sleep  Problem: Education: Goal: Knowledge of disease or condition will improve Outcome: Progressing   Problem: Ischemic Stroke/TIA Tissue Perfusion: Goal: Complications of ischemic stroke/TIA will be minimized Outcome: Progressing   Problem: Coping: Goal: Will verbalize positive feelings about self Outcome: Progressing   Problem: Health Behavior/Discharge Planning: Goal: Ability to manage health-related needs will improve Outcome: Progressing   Problem: Self-Care: Goal: Ability to participate in self-care as condition permits will improve Outcome: Progressing   Problem: Education: Goal: Knowledge of General Education information will improve Description: Including pain rating scale, medication(s)/side effects and non-pharmacologic comfort measures Outcome: Progressing   Problem: Health Behavior/Discharge Planning: Goal: Ability to manage health-related needs will improve Outcome: Progressing

## 2024-07-27 NOTE — Progress Notes (Signed)
 Modified Barium Swallow Study  Patient Details  Name: Larry Briggs MRN: [id number] Date of Birth: 02-16-1936  Today's Date: 07/27/2024  Modified Barium Swallow completed.  Full report located under Chart Review in the Imaging Section.  History of Present Illness Larry Briggs is a 88 y.o. male from home with weakness on the right side with difficulty ambulating. MRI  Acute/subacute nonhemorrhagic 9 mm infarct in the posterior limb of the left internal capsule with associated T2 and FLAIR hyperintensities. PMH: CVA 2025 no dysphagia per wife, CAD, hypertension, hyperlipidemia. Repeat MBS for possible improvement in swallow function   Clinical Impression Pt's alertness/awareness and oropharyngeal swallow function improved from previous study exhibiting mild oropharyngeal dysphagia with trace aspiration of thin and nectar consistency. Pt had consistent mild oral lingual and occasional floor of mouth residue and brisk lingual transport of boluses. Aspiration resulted from  decreased laryngeal mobility with penentration that was aspirated (PAS 8) with subsequent swallow. Reduced timing with thin led to swallow initiating as barium entered laryngeal vestibule and aspirated during the swallow silently. Cued coughs ineffective in clearing trachea and may have reduced but not cleared vestibule penetration. Additional compensatory strategies not attempted as implementation with cues suspected to be ineffective. Recommend Dys 1 (puree), nectar thick liquids, frequent cued throat clears, small sips, pills crushed and full supervision. Educated wife pt may have transient aspiration with recommendations and voiced understanding and in agreement with recommendations. Disussed findings with MD. ST to continue to follow. Factors that may increase risk of adverse event in presence of aspiration Larry Briggs & Larry Briggs 2021): Reduced cognitive function;Limited mobility;Frail or deconditioned;Dependence for feeding  and/or oral hygiene  Swallow Evaluation Recommendations Recommendations: PO diet PO Diet Recommendation: Dysphagia 1 (Pureed);Mildly thick liquids (Level 2, nectar thick) Liquid Administration via: Cup;Straw Medication Administration: Crushed with puree Supervision: Staff to assist with self-feeding;Full supervision/cueing for swallowing strategies Swallowing strategies  : Slow rate;Small bites/sips;Check for pocketing or oral holding;Clear throat intermittently Postural changes: Position pt fully upright for meals Oral care recommendations: Oral care BID (2x/day)      Larry Briggs 07/27/2024,3:18 PM

## 2024-07-27 NOTE — Progress Notes (Signed)
 "        [facility]                                                                               Noam Karaffa, is a 88 y.o. male, DOB - 05-07-1936, FMW:[id number] Admit date - 07/18/2024    Outpatient Primary MD for the patient is [name], MD  LOS - 9  days    Brief summary   Larry Briggs is a 88 y.o. male with a history of CAD, hypertension, hyperlipidemia.  Patient presented secondary to extremity weakness and was found to have an acute stroke. Hospitalization complicated by dysphagia, from stroke, requiring cortrak placement and tube feeding.  Hospital course complicated by hospital delirium requiring the use of Geodon  and seroquel .  Plan for SLP today and PT evaluation. Palliative medicine consult in place for goals of  care.     Assessment & Plan    Assessment and Plan:   Acute stroke MRI brain (12/17) confirms acute/subacute nonhemorrhagic 9 mm infarct in the posterior limb of the left internal capsule. MRA head significant for no large vessel occlusion or high grade stenosis. LDL of 39. Hemoglobin A1C of 5.4%. Transthoracic Echocardiogram significant for LVEF of 60-65% with indeterminate diastolic parameters and  no atrial level shunt. Neurology recommendations for continuing Xarelto . PT/OT recommendations are for acute inpatient rehabilitation but as pt did not make any progress, he is not a candidate for CIR anymore.  Aspirin  discontinued per neurology recommendations and transitioned to xarelto  for secondary stroke prevention.    Agitation Confusion Possibly related to stroke vs hospital delirium. Improved after Geodon , but now has recurrent symptoms. Improvement with Seroquel .  As per the wife patient rested better with the increased dose of seroquel  and when given early.    Primary hypertension Well controlled.  Continue with metoprolol  12.5 mg BID.    CAD Currently denies any chest pain.    Paroxysmal atrial fibrillation Noted on EKG.  Patient has a known history of atrial fibrillation. Patient also with frequent PVCs. Echo this admission with normal LVEF and normal atria. Patient follows with Dr. Jordan as an outpatient. He is on Xarelto  and Toprol  XL as an outpatient; Toprol  XL not resumed on admission. -Continue Xarelto  -Continue metoprolol  tartrate 12.5 mg BID while requiring a feeding tube; transition back to metoprolol  succinate once able to take PO   Ileus Noted on abdominal x-ray. Patient without abdominal pain, nausea or vomiting. Patient is having bowel movements. -no abdominal pain.  Abd still distended, but he is having bowel movements.   Dysphagia Related to acute stroke. Patient seen and evaluated by speech therapy with recommendation for NPO. Cortrak NG tube placed on 12/19 and tube feeds started. -SLP recommendations (12/19): NPO -Dietitian recommendations (12/19): Initiate tube feeding via Cortrak: initiate at 20 ml/h and increase 10 ml q10h until goal rate is reached Osmolite at 50 ml/h (1200 ml per day) Prosource TF20 60 ml daily Provides 1880 kcal, 95 gm protein, 914 ml free water  daily FWF: 150 ml q6h (total free water : 1514 ml daily)   Monitor magnesium  and phosphorus daily x 4 occurrences, MD to replete as needed, as pt is at risk for  refeeding syndrome given severe malnutrition. Daily thiamine  100 mg x 7 days  SLP eval done and he did better on MBS.  SLP recommended putting him on puree and nectar-  as he aspirated thin and did have several instances of silent aspiration with the nectar and wife if aware that he may have intermittent aspiration and stated she understood the risks and agreed to recommendations.  They also want to talk to palliative care. Palliative care consulted.   Severe malnutrition Tube feeds as mentioned above.   Pericardial effusion Noted on echo. Noted as small in volume with evidence of diastolic collapse of the right ventricular free wall without evidence of cardiac  tamponade.   Anemia of chronic disease: Hemoglobin around 10 .    RN Pressure Injury Documentation:    Malnutrition Type:  Nutrition Problem: Severe Malnutrition Etiology: social / environmental circumstances (functional decline related to aging)   Malnutrition Characteristics:  Signs/Symptoms: severe fat depletion, severe muscle depletion   Nutrition Interventions:  Interventions: Tube feeding  Estimated body mass index is 23.22 kg/m as calculated from the following:   Height as of this encounter: 5' 10 (1.778 m).   Weight as of this encounter: 73.4 kg.  Code Status: full code.  DVT Prophylaxis:  SCD's Start: 07/18/24 2339 Rivaroxaban  (XARELTO ) tablet 15 mg   Level of Care: Level of care: Med-Surg Family Communication: discussed with wife in bedside.  Disposition Plan:     Remains inpatient appropriate:  pending.   Procedures:  None.   Consultants:   SLP Neurology.   Antimicrobials:   Anti-infectives (From admission, onward)    None        Medications  Scheduled Meds:   stroke: early stages of recovery book   Does not apply Once   Chlorhexidine  Gluconate Cloth  6 each Topical Daily   cyanocobalamin   1,000 mcg Per Tube Daily   feeding supplement (PROSource TF20)  60 mL Per Tube Daily   free water   150 mL Per Tube Q6H   metoprolol  tartrate  12.5 mg Per Tube BID   Polyethyl Glycol-Propyl Glycol  1 drop Both Eyes BID   QUEtiapine   25 mg Per Tube QHS   rivaroxaban   15 mg Per Tube Q supper   simvastatin   40 mg Per Tube q1800   Continuous Infusions:  feeding supplement (OSMOLITE 1.5 CAL) 1,000 mL (07/27/24 0641)   PRN Meds:.acetaminophen  **OR** acetaminophen  (TYLENOL ) oral liquid 160 mg/5 mL **OR** acetaminophen , haloperidol  lactate, ondansetron  (ZOFRAN ) IV    Subjective:   Angelica Wix was seen and examined today.  Large BM yesterday evening and this morning.  No abdominal pain. No new events.   Objective:   Vitals:   07/26/24 2036  07/27/24 0007 07/27/24 0429 07/27/24 0651  BP: (!) 130/57 125/65 (!) 140/112 (!) 142/79  Pulse: 96 97 94 97  Resp: (!) 22 20 (!) 22   Temp: 99.3 F (37.4 C) 98.4 F (36.9 C) 98.6 F (37 C)   TempSrc: Oral Oral Oral   SpO2: 97% 100% 100%   Weight:      Height:        Intake/Output Summary (Last 24 hours) at 07/27/2024 1311 Last data filed at 07/27/2024 1219 Gross per 24 hour  Intake 1224 ml  Output 875 ml  Net 349 ml   Filed Weights   07/23/24 1300 07/24/24 0556 07/25/24 0500  Weight: 74.8 kg 72.5 kg 73.4 kg     Exam General exam: Ill appearing elderly gentleman, not in distress  with cortrak in place.  Respiratory system: Clear to auscultation. Respiratory effort normal. Cardiovascular system: S1 & S2 heard, irregularly irregular, no JVD.  Gastrointestinal system: Abdomen is soft BS+ Central nervous system: Alert and oriented to person and place.  Extremities: no edema.  Skin: No rashes,  Psychiatry: Confused.     Data Reviewed:  I have personally reviewed following labs and imaging studies   CBC Lab Results  Component Value Date   WBC 10.9 (H) 07/27/2024   RBC 3.30 (L) 07/27/2024   HGB 9.6 (L) 07/27/2024   HCT 30.6 (L) 07/27/2024   MCV 92.7 07/27/2024   MCH 29.1 07/27/2024   PLT 378 07/27/2024   MCHC 31.4 07/27/2024   RDW 15.1 07/27/2024   LYMPHSABS 0.9 07/27/2024   MONOABS 1.1 (H) 07/27/2024   EOSABS 0.2 07/27/2024   BASOSABS 0.0 07/27/2024     Last metabolic panel Lab Results  Component Value Date   NA 141 07/27/2024   K 4.3 07/27/2024   CL 106 07/27/2024   CO2 25 07/27/2024   BUN 45 (H) 07/27/2024   CREATININE 0.93 07/27/2024   GLUCOSE 149 (H) 07/27/2024   GFRNONAA >60 07/27/2024   GFRAA 77 07/10/2020   CALCIUM  8.5 (L) 07/27/2024   PHOS 3.1 07/24/2024   PROT 6.3 (L) 07/18/2024   ALBUMIN  4.1 07/18/2024   LABGLOB 2.6 07/10/2020   AGRATIO 1.6 07/10/2020   BILITOT 1.0 07/18/2024   ALKPHOS 90 07/18/2024   AST 20 07/18/2024   ALT 8  07/18/2024   ANIONGAP 10 07/27/2024    CBG (last 3)  Recent Labs    07/27/24 0344 07/27/24 0731 07/27/24 1138  GLUCAP 190* 180* 132*      Coagulation Profile: No results for input(s): INR, PROTIME in the last 168 hours.   Radiology Studies: No results found.      Elgie Butter M.D. [facility] 07/27/2024, 1:11 PM  Available via Epic secure chat 7am-7pm After 7 pm, please refer to night coverage provider listed on amion.    "

## 2024-07-27 NOTE — Plan of Care (Signed)
" °  Problem: Education: Goal: Knowledge of disease or condition will improve Outcome: Not Progressing   Problem: Activity: Goal: Risk for activity intolerance will decrease Outcome: Not Progressing   Problem: Nutrition: Goal: Adequate nutrition will be maintained Outcome: Progressing   "

## 2024-07-27 NOTE — Consult Note (Addendum)
 "                              Consultation Note Date: 07/27/2024   Patient Name: Larry Briggs  DOB: 17-May-1936  MRN: [id number]  Age / Sex: 88 y.o., male  PCP: [name], MD Referring Physician: Cherlyn Labella, MD  Reason for Consultation: Establishing goals of care  HPI/Patient Profile: 88 y.o. male  with past medical history significant of CAD, hypertension, hyperlipidemia who was in his normal state of health and then yesterday developed weakness on the right side and was unable to walk.  EMS was called and he was transported to the ER.  MRI of the brain and lumbar spine were negative.  Patient was discharged and returned to ER due to persistent right-sided weakness.  He was afebrile and hemodynamically stable on presentation.  Labs were obtained, which showed WBC 8.2, hemoglobin 11.4, troponin 57, 43, urinalysis negative for infection.  Admitted on 07/18/2024 with ongoing right sided weakness.   MRI of the brain dated 07/18/2024 confirms acute/subacute nonhemorrhagic 9 mm infarct in the posterior limb of the left internal capsule.  MR a of the head significant for no large vessel occlusion or high-grade stenosis.  Transthoracic echocardiogram significant for LVEF of 60 to 65%.  This is hospital day # 9.  Note for 1 inpatient and 1 ED admissions in the last 6 months.  PMT has been consulted to assist with goals of care conversation. Patient/Family face treatment option decisions, advanced directive decisions and anticipatory care needs.   Family face treatment option decision, advance directive decisions and anticipatory care needs.   Clinical Assessment and Goals of Care:  I have reviewed medical records including: EPIC notes: Reviewed hospitalist note from 07/26/2024, detailing plan of care mainly for acute stroke, agitation, and paroxysmal atrial fibrillation.  Reviewed to track clinical course and prognostication.  MAR: Reviewed PRN meds received over the last 24 hours.   Received 650 mg of as needed Tylenol  on 07/26/2024 at 0024.  Reviewed to assess needs for medication adjustment to optimize comfort.  Available advanced directives in ACP: None on file currently, but AD documents collected from spouse pending upload to EHR.   Met with patient/family to discuss diagnosis prognosis, GOC, EOL wishes, disposition and options.  I introduced Palliative Medicine as specialized medical care for people living with serious illness. It focuses on providing relief from the symptoms and stress of a serious illness. The goal is to improve quality of life for both the patient and the family.  Created space and opportunity for family to explore thoughts and feelings regarding patient's current medical condition.   Visited patient in bed, found to be comfortable and without signs of distress. Patient is alert and oriented to person, place, and time. No pain or discomfort reported. Patient is able to make needs known and engage briefly in meaningful conversation with his wife and myself.   Patient's wife is able to articulate/demonstrate patient's reason for current hospital stay.  She shared that patient was at his usual state when he developed weakness on the right side and was unable to walk.  She understands that currently he is being treated for acute stroke and other relevant comorbidities.    We discussed about patient's short and long-term prognosis.  She understands that the overall prognosis is poor understanding patient's acute and chronic medical conditions.  She shared with me that she has noticed continued decline in  patient's overall health this year, and more of a functional decline with history of fall x 2 November of this year.  Goals of care were discussed with the patient and his wife.  The wife expressed confidence, based on prior conversations with patient, that the patient does not wish to prolong life or suffering, noting his longstanding struggles with cardiac  disease since his 58s.  She states that this preference is clearly documented in the patient's living will.  I also spoke directly with the patient regarding his goal and definition of quality of life, he shared that when his heart stops beating and he stops breathing, that signifies the end.  The wife was present during this discussion.  CODE STATUS was reviewed, and the patient stated that if it is time, then it is his time, expressing a clear desire to change his CODE STATUS from full code to DNR/DNI.  Regarding goals for the current hospitalization, the patient stated he would like to attempt physical and Occupational Therapy in hopes for clinical improvement.  It was discussed that participation in therapy requires adequate nutritional support, and that he currently has dysphagia necessitating artificial nutrition to meet daily caloric needs.  The patient agreed that nutritional support would be necessary to pursue therapy. The wise is hopeful that his swallowing capability will improve with time, however  should it not, she desires for a short-term trial of G-tube feeding solely to support caloric intake during rehabilitation.  Both the wife and the patient clearly stated that the G-tube is intended for temporary use only and that they plan to discontinue artificial feeding at the time of SNF discharge, with ultimate goal of returning home with home hospice services.  The current treatment plan was reviewed with patient and his wife.  Both wishes to continue current medical interventions at this time, allow time to assess outcomes, and pursue physical and Occupational Therapy at discharge.  They remain hopeful for some clinical improvement with a long-term plan of discharge from SNF to home with hospice support in place.  The overall severity of the patients illness was discussed with the patient and/or family. It was explained that the patient is experiencing a significant burden of disease, with  multiple chronic conditions and an acute illness contributing to a complex clinical picture. The discussion included education about the high risk for further health decline, potential for setbacks, and the likelihood of recurrent hospitalizations. The patient and/or family were informed that, even with recovery from the current episode, the overall prognosis remains guarded due to the cumulative impact of ongoing health challenges.  Educated wife on the different code status options:  Full Code: All possible life-saving measures will be used if the heart or breathing stops. DNR-Limited: Some interventions may be withheld, but other treatments may continue. DNR with pre-arrest interventions. Allow intubation and use of ACLS medications as appropriate DNR Comfort: No resuscitation or aggressive interventions; all care is focused on comfort. DNI: No breathing tube or mechanical ventilation will be used.    I provided education on the philosophy of hospice and palliative care, highlighting the focus on symptom management, comfort, and support for both patient and family. We discussed the different settings in which these services can be provided, including home hospice and residential hospice facilities, and the types of support available in each.  The family was informed that hospice is a federal Medicare benefit, and I reviewed what hospice services entail, including interdisciplinary support (nursing, social work, building services engineer), medication management, equipment provision,  and respite care. I clarified eligibility criteria and the process for enrollment.  A  discussion was had today regarding advanced directives.  Concepts specific to code status, artifical feeding and hydration, continued IV antibiotics and rehospitalization was had.  The difference between a aggressive medical intervention path  and a palliative comfort care path for this patient at this time was had.  Values and goals of care important  to patient and family were attempted to be elicited.  Discussed and educated that a stroke can cause permanent loss of movement, speech, and awareness. Noted that recovery may be limited, and care may shift to comfort and quality of life.   Discussed the importance of continued conversation with family and the medical providers regarding overall plan of care and treatment options, ensuring decisions are within the context of the patients values and GOCs.   Questions and concerns were addressed.  Hard Choices booklet left for review. The family was encouraged to call with questions or concerns.  PMT will continue to support holistically.   Social History: Patient lives home with his wife, married for 53 years. He has 2 children. He is a retired emergency planning/management officer. Enjoys deer hunting.   Functional and Nutritional State: Patient is currently with functional limitation due to acute and chronic illnesses. At baseline he ambulates with the use of a walker. Appetite has been sub optimal.   Palliative Symptoms: Generalized weakness, difficulty swallowing, intermitted confusion  Advance Directives: A detailed discussion regarding advanced directives was held with the patient and family today, highlighting the benefits of having clear documentation to guide care in accordance with the patients wishes. The patient has previously completed advanced directive documents. AD documents obtained from patient's wife, to be uploaded to EHR.   Code Status: Transition from full code to DNR/DNI   Primary Decision Maker:  Devere Pitt (wife)    SUMMARY OF RECOMMENDATIONS   # Complex medical decision making/goals of care Code Status: Change from FULL CODE to DNR/DNI.  DNR form completed Continue with current medical interventions, allow time for outcomes hopes for clinical improvement. Patient wants to try PT and OT therapy.  When medically stabilized, desires to be discharged to SNF for rehab. Patient  and his wife understands his ongoing oropharyngeal dysphagia and is hopeful this will improve with time. Patient is okay with short-term G-tube placement for artificial feeding in order to meet caloric requirements while undergoing therapy if needed with plan to discontinue G-tube after rehab. Ultimately plans to go home with wife, with hospice services on board. Spiritual care consult Palliative medicine team will continue to follow.   # Symptom management Per Primary team Palliative medicine is available to assist as needed.    # Psychosocial support Continue to provide psycho-social and emotional support to patient and patient's wife, 600  # Discharge planning Skilled nursing facility for PT OT.  # Treatment plan discussed with:  Patient, patient's wife Devere, nursing staff, and medical team.   Code Status/Advance Care Planning: DNR-Limited   Palliative Prophylaxis:  Aspiration, Bowel Regimen, Delirium Protocol, Frequent Pain Assessment, and Turn Reposition   Prognosis:  Prognosis is guarded given significant disease burden, and high risk for decompensation  Discharge Planning: To Be Determined    Discussed with: Patient, patient's wife Devere, nursing staff and medical team.    Primary Diagnoses: Present on Admission:  CVA (cerebral vascular accident) [facility])    Physical Exam Constitutional:      Appearance: He is ill-appearing.  HENT:  Head: Normocephalic and atraumatic.     Nose:     Comments: Cortrak in place for artificial feeding.     Mouth/Throat:     Mouth: Mucous membranes are moist.  Pulmonary:     Effort: Pulmonary effort is normal.  Abdominal:     General: Abdomen is flat. Bowel sounds are normal.     Palpations: Abdomen is soft.  Musculoskeletal:     Comments: Generalized weakness  Skin:    General: Skin is warm.  Neurological:     Mental Status: He is alert. Mental status is at baseline.     Comments: Alert and oriented to person and  place.   Psychiatric:        Mood and Affect: Mood normal.     Vital Signs: BP (!) 142/79 (BP Location: Right Leg)   Pulse 97   Temp 98.6 F (37 C) (Oral)   Resp (!) 22   Ht 5' 10 (1.778 m)   Wt 73.4 kg   SpO2 100%   BMI 23.22 kg/m  Pain Scale: 0-10 POSS *See Group Information*: S-Acceptable,Sleep, easy to arouse Pain Score: 0-No pain   SpO2: SpO2: 100 % O2 Device:SpO2: 100 % O2 Flow Rate: .    Palliative Assessment/Data: 30 to 40%  I personally spent a total of 90 minutes in the care of the patient today including preparing to see the patient, getting/reviewing separately obtained history, performing a medically appropriate exam/evaluation, counseling and educating, referring and communicating with other health care professionals, documenting clinical information in the EHR, independently interpreting results, and coordinating care.    Thank you for allowing us  to participate in the care of JAHMIRE RUFFINS  Kathlyne JULIANNA Tracie Mickey, NP  Palliative Medicine Team Team phone # [phone]  Thank you for allowing the Palliative Medicine Team to assist in the care of this patient. Please utilize secure chat with additional questions, if there is no response within 30 minutes please call the above phone number.  Palliative Medicine Team providers are available by phone from 7am to 7pm daily and can be reached through the team cell phone.  Should this patient require assistance outside of these hours, please call the patient's attending physician.   "

## 2024-07-28 DIAGNOSIS — I639 Cerebral infarction, unspecified: Secondary | ICD-10-CM | POA: Diagnosis not present

## 2024-07-28 DIAGNOSIS — E43 Unspecified severe protein-calorie malnutrition: Secondary | ICD-10-CM | POA: Diagnosis not present

## 2024-07-28 DIAGNOSIS — R531 Weakness: Secondary | ICD-10-CM | POA: Diagnosis not present

## 2024-07-28 DIAGNOSIS — Z515 Encounter for palliative care: Secondary | ICD-10-CM | POA: Diagnosis not present

## 2024-07-28 DIAGNOSIS — Z7189 Other specified counseling: Secondary | ICD-10-CM | POA: Diagnosis not present

## 2024-07-28 LAB — GLUCOSE, CAPILLARY
Glucose-Capillary: 170 mg/dL — ABNORMAL HIGH (ref 70–99)
Glucose-Capillary: 124 mg/dL — ABNORMAL HIGH (ref 70–99)
Glucose-Capillary: 175 mg/dL — ABNORMAL HIGH (ref 70–99)
Glucose-Capillary: 147 mg/dL — ABNORMAL HIGH (ref 70–99)
Glucose-Capillary: 125 mg/dL — ABNORMAL HIGH (ref 70–99)
Glucose-Capillary: 117 mg/dL — ABNORMAL HIGH (ref 70–99)

## 2024-07-28 MED ORDER — METOPROLOL SUCCINATE ER 25 MG PO TB24
25.0000 mg | ORAL_TABLET | Freq: Every day | ORAL | Status: DC
  Administered 2024-07-29 – 2024-08-01 (×4): 25 mg via ORAL
  Filled 2024-07-28: qty 1

## 2024-07-28 MED ORDER — QUETIAPINE FUMARATE 25 MG PO TABS
12.5000 mg | ORAL_TABLET | Freq: Every day | ORAL | Status: DC
  Administered 2024-07-28 – 2024-07-31 (×4): 12.5 mg
  Filled 2024-07-28 (×2): qty 1

## 2024-07-28 MED ORDER — ORAL CARE MOUTH RINSE: 15.0000 mL | OROMUCOSAL | Status: DC | PRN

## 2024-07-28 MED ORDER — ORAL CARE MOUTH RINSE
15.0000 mL | OROMUCOSAL | Status: DC
  Administered 2024-07-28 – 2024-08-01 (×14): 15 mL via OROMUCOSAL

## 2024-07-28 NOTE — Progress Notes (Signed)
 "        [facility]                                                                               Larry Briggs, is a 88 y.o. male, DOB - 12/15/1935, FMW:[id number] Admit date - 07/18/2024    Outpatient Primary MD for the patient is Larry Nottingham, MD  LOS - 10  days    Brief summary   Larry Briggs is a 88 y.o. male with a history of CAD, hypertension, hyperlipidemia.  Patient presented secondary to extremity weakness and was found to have an acute stroke. Hospitalization complicated by dysphagia, from stroke, requiring cortrak placement and tube feeding.  Hospital course complicated by hospital delirium requiring the use of Geodon  and seroquel .  SLP eval done on 12/26 recommending DYS 1 diet with nectar thick fluids.  PMT on board and following the patient.  Plan discussed with wife at bedside.     Assessment & Plan    Assessment and Plan:   Acute stroke MRI brain (12/17) confirms acute/subacute nonhemorrhagic 9 mm infarct in the posterior limb of the left internal capsule. MRA head significant for no large vessel occlusion or high grade stenosis. LDL of 39. Hemoglobin A1C of 5.4%. Transthoracic Echocardiogram significant for LVEF of 60-65% with indeterminate diastolic parameters and  no atrial level shunt. Neurology recommendations for continuing Xarelto . PT/OT recommendations are for acute inpatient rehabilitation but as pt did not make any progress, he is not a candidate for CIR anymore.  Aspirin  discontinued per neurology recommendations and transitioned to xarelto  for secondary stroke prevention.    Agitation Confusion Possibly related to stroke vs hospital delirium. Improved after Geodon , but now has recurrent symptoms which are on bay with seroquel .  As per the wife patient rested better with the increased dose of seroquel  and when given early.    Primary hypertension Optimally controlled.  Continue with metoprolol  12.5 mg BID.    CAD Currently denies  any chest pain.    Paroxysmal atrial fibrillation Noted on EKG. Patient has a known history of atrial fibrillation. Patient also with frequent PVCs. Echo this admission with normal LVEF and normal atria. Patient follows with Dr. Jordan as an outpatient. He is on Xarelto  and Toprol  XL as an outpatient; -Continue Xarelto  and Toprol  XL.   Ileus Noted on abdominal x-ray. Patient without abdominal pain, nausea or vomiting. Patient is having bowel movements.    Dysphagia Related to acute stroke. Patient seen and evaluated by speech therapy with recommendation for NPO. Cortrak NG tube placed on 12/19 and tube feeds started. SLP re evaluated on 12/26 and recommended dysphagia 1 diet with nectar thick liquids.  Holding the tube feeds to see if he will be more hungry and improve oral intake. Meanwhile resume supplementation with prosource, free water  . In view of his dysphagia and debility PMT consulted, following him,.   Severe malnutrition He was on tube feeds which are on hold for now.    Pericardial effusion Noted on echo. Noted as small in volume with evidence of diastolic collapse of the right ventricular free wall without evidence of cardiac tamponade.   Anemia of chronic disease: Hemoglobin around 10 .  Malnutrition Type:  Nutrition Problem: Severe Malnutrition Etiology: social / environmental circumstances (functional decline related to aging)   Malnutrition Characteristics:  Signs/Symptoms: severe fat depletion, severe muscle depletion   Nutrition Interventions:  Interventions: Tube feeding  Estimated body mass index is 23.38 kg/m as calculated from the following:   Height as of this encounter: 5' 10 (1.778 m).   Weight as of this encounter: 73.9 kg.  Code Status: full code.  DVT Prophylaxis:  SCD's Start: 07/18/24 2339 Rivaroxaban  (XARELTO ) tablet 15 mg   Level of Care: Level of care: Med-Surg Family Communication: discussed th plan with husband.   Disposition Plan:     Remains inpatient appropriate:  pending.possibly SNF when bed available.   Procedures:  None.   Consultants:   SLP Neurology.   Antimicrobials:   Anti-infectives (From admission, onward)    None        Medications  Scheduled Meds:   stroke: early stages of recovery book   Does not apply Once   Chlorhexidine  Gluconate Cloth  6 each Topical Daily   cyanocobalamin   1,000 mcg Per Tube Daily   feeding supplement (PROSource TF20)  60 mL Per Tube Daily   free water   150 mL Per Tube Q6H   metoprolol  tartrate  12.5 mg Per Tube BID   mouth rinse  15 mL Mouth Rinse 4 times per day   Polyethyl Glycol-Propyl Glycol  1 drop Both Eyes BID   QUEtiapine   12.5 mg Per Tube QHS   rivaroxaban   15 mg Per Tube Q supper   simvastatin   40 mg Per Tube q1800   Continuous Infusions:  feeding supplement (OSMOLITE 1.5 CAL) 1,000 mL (07/28/24 0433)   PRN Meds:.acetaminophen  **OR** acetaminophen  (TYLENOL ) oral liquid 160 mg/5 mL **OR** acetaminophen , haloperidol  lactate, ondansetron  (ZOFRAN ) IV, mouth rinse    Subjective:   [name] was seen and examined today.  He is alert and answering simple questions.  Objective:   Vitals:   07/28/24 0314 07/28/24 0557 07/28/24 0814 07/28/24 1121  BP: (!) 102/57  137/72 (!) 99/38  Pulse: 78  89 80  Resp: 16  16 16   Temp: 98.6 F (37 C)  98.7 F (37.1 C) 98.9 F (37.2 C)  TempSrc: Oral  Oral Oral  SpO2: 98%  97% 95%  Weight:  73.9 kg    Height:        Intake/Output Summary (Last 24 hours) at 07/28/2024 1603 Last data filed at 07/28/2024 0555 Gross per 24 hour  Intake 1755.83 ml  Output 525 ml  Net 1230.83 ml   Filed Weights   07/24/24 0556 07/25/24 0500 07/28/24 0557  Weight: 72.5 kg 73.4 kg 73.9 kg     Exam General exam: Appears calm and comfortable  Respiratory system: Clear to auscultation. Respiratory effort normal. Cardiovascular system: S1 & S2 heard, RRR.  Gastrointestinal system: Abdomen is soft  bs+ Central nervous system: Alert and oriented to person and place.  Extremities: no pedal edema.  Skin: No rashes,  Psychiatry:  Mood & affect appropriate.      Data Reviewed:  I have personally reviewed following labs and imaging studies   CBC Lab Results  Component Value Date   WBC 10.9 (H) 07/27/2024   RBC 3.30 (L) 07/27/2024   HGB 9.6 (L) 07/27/2024   HCT 30.6 (L) 07/27/2024   MCV 92.7 07/27/2024   MCH 29.1 07/27/2024   PLT 378 07/27/2024   MCHC 31.4 07/27/2024   RDW 15.1 07/27/2024   LYMPHSABS 0.9 07/27/2024  MONOABS 1.1 (H) 07/27/2024   EOSABS 0.2 07/27/2024   BASOSABS 0.0 07/27/2024     Last metabolic panel Lab Results  Component Value Date   NA 141 07/27/2024   K 4.3 07/27/2024   CL 106 07/27/2024   CO2 25 07/27/2024   BUN 45 (H) 07/27/2024   CREATININE 0.93 07/27/2024   GLUCOSE 149 (H) 07/27/2024   GFRNONAA >60 07/27/2024   GFRAA 77 07/10/2020   CALCIUM  8.5 (L) 07/27/2024   PHOS 3.1 07/24/2024   PROT 6.3 (L) 07/18/2024   ALBUMIN  4.1 07/18/2024   LABGLOB 2.6 07/10/2020   AGRATIO 1.6 07/10/2020   BILITOT 1.0 07/18/2024   ALKPHOS 90 07/18/2024   AST 20 07/18/2024   ALT 8 07/18/2024   ANIONGAP 10 07/27/2024    CBG (last 3)  Recent Labs    07/28/24 0414 07/28/24 0816 07/28/24 1123  GLUCAP 170* 124* 175*      Coagulation Profile: No results for input(s): INR, PROTIME in the last 168 hours.   Radiology Studies: DG Swallowing Func-Speech Pathology Result Date: 07/27/2024 Table formatting from the original result was not included. Modified Barium Swallow Study Patient Details Name: SHARON STAPEL MRN: [id number] Date of Birth: 1935/10/12 Today's Date: 07/27/2024 HPI/PMH: HPI: JAVARIE CRISP is a 88 y.o. male from home with weakness on the right side with difficulty ambulating. MRI  Acute/subacute nonhemorrhagic 9 mm infarct in the posterior limb of the left internal capsule with associated T2 and FLAIR hyperintensities. PMH: CVA 2025  no dysphagia per wife, CAD, hypertension, hyperlipidemia. Repeat MBS for possible improvement in swallow function Clinical Impression: Clinical Impression: Pt's alertness/awareness and oropharyngeal swallow function improved from previous study exhibiting mild oropharyngeal dysphagia with trace aspiration of thin and nectar consistency. Pt had consistent mild oral lingual and occasional floor of mouth residue and brisk lingual transport of boluses. Aspiration resulted from  decreased laryngeal mobility with penentration that was aspirated (PAS 8) with subsequent swallow. Reduced timing with thin led to swallow initiating as barium entered laryngeal vestibule and aspirated during the swallow silently. Cued coughs ineffective in clearing trachea and may have reduced but not cleared vestibule penetration. Additional compensatory strategies not attempted as implementation with cues suspected to be ineffective. Recommend Dys 1 (puree), nectar thick liquids, frequent cued throat clears, small sips, pills crushed and full supervision. Educated wife pt may have transient aspiration with recommendations and voiced understanding and in agreement with recommendations. Disussed findings with MD. ST to continue to follow. Factors that may increase risk of adverse event in presence of aspiration [name] & [name] 2021): Factors that may increase risk of adverse event in presence of aspiration [name] & [name] 2021): Reduced cognitive function; Limited mobility; Frail or deconditioned; Dependence for feeding and/or oral hygiene Recommendations/Plan: Swallowing Evaluation Recommendations Swallowing Evaluation Recommendations Recommendations: PO diet PO Diet Recommendation: Dysphagia 1 (Pureed); Mildly thick liquids (Level 2, nectar thick) Liquid Administration via: Cup; Straw Medication Administration: Crushed with puree Supervision: Staff to assist with self-feeding; Full supervision/cueing for swallowing strategies Swallowing  strategies  : Slow rate; Small bites/sips; Check for pocketing or oral holding; Clear throat intermittently Postural changes: Position pt fully upright for meals Oral care recommendations: Oral care BID (2x/day) Treatment Plan Treatment Plan Treatment recommendations: Therapy as outlined in treatment plan below Follow-up recommendations: Skilled nursing-short term rehab (<3 hours/day) Functional status assessment: Patient has had a recent decline in their functional status and demonstrates the ability to make significant improvements in function in a reasonable and predictable amount of time.  Treatment frequency: Min 2x/week Treatment duration: 2 weeks Interventions: Aspiration precaution training; Patient/family education; Compensatory techniques; Diet toleration management by SLP Recommendations Recommendations for follow up therapy are one component of a multi-disciplinary discharge planning process, led by the attending physician.  Recommendations may be updated based on patient status, additional functional criteria and insurance authorization. Assessment: Orofacial Exam: Orofacial Exam Oral Cavity: Oral Hygiene: Xerostomia Oral Cavity - Dentition: Other (Comment) (wears dentures at baseline however not functional - do not fit with denture cream) Oral Motor/Sensory Function: -- (appears to have slight left labial weakness although stroke affected right side, general weakness) Anatomy: Anatomy: Other (Comment) (kyphsis) Boluses Administered: Boluses Administered Boluses Administered: Thin liquids (Level 0); Mildly thick liquids (Level 2, nectar thick); Moderately thick liquids (Level 3, honey thick); Puree  Oral Impairment Domain: Oral Impairment Domain Lip Closure: Escape from interlabial space or lateral juncture, no extension beyond vermillion border (had escape due to SLP removing cup too soon) Tongue control during bolus hold: Escape to lateral buccal cavity/floor of mouth Bolus preparation/mastication: --  (NT) Bolus transport/lingual motion: Brisk tongue motion Oral residue: Residue collection on oral structures Location of oral residue : Floor of mouth; Tongue Initiation of pharyngeal swallow : Posterior laryngeal surface of the epiglottis  Pharyngeal Impairment Domain: Pharyngeal Impairment Domain Soft palate elevation: No bolus between soft palate (SP)/pharyngeal wall (PW) Laryngeal elevation: Partial superior movement of thyroid  cartilage/partial approximation of arytenoids to epiglottic petiole Anterior hyoid excursion: Partial anterior movement Epiglottic movement: Complete inversion Laryngeal vestibule closure: Incomplete, narrow column air/contrast in laryngeal vestibule Pharyngeal stripping wave : Present - diminished Pharyngeal contraction (A/P view only): N/A Pharyngoesophageal segment opening: Partial distention/partial duration, partial obstruction of flow Tongue base retraction: Trace column of contrast or air between tongue base and PPW Pharyngeal residue: Collection of residue within or on pharyngeal structures (mild) Location of pharyngeal residue: Valleculae  Esophageal Impairment Domain: Esophageal Impairment Domain Esophageal clearance upright position: Esophageal retention (mild) Pill: No data recorded Penetration/Aspiration Scale Score: Penetration/Aspiration Scale Score 1.  Material does not enter airway: Puree; Moderately thick liquids (Level 3, honey thick) 2.  Material enters airway, remains ABOVE vocal cords then ejected out: Mildly thick liquids (Level 2, nectar thick); Thin liquids (Level 0) 8.  Material enters airway, passes BELOW cords without attempt by patient to eject out (silent aspiration) : Mildly thick liquids (Level 2, nectar thick); Thin liquids (Level 0) (trace amount) Compensatory Strategies: Compensatory Strategies Compensatory strategies: Yes Straw: Effective Effective Straw: Mildly thick liquid (Level 2, nectar thick)   General Information: Caregiver present: No  Diet  Prior to this Study: NPO; Cortrak/Small bore NG tube   Temperature : Normal   Respiratory Status: WFL   Supplemental O2: None (Room air)   History of Recent Intubation: No  Behavior/Cognition: Alert; Cooperative; Pleasant mood; Requires cueing Self-Feeding Abilities: Needs assist with self-feeding Baseline vocal quality/speech: Normal Volitional Cough: Able to elicit Volitional Swallow: Able to elicit Exam Limitations: No limitations Goal Planning: Prognosis for improved oropharyngeal function: -- (fair-good) Barriers to Reach Goals: Cognitive deficits No data recorded No data recorded Consulted and agree with results and recommendations: Family member/caregiver; Patient; Physician; Nurse Pain: Pain Assessment Pain Assessment: No/denies pain End of Session: Start Time:SLP Start Time (ACUTE ONLY): 1207 Stop Time: SLP Stop Time (ACUTE ONLY): 1220 Time Calculation:SLP Time Calculation (min) (ACUTE ONLY): 13 min Charges: SLP Evaluations $ SLP Speech Visit: 1 Visit SLP Evaluations $MBS Swallow: 1 Procedure SLP visit diagnosis: SLP Visit Diagnosis: Dysphagia, oropharyngeal phase (R13.12) Past Medical History:  Past Medical History: Diagnosis Date  CAD (coronary artery disease)   Remote MI in 1980. CABG 1995. Stent to left main in 2004. Last cath in April of 2012. EF 50%; patent LIMA to DX/LAD with collateralization of the distal right, patent SVG to OM, occluded SVG to distal RCA, and patent stent to LAD  History of heart attack 1980  Hypercholesteremia   Hypertension   Increased glucose level   Obesity  Past Surgical History: Past Surgical History: Procedure Laterality Date  CARDIAC CATHETERIZATION  1993  CARDIAC CATHETERIZATION  April 2012  Mild reduction if EF at 50%. Patent LIMA to DX/LAD with collateralization to distal RCA, patent SVG to OM and occluded SVG to distal right and patent stent to LAD/Left main;  CORONARY ARTERY BYPASS GRAFT  05/1994  x5 LIMA to LAD & DX, SVG to LCX, SVG to RCA  CORONARY STENT PLACEMENT   2004  Stent to the L main  INTRAMEDULLARY (IM) NAIL INTERTROCHANTERIC Left 09/05/2017  Procedure: INTRAMEDULLARY (IM) NAIL INTERTROCHANTRIC;  Surgeon: Beverley Evalene BIRCH, MD;  Location: [facility] OR;  Service: Orthopedics;  Laterality: Left; Dustin Olam Bull 07/27/2024, 3:17 PM       Elgie Butter M.D. [facility] 07/28/2024, 4:03 PM  Available via Epic secure chat 7am-7pm After 7 pm, please refer to night coverage provider listed on amion.    "

## 2024-07-28 NOTE — Progress Notes (Signed)
 "  Palliative Medicine Inpatient Follow Up Note   HPI: 88 y.o. male  with past medical history significant of CAD, hypertension, hyperlipidemia who was in his normal state of health and then yesterday developed weakness on the right side and was unable to walk.  EMS was called and he was transported to the ER.  MRI of the brain and lumbar spine were negative.  Patient was discharged and returned to ER due to persistent right-sided weakness.  He was afebrile and hemodynamically stable on presentation.  Labs were obtained, which showed WBC 8.2, hemoglobin 11.4, troponin 57, 43, urinalysis negative for infection.  Admitted on 07/18/2024 with ongoing right sided weakness.    MRI of the brain dated 07/18/2024 confirms acute/subacute nonhemorrhagic 9 mm infarct in the posterior limb of the left internal capsule.  MR a of the head significant for no large vessel occlusion or high-grade stenosis.  Transthoracic echocardiogram significant for LVEF of 60 to 65%.  Note for 1 inpatient and 1 ED admissions in the last 6 months.   PMT has been consulted to assist with goals of care conversation. Patient/Family face treatment option decisions, advanced directive decisions and anticipatory care needs.    Family face treatment option decision, advance directive decisions and anticipatory care needs.   This is hospital day #: 10  Today's Discussion 07/28/2024    I have reviewed medical records including:  EPIC notes: Reviewed hospitalist progress notes, physical therapy, and speech and language pathologist progress notes from 07/27/2024.  Patient currently being treated for acute stroke, with ongoing agitation and confusion.  Also with ongoing dysphagia.  Noted that SLP evaluation was done and he did better on MBS.  SLP recommending putting him on a pured and nectar diet.  PT notes stating patient continues to require moderate to maximum assistance during yesterday's session.  Reviewed to track clinical course  and prognostication.  MAR: Reviewed PRN meds received over the last 24 hours. Reviewed to assess needs for medication adjustment to optimize comfort.  Available advanced directives in ACP: Currently no ACP  documents available, but they are ready obtained This of patient's living will and HCPOA, pending upload to EHR. Labs: No recent labs to be reviewed.  Met with patient/family to discuss diagnosis prognosis, GOC, EOL wishes, disposition and options.  Visited patient at bedside today.  Wife present.  Patient observed to be resting comfortably in bed, Semi-Fowler's position.  Not in any form of distress.  Cortrak in place, artificial tube feeds in progress.  Patient denies any pain and discomfort.  Able to communicate with wife and this NP, briefly and goes back to sleep.  No signs of acute distress.  Able to make needs known.  The patient's wife reported that he is feeling much better today compared to prior days and has demonstrated improved oral intake, including eating a good breakfast this morning.  She is hopeful that his appetite and swallowing will continue to improve, allowing for discontinuation of the cortrak.  I reviewed the MBS results from yesterday, noting improved alertness and oropharyngeal swallow function compared to the prior study, with findings of mild oropharyngeal dysphagia and only trace aspiration of thin and nectar consistencies, which she was pleased to hear.  She also shared that after further discussion, the patient does not wish to pursue tube feeding of any kind, either short or short-term.  I affirmed that this is reasonable given his current improvement, discussed the risk of artificial tube feeding currently outweighs the benefits, and reviewed  potential complications.  The wife verbalized understanding.  Both the patient and his wife remain hopeful and plan to take things day by day.  Goals of care unchanged.  Continue treating reversible conditions, allow time to  assess outcomes, consider discharge to a skilled nursing facility for short-term rehab, and ultimately transition home with hospice services.  We discussed the importance of ongoing goals of care conversation and close observation of the patient's overall progress, with consideration to transition to hospice should he fail to progress or decline with therapy in order to prioritize quality of life.  Wife verbalized understanding and agreement.  Created space and opportunity for patient to explore thoughts feelings and fears regarding current medical situation.  Patient and her family face treatment option decisions, advanced directive decisions and anticipatory care needs.   Questions and concerns addressed   Palliative Support Provided.   Objective Assessment: Vital Signs Vitals:   07/28/24 0314 07/28/24 0814  BP: (!) 102/57 137/72  Pulse: 78 89  Resp: 16 16  Temp: 98.6 F (37 C) 98.7 F (37.1 C)  SpO2: 98% 97%    Intake/Output Summary (Last 24 hours) at 07/28/2024 0910 Last data filed at 07/28/2024 0555 Gross per 24 hour  Intake 1905.83 ml  Output 525 ml  Net 1380.83 ml   Last Weight  Most recent update: 07/28/2024  5:58 AM    Weight  73.9 kg (162 lb 14.7 oz)             Physical Exam Constitutional:      Appearance: He is ill-appearing.  HENT:     Head: Normocephalic and atraumatic.     Nose:     Comments: Cortrak in place for artificial feeding    Mouth/Throat:     Mouth: Mucous membranes are moist.  Cardiovascular:     Rate and Rhythm: Normal rate.  Pulmonary:     Effort: Pulmonary effort is normal.  Abdominal:     General: Abdomen is flat. Bowel sounds are normal.     Palpations: Abdomen is soft.  Musculoskeletal:     Comments: Generalized weakness, more on right sided weakness   Skin:    General: Skin is warm.  Neurological:     Mental Status: He is alert. Mental status is at baseline.     Comments: With periods of confusion.  Psychiatric:         Mood and Affect: Mood normal.     SUMMARY OF RECOMMENDATIONS    # Complex medical decision-making/goals of care  Code Status: Maintain DNR/DNI status Goal of care is medical stabilization and recovery to the extent this is possible, allow time for outcomes Patient wants to try PT and OT therapy.  When medically stabilized, he desires to be discharged to SNF for rehab. Patient's most recent MBS study is showing some improvement with swallowing function. Wife is hopeful for continued improvement with oral intake as patient does not desire for Gtube placement both short term and long term.  Recommends to continue to monitor progress, if performing poorly with PT and OT, to consider transitioning to hospice.  Ultimately the plan is to go home after rehab with wife with hospice services on board. Palliative medicine team will continue to follow.   # Symptom management Per Primary team Palliative medicine is available to assist as needed.    # Psychosocial support Continue to provide psycho-social and emotional support to patient and and patient's wife, Sosan.  # Discharge planning  When medically optimized, plan to  be discharged to skilled nursing facility for rehab.  # Prognosis  Prognosis is poor given patient's high disease burden and high risk for decompensation.    # Treatment plan discussed with:  Patient, patient's wife Devere, nursing staff and medical team.  Thank you for allowing us  to participate in the care of [name]  I personally spent a total of 50 minutes in the care of the patient today including preparing to see the patient, getting/reviewing separately obtained history, performing a medically appropriate exam/evaluation, counseling and educating, referring and communicating with other health care professionals, documenting clinical information in the EHR, communicating results, and coordinating care.   ______________________________________________________________________________________ Kathlyne Bolder NP-C [place] Palliative Medicine Team Team Cell Phone: [phone] Please utilize secure chat with additional questions, if there is no response within 30 minutes please call the above phone number  Palliative Medicine Team providers are available by phone from 7am to 7pm daily and can be reached through the team cell phone.  Should this patient require assistance outside of these hours, please call the patient's attending physician.     "

## 2024-07-28 NOTE — Plan of Care (Signed)
 Pt rested most of the night. Awake this morning, improved orientation   Problem: Education: Goal: Knowledge of disease or condition will improve Outcome: Progressing Goal: Knowledge of secondary prevention will improve (MUST DOCUMENT ALL) Outcome: Progressing   Problem: Ischemic Stroke/TIA Tissue Perfusion: Goal: Complications of ischemic stroke/TIA will be minimized Outcome: Progressing   Problem: Coping: Goal: Will verbalize positive feelings about self Outcome: Progressing   Problem: Health Behavior/Discharge Planning: Goal: Ability to manage health-related needs will improve Outcome: Progressing Goal: Goals will be collaboratively established with patient/family Outcome: Progressing   Problem: Nutrition: Goal: Risk of aspiration will decrease Outcome: Progressing Goal: Dietary intake will improve Outcome: Progressing

## 2024-07-29 ENCOUNTER — Inpatient Hospital Stay (HOSPITAL_COMMUNITY)

## 2024-07-29 DIAGNOSIS — R531 Weakness: Secondary | ICD-10-CM | POA: Diagnosis not present

## 2024-07-29 DIAGNOSIS — E43 Unspecified severe protein-calorie malnutrition: Secondary | ICD-10-CM | POA: Diagnosis not present

## 2024-07-29 DIAGNOSIS — Z515 Encounter for palliative care: Secondary | ICD-10-CM | POA: Diagnosis not present

## 2024-07-29 DIAGNOSIS — I639 Cerebral infarction, unspecified: Secondary | ICD-10-CM | POA: Diagnosis not present

## 2024-07-29 DIAGNOSIS — Z7189 Other specified counseling: Secondary | ICD-10-CM | POA: Diagnosis not present

## 2024-07-29 LAB — GLUCOSE, CAPILLARY
Glucose-Capillary: 114 mg/dL — ABNORMAL HIGH (ref 70–99)
Glucose-Capillary: 122 mg/dL — ABNORMAL HIGH (ref 70–99)
Glucose-Capillary: 135 mg/dL — ABNORMAL HIGH (ref 70–99)
Glucose-Capillary: 140 mg/dL — ABNORMAL HIGH (ref 70–99)
Glucose-Capillary: 141 mg/dL — ABNORMAL HIGH (ref 70–99)
Glucose-Capillary: 148 mg/dL — ABNORMAL HIGH (ref 70–99)

## 2024-07-29 MED ORDER — SILVER SULFADIAZINE 1 % EX CREA
TOPICAL_CREAM | Freq: Every day | CUTANEOUS | Status: DC
  Filled 2024-07-29: qty 85

## 2024-07-29 NOTE — Plan of Care (Signed)

## 2024-07-29 NOTE — Plan of Care (Signed)
 Pt is restless and confused though redirectable. Prn meds given.  Problem: Ischemic Stroke/TIA Tissue Perfusion: Goal: Complications of ischemic stroke/TIA will be minimized Outcome: Progressing   Problem: Health Behavior/Discharge Planning: Goal: Ability to manage health-related needs will improve Outcome: Progressing   Problem: Self-Care: Goal: Ability to participate in self-care as condition permits will improve Outcome: Progressing   Problem: Self-Care: Goal: Ability to communicate needs accurately will improve Outcome: Progressing   Problem: Nutrition: Goal: Risk of aspiration will decrease Outcome: Progressing   Problem: Nutrition: Goal: Dietary intake will improve Outcome: Progressing   Problem: Clinical Measurements: Goal: Will remain free from infection Outcome: Progressing   Problem: Clinical Measurements: Goal: Respiratory complications will improve Outcome: Progressing   Problem: Activity: Goal: Risk for activity intolerance will decrease Outcome: Progressing   Problem: Nutrition: Goal: Adequate nutrition will be maintained Outcome: Progressing   Problem: Safety: Goal: Ability to remain free from injury will improve Outcome: Progressing   Problem: Skin Integrity: Goal: Risk for impaired skin integrity will decrease Outcome: Progressing

## 2024-07-29 NOTE — Progress Notes (Signed)
 "        [facility]                                                                               Larry Briggs, is a 88 y.o. male, DOB - Jan 07, 1936, FMW:[id number] Admit date - 07/18/2024    Outpatient Primary MD for the patient is [name], MD  LOS - 11  days    Brief summary   Larry Briggs is a 88 y.o. male with a history of CAD, hypertension, hyperlipidemia.  Patient presented secondary to extremity weakness and was found to have an acute stroke. Hospitalization complicated by dysphagia, from stroke, requiring cortrak placement and tube feeding.  Hospital course complicated by hospital delirium requiring the use of Geodon  and seroquel .  SLP eval done on 12/26 recommending DYS 1 diet with nectar thick fluids.  PMT on board and following the patient.  Plan discussed with wife at bedside.  Patients bilateral arm wounds are improving. After we stopped the tube feeds his oral intake has improved.     Assessment & Plan    Assessment and Plan:   Acute stroke MRI brain (12/17) confirms acute/subacute nonhemorrhagic 9 mm infarct in the posterior limb of the left internal capsule. MRA head significant for no large vessel occlusion or high grade stenosis. LDL of 39. Hemoglobin A1C of 5.4%. Transthoracic Echocardiogram significant for LVEF of 60-65% with indeterminate diastolic parameters and  no atrial level shunt. Neurology recommendations for continuing Xarelto . PT/OT recommendations are for acute inpatient rehabilitation but as pt did not make any progress, he is not a candidate for CIR anymore.  Aspirin  discontinued per neurology recommendations and transitioned to xarelto  for secondary stroke prevention.    Agitation Confusion Possibly related to stroke vs hospital delirium. Improved after Geodon , but now has recurrent symptoms which are on bay with seroquel .  Patient on seroquel  at 12.5 mg daily at 6 pm.    Primary hypertension Well controlled.  Continue  with metoprolol  12.5 mg BID.    CAD Currently denies any chest pain.    Paroxysmal atrial fibrillation Noted on EKG. Patient has a known history of atrial fibrillation. Patient also with frequent PVCs. Echo this admission with normal LVEF and normal atria. Patient follows with Dr. Jordan as an outpatient. He is on Xarelto  and Toprol  XL as an outpatient; -Continue Xarelto  and Toprol  XL.   Ileus Noted on abdominal x-ray. Patient without abdominal pain, nausea or vomiting. Patient is having bowel movements.    Dysphagia Related to acute stroke. Patient seen and evaluated by speech therapy with recommendation for NPO. Cortrak NG tube placed on 12/19 and tube feeds started. SLP re evaluated on 12/26 and recommended dysphagia 1 diet with nectar thick liquids.  Holding the tube feeds to see if he will be more hungry and improve oral intake. Meanwhile resume supplementation with prosource, free water  . In view of his dysphagia and debility PMT consulted, following him,.   Severe malnutrition He was on tube feeds which are on hold for now.  Will request nutritionist to evaluate to make sure his oral intake is meeting  his nutritional requirements.  Will probably d/c the cortrak in a day  after his requirements are met.    Pericardial effusion Noted on echo. Noted as small in volume with evidence of diastolic collapse of the right ventricular free wall without evidence of cardiac tamponade.   Anemia of chronic disease: Hemoglobin around 10 .    Loose BM last night.  3 times.  Could it be from prosource?  Tube feeds were held.will check cbc and bmp in am.  Will get abd x ray.    Bilateral upper extremity skin tears and wounds -as per the wife the wounds are improving. Patient was seen by wound care earlier in the admission.  Silvadene  cream ordered.     Malnutrition Type:  Nutrition Problem: Severe Malnutrition Etiology: social / environmental circumstances (functional decline  related to aging)   Malnutrition Characteristics:  Signs/Symptoms: severe fat depletion, severe muscle depletion   Nutrition Interventions:  Interventions: Tube feeding  Estimated body mass index is 23.38 kg/m as calculated from the following:   Height as of this encounter: 5' 10 (1.778 m).   Weight as of this encounter: 73.9 kg.  Code Status: full code.  DVT Prophylaxis:  SCD's Start: 07/18/24 2339 Rivaroxaban  (XARELTO ) tablet 15 mg   Level of Care: Level of care: Med-Surg Family Communication: wife at bedside.  Disposition Plan:     Remains inpatient appropriate:  pending.possibly SNF when bed available.   Procedures:  None.   Consultants:   SLP Neurology.   Antimicrobials:   Anti-infectives (From admission, onward)    None        Medications  Scheduled Meds:   stroke: early stages of recovery book   Does not apply Once   Chlorhexidine  Gluconate Cloth  6 each Topical Daily   cyanocobalamin   1,000 mcg Per Tube Daily   feeding supplement (PROSource TF20)  60 mL Per Tube Daily   free water   150 mL Per Tube Q6H   metoprolol  succinate  25 mg Oral Daily   mouth rinse  15 mL Mouth Rinse 4 times per day   Polyethyl Glycol-Propyl Glycol  1 drop Both Eyes BID   QUEtiapine   12.5 mg Per Tube QHS   rivaroxaban   15 mg Per Tube Q supper   simvastatin   40 mg Per Tube q1800   Continuous Infusions:   PRN Meds:.acetaminophen  **OR** acetaminophen  (TYLENOL ) oral liquid 160 mg/5 mL **OR** acetaminophen , ondansetron  (ZOFRAN ) IV, mouth rinse    Subjective:   Larry Briggs was seen and examined today.  Loose BM last night. He is confused. But able to follow some commands.   Objective:   Vitals:   07/29/24 0500 07/29/24 0811 07/29/24 1128 07/29/24 1510  BP:  130/67 (!) 140/71 (!) 120/55  Pulse:  88 92 90  Resp:  19 20 17   Temp:  97.9 F (36.6 C) 99 F (37.2 C) 98.1 F (36.7 C)  TempSrc:  Oral Oral Oral  SpO2:  98% 97% 95%  Weight: 73.9 kg     Height:         Intake/Output Summary (Last 24 hours) at 07/29/2024 1538 Last data filed at 07/29/2024 1202 Gross per 24 hour  Intake 419 ml  Output 500 ml  Net -81 ml   Filed Weights   07/25/24 0500 07/28/24 0557 07/29/24 0500  Weight: 73.4 kg 73.9 kg 73.9 kg     Exam General exam: ill appearing elderly gentleman, not in distress. Respiratory system: Clear to auscultation. Respiratory effort normal. Cardiovascular system: S1 & S2 heard, RRR.  Gastrointestinal system: Abdomen is soft bs+  Central nervous system: Alert but confused.  Extremities: Symmetric 5 x 5 power. Skin: bilateral upper extremity skin tears and wounds.  Psychiatry: confused.      Data Reviewed:  I have personally reviewed following labs and imaging studies   CBC Lab Results  Component Value Date   WBC 10.9 (H) 07/27/2024   RBC 3.30 (L) 07/27/2024   HGB 9.6 (L) 07/27/2024   HCT 30.6 (L) 07/27/2024   MCV 92.7 07/27/2024   MCH 29.1 07/27/2024   PLT 378 07/27/2024   MCHC 31.4 07/27/2024   RDW 15.1 07/27/2024   LYMPHSABS 0.9 07/27/2024   MONOABS 1.1 (H) 07/27/2024   EOSABS 0.2 07/27/2024   BASOSABS 0.0 07/27/2024     Last metabolic panel Lab Results  Component Value Date   NA 141 07/27/2024   K 4.3 07/27/2024   CL 106 07/27/2024   CO2 25 07/27/2024   BUN 45 (H) 07/27/2024   CREATININE 0.93 07/27/2024   GLUCOSE 149 (H) 07/27/2024   GFRNONAA >60 07/27/2024   GFRAA 77 07/10/2020   CALCIUM  8.5 (L) 07/27/2024   PHOS 3.1 07/24/2024   PROT 6.3 (L) 07/18/2024   ALBUMIN  4.1 07/18/2024   LABGLOB 2.6 07/10/2020   AGRATIO 1.6 07/10/2020   BILITOT 1.0 07/18/2024   ALKPHOS 90 07/18/2024   AST 20 07/18/2024   ALT 8 07/18/2024   ANIONGAP 10 07/27/2024    CBG (last 3)  Recent Labs    07/29/24 0809 07/29/24 1128 07/29/24 1509  GLUCAP 122* 140* 114*      Coagulation Profile: No results for input(s): INR, PROTIME in the last 168 hours.   Radiology Studies: No results  found.       Elgie Butter M.D. [facility] 07/29/2024, 3:38 PM  Available via Epic secure chat 7am-7pm After 7 pm, please refer to night coverage provider listed on amion.    "

## 2024-07-29 NOTE — Progress Notes (Signed)
 "  Palliative Medicine Inpatient Follow Up Note   HPI:  88 y.o. male  with past medical history significant of CAD, hypertension, hyperlipidemia who was in his normal state of health and then yesterday developed weakness on the right side and was unable to walk.  EMS was called and he was transported to the ER.  MRI of the brain and lumbar spine were negative.  Patient was discharged and returned to ER due to persistent right-sided weakness.  He was afebrile and hemodynamically stable on presentation.  Labs were obtained, which showed WBC 8.2, hemoglobin 11.4, troponin 57, 43, urinalysis negative for infection.  Admitted on 07/18/2024 with ongoing right sided weakness.    MRI of the brain dated 07/18/2024 confirms acute/subacute nonhemorrhagic 9 mm infarct in the posterior limb of the left internal capsule.  MR a of the head significant for no large vessel occlusion or high-grade stenosis.  Transthoracic echocardiogram significant for LVEF of 60 to 65%.   Note for 1 inpatient and 1 ED admissions in the last 6 months.   PMT has been consulted to assist with goals of care conversation. Patient/Family face treatment option decisions, advanced directive decisions and anticipatory care needs.    Family face treatment option decision, advance directive decisions and anticipatory care needs.  This is hospital day #: 88  Today's Discussion 07/29/2024  I have reviewed medical records including:  EPIC notes: Reviewed hospitalist progress note from 07/28/2024 detailing plan of care mainly for acute stroke and dysphagia related to stroke.  Noted that patient is not a candidate for CIR.  Regarding ongoing dysphagia, we are holding the tube feeds to see if he will be more hungry and improve oral intake.  Reviewed to track clinical course and prognostication.  MAR: Reviewed PRN meds received over the last 24 hours.  Patient received as needed Haldol  1 mg this morning at 0103 for agitation.  Reviewed to assess  needs for medication adjustment to optimize comfort.  Available advanced directives in ACP: Currently none uploaded on file, but ACP documents already obtained from wife, pending upload. Labs: No recent labs to be reviewed  Met with patient and his wife to discuss diagnosis prognosis, GOC, EOL wishes, disposition and options.  Visited patient at bedside. Family member present. Patient observed sleeping comfortably, breathing spontaneously and evenly. No signs or gestures indicating pain or distress. Patient left undisturbed to promote and optimize comfort and rest.  This morning, patient's wife reported that the patient tolerated breakfast well and consumed most of the meal, which she feels reflects improvement in swallowing.  We discussed that enteral tube feedings are currently on hold to assess whether this supports increased oral intake.  She expressed hope that patient's overall condition will continue to improve.  The wife asked insightful questions regarding discharge planning.  Some questions were addressed by this NP, and I explained that the social worker would be better position to provide additional guidance on disposition and resources.  Goals of care were revisited.  The wife shared that she has reconsidered the prior plan for home hospice.  While the earlier plan was for discharge to skilled nursing facility for short-term rehabilitation followed by eventual transition to home with hospice services if the patient improved, she now prefers to take this situation day-by-day.  At this time, she does not wish to pursue home hospice.  She remains open to hospice philosophy of care should the patient decompensate in the future.  She is agreeable to outpatient palliative services and  to consider hospice care if the patient's condition worsens.  I informed her that this updated preference would be communicated to the medical team.  Goals of care remain unchanged.  Continue current medical  interventions, treat reversible conditions, allow time for outcomes, and hope for continued clinical improvement.  When medically optimized, the plan is to discharge to a skilled nursing facility for rehab, followed by home with outpatient palliative services.   Created space and opportunity for patient to explore thoughts feelings and fears regarding current medical situation.  Patient and her family face treatment option decisions, advanced directive decisions and anticipatory care needs.   Questions and concerns addressed   Palliative Support Provided.   Objective Assessment: Vital Signs Vitals:   07/29/24 0431 07/29/24 0811  BP: 139/80 130/67  Pulse: 94 88  Resp: 18 19  Temp: 97.8 F (36.6 C) 97.9 F (36.6 C)  SpO2: 97% 98%    Intake/Output Summary (Last 24 hours) at 07/29/2024 9048 Last data filed at 07/29/2024 0900 Gross per 24 hour  Intake 150 ml  Output 500 ml  Net -350 ml   Last Weight  Most recent update: 07/29/2024  6:53 AM    Weight  73.9 kg (162 lb 14.7 oz)             Physical Exam  SUMMARY OF RECOMMENDATIONS    # Complex medical decision-making/goals of care  Code Status: Maintain DNR/DNI status Goal of care is medical stabilization and recovery to the extent this is possible Recommends PT and OT therapy.  When medically stabilized, he desires to be discharged to skilled nursing facility for short-term rehab. Patient's most recent MBS study showing some improvement with swallowing function.  Wife is hopeful for continued improvement with oral intake, as patient does not desire for G-tube/enteral feeding both short-term and long-term. Recommends outpatient palliative services at discharge.  TOC consult placed to assist with this. Ultimately, the plan is to go home after rehab with wife, with palliative services on board.  Wife no longer wants to consider home hospice (as originally planned) at this time, and mentioned that for now, they will continue  to monitor for patient's progress, or decline and to consider home hospice at a later time, when the situation calls for it. Palliative medicine team will continue to follow.   # Symptom management Per Primary team Palliative medicine is available to assist as needed.    # Psychosocial support Continue to provide psycho-social and emotional support to patient and patient's wife, Devere  # Discharge planning  To be determined.  # Prognosis  Prognosis is poor given patient's high disease burden and high risk for decompensation.    # Treatment plan discussed with:  Patient, patient's wife Devere, nursing staff, and medical team.  I personally spent a total of 50 minutes in the care of the patient today including preparing to see the patient, getting/reviewing separately obtained history, performing a medically appropriate exam/evaluation, counseling and educating, referring and communicating with other health care professionals, documenting clinical information in the EHR, communicating results, and coordinating care.    Thank you for allowing us  to participate in the care of [name]   ______________________________________________________________________________________ Kathlyne Bolder NP-C [facility] Palliative Medicine Team Team Cell Phone: [phone] Please utilize secure chat with additional questions, if there is no response within 30 minutes please call the above phone number  Palliative Medicine Team providers are available by phone from 7am to 7pm daily and can be reached through the  team cell phone.  Should this patient require assistance outside of these hours, please call the patient's attending physician.     "

## 2024-07-30 DIAGNOSIS — E43 Unspecified severe protein-calorie malnutrition: Secondary | ICD-10-CM | POA: Diagnosis not present

## 2024-07-30 DIAGNOSIS — R531 Weakness: Secondary | ICD-10-CM | POA: Diagnosis not present

## 2024-07-30 DIAGNOSIS — I639 Cerebral infarction, unspecified: Secondary | ICD-10-CM | POA: Diagnosis not present

## 2024-07-30 LAB — GLUCOSE, CAPILLARY
Glucose-Capillary: 108 mg/dL — ABNORMAL HIGH (ref 70–99)
Glucose-Capillary: 120 mg/dL — ABNORMAL HIGH (ref 70–99)
Glucose-Capillary: 129 mg/dL — ABNORMAL HIGH (ref 70–99)
Glucose-Capillary: 132 mg/dL — ABNORMAL HIGH (ref 70–99)
Glucose-Capillary: 94 mg/dL (ref 70–99)

## 2024-07-30 LAB — BASIC METABOLIC PANEL WITH GFR
Anion gap: 10 (ref 5–15)
BUN: 44 mg/dL — ABNORMAL HIGH (ref 8–23)
CO2: 27 mmol/L (ref 22–32)
Calcium: 8.3 mg/dL — ABNORMAL LOW (ref 8.9–10.3)
Chloride: 106 mmol/L (ref 98–111)
Creatinine, Ser: 1.01 mg/dL (ref 0.61–1.24)
GFR, Estimated: >60 mL/min
Glucose, Bld: 104 mg/dL — ABNORMAL HIGH (ref 70–99)
Potassium: 3.7 mmol/L (ref 3.5–5.1)
Sodium: 142 mmol/L (ref 135–145)

## 2024-07-30 LAB — CBC WITH DIFFERENTIAL/PLATELET
Abs Immature Granulocytes: 0.1 K/uL — ABNORMAL HIGH (ref 0.00–0.07)
Basophils Absolute: 0.1 K/uL (ref 0.0–0.1)
Basophils Relative: 1 %
Eosinophils Absolute: 0.2 K/uL (ref 0.0–0.5)
Eosinophils Relative: 2 %
HCT: 29.7 % — ABNORMAL LOW (ref 39.0–52.0)
Hemoglobin: 9.3 g/dL — ABNORMAL LOW (ref 13.0–17.0)
Immature Granulocytes: 1 %
Lymphocytes Relative: 8 %
Lymphs Abs: 0.8 K/uL (ref 0.7–4.0)
MCH: 28.7 pg (ref 26.0–34.0)
MCHC: 31.3 g/dL (ref 30.0–36.0)
MCV: 91.7 fL (ref 80.0–100.0)
Monocytes Absolute: 0.8 K/uL (ref 0.1–1.0)
Monocytes Relative: 8 %
Neutro Abs: 8.8 K/uL — ABNORMAL HIGH (ref 1.7–7.7)
Neutrophils Relative %: 80 %
Platelets: 430 K/uL — ABNORMAL HIGH (ref 150–400)
RBC: 3.24 MIL/uL — ABNORMAL LOW (ref 4.22–5.81)
RDW: 14.8 % (ref 11.5–15.5)
WBC: 10.7 K/uL — ABNORMAL HIGH (ref 4.0–10.5)
nRBC: 0 % (ref 0.0–0.2)

## 2024-07-30 LAB — MAGNESIUM: Magnesium: 2.5 mg/dL — ABNORMAL HIGH (ref 1.7–2.4)

## 2024-07-30 LAB — PHOSPHORUS: Phosphorus: 3.2 mg/dL (ref 2.5–4.6)

## 2024-07-30 MED ORDER — POTASSIUM CHLORIDE CRYS ER 20 MEQ PO TBCR: 40.0000 meq | EXTENDED_RELEASE_TABLET | Freq: Two times a day (BID) | ORAL | Status: DC

## 2024-07-30 MED ORDER — POTASSIUM CHLORIDE CRYS ER 20 MEQ PO TBCR
40.0000 meq | EXTENDED_RELEASE_TABLET | Freq: Once | ORAL | Status: AC
  Administered 2024-07-30: 40 meq via ORAL
  Filled 2024-07-30: qty 2

## 2024-07-30 MED ORDER — ALBUTEROL SULFATE (2.5 MG/3ML) 0.083% IN NEBU: 3.0000 mL | INHALATION_SOLUTION | RESPIRATORY_TRACT | Status: DC | PRN

## 2024-07-30 NOTE — Plan of Care (Signed)
" °  Problem: Ischemic Stroke/TIA Tissue Perfusion: Goal: Complications of ischemic stroke/TIA will be minimized Outcome: Progressing   Problem: Health Behavior/Discharge Planning: Goal: Ability to manage health-related needs will improve Outcome: Progressing   Problem: Self-Care: Goal: Ability to communicate needs accurately will improve Outcome: Progressing   Problem: Nutrition: Goal: Risk of aspiration will decrease Outcome: Progressing   Problem: Health Behavior/Discharge Planning: Goal: Ability to manage health-related needs will improve Outcome: Progressing   Problem: Clinical Measurements: Goal: Will remain free from infection Outcome: Progressing   Problem: Nutrition: Goal: Adequate nutrition will be maintained Outcome: Progressing   Problem: Coping: Goal: Level of anxiety will decrease Outcome: Progressing   Problem: Elimination: Goal: Will not experience complications related to bowel motility Outcome: Progressing   Problem: Safety: Goal: Ability to remain free from injury will improve Outcome: Progressing   Problem: Skin Integrity: Goal: Risk for impaired skin integrity will decrease Outcome: Progressing   "

## 2024-07-30 NOTE — Progress Notes (Signed)
 Physical Therapy Treatment Patient Details Name: Larry Briggs MRN: [id number] DOB: Mar 07, 1936 Today's Date: 07/30/2024   History of Present Illness Larry Briggs is a 88 y.o. male who presented 07/18/24 with worsening right sided weakness and inability to walk. Of note, pt recently seen in ED yesterday (12/16) where MRI of the brain and lumbar spine were negative and he was d/c'd Home. Repeat MRI showed small 9 mm posterior limb internal capsule acute infarct. 12/22 became agitated ?delirium PMHx: CAD, HTN, and HLD.    PT Comments  Pt supine in bed on arrival.  He is more alert and conversive this session. Pt able to perform lower body strengthening this session.  He progressed to standing in RW at edge of bed x 2 trials with emphasis on posture and knee/hip extension.  Pt continues to improve and remains an excellent candidate for post acute rehab to improve strength and function before returning home to decrease care giver burden on his wife.      If plan is discharge home, recommend the following: Two people to help with walking and/or transfers;Assistance with cooking/housework;Assist for transportation;Help with stairs or ramp for entrance;Supervision due to cognitive status;Direct supervision/assist for medications management;Two people to help with bathing/dressing/bathroom   Can travel by private vehicle     No  Equipment Recommendations  Other (comment) (TBD at next venue)    Recommendations for Other Services Rehab consult     Precautions / Restrictions Precautions Precautions: Fall Recall of Precautions/Restrictions: Impaired Restrictions Weight Bearing Restrictions Per Provider Order: No     Mobility  Bed Mobility Overal bed mobility: Needs Assistance Bed Mobility: Rolling, Sidelying to Sit, Sit to Supine Rolling: Mod assist Sidelying to sit: Mod assist   Sit to supine: Max assist   General bed mobility comments: Pt required assistance for LE advance to edge   of bed and for trunk elevation.  Cues for use of bed rail to assist in mobility.  Pt with posterior bias.  Once in sitting he required assistance with use of chux pad to scoot to the edge of the bed.    Transfers Overall transfer level: Needs assistance Equipment used: Rolling walker (2 wheels) Transfers: Sit to/from Stand Sit to Stand: +2 safety/equipment, Max assist, From elevated surface           General transfer comment: Cues for hand placement.  Bed height elevated this session to improve ease of transfer.  Pt presents with flexed knees/hips in standing with cues for extension in B knees and hips as well as trunk control and forward gaze.    Ambulation/Gait Ambulation/Gait assistance:  (Remains unable to progress to gt but as standing tolerance improves this should be attainable.)                 Stairs             Wheelchair Mobility     Tilt Bed    Modified Rankin (Stroke Patients Only)       Balance Overall balance assessment: Needs assistance Sitting-balance support: Bilateral upper extremity supported, Feet supported Sitting balance-Leahy Scale: Poor       Standing balance-Leahy Scale: Poor Standing balance comment: reliant on RW and external assistance in standing.                            Communication Communication Communication: Impaired Factors Affecting Communication: Difficulty expressing self;Reduced clarity of speech  Cognition Arousal: Alert Behavior During  Therapy: Flat affect   PT - Cognitive impairments: Difficult to assess Difficult to assess due to: Level of arousal Orientation impairments: Place, Time, Situation                   PT - Cognition Comments: More alert and better ability to follow commands this session. Following commands: Impaired Following commands impaired: Follows one step commands inconsistently, Follows one step commands with increased time    Cueing Cueing Techniques: Verbal  cues, Tactile cues  Exercises General Exercises - Lower Extremity Ankle Circles/Pumps: AROM, Both, 10 reps, Supine Quad Sets: AROM, Both, 10 reps, Supine Heel Slides: AAROM, Both, 10 reps, Supine Hip ABduction/ADduction: AAROM, Both, 10 reps, Supine Straight Leg Raises: AAROM, Both, 10 reps, Supine    General Comments        Pertinent Vitals/Pain Pain Assessment Pain Assessment: No/denies pain Faces Pain Scale: Hurts a little bit Pain Location: low back Pain Descriptors / Indicators: Grimacing, Moaning Pain Intervention(s): Monitored during session, Repositioned    Home Living                          Prior Function            PT Goals (current goals can now be found in the care plan section) Acute Rehab PT Goals Patient Stated Goal: To get stronger before going home. Potential to Achieve Goals: Poor Progress towards PT goals: Progressing toward goals    Frequency    Min 3X/week      PT Plan      Co-evaluation              AM-PAC PT 6 Clicks Mobility   Outcome Measure  Help needed turning from your back to your side while in a flat bed without using bedrails?: A Lot Help needed moving from lying on your back to sitting on the side of a flat bed without using bedrails?: A Lot Help needed moving to and from a bed to a chair (including a wheelchair)?: A Lot Help needed standing up from a chair using your arms (e.g., wheelchair or bedside chair)?: A Lot Help needed to walk in hospital room?: Total Help needed climbing 3-5 steps with a railing? : Total 6 Click Score: 10    End of Session Equipment Utilized During Treatment: Gait belt Activity Tolerance: Patient tolerated treatment well Patient left: in bed;with call bell/phone within reach;with bed alarm set;with nursing/sitter in room;with family/visitor present Nurse Communication: Mobility status;Need for lift equipment PT Visit Diagnosis: Hemiplegia and hemiparesis;Other symptoms and signs  involving the nervous system (R29.898);Other abnormalities of gait and mobility (R26.89);Unsteadiness on feet (R26.81) Hemiplegia - Right/Left: Right Hemiplegia - dominant/non-dominant: Dominant Hemiplegia - caused by: Cerebral infarction     Time: 8497-8474 PT Time Calculation (min) (ACUTE ONLY): 23 min  Charges:    $Therapeutic Exercise: 8-22 mins $Therapeutic Activity: 8-22 mins PT General Charges $$ ACUTE PT VISIT: 1 Visit                     Toya HAMS , PTA Acute Rehabilitation Services Office [phone]    Norlan Rann JINNY Gosling 07/30/2024, 3:35 PM
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
## 2024-07-30 NOTE — Progress Notes (Signed)
 "        [facility]                                                                               [name], is a 88 y.o. male, DOB - 21-Nov-1935, FMW:[id number] Admit date - 07/18/2024    Outpatient Primary MD for the patient is [name], MD  LOS - 12  days    Brief summary   Larry Briggs is a 88 y.o. male with a history of CAD, hypertension, hyperlipidemia.  Patient presented secondary to extremity weakness and was found to have an acute stroke. Hospitalization complicated by dysphagia, from stroke, requiring cortrak placement and tube feeding.  Hospital course complicated by hospital delirium requiring the use of Geodon  and seroquel .  SLP eval done on 12/26 recommending DYS 1 diet with nectar thick fluids.  PMT on board and following the patient.  Plan discussed with wife at bedside.  Patients bilateral arm wounds are improving. After we stopped the tube feeds his oral intake has improved.  D/c cortrak today.    Assessment & Plan    Assessment and Plan:   Acute stroke MRI brain (12/17) confirms acute/subacute nonhemorrhagic 9 mm infarct in the posterior limb of the left internal capsule. MRA head significant for no large vessel occlusion or high grade stenosis. LDL of 39. Hemoglobin A1C of 5.4%. Transthoracic Echocardiogram significant for LVEF of 60-65% with indeterminate diastolic parameters and  no atrial level shunt. Neurology recommendations for continuing Xarelto . PT/OT recommendations are for acute inpatient rehabilitation but as pt did not make any progress, he is not a candidate for CIR anymore.  Aspirin  discontinued per neurology recommendations and transitioned to xarelto  for secondary stroke prevention.    Agitation Confusion Possibly related to stroke vs hospital delirium. Improved after Geodon , but now has recurrent symptoms which are on bay with seroquel .  Patient on seroquel  at 12.5 mg daily at 6 pm.  He is more alert as per the wife at  bedside. His confusion has improved.    Primary hypertension Well controlled.  Continue with metoprolol  12.5 mg BID.    CAD Currently denies any chest pain.    Paroxysmal atrial fibrillation Noted on EKG. Patient has a known history of atrial fibrillation. Patient also with frequent PVCs. Echo this admission with normal LVEF and normal atria. Patient follows with Dr. Jordan as an outpatient. He is on Xarelto  and Toprol  XL as an outpatient; -Continue Xarelto  and Toprol  XL.   Ileus Noted on abdominal x-ray. Patient without abdominal pain, nausea or vomiting. Patient is having bowel movements.    Dysphagia Related to acute stroke. Patient seen and evaluated by speech therapy with recommendation for NPO. Cortrak NG tube placed on 12/19 and tube feeds started. SLP re evaluated on 12/26 and recommended dysphagia 1 diet with nectar thick liquids.  His oral intake is slowly improving. We will d/c cortrak today and monitor.   Meanwhile resume supplementation with prosource, free water  . In view of his dysphagia and debility PMT consulted, following him,.   Severe malnutrition Supplementation on board.    Pericardial effusion Noted on echo. Noted as small in volume with evidence of diastolic collapse  of the right ventricular free wall without evidence of cardiac tamponade.   Anemia of chronic disease: Hemoglobin around 10 .   Mild leukocytosis Unclear etiology. Monitor.   Mild abdominal distention:  ABD xray shows ileus.  Recommend keeping K >4 and magnesium  >2.   Bilateral upper extremity skin tears and wounds -as per the wife the wounds are improving. Patient was seen by wound care earlier in the admission.  Silvadene  cream ordered.     Malnutrition Type:  Nutrition Problem: Severe Malnutrition Etiology: social / environmental circumstances (functional decline related to aging)   Malnutrition Characteristics:  Signs/Symptoms: severe fat depletion, severe muscle  depletion   Nutrition Interventions:  Interventions: Tube feeding  Estimated body mass index is 23.79 kg/m as calculated from the following:   Height as of this encounter: 5' 10 (1.778 m).   Weight as of this encounter: 75.2 kg.  Code Status: full code.  DVT Prophylaxis:  SCD's Start: 07/18/24 2339 Rivaroxaban  (XARELTO ) tablet 15 mg   Level of Care: Level of care: Med-Surg Family Communication: wife at bedside.  Disposition Plan:     Remains inpatient appropriate:  pending.possibly SNF when bed available.   Procedures:  None.   Consultants:   SLP Neurology.   Antimicrobials:   Anti-infectives (From admission, onward)    None        Medications  Scheduled Meds:   stroke: early stages of recovery book   Does not apply Once   Chlorhexidine  Gluconate Cloth  6 each Topical Daily   cyanocobalamin   1,000 mcg Per Tube Daily   feeding supplement (PROSource TF20)  60 mL Per Tube Daily   free water   150 mL Per Tube Q6H   metoprolol  succinate  25 mg Oral Daily   mouth rinse  15 mL Mouth Rinse 4 times per day   Polyethyl Glycol-Propyl Glycol  1 drop Both Eyes BID   QUEtiapine   12.5 mg Per Tube QHS   rivaroxaban   15 mg Per Tube Q supper   silver  sulfADIAZINE    Topical Daily   simvastatin   40 mg Per Tube q1800   Continuous Infusions:   PRN Meds:.acetaminophen  **OR** acetaminophen  (TYLENOL ) oral liquid 160 mg/5 mL **OR** acetaminophen , ondansetron  (ZOFRAN ) IV, mouth rinse    Subjective:   Larry Briggs was seen and examined today.  2 loose BM last night.   Objective:   Vitals:   07/30/24 0358 07/30/24 0500 07/30/24 0818 07/30/24 1159  BP: 101/85  112/79 116/87  Pulse: 84  87 92  Resp: 18  17 19   Temp: 98.5 F (36.9 C)  98.9 F (37.2 C) 98.7 F (37.1 C)  TempSrc: Oral  Oral Axillary  SpO2: 97%  97% 94%  Weight:  75.2 kg    Height:        Intake/Output Summary (Last 24 hours) at 07/30/2024 1502 Last data filed at 07/30/2024 1238 Gross per 24 hour   Intake 959 ml  Output 700 ml  Net 259 ml   Filed Weights   07/28/24 0557 07/29/24 0500 07/30/24 0500  Weight: 73.9 kg 73.9 kg 75.2 kg     Exam General exam: Ill appearing elderly gentleman, not in distress.  Respiratory system: Clear to auscultation. Respiratory effort normal. Cardiovascular system: S1 & S2 heard, RRR. No JVD,  Gastrointestinal system: Abdomen is soft ,  mildly distended, bs+  Central nervous system: Alert and oriented to person and place,  Extremities: no pedal edema.  Skin: multiple  upper extremity skin tears. Bandaged.  Psychiatry:  no agitation.       Data Reviewed:  I have personally reviewed following labs and imaging studies   CBC Lab Results  Component Value Date   WBC 10.7 (H) 07/30/2024   RBC 3.24 (L) 07/30/2024   HGB 9.3 (L) 07/30/2024   HCT 29.7 (L) 07/30/2024   MCV 91.7 07/30/2024   MCH 28.7 07/30/2024   PLT 430 (H) 07/30/2024   MCHC 31.3 07/30/2024   RDW 14.8 07/30/2024   LYMPHSABS 0.8 07/30/2024   MONOABS 0.8 07/30/2024   EOSABS 0.2 07/30/2024   BASOSABS 0.1 07/30/2024     Last metabolic panel Lab Results  Component Value Date   NA 142 07/30/2024   K 3.7 07/30/2024   CL 106 07/30/2024   CO2 27 07/30/2024   BUN 44 (H) 07/30/2024   CREATININE 1.01 07/30/2024   GLUCOSE 104 (H) 07/30/2024   GFRNONAA >60 07/30/2024   GFRAA 77 07/10/2020   CALCIUM  8.3 (L) 07/30/2024   PHOS 3.2 07/30/2024   PROT 6.3 (L) 07/18/2024   ALBUMIN  4.1 07/18/2024   LABGLOB 2.6 07/10/2020   AGRATIO 1.6 07/10/2020   BILITOT 1.0 07/18/2024   ALKPHOS 90 07/18/2024   AST 20 07/18/2024   ALT 8 07/18/2024   ANIONGAP 10 07/30/2024    CBG (last 3)  Recent Labs    07/30/24 0337 07/30/24 0818 07/30/24 1158  GLUCAP 108* 94 132*      Coagulation Profile: No results for input(s): INR, PROTIME in the last 168 hours.   Radiology Studies: DG Abd Portable 1V Result Date: 07/29/2024 CLINICAL DATA:  Abdominal distension. EXAM: PORTABLE ABDOMEN  - 1 VIEW COMPARISON:  Radiograph 07/24/2024 FINDINGS: Weighted enteric tube in the region of the distal stomach. Gaseous distension of bowel loops, primarily colon. Enteric contrast within the rectosigmoid colon. Enteric contrast in the right abdomen may be within few prominent small bowel loops or ascending colon. No evidence of free air. IMPRESSION: Gaseous distension of bowel loops, primarily colon. Residual enteric contrast within the rectosigmoid colon. Residual enteric contrast in the right abdomen may be within few prominent small bowel loops or ascending colon. Findings are suggestive of ileus. Electronically Signed   By: Andrea Gasman M.D.   On: 07/29/2024 18:36         Elgie Butter M.D. [facility] 07/30/2024, 3:02 PM  Available via Epic secure chat 7am-7pm After 7 pm, please refer to night coverage provider listed on amion.    "

## 2024-07-30 NOTE — Progress Notes (Signed)
 Speech Language Pathology Treatment: Dysphagia  Patient Details Name: Larry Briggs MRN: [id number] DOB: [birth year] Today's Date: 07/30/2024 Time: 9143-9079 SLP Time Calculation (min) (ACUTE ONLY): 24 min  Assessment / Plan / Recommendation Clinical Impression  Breakfast tray present and pt ate 100% on plate prior to SLP arrival and wife denied coughing. She reported cueing him for strategies. Attempted self feeding with left hand but bandage on elbow restricts movement.Observed with pudding and straw sips nectar thick Sunkist (demonstrated how to thicken for wife) with one reflexive throat clear after liquid and delayed cough at end of session. Had discussed with wife after MBS, staff that he may have periods of airway compromise even with recommendations. Discussed depending on when he is discharged he may have repeat MBS while here or discharge on nectar thick and can return for outpatient MBS. Dentures not fitting last week with cream and will reattempt to donn next session. She reports he ate breakfast sandwich without dentures. Introduced CTAR exercise (chin tuck against resistance) and completed 2 sets 10 reps. Encouraged wife to continue exercises including EMST.    HPI HPI: Larry Briggs is a 88 y.o. male from home with weakness on the right side with difficulty ambulating. MRI  Acute/subacute nonhemorrhagic 9 mm infarct in the posterior limb of the left internal capsule with associated T2 and FLAIR hyperintensities. PMH: CVA 2025 no dysphagia per wife, CAD, hypertension, hyperlipidemia. Repeat MBS for possible improvement in swallow function      SLP Plan           Swallow Evaluation Recommendations   Recommendations: PO diet PO Diet Recommendation: Dysphagia 1 (Pureed);Mildly thick liquids (Level 2, nectar thick) Liquid Administration via: Cup;Straw Medication Administration: Via alternative means (or crush puree) Supervision: Full assist for feeding Postural changes:  Position pt fully upright for meals Oral care recommendations: Oral care BID (2x/day)     Recommendations                     Oral care BID   Frequent or constant Supervision/Assistance Dysphagia, oropharyngeal phase (R13.12)           Dustin Olam Bull  07/30/2024, 9:27 AM

## 2024-07-30 NOTE — NC FL2 (Signed)
 " [place]  MEDICAID FL2 LEVEL OF CARE FORM     IDENTIFICATION  Patient Name: Larry Briggs Birthdate: November 14, 1935 Sex: male Admission Date (Current Location): 07/18/2024  [facility] and [place] Number:  Producer, Television/film/video and Address:  The . [facility], [address] [place], [place] [address]      Provider Number: [id number]  Attending Physician Name and Address:  Cherlyn Labella, MD  Relative Name and Phone Number:       Current Level of Care: Hospital Recommended Level of Care: Skilled Nursing Facility Prior Approval Number:    Date Approved/Denied:   PASRR Number: [id number]  Discharge Plan: SNF    Current Diagnoses: Patient Active Problem List   Diagnosis Date Noted   Protein-calorie malnutrition, severe 07/21/2024   Right sided weakness 07/19/2024   CVA (cerebral vascular accident) (HCC) 12/05/2022   Pseudomonas aeruginosa infection 11/20/2019   Abrasion 07/30/2019   Medication management 07/03/2019   Abnormal findings on diagnostic imaging of lung 03/02/2019   New onset atrial fibrillation (HCC)    Hypokalemia    Acute blood loss anemia    Hip fracture (HCC) 09/04/2017   Cough 04/22/2016   COPD GOLD 4  04/22/2016   T wave inversion in EKG 05/10/2011   Hypertension    Arteriosclerotic cardiovascular disease (ASCVD)    Hypercholesteremia    Obesity    Ischemic heart disease    Increased glucose level     Orientation RESPIRATION BLADDER Height & Weight     Self  Normal Incontinent, Indwelling catheter Weight: 165 lb 12.6 oz (75.2 kg) Height:  5' 10 (177.8 cm)  BEHAVIORAL SYMPTOMS/MOOD NEUROLOGICAL BOWEL NUTRITION STATUS      Incontinent Diet (see DC summary)  AMBULATORY STATUS COMMUNICATION OF NEEDS Skin   Extensive Assist Verbally Skin abrasions (right elbow, arm, knee; left knee, arm)                       Personal Care Assistance Level of Assistance  Bathing, Feeding, Dressing Bathing Assistance: Maximum  assistance Feeding assistance: Maximum assistance Dressing Assistance: Maximum assistance     Functional Limitations Info  Speech, Hearing   Hearing Info: Impaired Speech Info: Impaired (dysarthria)    SPECIAL CARE FACTORS FREQUENCY  PT (By licensed PT), OT (By licensed OT), Speech therapy     PT Frequency: 5x/wk OT Frequency: 5x/wk     Speech Therapy Frequency: 5x/wk      Contractures Contractures Info: Not present    Additional Factors Info  Code Status, Allergies, Psychotropic Code Status Info: Full Allergies Info: Viagra (Sildenafil), Iodinated Contrast Media Psychotropic Info: Seroquel  12.5 mg daily at bed         Current Medications (07/30/2024):  This is the current hospital active medication list Current Facility-Administered Medications  Medication Dose Route Frequency Provider Last Rate Last Admin    stroke: early stages of recovery book   Does not apply Once Dena Charleston, MD       acetaminophen  (TYLENOL ) tablet 650 mg  650 mg Oral Q4H PRN Dena Charleston, MD   650 mg at 07/25/24 1738   Or   acetaminophen  (TYLENOL ) 160 MG/5ML solution 650 mg  650 mg Per Tube Q4H PRN Dena Charleston, MD   650 mg at 07/27/24 2137   Or   acetaminophen  (TYLENOL ) suppository 650 mg  650 mg Rectal Q4H PRN Dena Charleston, MD       Chlorhexidine  Gluconate Cloth 2 % PADS 6 each  6  each Topical Daily Briana Elgin LABOR, MD   6 each at 07/30/24 9058   cyanocobalamin  (VITAMIN B12) tablet 1,000 mcg  1,000 mcg Per Tube Daily Briana Elgin LABOR, MD   1,000 mcg at 07/30/24 0941   feeding supplement (PROSource TF20) liquid 60 mL  60 mL Per Tube Daily Pokhrel, Laxman, MD   60 mL at 07/30/24 0941   free water  150 mL  150 mL Per Tube Q6H Pokhrel, Laxman, MD   150 mL at 07/30/24 1238   metoprolol  succinate (TOPROL -XL) 24 hr tablet 25 mg  25 mg Oral Daily Akula, Vijaya, MD   25 mg at 07/30/24 0941   ondansetron  (ZOFRAN ) injection 4 mg  4 mg Intravenous Q6H PRN Howerter, Justin B, DO   4 mg at  07/19/24 0534   Oral care mouth rinse  15 mL Mouth Rinse 4 times per day Cherlyn Labella, MD   15 mL at 07/30/24 1238   Oral care mouth rinse  15 mL Mouth Rinse PRN Akula, Vijaya, MD       Polyethyl Glycol-Propyl Glycol 0.4-0.3 % SOLN 1 drop  1 drop Both Eyes BID Briana Elgin LABOR, MD   1 drop at 07/30/24 9057   QUEtiapine  (SEROQUEL ) tablet 12.5 mg  12.5 mg Per Tube QHS Akula, Vijaya, MD   12.5 mg at 07/29/24 1827   Rivaroxaban  (XARELTO ) tablet 15 mg  15 mg Per Tube Q supper Briana Elgin LABOR, MD   15 mg at 07/29/24 1612   silver  sulfADIAZINE  (SILVADENE ) 1 % cream   Topical Daily Akula, Vijaya, MD   Given at 07/30/24 9057   simvastatin  (ZOCOR ) tablet 40 mg  40 mg Per Tube q1800 Briana Elgin LABOR, MD   40 mg at 07/29/24 1612     Discharge Medications: Please see discharge summary for a list of discharge medications.  Relevant Imaging Results:  Relevant Lab Results:   Additional Information SS#: [id number]  Almarie CHRISTELLA Goodie, LCSW     "

## 2024-07-31 ENCOUNTER — Other Ambulatory Visit (HOSPITAL_COMMUNITY): Payer: Self-pay

## 2024-07-31 DIAGNOSIS — I639 Cerebral infarction, unspecified: Secondary | ICD-10-CM | POA: Diagnosis not present

## 2024-07-31 DIAGNOSIS — R531 Weakness: Secondary | ICD-10-CM | POA: Diagnosis not present

## 2024-07-31 DIAGNOSIS — E43 Unspecified severe protein-calorie malnutrition: Secondary | ICD-10-CM | POA: Diagnosis not present

## 2024-07-31 LAB — GLUCOSE, CAPILLARY
Glucose-Capillary: 105 mg/dL — ABNORMAL HIGH (ref 70–99)
Glucose-Capillary: 93 mg/dL (ref 70–99)
Glucose-Capillary: 189 mg/dL — ABNORMAL HIGH (ref 70–99)
Glucose-Capillary: 157 mg/dL — ABNORMAL HIGH (ref 70–99)
Glucose-Capillary: 192 mg/dL — ABNORMAL HIGH (ref 70–99)

## 2024-07-31 MED ORDER — ADULT MULTIVITAMIN W/MINERALS CH
1.0000 | ORAL_TABLET | Freq: Every day | ORAL | Status: DC
  Administered 2024-07-31 – 2024-08-01 (×2): 1 via ORAL
  Filled 2024-07-31 (×2): qty 1

## 2024-07-31 MED ORDER — ENSURE PLUS HIGH PROTEIN PO LIQD
237.0000 mL | Freq: Two times a day (BID) | ORAL | Status: DC
  Administered 2024-07-31 – 2024-08-01 (×2): 237 mL via ORAL

## 2024-07-31 NOTE — Progress Notes (Signed)
 Occupational Therapy Treatment Patient Details Name: Larry Briggs MRN: [id number] DOB: [birth year] Today's Date: 07/31/2024   History of present illness Larry Briggs is a 88 y.o. male who presented 07/18/24 with worsening right sided weakness and inability to walk. Of note, pt recently seen in ED yesterday (12/16) where MRI of the brain and lumbar spine were negative and he was d/c'd Home. Repeat MRI showed small 9 mm posterior limb internal capsule acute infarct. 12/22 became agitated ?delirium PMHx: CAD, HTN, and HLD.   OT comments  Patient received in supine and agreeable to OT/PT treatment. Patient demonstrating good gains with bed mobility with min assist to get to EOB. Patient demonstrating trunk flexion on EOB and required min to CGA for sitting balance. Patient stood from EOB with max assist +2 to RW and quickly fatigued and demonstrated increased trunk flexion. Patient performed standing with Stedy with max assist +2 and again from [facility] pads. Patient stating increased fatigue and assisted back to supine. Shoulder AROM performed while in supine. Patient will benefit from continued inpatient follow up therapy, <3 hours/day.  Acute OT to continue to follow to address established goals to facilitate DC to next venue of care.        If plan is discharge home, recommend the following:  Two people to help with walking and/or transfers;Two people to help with bathing/dressing/bathroom;Assistance with feeding;Direct supervision/assist for medications management;Direct supervision/assist for financial management;Assist for transportation;Help with stairs or ramp for entrance;Supervision due to cognitive status   Equipment Recommendations  Other (comment) (defer)    Recommendations for Other Services      Precautions / Restrictions Precautions Precautions: Fall Recall of Precautions/Restrictions: Impaired Precaution/Restrictions Comments: SBP < 160 Restrictions Weight Bearing  Restrictions Per Provider Order: No       Mobility Bed Mobility Overal bed mobility: Needs Assistance Bed Mobility: Rolling, Sidelying to Sit, Sit to Supine Rolling: Mod assist Sidelying to sit: Min assist   Sit to supine: Max assist   General bed mobility comments: increased assistance to return to supine due to fatigue    Transfers Overall transfer level: Needs assistance Equipment used: Rolling walker (2 wheels), Ambulation equipment used Transfers: Sit to/from Stand Sit to Stand: +2 safety/equipment, Max assist, From elevated surface           General transfer comment: patient stood from EOB into RW with max assist +2, demonstrating trunk flexion and quickly fatigues.  Stood from EOB into [place] with max assit +2 and another stand from [employer] via Lift Equipment: [employer] Overall balance assessment: Needs assistance Sitting-balance support: Bilateral upper extremity supported, Feet supported Sitting balance-Leahy Scale: Poor Sitting balance - Comments: min assist Postural control: Posterior lean, Right lateral lean Standing balance support: Bilateral upper extremity supported, During functional activity, Reliant on assistive device for balance Standing balance-Leahy Scale: Poor Standing balance comment: reliant on RW and Stedy for support                           ADL either performed or assessed with clinical judgement   ADL Overall ADL's : Needs assistance/impaired     Grooming: Wash/dry face;Supervision/safety;Sitting Grooming Details (indicate cue type and reason): on EOB                                    Extremity/Trunk Assessment  Vision       Perception     Praxis     Communication Communication Communication: Impaired Factors Affecting Communication: Difficulty expressing self;Reduced clarity of speech   Cognition Arousal: Alert Behavior During Therapy: Flat affect Cognition:  Cognition impaired   Orientation impairments: Time, Situation Awareness: Intellectual awareness impaired Memory impairment (select all impairments): Short-term memory   Executive functioning impairment (select all impairments): Problem solving, Sequencing OT - Cognition Comments: cognition improving                 Following commands: Impaired Following commands impaired: Follows one step commands inconsistently, Follows one step commands with increased time      Cueing   Cueing Techniques: Verbal cues, Tactile cues  Exercises      Shoulder Instructions       General Comments patient's wife not present during session    Pertinent Vitals/ Pain       Pain Assessment Pain Assessment: Faces Faces Pain Scale: Hurts a little bit Pain Location: low back Pain Descriptors / Indicators: Grimacing, Moaning Pain Intervention(s): Limited activity within patient's tolerance, Monitored during session, Repositioned  Home Living                                          Prior Functioning/Environment              Frequency  Min 2X/week        Progress Toward Goals  OT Goals(current goals can now be found in the care plan section)  Progress towards OT goals: Progressing toward goals     Plan      Co-evaluation                 AM-PAC OT 6 Clicks Daily Activity     Outcome Measure   Help from another person eating meals?: A Lot Help from another person taking care of personal grooming?: A Lot Help from another person toileting, which includes using toliet, bedpan, or urinal?: Total Help from another person bathing (including washing, rinsing, drying)?: A Lot Help from another person to put on and taking off regular upper body clothing?: A Lot Help from another person to put on and taking off regular lower body clothing?: Total 6 Click Score: 10    End of Session Equipment Utilized During Treatment: Gait belt;Rollator (4 wheels);Other  (comment) Laurent)  OT Visit Diagnosis: Unsteadiness on feet (R26.81);Repeated falls (R29.6);Muscle weakness (generalized) (M62.81);Cognitive communication deficit (R41.841) Symptoms and signs involving cognitive functions: Cerebral infarction   Activity Tolerance Patient tolerated treatment well   Patient Left in bed;with call bell/phone within reach;with bed alarm set   Nurse Communication Mobility status        Time: 8887-8860 OT Time Calculation (min): 27 min  Charges: OT General Charges $OT Visit: 1 Visit OT Treatments $Therapeutic Activity: 8-22 mins  Dick Laine, OTA Acute Rehabilitation Services  Office [phone]   Jeb LITTIE Laine 07/31/2024, 1:20 PM

## 2024-07-31 NOTE — Plan of Care (Signed)
" °  Problem: Ischemic Stroke/TIA Tissue Perfusion: Goal: Complications of ischemic stroke/TIA will be minimized Outcome: Progressing   Problem: Health Behavior/Discharge Planning: Goal: Ability to manage health-related needs will improve Outcome: Progressing   Problem: Nutrition: Goal: Risk of aspiration will decrease Outcome: Progressing   Problem: Health Behavior/Discharge Planning: Goal: Ability to manage health-related needs will improve Outcome: Progressing   Problem: Clinical Measurements: Goal: Respiratory complications will improve Outcome: Progressing   Problem: Clinical Measurements: Goal: Will remain free from infection Outcome: Progressing   Problem: Activity: Goal: Risk for activity intolerance will decrease Outcome: Progressing   Problem: Nutrition: Goal: Adequate nutrition will be maintained Outcome: Progressing   Problem: Safety: Goal: Ability to remain free from injury will improve Outcome: Progressing   Problem: Skin Integrity: Goal: Risk for impaired skin integrity will decrease Outcome: Progressing   "

## 2024-07-31 NOTE — Progress Notes (Signed)
 Nutrition Follow-up  DOCUMENTATION CODES:  Severe malnutrition in context of social or environmental circumstances (functional decline related to aging)  INTERVENTION:  Add Ensure Plus High Protein po BID, each supplement provides 350 kcal and 20 grams of protein Patient is receiving Magic cup BID with lunch and dinner, each supplement provides 290 kcal and 9 grams of protein Add MVI with minerals daily  NUTRITION DIAGNOSIS:  Severe Malnutrition related to social / environmental circumstances (functional decline related to aging) as evidenced by severe fat depletion, severe muscle depletion; ongoing.  GOAL:  Patient will meet greater than or equal to 90% of their needs; progressing.   MONITOR:  PO intake, Supplement acceptance, Skin  REASON FOR ASSESSMENT:  Consult Enteral/tube feeding initiation and management  ASSESSMENT:  Pt with hx of HTN, HLD, CAD, prior stroke, multiple falls within the last year. Admitted w/ R sided weakness, diagnosed CVA.  Cortrak feeding tube was removed 12/29. Patient is no longer receiving tube feeding. He remains on a dysphagia 1 diet with nectar thick liquids since 12/26 with meal intakes variable at 0-100%. Average meal intake is 49% since 12/28. He seems to eat a very good amount at breakfast, but minimal to no lunch and dinner. Intake has improved, but remains suboptimal. Patient would benefit from PO supplements to help maximize intake of protein and calories. Post acute rehab has been recommended by Physical Therapy.  Labs reviewed.  CBG: [id number]  Medications reviewed and include vitamin B12.  Admit weight: 74.8 kg (12/17) Current weight 75.1 kg (12/30)  Diet Order:   Diet Order             DIET - DYS 1 Room service appropriate? No; Fluid consistency: Nectar Thick  Diet effective now                   EDUCATION NEEDS:  No education needs have been identified at this time  Skin:  Skin Assessment: Skin Integrity  Issues: Skin Integrity Issues:: Other (Comment) Other: severe skin tears: R elbow to lower R hand, L elbow and arm, L knee, R knee, L arm  Last BM:  12/30 type 7  Height:  Ht Readings from Last 1 Encounters:  07/23/24 5' 10 (1.778 m)   Weight:  Wt Readings from Last 1 Encounters:  07/31/24 75.1 kg   Ideal Body Weight:  75.5 kg  BMI:  Body mass index is 23.76 kg/m.  Estimated Nutritional Needs:  Kcal:  1800-2000 Protein:  90-110g Fluid:  1.8-2L   Suzen HUNT RD, LDN, CNSC Contact via secure chat. If unavailable, use group chat RD Inpatient.

## 2024-07-31 NOTE — Plan of Care (Signed)
  Problem: Education: Goal: Knowledge of disease or condition will improve Outcome: Progressing Goal: Knowledge of secondary prevention will improve (MUST DOCUMENT ALL) Outcome: Progressing Goal: Knowledge of patient specific risk factors will improve (DELETE if not current risk factor) Outcome: Progressing   Problem: Ischemic Stroke/TIA Tissue Perfusion: Goal: Complications of ischemic stroke/TIA will be minimized Outcome: Progressing   Problem: Coping: Goal: Will verbalize positive feelings about self Outcome: Progressing Goal: Will identify appropriate support needs Outcome: Progressing   Problem: Health Behavior/Discharge Planning: Goal: Ability to manage health-related needs will improve Outcome: Progressing Goal: Goals will be collaboratively established with patient/family Outcome: Progressing   Problem: Self-Care: Goal: Ability to participate in self-care as condition permits will improve Outcome: Progressing Goal: Verbalization of feelings and concerns over difficulty with self-care will improve Outcome: Progressing Goal: Ability to communicate needs accurately will improve Outcome: Progressing   Problem: Nutrition: Goal: Risk of aspiration will decrease Outcome: Progressing Goal: Dietary intake will improve Outcome: Progressing   Problem: Education: Goal: Knowledge of disease or condition will improve Outcome: Progressing Goal: Knowledge of secondary prevention will improve (MUST DOCUMENT ALL) Outcome: Progressing Goal: Knowledge of patient specific risk factors will improve (DELETE if not current risk factor) Outcome: Progressing   Problem: Ischemic Stroke/TIA Tissue Perfusion: Goal: Complications of ischemic stroke/TIA will be minimized Outcome: Progressing   Problem: Coping: Goal: Will verbalize positive feelings about self Outcome: Progressing Goal: Will identify appropriate support needs Outcome: Progressing   Problem: Health Behavior/Discharge  Planning: Goal: Ability to manage health-related needs will improve Outcome: Progressing Goal: Goals will be collaboratively established with patient/family Outcome: Progressing   Problem: Self-Care: Goal: Ability to participate in self-care as condition permits will improve Outcome: Progressing Goal: Verbalization of feelings and concerns over difficulty with self-care will improve Outcome: Progressing Goal: Ability to communicate needs accurately will improve Outcome: Progressing   Problem: Nutrition: Goal: Risk of aspiration will decrease Outcome: Progressing Goal: Dietary intake will improve Outcome: Progressing   Problem: Education: Goal: Knowledge of General Education information will improve Description: Including pain rating scale, medication(s)/side effects and non-pharmacologic comfort measures Outcome: Progressing   Problem: Health Behavior/Discharge Planning: Goal: Ability to manage health-related needs will improve Outcome: Progressing   Problem: Clinical Measurements: Goal: Ability to maintain clinical measurements within normal limits will improve Outcome: Progressing Goal: Will remain free from infection Outcome: Progressing Goal: Diagnostic test results will improve Outcome: Progressing Goal: Respiratory complications will improve Outcome: Progressing Goal: Cardiovascular complication will be avoided Outcome: Progressing   Problem: Activity: Goal: Risk for activity intolerance will decrease Outcome: Progressing   Problem: Nutrition: Goal: Adequate nutrition will be maintained Outcome: Progressing   Problem: Coping: Goal: Level of anxiety will decrease Outcome: Progressing   Problem: Elimination: Goal: Will not experience complications related to bowel motility Outcome: Progressing Goal: Will not experience complications related to urinary retention Outcome: Progressing   Problem: Pain Managment: Goal: General experience of comfort will  improve and/or be controlled Outcome: Progressing   Problem: Safety: Goal: Ability to remain free from injury will improve Outcome: Progressing   Problem: Skin Integrity: Goal: Risk for impaired skin integrity will decrease Outcome: Progressing

## 2024-07-31 NOTE — Progress Notes (Signed)
 Speech Language Pathology Treatment: Dysphagia  Patient Details Name: Larry Briggs MRN: [id number] DOB: 11-06-1935 Today's Date: 07/31/2024 Time: 8384-8362 SLP Time Calculation (min) (ACUTE ONLY): 22 min  Assessment / Plan / Recommendation Clinical Impression  Will repeat MBS tomorrow morning prior to discharge for potential liquid upgrade.   Treatment focused on use of EMST for facilitation of swallow/respiratory mechanism. Needed mod-max cues for correct use to coordinate inhale, place lips around mouthpiece and puff/blow out with lower resistance setting. Performed 10 reps of chin tuck against resistance. Attempted to place dentures again with cream with improve fit initially however top plate fell from hard palate after 3 minutes. Will repeat MBS prior to pt leaving for ability to upgrade to thin. He will need to remain on puree as he consumed any foods with texture using dentures prior.    HPI HPI: Larry Briggs is a 88 y.o. male from home with weakness on the right side with difficulty ambulating. MRI  Acute/subacute nonhemorrhagic 9 mm infarct in the posterior limb of the left internal capsule with associated T2 and FLAIR hyperintensities. PMH: CVA 2025 no dysphagia per wife, CAD, hypertension, hyperlipidemia. Repeat MBS for possible improvement in swallow function      SLP Plan  MBS        Swallow Evaluation Recommendations   Recommendations: PO diet PO Diet Recommendation: Dysphagia 1 (Pureed);Mildly thick liquids (Level 2, nectar thick) Liquid Administration via: Cup;Straw Medication Administration: Whole meds with puree Supervision: Staff to assist with self-feeding;Full supervision/cueing for swallowing strategies Postural changes: Position pt fully upright for meals Oral care recommendations: Oral care BID (2x/day)     Recommendations                     Oral care BID   Frequent or constant Supervision/Assistance Dysphagia, oropharyngeal phase  (R13.12)     MBS     Dustin Olam Bull  07/31/2024, 4:57 PM

## 2024-07-31 NOTE — Progress Notes (Signed)
 Physical Therapy Treatment Patient Details Name: Larry Briggs MRN: [id number] DOB: 10-Aug-1935 Today's Date: 07/31/2024   History of Present Illness Larry Briggs is a 88 y.o. male who presented 07/18/24 with worsening right sided weakness and inability to walk. Of note, pt recently seen in ED yesterday (12/16) where MRI of the brain and lumbar spine were negative and he was d/c'd Home. Repeat MRI showed small 9 mm posterior limb internal capsule acute infarct. 12/22 became agitated ?delirium PMHx: CAD, HTN, and HLD.    PT Comments  Pt with fair tolerance to treatment today. Co-treat with OT. Pt today was able to stand twice in stedy with +2 Max A however was noted to fatigue very quickly. Once returned to bed pt able to perform bed level exercises. No change in DC/DME recs at this time. PT will continue to follow.     If plan is discharge home, recommend the following: Two people to help with walking and/or transfers;Assistance with cooking/housework;Assist for transportation;Help with stairs or ramp for entrance;Supervision due to cognitive status;Direct supervision/assist for medications management;Two people to help with bathing/dressing/bathroom   Can travel by private vehicle     No  Equipment Recommendations  Other (comment)    Recommendations for Other Services       Precautions / Restrictions Precautions Precautions: Fall Recall of Precautions/Restrictions: Impaired Precaution/Restrictions Comments: SBP < 160 Restrictions Weight Bearing Restrictions Per Provider Order: No     Mobility  Bed Mobility Overal bed mobility: Needs Assistance Bed Mobility: Rolling, Sidelying to Sit, Sit to Supine Rolling: Mod assist Sidelying to sit: Min assist   Sit to supine: Max assist   General bed mobility comments: increased assistance to return to supine due to fatigue    Transfers Overall transfer level: Needs assistance Equipment used: Rolling walker (2 wheels),  Ambulation equipment used Transfers: Sit to/from Stand Sit to Stand: +2 safety/equipment, Max assist, From elevated surface           General transfer comment: patient stood from EOB into RW with max assist +2, demonstrating trunk flexion and quickly fatigues.  Stood from EOB into Tucker with max assit +2 and another stand from [employer] via Lift Equipment: Stedy  Ambulation/Gait               General Gait Details: Unable at this time.   Stairs             Wheelchair Mobility     Tilt Bed    Modified Rankin (Stroke Patients Only)       Balance Overall balance assessment: Needs assistance Sitting-balance support: Bilateral upper extremity supported, Feet supported Sitting balance-Leahy Scale: Poor Sitting balance - Comments: min assist Postural control: Posterior lean, Right lateral lean Standing balance support: Bilateral upper extremity supported, During functional activity, Reliant on assistive device for balance Standing balance-Leahy Scale: Poor Standing balance comment: reliant on RW and Stedy for support                            Communication Communication Communication: Impaired Factors Affecting Communication: Difficulty expressing self;Reduced clarity of speech  Cognition Arousal: Alert Behavior During Therapy: Flat affect                             Following commands: Impaired Following commands impaired: Follows one step commands inconsistently, Follows one step commands with increased time    Cueing Cueing  Techniques: Verbal cues, Tactile cues  Exercises General Exercises - Lower Extremity Ankle Circles/Pumps: AROM, Both, 10 reps, Supine Heel Slides: AAROM, Both, 10 reps, Supine Straight Leg Raises: AAROM, Both, 10 reps, Supine    General Comments General comments (skin integrity, edema, etc.): VSS      Pertinent Vitals/Pain Pain Assessment Pain Assessment: Faces Faces Pain Scale: Hurts a little  bit Pain Location: low back Pain Descriptors / Indicators: Grimacing, Moaning Pain Intervention(s): Limited activity within patient's tolerance, Monitored during session, Repositioned    Home Living                          Prior Function            PT Goals (current goals can now be found in the care plan section) Progress towards PT goals: Progressing toward goals    Frequency    Min 3X/week      PT Plan      Co-evaluation              AM-PAC PT 6 Clicks Mobility   Outcome Measure  Help needed turning from your back to your side while in a flat bed without using bedrails?: A Lot Help needed moving from lying on your back to sitting on the side of a flat bed without using bedrails?: A Lot Help needed moving to and from a bed to a chair (including a wheelchair)?: A Lot Help needed standing up from a chair using your arms (e.g., wheelchair or bedside chair)?: A Lot Help needed to walk in hospital room?: Total Help needed climbing 3-5 steps with a railing? : Total 6 Click Score: 10    End of Session Equipment Utilized During Treatment: Gait belt Activity Tolerance: Patient tolerated treatment well Patient left: in bed;with call bell/phone within reach;with bed alarm set;with nursing/sitter in room;with family/visitor present Nurse Communication: Mobility status;Need for lift equipment PT Visit Diagnosis: Hemiplegia and hemiparesis;Other symptoms and signs involving the nervous system (R29.898);Other abnormalities of gait and mobility (R26.89);Unsteadiness on feet (R26.81) Hemiplegia - Right/Left: Right Hemiplegia - dominant/non-dominant: Dominant Hemiplegia - caused by: Cerebral infarction     Time: 1112-1139 PT Time Calculation (min) (ACUTE ONLY): 27 min  Charges:    $Therapeutic Activity: 8-22 mins PT General Charges $$ ACUTE PT VISIT: 1 Visit                     Cobi Aldape B, PT, DPT Acute Rehab Services [phone]    Sharlene Mccluskey 07/31/2024, 1:39 PM

## 2024-07-31 NOTE — Progress Notes (Signed)
 "        [facility]                                                                               [name], is a 88 y.o. male, DOB - 04-07-1936, FMW:[id number] Admit date - 07/18/2024    Outpatient Primary MD for the patient is Larry Nottingham, MD  LOS - 13  days    Brief summary   Larry Briggs is a 88 y.o. male with a history of CAD, hypertension, hyperlipidemia.  Patient presented secondary to extremity weakness and was found to have an acute stroke. Hospitalization complicated by dysphagia, from stroke, requiring cortrak placement and tube feeding and hospital delirium requiring the use of Geodon  and seroquel .  SLP eval done on 12/26 recommending DYS 1 diet with nectar thick fluids.  After we stopped the tube feeds his oral intake has improved later on discontinued the cortrak.  PMT on board and following the patient. Recommend outpatient follow up with palliative at SNF.  Patients bilateral arm wounds are improving.  Plan for SNF when bed available.    Assessment & Plan    Assessment and Plan:   Acute stroke MRI brain (12/17) confirms acute/subacute nonhemorrhagic 9 mm infarct in the posterior limb of the left internal capsule. MRA head significant for no large vessel occlusion or high grade stenosis. LDL of 39. Hemoglobin A1C of 5.4%. Transthoracic Echocardiogram significant for LVEF of 60-65% with indeterminate diastolic parameters and  no atrial level shunt. Neurology recommendations for continuing Xarelto . PT/OT recommendations are for acute inpatient rehabilitation but as pt did not make any progress, he is not a candidate for CIR anymore.  Aspirin  discontinued per neurology recommendations and transitioned to xarelto  for secondary stroke prevention.    Agitation Confusion Possibly related to stroke vs hospital delirium. Improved after Geodon , but now has recurrent symptoms which are on bay with seroquel .  Patient on seroquel  at 12.5 mg daily at 6 pm.  His  mental status has improved. He is more alert and conversing.    Primary hypertension Well controlled.  Continue with metoprolol  12.5 mg BID.    CAD Currently denies any chest pain.    Paroxysmal atrial fibrillation Noted on EKG. Patient has a known history of atrial fibrillation. Patient also with frequent PVCs. Echo this admission with normal LVEF and normal atria. Patient follows with Dr. Jordan as an outpatient. He is on Xarelto  and Toprol  XL as an outpatient; -Continue Xarelto  and Toprol  XL.   Ileus Noted on abdominal x-ray. Patient without abdominal pain, nausea or vomiting. Patient is having bowel movements.    Dysphagia Related to acute stroke. Patient seen and evaluated by speech therapy with recommendation for NPO. Cortrak NG tube placed on 12/19 and tube feeds started. SLP re evaluated on 12/26 and recommended dysphagia 1 diet with nectar thick liquids.  His oral intake is slowly improving. We will d/c cortrak today and monitor.   Meanwhile resume supplementation with prosource, free water  . In view of his dysphagia and debility PMT consulted, following him,.   Severe malnutrition Supplementation on board.    Pericardial effusion Noted on echo. Noted as small in volume with evidence  of diastolic collapse of the right ventricular free wall without evidence of cardiac tamponade.   Anemia of chronic disease: Hemoglobin around 10 .   Mild leukocytosis Unclear etiology. Monitor.   Mild abdominal distention:  ABD xray shows ileus.  Recommend keeping K >4 and magnesium  >2.   Bilateral upper extremity skin tears and wounds -as per the wife the wounds are improving. Patient was seen by wound care earlier in the admission.  Silvadene  cream ordered.     Malnutrition Type:  Nutrition Problem: Severe Malnutrition Etiology: social / environmental circumstances (functional decline related to aging)   Malnutrition Characteristics:  Signs/Symptoms: severe fat  depletion, severe muscle depletion   Nutrition Interventions:  Interventions: Ensure Enlive (each supplement provides 350kcal and 20 grams of protein), Magic cup, MVI  Estimated body mass index is 23.76 kg/m as calculated from the following:   Height as of this encounter: 5' 10 (1.778 m).   Weight as of this encounter: 75.1 kg.  Code Status: full code.  DVT Prophylaxis:  SCD's Start: 07/18/24 2339 Rivaroxaban  (XARELTO ) tablet 15 mg   Level of Care: Level of care: Med-Surg Family Communication: wife at bedside.  Disposition Plan:     Remains inpatient appropriate:  pending.possibly SNF when bed available.   Procedures:  None.   Consultants:   SLP Neurology.   Antimicrobials:   Anti-infectives (From admission, onward)    None        Medications  Scheduled Meds:   stroke: early stages of recovery book   Does not apply Once   Chlorhexidine  Gluconate Cloth  6 each Topical Daily   cyanocobalamin   1,000 mcg Per Tube Daily   feeding supplement  237 mL Oral BID BM   metoprolol  succinate  25 mg Oral Daily   multivitamin with minerals  1 tablet Oral Daily   mouth rinse  15 mL Mouth Rinse 4 times per day   Polyethyl Glycol-Propyl Glycol  1 drop Both Eyes BID   QUEtiapine   12.5 mg Per Tube QHS   rivaroxaban   15 mg Per Tube Q supper   silver  sulfADIAZINE    Topical Daily   simvastatin   40 mg Per Tube q1800   Continuous Infusions:   PRN Meds:.acetaminophen  **OR** acetaminophen  (TYLENOL ) oral liquid 160 mg/5 mL **OR** acetaminophen , albuterol , ondansetron  (ZOFRAN ) IV, mouth rinse    Subjective:   Larry Briggs was seen and examined today.  Only 1 bm last night. No new events.   Objective:   Vitals:   07/31/24 0500 07/31/24 0903 07/31/24 1111 07/31/24 1536  BP:  (!) 138/49 (!) 118/50 (!) 160/108  Pulse:  99 95 95  Resp:  18 18 18   Temp:  97.8 F (36.6 C) 98 F (36.7 C) 97.7 F (36.5 C)  TempSrc:  Oral Oral Oral  SpO2:  99% 98% 100%  Weight: 75.1 kg      Height:        Intake/Output Summary (Last 24 hours) at 07/31/2024 1641 Last data filed at 07/31/2024 0900 Gross per 24 hour  Intake 240 ml  Output 450 ml  Net -210 ml   Filed Weights   07/29/24 0500 07/30/24 0500 07/31/24 0500  Weight: 73.9 kg 75.2 kg 75.1 kg     Exam General exam: Ill appearing gentleman, not in distress.  Respiratory system: Clear to auscultation. Respiratory effort normal. Cardiovascular system: S1 & S2 heard, RRR.  Gastrointestinal system: Abdomen is soft bs+ Central nervous system: Alert and oriented to person.  Extremities: upper extremity wounds bandaged.  Skin: No rashes,  Psychiatry: confused.        Data Reviewed:  I have personally reviewed following labs and imaging studies   CBC Lab Results  Component Value Date   WBC 10.7 (H) 07/30/2024   RBC 3.24 (L) 07/30/2024   HGB 9.3 (L) 07/30/2024   HCT 29.7 (L) 07/30/2024   MCV 91.7 07/30/2024   MCH 28.7 07/30/2024   PLT 430 (H) 07/30/2024   MCHC 31.3 07/30/2024   RDW 14.8 07/30/2024   LYMPHSABS 0.8 07/30/2024   MONOABS 0.8 07/30/2024   EOSABS 0.2 07/30/2024   BASOSABS 0.1 07/30/2024     Last metabolic panel Lab Results  Component Value Date   NA 142 07/30/2024   K 3.7 07/30/2024   CL 106 07/30/2024   CO2 27 07/30/2024   BUN 44 (H) 07/30/2024   CREATININE 1.01 07/30/2024   GLUCOSE 104 (H) 07/30/2024   GFRNONAA >60 07/30/2024   GFRAA 77 07/10/2020   CALCIUM  8.3 (L) 07/30/2024   PHOS 3.2 07/30/2024   PROT 6.3 (L) 07/18/2024   ALBUMIN  4.1 07/18/2024   LABGLOB 2.6 07/10/2020   AGRATIO 1.6 07/10/2020   BILITOT 1.0 07/18/2024   ALKPHOS 90 07/18/2024   AST 20 07/18/2024   ALT 8 07/18/2024   ANIONGAP 10 07/30/2024    CBG (last 3)  Recent Labs    07/31/24 0753 07/31/24 1145 07/31/24 1612  GLUCAP 93 189* 157*      Coagulation Profile: No results for input(s): INR, PROTIME in the last 168 hours.   Radiology Studies: No results found.        Elgie Butter M.D. [facility] 07/31/2024, 4:41 PM  Available via Epic secure chat 7am-7pm After 7 pm, please refer to night coverage provider listed on amion.    "

## 2024-07-31 NOTE — TOC Progression Note (Signed)
 Transition of Care [facility]) - Progression Note    Patient Details  Name: Larry Briggs MRN: [id number] Date of Birth: Jul 16, 1936  [facility] CM/SW Contact  Almarie CHRISTELLA Goodie, [place] Phone Number: 07/31/2024, 2:19 PM  Clinical Narrative:   CSW following for disposition. CSW coordinated with [facility] and patient's spouse for a tour, and confirmed with spouse that they would like to accept bed offer at [facility]. [facility] will have a bed tomorrow if patient is stable. CSW to follow.    Expected Discharge Plan: Skilled Nursing Facility Barriers to Discharge: Continued Medical Work up               Expected Discharge Plan and Services   Discharge Planning Services: CM Consult Post Acute Care Choice: IP Rehab Living arrangements for the past 2 months: Single Family Home                                       Social Drivers of Health (SDOH) Interventions SDOH Screenings   Food Insecurity: No Food Insecurity (07/22/2024)  Housing: Low Risk (07/22/2024)  Transportation Needs: No Transportation Needs (07/22/2024)  Utilities: Not At Risk (07/22/2024)  Social Connections: Unknown (07/22/2024)  Tobacco Use: Medium Risk (07/18/2024)    Readmission Risk Interventions     No data to display

## 2024-08-01 ENCOUNTER — Inpatient Hospital Stay (HOSPITAL_COMMUNITY)

## 2024-08-01 LAB — GLUCOSE, CAPILLARY
Glucose-Capillary: 100 mg/dL — ABNORMAL HIGH (ref 70–99)
Glucose-Capillary: 116 mg/dL — ABNORMAL HIGH (ref 70–99)
Glucose-Capillary: 120 mg/dL — ABNORMAL HIGH (ref 70–99)

## 2024-08-01 MED ORDER — QUETIAPINE FUMARATE 25 MG PO TABS: 12.5000 mg | ORAL_TABLET | Freq: Every day | ORAL | Status: AC

## 2024-08-01 MED ORDER — TAMSULOSIN HCL 0.4 MG PO CAPS
0.4000 mg | ORAL_CAPSULE | Freq: Every day | ORAL | Status: DC
  Administered 2024-08-01: 0.4 mg via ORAL
  Filled 2024-08-01: qty 1

## 2024-08-01 MED ORDER — ENSURE PLUS HIGH PROTEIN PO LIQD: 237.0000 mL | Freq: Two times a day (BID) | ORAL | Status: AC

## 2024-08-01 NOTE — TOC Transition Note (Signed)
 Transition of Care [facility]) - Discharge Note   Patient Details  Name: Larry Briggs MRN: [id number] Date of Birth: July 08, 1936  [facility] CM/SW Contact:  Almarie CHRISTELLA Goodie, LCSW Phone Number: 08/01/2024, 11:40 AM   Clinical Narrative:   CSW confirmed with MD that patient is medically stable and confirmed with [facility] that bed is available today. CSW sent discharge information to [facility], confirmed receipt. CSW met with spouse, Devere, at bedside and she is in agreement. Transport arranged with [facility] for next available.  Nurse to call report to [phone], Room 505.    Final next level of care: Skilled Nursing Facility Barriers to Discharge: Barriers Resolved   Patient Goals and CMS Choice   CMS Medicare.gov Compare Post Acute Care list provided to:: Patient Represenative (must comment) Choice offered to / list presented to : Spouse, Patient, Adult Children      Discharge Placement              Patient chooses bed at: [facility] Patient to be transferred to facility by: [facility] Name of family member notified: Devere Patient and family notified of of transfer: 08/01/24  Discharge Plan and Services Additional resources added to the After Visit Summary for     Discharge Planning Services: CM Consult Post Acute Care Choice: IP Rehab                               Social Drivers of Health (SDOH) Interventions SDOH Screenings   Food Insecurity: No Food Insecurity (07/22/2024)  Housing: Low Risk (07/22/2024)  Transportation Needs: No Transportation Needs (07/22/2024)  Utilities: Not At Risk (07/22/2024)  Social Connections: Unknown (07/22/2024)  Tobacco Use: Medium Risk (07/18/2024)     Readmission Risk Interventions     No data to display

## 2024-08-01 NOTE — Plan of Care (Signed)
   Problem: Education: Goal: Knowledge of disease or condition will improve Outcome: Progressing Goal: Knowledge of secondary prevention will improve (MUST DOCUMENT ALL) Outcome: Progressing Goal: Knowledge of patient specific risk factors will improve (DELETE if not current risk factor) Outcome: Progressing   Problem: Ischemic Stroke/TIA Tissue Perfusion: Goal: Complications of ischemic stroke/TIA will be minimized Outcome: Progressing   Problem: Coping: Goal: Will verbalize positive feelings about self Outcome: Progressing Goal: Will identify appropriate support needs Outcome: Progressing   Problem: Health Behavior/Discharge Planning: Goal: Ability to manage health-related needs will improve Outcome: Progressing Goal: Goals will be collaboratively established with patient/family Outcome: Progressing   Problem: Self-Care: Goal: Ability to participate in self-care as condition permits will improve Outcome: Progressing Goal: Verbalization of feelings and concerns over difficulty with self-care will improve Outcome: Progressing Goal: Ability to communicate needs accurately will improve Outcome: Progressing   Problem: Nutrition: Goal: Risk of aspiration will decrease Outcome: Progressing Goal: Dietary intake will improve Outcome: Progressing   Problem: Education: Goal: Knowledge of General Education information will improve Description: Including pain rating scale, medication(s)/side effects and non-pharmacologic comfort measures Outcome: Progressing   Problem: Health Behavior/Discharge Planning: Goal: Ability to manage health-related needs will improve Outcome: Progressing   Problem: Clinical Measurements: Goal: Ability to maintain clinical measurements within normal limits will improve Outcome: Progressing Goal: Will remain free from infection Outcome: Progressing Goal: Diagnostic test results will improve Outcome: Progressing Goal: Respiratory complications will  improve Outcome: Progressing Goal: Cardiovascular complication will be avoided Outcome: Progressing   Problem: Activity: Goal: Risk for activity intolerance will decrease Outcome: Progressing   Problem: Nutrition: Goal: Adequate nutrition will be maintained Outcome: Progressing   Problem: Coping: Goal: Level of anxiety will decrease Outcome: Progressing   Problem: Elimination: Goal: Will not experience complications related to bowel motility Outcome: Progressing Goal: Will not experience complications related to urinary retention Outcome: Progressing   Problem: Pain Managment: Goal: General experience of comfort will improve and/or be controlled Outcome: Progressing   Problem: Safety: Goal: Ability to remain free from injury will improve Outcome: Progressing   Problem: Skin Integrity: Goal: Risk for impaired skin integrity will decrease Outcome: Progressing   Problem: Education: Goal: Knowledge of disease or condition will improve Outcome: Progressing Goal: Knowledge of secondary prevention will improve (MUST DOCUMENT ALL) Outcome: Progressing Goal: Knowledge of patient specific risk factors will improve (DELETE if not current risk factor) Outcome: Progressing   Problem: Ischemic Stroke/TIA Tissue Perfusion: Goal: Complications of ischemic stroke/TIA will be minimized Outcome: Progressing   Problem: Coping: Goal: Will verbalize positive feelings about self Outcome: Progressing Goal: Will identify appropriate support needs Outcome: Progressing   Problem: Health Behavior/Discharge Planning: Goal: Ability to manage health-related needs will improve Outcome: Progressing Goal: Goals will be collaboratively established with patient/family Outcome: Progressing   Problem: Self-Care: Goal: Ability to participate in self-care as condition permits will improve Outcome: Progressing Goal: Verbalization of feelings and concerns over difficulty with self-care will  improve Outcome: Progressing Goal: Ability to communicate needs accurately will improve Outcome: Progressing   Problem: Nutrition: Goal: Risk of aspiration will decrease Outcome: Progressing Goal: Dietary intake will improve Outcome: Progressing

## 2024-08-01 NOTE — Progress Notes (Signed)
 Modified Barium Swallow Study  Patient Details  Name: Larry Briggs MRN: [id number] Date of Birth: September 01, 1935  Today's Date: 08/01/2024  Modified Barium Swallow completed.  Full report located under Chart Review in the Imaging Section.  History of Present Illness Larry Briggs is a 88 y.o. male from home with weakness on the right side with difficulty ambulating. MRI  Acute/subacute nonhemorrhagic 9 mm infarct in the posterior limb of the left internal capsule with associated T2 and FLAIR hyperintensities. PMH: CVA 2025 no dysphagia per wife, CAD, hypertension, hyperlipidemia. This is pt's 3rd MBS for possible upgrade to thin liquids prior to discharge.   Clinical Impression Pt exhibited improvements in swallow function from previous MBS without aspiration. Oral transport was brisk with reduced cohesion and mild floor of mouth and lingual residue. Intermittent penetration of thin (PAS 2, 3) and one instance of nectar (PAS 5) given mildly reduced anterior hyoid excursion, laryngeal elveation and incomplete glottic closure. Spontaneous subsequent swallows and cues to throat clear effectively and consistently cleared vestibule. Dentures ill fitting and solid not adminstered. Recommend continue puree, upgrade to thin liquids with throat clears frequently with liquids, allow time for second swallow or cue when needed, whole meds in puree and self feeding with cup recommended. Initially refrain from straws as penetration was greater and reintroduce in therapy at SNF. Discussed results and recommendations with wife who verbalized understanding. Factors that may increase risk of adverse event in presence of aspiration Larry Briggs 2021): Poor general health and/or compromised immunity;Limited mobility;Reduced cognitive function;Frail or deconditioned  Swallow Evaluation Recommendations Recommendations: PO diet PO Diet Recommendation: Dysphagia 1 (Pureed);Thin liquids (Level 0) Liquid  Administration via: Cup;No straw Medication Administration: Whole meds with puree Supervision: Staff to assist with self-feeding;Full supervision/cueing for swallowing strategies Swallowing strategies  : Slow rate;Small bites/sips;Multiple dry swallows after each bite/sip;Clear throat intermittently Postural changes: Position pt fully upright for meals Oral care recommendations: Oral care BID (2x/day)      Larry Briggs 08/01/2024,3:20 PM

## 2024-08-01 NOTE — Progress Notes (Signed)
 Report given to Satara, CHAR[facility] at [facility].

## 2024-08-01 NOTE — Discharge Summary (Addendum)
 " Physician Discharge Summary   Patient: Larry Briggs MRN: [id number] DOB: 03-Jan-1936  Admit date:     07/18/2024  Discharge date: 08/01/2024  Discharge Physician: Casimer Dare   PCP: [name], MD   Recommendations at discharge:    Continue seroquel  12.5 mg at 6 pm. Montior Qtc intermittently while on azithromycin  (chronic fro COPD) also. Wean as appropriate Monitor urine output, is incontinent Follow up with neurology  Voiding trial in a few days   Discharge Diagnoses: Principal Problem:   CVA (cerebral vascular accident) [facility]) Active Problems:   Right sided weakness   Protein-calorie malnutrition, severe   Hospital Course: 88 y.o. male with a history of CAD, hypertension, hyperlipidemia.  Patient presented secondary to extremity weakness and was found to have an acute stroke. Hospitalization complicated by dysphagia from stroke, requiring cortrak placement and tube feeding and hospital delirium requiring the use of Geodon  and seroquel . SLP eval done on 12/26 recommending DYS 1 diet with nectar thick fluids and now advanced to thin liquids. Oral intake improved and tube feeds stopped.  PMT on board and following the patient. Recommend outpatient follow up with palliative at SNF.  Patients bilateral arm wounds are improving.  Assessment and Plan:  Acute stroke MRI brain (12/17) confirms acute/subacute nonhemorrhagic 9 mm infarct in the posterior limb of the left internal capsule. MRA head significant for no large vessel occlusion or high grade stenosis. LDL of 39. Hemoglobin A1C of 5.4%. Transthoracic Echocardiogram significant for LVEF of 60-65% with indeterminate diastolic parameters and  no atrial level shunt. Neurology recommendations for continuing Xarelto . PT/OT recommendations are for acute inpatient rehabilitation but as pt did not make any progress, he is not a candidate for CIR anymore.  Aspirin  discontinued per neurology recommendations and transitioned to xarelto  for  secondary stroke prevention.  - follow up with neurology as outpatient    Agitation, Confusion Possibly related to stroke vs hospital delirium. - improved with seroquel , more alert and conversing  - continue seroquel  12.5 mg at 6 pm - monitor Qtc intermittently while also on chronic azithromycin      Primary hypertension Well controlled.  Continue with metoprolol  12.5 mg BID. Amlodipine  also resumed    CAD Currently denies any chest pain.    Paroxysmal atrial fibrillation Noted on EKG. Patient has a known history of atrial fibrillation. Patient also with frequent PVCs. Echo this admission with normal LVEF and normal atria. Patient follows with Dr. Jordan as an outpatient. He is on Xarelto  and Toprol  XL as an outpatient; -Continue Xarelto  and Toprol  XL.    Ileus Noted on abdominal x-ray. Patient without abdominal pain, nausea or vomiting. Patient is having bowel movements.   Dysphagia Related to acute stroke. Patient seen and evaluated by speech therapy with recommendation for NPO initially. He improved and is now on dysphagia 1 diet with thin liquids    Severe malnutrition Supplementation on board.    Pericardial effusion Noted on echo. Noted as small in volume with evidence of diastolic collapse of the right ventricular free wall without evidence of cardiac tamponade.   Anemia of chronic disease: Hemoglobin around 10 .    Mild leukocytosis Unclear etiology, resolved   Mild abdominal distention:  ABD xray shows ileus.  Recommend keeping K >4 and magnesium  >2.    Bilateral upper extremity skin tears and wounds - as per the wife the wounds are improving. Patient was seen by wound care earlier in the admission.  - Silvadene  cream as needed   BPH -  had some urinary retention after Foley removal but then had an incontinent episode . Bladder scans done and showed 250 mL - encourage to sit on commode - his flomax  was held while in the hospital (couldn't be given crushed via  feeding tube), now resumed and should help with retention. Needed a Foley at the time of discharge and will need another voiding trial at rehab      Malnutrition Type: Nutrition Problem: Severe Malnutrition Etiology: social / environmental circumstances (functional decline related to aging) Signs/Symptoms: severe fat depletion, severe muscle depletion Interventions: Ensure Enlive (each supplement provides 350kcal and 20 grams of protein), Magic cup Estimated body mass index is 23.76 kg/m as calculated from the following:   Height as of this encounter: 5' 10 (1.778 m).   Weight as of this encounter: 75.1 kg.      Consultants: Neurology  Procedures performed: None  Disposition: Skilled nursing facility Diet recommendation:  Dysphagia type 1 Thin Liquid DISCHARGE MEDICATION: Allergies as of 08/01/2024       Reactions   Viagra [sildenafil]    Other reaction(s): blue haze   Iodinated Contrast Media Nausea And Vomiting, Rash        Medication List     PAUSE taking these medications    chlorthalidone  25 MG tablet Wait to take this until your doctor or other care provider tells you to start again. Commonly known as: HYGROTON  TAKE 1 TABLET EVERY DAY       STOP taking these medications    methylPREDNISolone  4 MG Tbpk tablet Commonly known as: MEDROL  DOSEPAK   potassium chloride  SA 20 MEQ tablet Commonly known as: Klor-Con  M20       TAKE these medications    acetaminophen  500 MG tablet Commonly known as: TYLENOL  Take 500 mg by mouth at bedtime.   amLODipine  5 MG tablet Commonly known as: NORVASC  TAKE 1 TABLET EVERY DAY   azithromycin  250 MG tablet Commonly known as: ZITHROMAX  TAKE 1 TABLET 3 TIMES A WEEK ON MONDAY, WEDNESDAY, AND FRIDAY.   cyanocobalamin  1000 MCG tablet Commonly known as: VITAMIN B12 Take 1,000 mcg by mouth daily.   docusate sodium  100 MG capsule Commonly known as: COLACE Take 100 mg by mouth as needed.   Ester-C 500-550 MG  Tabs Take 500 mg by mouth daily.   feeding supplement Liqd Take 237 mLs by mouth 2 (two) times daily between meals.   Fish Oil 500 MG Caps Take 1 capsule by mouth daily at 6 (six) AM.   Flutter Devi Use as directed   guaiFENesin 600 MG 12 hr tablet Commonly known as: MUCINEX Take 600 mg by mouth 2 (two) times daily as needed for to loosen phlegm.   metoprolol  succinate 25 MG 24 hr tablet Commonly known as: TOPROL -XL TAKE 1 TABLET EVERY DAY   nitroGLYCERIN  0.4 MG SL tablet Commonly known as: Nitrostat  Place 1 tablet (0.4 mg total) under the tongue as needed.   QUEtiapine  25 MG tablet Commonly known as: SEROQUEL  Take 0.5 tablets (12.5 mg total) by mouth at bedtime. Take at 6 pm   Rivaroxaban  15 MG Tabs tablet Commonly known as: XARELTO  Take 1 tablet (15 mg total) by mouth daily.   silver  sulfADIAZINE  1 % cream Commonly known as: SILVADENE  Apply 1 Application topically daily as needed.   simvastatin  40 MG tablet Commonly known as: ZOCOR  TAKE 1 TABLET (40 MG TOTAL) BY MOUTH DAILY AT 6 PM.   SYSTANE ULTRA OP Apply 1 drop to eye 2 (two) times daily.  tamsulosin  0.4 MG Caps capsule Commonly known as: FLOMAX  Take 0.4 mg by mouth daily after supper.   Trelegy Ellipta  100-62.5-25 MCG/ACT Aepb Generic drug: Fluticasone -Umeclidin-Vilant Inhale 1 puff into the lungs daily.   Ventolin  HFA 108 (90 Base) MCG/ACT inhaler Generic drug: albuterol  INHALE 2 PUFFS BY MOUTH EVERY 4 HOURS AS NEEDED FOR WHEEZING OR SHORTNESS OF BREATH        Contact information for after-discharge care     Destination     [facility] .   Service: Skilled Nursing Contact information: [address] [place]  [address] [phone]                    Discharge Exam: Filed Weights   07/29/24 0500 07/30/24 0500 07/31/24 0500  Weight: 73.9 kg 75.2 kg 75.1 kg   Physical Exam Vitals and nursing note reviewed.  Constitutional:      General: He is not in acute  distress. Cardiovascular:     Rate and Rhythm: Normal rate.  Pulmonary:     Effort: No respiratory distress.  Abdominal:     General: There is no distension.     Tenderness: There is no abdominal tenderness.  Neurological:     Comments: Awake and alert, 5/5 strength in b/l upper extremities       Condition at discharge: stable  The results of significant diagnostics from this hospitalization (including imaging, microbiology, ancillary and laboratory) are listed below for reference.   Imaging Studies: DG Abd Portable 1V Result Date: 07/29/2024 CLINICAL DATA:  Abdominal distension. EXAM: PORTABLE ABDOMEN - 1 VIEW COMPARISON:  Radiograph 07/24/2024 FINDINGS: Weighted enteric tube in the region of the distal stomach. Gaseous distension of bowel loops, primarily colon. Enteric contrast within the rectosigmoid colon. Enteric contrast in the right abdomen may be within few prominent small bowel loops or ascending colon. No evidence of free air. IMPRESSION: Gaseous distension of bowel loops, primarily colon. Residual enteric contrast within the rectosigmoid colon. Residual enteric contrast in the right abdomen may be within few prominent small bowel loops or ascending colon. Findings are suggestive of ileus. Electronically Signed   By: Andrea Gasman M.D.   On: 07/29/2024 18:36   DG Swallowing Func-Speech Pathology Result Date: 07/27/2024 Table formatting from the original result was not included. Modified Barium Swallow Study Patient Details Name: Larry Briggs MRN: [id number] Date of Birth: [birth year] Today's Date: 07/27/2024 HPI/PMH: HPI: GABRIAL DOMINE is a 88 y.o. male from home with weakness on the right side with difficulty ambulating. MRI  Acute/subacute nonhemorrhagic 9 mm infarct in the posterior limb of the left internal capsule with associated T2 and FLAIR hyperintensities. PMH: CVA 2025 no dysphagia per wife, CAD, hypertension, hyperlipidemia. Repeat MBS for possible improvement in  swallow function Clinical Impression: Clinical Impression: Pt's alertness/awareness and oropharyngeal swallow function improved from previous study exhibiting mild oropharyngeal dysphagia with trace aspiration of thin and nectar consistency. Pt had consistent mild oral lingual and occasional floor of mouth residue and brisk lingual transport of boluses. Aspiration resulted from  decreased laryngeal mobility with penentration that was aspirated (PAS 8) with subsequent swallow. Reduced timing with thin led to swallow initiating as barium entered laryngeal vestibule and aspirated during the swallow silently. Cued coughs ineffective in clearing trachea and may have reduced but not cleared vestibule penetration. Additional compensatory strategies not attempted as implementation with cues suspected to be ineffective. Recommend Dys 1 (puree), nectar thick liquids, frequent cued throat clears, small sips, pills crushed and full supervision.  Educated wife pt may have transient aspiration with recommendations and voiced understanding and in agreement with recommendations. Disussed findings with MD. ST to continue to follow. Factors that may increase risk of adverse event in presence of aspiration [name] & [name] 2021): Factors that may increase risk of adverse event in presence of aspiration [name] & [name] 2021): Reduced cognitive function; Limited mobility; Frail or deconditioned; Dependence for feeding and/or oral hygiene Recommendations/Plan: Swallowing Evaluation Recommendations Swallowing Evaluation Recommendations Recommendations: PO diet PO Diet Recommendation: Dysphagia 1 (Pureed); Mildly thick liquids (Level 2, nectar thick) Liquid Administration via: Cup; Straw Medication Administration: Crushed with puree Supervision: Staff to assist with self-feeding; Full supervision/cueing for swallowing strategies Swallowing strategies  : Slow rate; Small bites/sips; Check for pocketing or oral holding; Clear throat  intermittently Postural changes: Position pt fully upright for meals Oral care recommendations: Oral care BID (2x/day) Treatment Plan Treatment Plan Treatment recommendations: Therapy as outlined in treatment plan below Follow-up recommendations: Skilled nursing-short term rehab (<3 hours/day) Functional status assessment: Patient has had a recent decline in their functional status and demonstrates the ability to make significant improvements in function in a reasonable and predictable amount of time. Treatment frequency: Min 2x/week Treatment duration: 2 weeks Interventions: Aspiration precaution training; Patient/family education; Compensatory techniques; Diet toleration management by SLP Recommendations Recommendations for follow up therapy are one component of a multi-disciplinary discharge planning process, led by the attending physician.  Recommendations may be updated based on patient status, additional functional criteria and insurance authorization. Assessment: Orofacial Exam: Orofacial Exam Oral Cavity: Oral Hygiene: Xerostomia Oral Cavity - Dentition: Other (Comment) (wears dentures at baseline however not functional - do not fit with denture cream) Oral Motor/Sensory Function: -- (appears to have slight left labial weakness although stroke affected right side, general weakness) Anatomy: Anatomy: Other (Comment) (kyphsis) Boluses Administered: Boluses Administered Boluses Administered: Thin liquids (Level 0); Mildly thick liquids (Level 2, nectar thick); Moderately thick liquids (Level 3, honey thick); Puree  Oral Impairment Domain: Oral Impairment Domain Lip Closure: Escape from interlabial space or lateral juncture, no extension beyond vermillion border (had escape due to SLP removing cup too soon) Tongue control during bolus hold: Escape to lateral buccal cavity/floor of mouth Bolus preparation/mastication: -- (NT) Bolus transport/lingual motion: Brisk tongue motion Oral residue: Residue collection on  oral structures Location of oral residue : Floor of mouth; Tongue Initiation of pharyngeal swallow : Posterior laryngeal surface of the epiglottis  Pharyngeal Impairment Domain: Pharyngeal Impairment Domain Soft palate elevation: No bolus between soft palate (SP)/pharyngeal wall (PW) Laryngeal elevation: Partial superior movement of thyroid  cartilage/partial approximation of arytenoids to epiglottic petiole Anterior hyoid excursion: Partial anterior movement Epiglottic movement: Complete inversion Laryngeal vestibule closure: Incomplete, narrow column air/contrast in laryngeal vestibule Pharyngeal stripping wave : Present - diminished Pharyngeal contraction (A/P view only): N/A Pharyngoesophageal segment opening: Partial distention/partial duration, partial obstruction of flow Tongue base retraction: Trace column of contrast or air between tongue base and PPW Pharyngeal residue: Collection of residue within or on pharyngeal structures (mild) Location of pharyngeal residue: Valleculae  Esophageal Impairment Domain: Esophageal Impairment Domain Esophageal clearance upright position: Esophageal retention (mild) Pill: No data recorded Penetration/Aspiration Scale Score: Penetration/Aspiration Scale Score 1.  Material does not enter airway: Puree; Moderately thick liquids (Level 3, honey thick) 2.  Material enters airway, remains ABOVE vocal cords then ejected out: Mildly thick liquids (Level 2, nectar thick); Thin liquids (Level 0) 8.  Material enters airway, passes BELOW cords without attempt by patient to eject out (silent aspiration) :  Mildly thick liquids (Level 2, nectar thick); Thin liquids (Level 0) (trace amount) Compensatory Strategies: Compensatory Strategies Compensatory strategies: Yes Straw: Effective Effective Straw: Mildly thick liquid (Level 2, nectar thick)   General Information: Caregiver present: No  Diet Prior to this Study: NPO; Cortrak/Small bore NG tube   Temperature : Normal   Respiratory Status:  WFL   Supplemental O2: None (Room air)   History of Recent Intubation: No  Behavior/Cognition: Alert; Cooperative; Pleasant mood; Requires cueing Self-Feeding Abilities: Needs assist with self-feeding Baseline vocal quality/speech: Normal Volitional Cough: Able to elicit Volitional Swallow: Able to elicit Exam Limitations: No limitations Goal Planning: Prognosis for improved oropharyngeal function: -- (fair-good) Barriers to Reach Goals: Cognitive deficits No data recorded No data recorded Consulted and agree with results and recommendations: Family member/caregiver; Patient; Physician; Nurse Pain: Pain Assessment Pain Assessment: No/denies pain End of Session: Start Time:SLP Start Time (ACUTE ONLY): 1207 Stop Time: SLP Stop Time (ACUTE ONLY): 1220 Time Calculation:SLP Time Calculation (min) (ACUTE ONLY): 13 min Charges: SLP Evaluations $ SLP Speech Visit: 1 Visit SLP Evaluations $MBS Swallow: 1 Procedure SLP visit diagnosis: SLP Visit Diagnosis: Dysphagia, oropharyngeal phase (R13.12) Past Medical History: Past Medical History: Diagnosis Date  CAD (coronary artery disease)   Remote MI in 1980. CABG 1995. Stent to left main in 2004. Last cath in April of 2012. EF 50%; patent LIMA to DX/LAD with collateralization of the distal right, patent SVG to OM, occluded SVG to distal RCA, and patent stent to LAD  History of heart attack 1980  Hypercholesteremia   Hypertension   Increased glucose level   Obesity  Past Surgical History: Past Surgical History: Procedure Laterality Date  CARDIAC CATHETERIZATION  1993  CARDIAC CATHETERIZATION  April 2012  Mild reduction if EF at 50%. Patent LIMA to DX/LAD with collateralization to distal RCA, patent SVG to OM and occluded SVG to distal right and patent stent to LAD/Left main;  CORONARY ARTERY BYPASS GRAFT  05/1994  x5 LIMA to LAD & DX, SVG to LCX, SVG to RCA  CORONARY STENT PLACEMENT  2004  Stent to the L main  INTRAMEDULLARY (IM) NAIL INTERTROCHANTERIC Left 09/05/2017  Procedure:  INTRAMEDULLARY (IM) NAIL INTERTROCHANTRIC;  Surgeon: Beverley Evalene BIRCH, MD;  Location: [facility] OR;  Service: Orthopedics;  Laterality: Left; Dustin Olam Bull 07/27/2024, 3:17 PM  DG Abd Portable 1V Result Date: 07/24/2024 EXAM: 1 VIEW XRAY OF THE ABDOMEN 07/24/2024 09:54:00 AM COMPARISON: None available. CLINICAL HISTORY: 88 year old male. Abdominal distension. FINDINGS: LINES, TUBES AND DEVICES: Enteric feeding tube in place with tip in the right upper quadrant, could be in the gastric antrum or proximal duodenum. Advanced several centimeters to ensure postpyloric placement. BOWEL: Gas-filled dilation of loops of large bowel in the abdomen up to 7 cm. Combination of gas and barium-type oral contrast throughout the large bowel to the rectum. Severe sigmoid diverticulosis is evident. No transition point. Constellation favors ileus. SOFT TISSUES: Surgical clips in upper abdomen. Cholecystectomy clips. Vascular calcifications. BONES: Partially imaged left femoral intramedullary rod. Median sternotomy noted. No acute fracture. IMPRESSION: 1. Mildly distended large bowel containing combined gas and oral contrast to the rectum. Constellation favors ileus. 2. Enteric tube tip in RUQ; advance several cm if post-pyloric placement desired. Electronically signed by: Helayne Hurst MD 07/24/2024 11:16 AM EST RP Workstation: [id number]   DG CHEST PORT 1 VIEW Result Date: 07/21/2024 CLINICAL DATA:  Tachypnea. EXAM: PORTABLE CHEST 1 VIEW COMPARISON:  Chest radiograph dated 12/04/2022. FINDINGS: Feeding tube extends below diaphragm with tip beyond the  inferior margin of the image. Background of emphysema. No focal consolidation, pleural effusion pneumothorax. The cardiac silhouette is within limits. Median sternotomy wires. No acute osseous pathology. IMPRESSION: 1. No acute cardiopulmonary process. 2. Emphysema. Electronically Signed   By: Vanetta Chou M.D.   On: 07/21/2024 17:29   DG Swallowing Func-Speech  Pathology Result Date: 07/20/2024 Table formatting from the original result was not included. Modified Barium Swallow Study Patient Details Name: Larry Briggs MRN: [id number] Date of Birth: 12-Feb-1936 Today's Date: 07/20/2024 HPI/PMH: HPI: DASHAUN ONSTOTT is a 88 y.o. male from home with weakness on the right side with difficulty ambulating. MRI  Acute/subacute nonhemorrhagic 9 mm infarct in the posterior limb of the left internal capsule with associated T2 and FLAIR hyperintensities. PMH: CVA 2025 no dysphagia per wife, CAD, hypertension, hyperlipidemia Clinical Impression: Clinical Impression: Study was not recorded therefore cannot complete documentation for MBSImP. Pt  inconsistently able to follow commands with decreased cognition. He presents with a significant neurogenic oropharyngeal dysphagia  marked by slow and weak propulsion, disorganized/repetative  lingual movements with residue. Decreased safety and efficency with  laryngeal penetration and aspiration across multiple consistencies, mostly silent despite smaller volumes via teaspoon. Spontaneous swallows (3-4) reduced penetration briefly however he continued to penetrate and aspirate with subsequent consistencies. Mod-max residue remained throughout study. He is unsafe for po's at this time with hopeful improvement and ability to initiate po's with repeated study. MD has ordered Cortrak and pt may have ice chips after oral care with supervision. ST to follow. Factors that may increase risk of adverse event in presence of aspiration [name] & [name] 2021): No data recorded Recommendations/Plan: Swallowing Evaluation Recommendations Swallowing Evaluation Recommendations Recommendations: NPO Treatment Plan No data recorded Recommendations Recommendations for follow up therapy are one component of a multi-disciplinary discharge planning process, led by the attending physician.  Recommendations may be updated based on patient status, additional  functional criteria and insurance authorization. Assessment: Orofacial Exam: Orofacial Exam Oral Cavity: Oral Hygiene: WFL Oral Cavity - Dentition: Other (Comment) (not donned- not fitting using cream) Orofacial Anatomy: Other (comment) Oral Motor/Sensory Function: Other (comment) (appears to have slight left labial weakness although stroke affected right side, general weakness and low tone) Anatomy: Anatomy: Other (Comment) (one image recorded of anatomy, possible khyposis) Boluses Administered: Boluses Administered Boluses Administered: Thin liquids (Level 0); Mildly thick liquids (Level 2, nectar thick); Moderately thick liquids (Level 3, honey thick); Puree  Oral Impairment Domain: Oral Impairment Domain Lip Closure: -- (study did not record, cannot complete MBSImP documentation)  Pharyngeal Impairment Domain: No data recorded Esophageal Impairment Domain: No data recorded Pill: No data recorded Penetration/Aspiration Scale Score: No data recorded Compensatory Strategies: No data recorded  General Information: Caregiver present: No  Diet Prior to this Study: NPO   Temperature : Normal   Respiratory Status: WFL   Supplemental O2: None (Room air)   History of Recent Intubation: No  Behavior/Cognition: Other (Comment); Cooperative; Requires cueing; Pleasant mood (awake) Self-Feeding Abilities: Dependent for feeding Baseline vocal quality/speech: Hypophonia/low volume Volitional Cough: -- (more of a throat clear) No data recorded No data recorded Goal Planning: No data recorded No data recorded No data recorded No data recorded No data recorded Pain: Pain Assessment Pain Assessment: No/denies pain Faces Pain Scale: 0 Facial Expression: 0 Body Movements: 0 Muscle Tension: 0 Compliance with ventilator (intubated pts.): N/A Vocalization (extubated pts.): 0 CPOT Total: 0 Pain Location: back upon returning to supine Pain Descriptors / Indicators: Grimacing Pain Intervention(s): Monitored during session;  Repositioned End  of Session: Start Time:SLP Start Time (ACUTE ONLY): 1143 Stop Time: SLP Stop Time (ACUTE ONLY): 1200 Time Calculation:SLP Time Calculation (min) (ACUTE ONLY): 17 min Charges: SLP Evaluations $ SLP Speech Visit: 1 Visit SLP Evaluations $BSS Swallow: 1 Procedure $ SLP EVAL LANGUAGE/SOUND PRODUCTION: 1 Procedure SLP visit diagnosis: SLP Visit Diagnosis: Dysphagia, unspecified (R13.10) Past Medical History: Past Medical History: Diagnosis Date  CAD (coronary artery disease)   Remote MI in 53. CABG 1995. Stent to left main in 2004. Last cath in April of 2012. EF 50%; patent LIMA to DX/LAD with collateralization of the distal right, patent SVG to OM, occluded SVG to distal RCA, and patent stent to LAD  History of heart attack 1980  Hypercholesteremia   Hypertension   Increased glucose level   Obesity  Past Surgical History: Past Surgical History: Procedure Laterality Date  CARDIAC CATHETERIZATION  1993  CARDIAC CATHETERIZATION  April 2012  Mild reduction if EF at 50%. Patent LIMA to DX/LAD with collateralization to distal RCA, patent SVG to OM and occluded SVG to distal right and patent stent to LAD/Left main;  CORONARY ARTERY BYPASS GRAFT  05/1994  x5 LIMA to LAD & DX, SVG to LCX, SVG to RCA  CORONARY STENT PLACEMENT  2004  Stent to the L main  INTRAMEDULLARY (IM) NAIL INTERTROCHANTERIC Left 09/05/2017  Procedure: INTRAMEDULLARY (IM) NAIL INTERTROCHANTRIC;  Surgeon: Beverley Evalene BIRCH, MD;  Location: [facility] OR;  Service: Orthopedics;  Laterality: Left; Dustin Olam Bull 07/20/2024, 2:54 PM  VAS US  CAROTID Result Date: 07/20/2024 Carotid Arterial Duplex Study Patient Name:  Larry Briggs [facility]  Date of Exam:   07/19/2024 Medical Rec #: [id number]         Accession #:    [id number] Date of Birth: 1935/12/18        Patient Gender: M Patient Age:   74 years Exam Location:  [facility] Procedure:      VAS US  CAROTID Referring Phys: VERNAL POKHREL  --------------------------------------------------------------------------------  Indications:       CVA. Risk Factors:      Hyperlipidemia, past history of smoking, prior MI, coronary                    artery disease. Other Factors:     Afib, COPD,. Limitations        VERY limited exam due to patient with altered mental status                    (combative, vocalizing, hitting tech, poor ultrasound tissue                    interface.) Comparison Study:  No previous exams Performing Technologist: Jody Hill RVT, RDMS  Examination Guidelines: A complete evaluation includes B-mode imaging, spectral Doppler, color Doppler, and power Doppler as needed of all accessible portions of each vessel. Bilateral testing is considered an integral part of a complete examination. Limited examinations for reoccurring indications may be performed as noted.  Right Carotid Findings: +----------+--------+--------+--------+---------------------+------------------+           PSV cm/sEDV cm/sStenosisPlaque Description   Comments           +----------+--------+--------+--------+---------------------+------------------+ CCA Prox  79      10              heterogenous                            +----------+--------+--------+--------+---------------------+------------------+  CCA Distal86      10              heterogenous and                                                          calcific                                +----------+--------+--------+--------+---------------------+------------------+ ICA Prox  264     35      40-59%  calcific             not well                                                                  visualized         +----------+--------+--------+--------+---------------------+------------------+ ICA Mid                                                unable to                                                                 visualize           +----------+--------+--------+--------+---------------------+------------------+ ICA Distal                                             unable to                                                                 visualize          +----------+--------+--------+--------+---------------------+------------------+ ECA       213     17                                                      +----------+--------+--------+--------+---------------------+------------------+ +----------+--------+-------+----------------+-------------------+           PSV cm/sEDV cmsDescribe        Arm Pressure (mmHG) +----------+--------+-------+----------------+-------------------+ [id number]            Multiphasic, WNL                    +----------+--------+-------+----------------+-------------------+ +---------+--------+--+--------+--+---------+ VertebralPSV cm/s62EDV cm/s12Antegrade +---------+--------+--+--------+--+---------+  Left Carotid Findings: +----------+--------+--------+--------+---------------------+------------------+           PSV cm/sEDV cm/sStenosisPlaque Description   Comments           +----------+--------+--------+--------+---------------------+------------------+ CCA Prox  101     17              homogeneous and                                                           calcific                                +----------+--------+--------+--------+---------------------+------------------+ CCA Distal113     17                                                      +----------+--------+--------+--------+---------------------+------------------+ ICA Prox  216     42      40-59%  calcific             not well                                                                  visualized         +----------+--------+--------+--------+---------------------+------------------+ ICA Mid                                                unable to                                                                  visualize          +----------+--------+--------+--------+---------------------+------------------+ ICA Distal                                             unable to                                                                 visualize          +----------+--------+--------+--------+---------------------+------------------+ ECA       95      11                                                      +----------+--------+--------+--------+---------------------+------------------+ +----------+--------+--------+----------------+-------------------+  PSV cm/sEDV cm/sDescribe        Arm Pressure (mmHG) +----------+--------+--------+----------------+-------------------+ [id number]             Multiphasic, WNL                    +----------+--------+--------+----------------+-------------------+ +---------+--------+--+--------+--+---------+ VertebralPSV cm/s43EDV cm/s12Antegrade +---------+--------+--+--------+--+---------+   Summary: Right Carotid: Velocities in the right ICA are consistent with a 40-59%                stenosis. The ECA appears >50% stenosed. Left Carotid: Velocities in the left ICA are consistent with a 40-59% stenosis.  *See table(s) above for measurements and observations.  Electronically signed by Eather Popp MD on 07/20/2024 at 10:52:26 AM.    Final    MR ANGIO HEAD WO CONTRAST Result Date: 07/19/2024 EXAM: MR Angiography Head without intravenous Contrast. 07/19/2024 01:11:05 PM TECHNIQUE: Magnetic resonance angiography images of the head without intravenous contrast. Multiplanar 2D and 3D reformatted images are provided for review. COMPARISON: MRA head Dec 05, 2022 CLINICAL HISTORY: Stroke/TIA, determine embolic source FINDINGS: Moderately motion limited study.  Within this limitation: ANTERIOR CIRCULATION: No significant stenosis of the internal carotid arteries. No significant  stenosis of the anterior cerebral arteries. No significant stenosis of the middle cerebral arteries. No aneurysm. POSTERIOR CIRCULATION: No significant stenosis of the posterior cerebral arteries. No significant stenosis of the basilar artery. No significant stenosis of the vertebral arteries. No aneurysm. IMPRESSION: 1. Moderately motion limited study without large vessel occlusion or high grade stenosis. Electronically signed by: Gilmore Molt 07/19/2024 05:47 PM EST RP Workstation: [id number]   ECHOCARDIOGRAM COMPLETE Result Date: 07/19/2024    ECHOCARDIOGRAM REPORT   Patient Name:   Larry Briggs Date of Exam: 07/19/2024 Medical Rec #:  [id number]        Height:       70.0 in Accession #:    [id number]       Weight:       164.9 lb Date of Birth:  Sep 28, 1935       BSA:          1.923 m Patient Age:    88 years         BP:           206/183 mmHg Patient Gender: M                HR:           60 bpm. Exam Location:  Inpatient Procedure: 2D Echo, Cardiac Doppler and Color Doppler (Both Spectral and Color            Flow Doppler were utilized during procedure). Indications:    Stroke I63.9  History:        Patient has prior history of Echocardiogram examinations, most                 recent 09/07/2017. Stroke; Signs/Symptoms:Hypertensive Heart                 Disease.  Sonographer:    Sydnee Wilson Referring Phys: LAMAR DESS IMPRESSIONS  1. Left ventricular ejection fraction, by estimation, is 60 to 65%. The left ventricle has normal function. The left ventricle has no regional wall motion abnormalities. There is mild left ventricular hypertrophy. Left ventricular diastolic parameters are indeterminate.  2. Right ventricular systolic function is normal. The right ventricular size is normal.  3. A small pericardial effusion is present. There is no evidence of cardiac tamponade.  4. The mitral valve is  normal in structure. No evidence of mitral valve regurgitation. No evidence of mitral stenosis.  5. The  aortic valve is tricuspid. Aortic valve regurgitation is not visualized. No aortic stenosis is present. Aortic valve area, by VTI measures 2.61 cm. Aortic valve mean gradient measures 3.5 mmHg. Aortic valve Vmax measures 1.02 m/s.  6. The inferior vena cava is normal in size with greater than 50% respiratory variability, suggesting right atrial pressure of 3 mmHg. FINDINGS  Left Ventricle: Left ventricular ejection fraction, by estimation, is 60 to 65%. The left ventricle has normal function. The left ventricle has no regional wall motion abnormalities. The left ventricular internal cavity size was normal in size. There is  mild left ventricular hypertrophy. Left ventricular diastolic parameters are indeterminate. Normal left ventricular filling pressure. Right Ventricle: The right ventricular size is normal. No increase in right ventricular wall thickness. Right ventricular systolic function is normal. Left Atrium: Left atrial size was normal in size. Right Atrium: Right atrial size was normal in size. Pericardium: A small pericardial effusion is present. There is diastolic collapse of the right ventricular free wall. There is no evidence of cardiac tamponade. Mitral Valve: The mitral valve is normal in structure. No evidence of mitral valve regurgitation. No evidence of mitral valve stenosis. Tricuspid Valve: The tricuspid valve is normal in structure. Tricuspid valve regurgitation is not demonstrated. No evidence of tricuspid stenosis. Aortic Valve: The aortic valve is tricuspid. Aortic valve regurgitation is not visualized. No aortic stenosis is present. Aortic valve mean gradient measures 3.5 mmHg. Aortic valve peak gradient measures 4.2 mmHg. Aortic valve area, by VTI measures 2.61 cm. Pulmonic Valve: The pulmonic valve was normal in structure. Pulmonic valve regurgitation is not visualized. No evidence of pulmonic stenosis. Aorta: The aortic root is normal in size and structure. Venous: The inferior vena  cava is normal in size with greater than 50% respiratory variability, suggesting right atrial pressure of 3 mmHg. IAS/Shunts: No atrial level shunt detected by color flow Doppler.  LEFT VENTRICLE PLAX 2D LVIDd:         3.80 cm     Diastology LVIDs:         2.70 cm     LV e' medial:    10.40 cm/s LV PW:         1.30 cm     LV E/e' medial:  8.4 LV IVS:        1.00 cm     LV e' lateral:   12.90 cm/s LVOT diam:     2.00 cm     LV E/e' lateral: 6.8 LV SV:         56 LV SV Index:   29 LVOT Area:     3.14 cm  LV Volumes (MOD) LV vol d, MOD A2C: 72.8 ml LV vol d, MOD A4C: 71.7 ml LV vol s, MOD A2C: 25.2 ml LV vol s, MOD A4C: 28.9 ml LV SV MOD A2C:     47.6 ml LV SV MOD A4C:     71.7 ml LV SV MOD BP:      45.7 ml RIGHT VENTRICLE RV S prime:     11.70 cm/s TAPSE (M-mode): 1.6 cm LEFT ATRIUM             Index        RIGHT ATRIUM           Index LA diam:        3.40 cm 1.77 cm/m   RA Area:  13.90 cm LA Vol (A2C):   21.9 ml 11.39 ml/m  RA Volume:   33.10 ml  17.21 ml/m LA Vol (A4C):   20.3 ml 10.56 ml/m LA Biplane Vol: 21.6 ml 11.23 ml/m  AORTIC VALVE AV Area (Vmax):    3.55 cm AV Area (Vmean):   2.48 cm AV Area (VTI):     2.61 cm AV Vmax:           101.89 cm/s AV Vmean:          87.550 cm/s AV VTI:            0.216 m AV Peak Grad:      4.2 mmHg AV Mean Grad:      3.5 mmHg LVOT Vmax:         115.00 cm/s LVOT Vmean:        69.200 cm/s LVOT VTI:          0.179 m LVOT/AV VTI ratio: 0.83  AORTA Ao Root diam: 3.30 cm Ao Asc diam:  4.20 cm MITRAL VALVE MV Area (PHT): 3.95 cm    SHUNTS MV Decel Time: 192 msec    Systemic VTI:  0.18 m MV E velocity: 87.50 cm/s  Systemic Diam: 2.00 cm MV A velocity: 89.60 cm/s MV E/A ratio:  0.98 Annabella Scarce MD Electronically signed by Annabella Scarce MD Signature Date/Time: 07/19/2024/2:39:34 PM    Final    MR BRAIN WO CONTRAST Result Date: 07/18/2024 EXAM: MRI BRAIN WITHOUT CONTRAST 07/18/2024 07:35:00 PM TECHNIQUE: Multiplanar multisequence MRI of the head/brain was performed  without the administration of intravenous contrast. COMPARISON: MR head without contrast 07/17/2024. CLINICAL HISTORY: Stroke, follow up. FINDINGS: BRAIN AND VENTRICLES: An acute nonhemorrhagic 9 mm infarct is present within the posterior limb of the left internal capsule. T2 and FLAIR hyperintensities are associated with this infarct. Atrophy and white matter changes are otherwise stable. White matter changes extend into the brainstem. No intracranial hemorrhage. No mass. No midline shift. No hydrocephalus. The sella is unremarkable. Normal flow voids. ORBITS: Bilateral lens replacements are noted. The globes and orbits are otherwise within normal limits. SINUSES AND MASTOIDS: No acute abnormality. BONES AND SOFT TISSUES: Normal marrow signal. No acute soft tissue abnormality. IMPRESSION: 1. Acute/subacute nonhemorrhagic 9 mm infarct in the posterior limb of the left internal capsule with associated T2 and FLAIR hyperintensities. 2. Stable atrophy and white matter changes, including extension into the brainstem. Electronically signed by: Lonni Necessary MD 07/18/2024 08:00 PM EST RP Workstation: [id number]   MR THORACIC SPINE WO CONTRAST Result Date: 07/18/2024 EXAM: MR Thoracic Spine without 07/18/2024 04:25:32 PM TECHNIQUE: Multiplanar multisequence MRI of the thoracic spine was performed without the administration of intravenous contrast. COMPARISON: None available CLINICAL HISTORY: Back trauma, abnormal neuro exam, CT or xray positive (Age >= 16y). FINDINGS: BONES AND ALIGNMENT: Exaggerated kyphosis is present in the upper thoracic spine. Remote superior endplate compression fracture was present at T1 with less than 20% loss of height. A remote superior endplate fracture present at T5 with approximately 20% loss of height. A remote superior endplate fracture is present at L1 with 40% loss of height. No retropulsed bone is present. Marrow signal is unremarkable. SPINAL CORD: Normal spinal cord volume.  Normal spinal cord signal. SOFT TISSUES: Unremarkable. DEGENERATIVE CHANGES: Shallow central disc protrusions are present at T3-T4, T4-T5 and T6-T7 without significant stenosis. Facet hypertrophy and asymmetric right sided facet hypertrophy contributes to moderate right foraminal stenosis at T9-T10. Minimal disc bulging is present at T11-T12 without significant stenosis. SABRA  IMPRESSION: 1. Remote superior endplate compression fractures at T1 (<20% height loss), T5 (~20% height loss), and L1 (40% height loss), without retropulsed bone. 2. Moderate right foraminal stenosis at T9-T10 due to facet hypertrophy and asymmetric right-sided facet hypertrophy. 3. No other focal stenosis or cord changes to explain right lower extremity weakness. Electronically signed by: Lonni Necessary MD 07/18/2024 05:04 PM EST RP Workstation: [id number]   MR Cervical Spine Wo Contrast Result Date: 07/18/2024 EXAM: MRI CERVICAL SPINE WITHOUT CONTRAST 07/18/2024 04:25:11 PM TECHNIQUE: Multiplanar multisequence MRI of the cervical spine was performed. COMPARISON: None available. CLINICAL HISTORY: Right sided weakness. FINDINGS: BONES AND ALIGNMENT: Degenerative anterolisthesis is present at C5-C6. Remote superior endplate T1 fracture is present. Normal vertebral body heights. Bone marrow signal is unremarkable. SPINAL CORD: Normal spinal cord size. No abnormal spinal cord signal. SOFT TISSUES: No paraspinal mass. C2-C3: Moderate facet hypertrophy is present bilaterally without focal stenosis. No significant disc herniation. No spinal canal stenosis or neural foraminal narrowing. C3-C4: Mild disc bulging is present. Moderate facet hypertrophy is worse on the left. Moderate left foraminal stenosis is present. No spinal canal stenosis. C4-C5: A broad-based disc osteophyte complex partially effaces the ventral CSF. Moderate facet hypertrophy is present bilaterally. Mild bilateral foraminal narrowing is present. No spinal canal stenosis.  C5-C6: Degenerative anterolisthesis is present. Mild disc bulging is present without focal stenosis. No spinal canal stenosis or neural foraminal narrowing. C6-C7: A broad-based disc osteophyte complex is present. This effaces the ventral CSF. The foramina are patent bilaterally. No spinal canal stenosis. C7-T1: Remote superior endplate T1 fracture is present. No significant disc herniation. No spinal canal stenosis or neural foraminal narrowing. IMPRESSION: 1. Moderate left foraminal stenosis at C3-C4. 2. No significant central canal stenosis to explain lower extremity weakness. Electronically signed by: Lonni Necessary MD 07/18/2024 04:56 PM EST RP Workstation: [id number]   MR LUMBAR SPINE WO CONTRAST Result Date: 07/17/2024 EXAM: MRI LUMBAR SPINE 07/17/2024 08:14:00 PM TECHNIQUE: Multiplanar multisequence MRI of the lumbar spine was performed without the administration of intravenous contrast. COMPARISON: None available. CLINICAL HISTORY: leg weakness FINDINGS: BONES AND ALIGNMENT: Moderate dextroscoliosis. Trace degenerative stepwise retrolisthesis of L2 and L3 through L4 and L5. Chronic compression deformities involving the L1 and L5 vertebral bodies. Normal vertebral body heights. Bone marrow signal intensity within normal limits. No worrisome osseous lesions. No acute or recent fracture. Mild degenerative reactive endplate change with marrow edema present about the L3-L4 interspace. SPINAL CORD: The conus medullaris terminates at the level of L1. SOFT TISSUES: No paraspinal mass. T12-L1: Degenerative intervertebral disc space narrowing with disc desiccation. Reactive endplate spurring. No stenosis. L1-L2: Degenerative intervertebral disc space narrowing with diffuse disc bulge. Reactive endplate spurring. Mild facet hypertrophy. Borderline mild narrowing of the left lateral recess. Central canal remains patent. No foraminal stenosis. L2-L3: Degenerative intervertebral disc space narrowing with disc  desiccation and diffuse disc bulge, asymmetric to the left. Reactive endplate spurring, also worse on the left. Superimposed left foraminal to extraforaminal disc protrusion contacts the exiting left L2 nerve root (series 17, image 16). Mild to moderate bilateral facet hypertrophy. Result in mild to moderate narrowing of the left lateral recess. Mild to moderate left L2 foraminal stenosis. L3-L4: Degenerative intervertebral disc space narrowing with diffuse disc bulge and disc desiccation. Reactive endplate spurring. Changes asymmetric to the left. Mild to moderate bilateral facet hypertrophy. Result in mild canal with bilateral subarticular stenosis. Mild right with moderate left L3 foraminal narrowing. L4-L5: Degenerative intervertebral disc space narrowing with disc desiccation and diffuse disc  bulge. Reactive endplate spurring, worse on the right. Moderate right with mild left facet hypertrophy. Result in moderate right lateral recess stenosis. The central canal remains patent. Moderate right with mild left L4 foraminal narrowing. L5-S1: Disc bulge with disc desiccation. Reactive endplate spurring. Superimposed right subarticular to foraminal disc extrusion with superior migration. Disc material closely approximates both the right L5 and descending S1 nerve roots, either of which could be affected. Additional small left foraminal disc protrusion contacts the exiting left L5 nerve root (series 13, 15). Moderate right facet hypertrophy resulting in moderate narrowing of the right lateral recess. Central canal remains patent. Severe right with moderate left L5 hemorrhage. IMPRESSION: 1. No acute findings. 2. Right subarticular disc extrusion at L5-S1, potentially affecting the right L5 or descending S1 nerve roots. 3. Additional small left foraminal disc protrusion at L5-S1, potentially affecting the exiting left L5 nerve root. 4. Multifactorial degenerative changes at L4-L5 resulting in moderate right lateral  recess stenosis, with moderate right L4 foraminal narrowing. 5. Chronic compression deformities of L1 and L5. Electronically signed by: Morene Hoard MD 07/17/2024 09:15 PM EST RP Workstation: [id number]   MR BRAIN WO CONTRAST Result Date: 07/17/2024 EXAM: MRI BRAIN WITHOUT CONTRAST 07/17/2024 08:14:00 PM TECHNIQUE: Multiplanar multisequence MRI of the head/brain was performed without the administration of intravenous contrast. COMPARISON: CT from earlier the same day. CLINICAL HISTORY: leg weakness FINDINGS: BRAIN AND VENTRICLES: Generalized age-related atrophy. Patchy T2/FLAIR hyperintensity involving the periventricular and T2 white matter as well as the pons, consistent with chronic small vessel ischemic disease, moderate in nature. Few superimposed remote lacunar infarcts present by the hemispheric cerebral white matter. Few punctate chronic microhemorrhages noted, likely small vessels hyperintense in nature. No acute infarct. No mass. No midline shift. No hydrocephalus. The sella is unremarkable. Normal flow voids. ORBITS: Prior bilateral lens replacement. No acute abnormality. SINUSES AND MASTOIDS: Mild scattered mucosal thickening about the right greater than left ethmoid air cells. BONES AND SOFT TISSUES: Normal marrow signal. No acute soft tissue abnormality. IMPRESSION: 1. No acute intracranial abnormality. 2. Generalized age-related atrophy with moderate chronic microvascular ischemic disease. Electronically signed by: Morene Hoard MD 07/17/2024 08:46 PM EST RP Workstation: [id number]   CT Head Wo Contrast Result Date: 07/17/2024 EXAM: CT HEAD WITHOUT CONTRAST 07/17/2024 05:00:00 PM TECHNIQUE: CT of the head was performed without the administration of intravenous contrast. Automated exposure control, iterative reconstruction, and/or weight based adjustment of the mA/kV was utilized to reduce the radiation dose to as low as reasonably achievable. COMPARISON: 12/04/2022. CLINICAL  HISTORY: weakness FINDINGS: BRAIN AND VENTRICLES: No acute hemorrhage. No evidence of acute infarct. No hydrocephalus. No extra-axial collection. No mass effect or midline shift. Moderate global atrophy and white matter hypoattenuation, status post prior examination, likely referral of small vessel ischemia. Intracranial vascular calcifications. ORBITS: No acute abnormality. SINUSES: No acute abnormality. Mastoid air cells and middle ear cavities are clear. SOFT TISSUES AND SKULL: No acute soft tissue abnormality. No skull fracture. IMPRESSION: 1. No acute intracranial abnormality. 2. Moderate global cerebral atrophy and chronic small vessel ischemic changes, with intracranial vascular calcifications. Electronically signed by: Dorethia Molt MD 07/17/2024 05:28 PM EST RP Workstation: [id number]    Microbiology: Results for orders placed or performed in visit on 07/05/19  Fungus Culture & Smear     Status: None   Collection Time: 07/05/19 10:04 AM   Specimen: Sputum  Result Value Ref Range Status   MICRO NUMBER: [id number]  Final   SPECIMEN QUALITY: Adequate  Final   Source: SPUTUM  Final   STATUS: FINAL  Final   SMEAR: No fungal elements seen.  Final   CULTURE:   Final    No fungus isolated Growth of bacteria present which may inhibit the isolation of certain fungi  Respiratory or Resp and Sputum Culture     Status: Abnormal   Collection Time: 07/05/19 10:04 AM   Specimen: Sputum  Result Value Ref Range Status   MICRO NUMBER: [id number]  Final   SPECIMEN QUALITY: Adequate  Final   Source SPUTUM  Final   STATUS: FINAL  Final   GRAM STAIN: (A)  Final    Gram stain indicates that the specimen is representative of the lower respiratory tract. No white blood cells seen Many Gram negative bacilli Moderate Gram positive bacilli   RESULT: Normal oropharyngeal flora also present.  Final   ISOLATE 1: Pseudomonas aeruginosa (A)  Final    Comment: Heavy growth of Pseudomonas aeruginosa       Susceptibility   Pseudomonas aeruginosa - RESPIRATORY/SPUTUM CULT NEGATIVE 1    CEFTAZIDIME 2 Sensitive     CEFEPIME <=1 Sensitive     CIPROFLOXACIN <=0.25 Sensitive     LEVOFLOXACIN  0.5 Sensitive     GENTAMICIN <=1 Sensitive     IMIPENEM 1 Sensitive     PIP/TAZO <=4 Sensitive     TOBRAMYCIN* <=1 Sensitive      * Legend:S = Susceptible  I = IntermediateR = Resistant  NS = Not susceptible* = Not tested  NR = Not reported**NN = See antimicrobic comments    Labs: CBC: Recent Labs  Lab 07/27/24 1038 07/30/24 0236  WBC 10.9* 10.7*  NEUTROABS 8.7* 8.8*  HGB 9.6* 9.3*  HCT 30.6* 29.7*  MCV 92.7 91.7  PLT 378 430*   Basic Metabolic Panel: Recent Labs  Lab 07/27/24 1038 07/30/24 0236  NA 141 142  K 4.3 3.7  CL 106 106  CO2 25 27  GLUCOSE 149* 104*  BUN 45* 44*  CREATININE 0.93 1.01  CALCIUM  8.5* 8.3*  MG  --  2.5*  PHOS  --  3.2   Liver Function Tests: No results for input(s): AST, ALT, ALKPHOS, BILITOT, PROT, ALBUMIN  in the last 168 hours. CBG: Recent Labs  Lab 07/31/24 1612 07/31/24 2029 07/31/24 2359 08/01/24 0349 08/01/24 0810  GLUCAP 157* 192* 120* 100* 116*    Discharge time spent: 40 minutes  Signed: Casimer Dare, MD [facility] 08/01/2024 "

## 2024-08-14 ENCOUNTER — Telehealth: Payer: Self-pay | Admitting: *Deleted

## 2024-08-14 NOTE — Telephone Encounter (Signed)
 Copied from CRM #[id number]. Topic: Clinical - Medication Question >> Aug 13, 2024  4:12 PM Dedra B wrote: Reason for CRM: Patient's wife, Devere, said patient was recently hospitalized for a stroke., He is currently in rehab at [facility]. Patient is struggling with his breathing and unable to do OT and PT without taking 2 puffs of his inhaler. She wants to know if there is stronger emergency inhaler than the Ventolin . She would like a call from Dr. Annella or his nurse.  ------------------------------------------------------------------------------------------------------------------------------------------------  ATC patient's wife, Devere at [phone], however there was no answer and no VM set up.  Will attempt another time.  ATC mobile number listed (patient's number), [phone]), LVM to return call.

## 2024-08-20 NOTE — Telephone Encounter (Signed)
 Copied from CRM #[id number]. Topic: Clinical - Medication Question >> Aug 13, 2024  4:12 PM Dedra B wrote: Reason for CRM: Patient's wife, Devere, said patient was recently hospitalized for a stroke., He is currently in rehab at [facility]. Patient is struggling with his breathing and unable to do OT and PT without taking 2 puffs of his inhaler. She wants to know if there is stronger emergency inhaler than the Ventolin . She would like a call from Dr. Annella or his nurse. >> Aug 20, 2024  1:10 PM Leila C wrote: Patient's wife Devere [phone] is returning the office/Zayed Griffie's call, unsure what the call is for. Devere states patient has in [facility] rehab and is not using his phone, to please call Devere [phone]. Per CAL, unable to reach CAL, please call back.   --------------------------------------------------------------------------------------------------------------------------------------------- Patient's wife states he gets very out of breath doing his OT and PT at [facility] rehab.  He is using his Trelegy inhaler and albuterol , however states that the albuterol  really does not do anything for his sob.  She wants to know if there is something stronger that he could use as a rescue inhaler.  I advised her that Dr. Annella would not be back in the office until Monday, however, I would send a message and see what he recommends.   Dr. Annella, Please advise on stronger rescue inhaler.  He is currently using albuterol  and it does not help with sob.  Thank you,.

## 2024-08-22 NOTE — Telephone Encounter (Signed)
 Returned call to the pt's spouse, Devere- there was no answer and her VM has not been set up yet.

## 2024-08-29 ENCOUNTER — Telehealth: Payer: Self-pay | Admitting: Cardiology

## 2024-08-29 NOTE — Telephone Encounter (Signed)
 Patient wife called to notify Dr Jordan that patient had a stroke 07/17/24 and is currently located at [facility]. Stated patient will not need medication refills or be able to schedule any appointments until he is doing better.

## 2024-09-04 ENCOUNTER — Inpatient Hospital Stay (HOSPITAL_COMMUNITY)
Admission: EM | Admit: 2024-09-04 | Source: Home / Self Care | Attending: Internal Medicine | Admitting: Internal Medicine

## 2024-09-04 ENCOUNTER — Other Ambulatory Visit: Payer: Self-pay

## 2024-09-04 ENCOUNTER — Emergency Department (HOSPITAL_COMMUNITY)

## 2024-09-04 DIAGNOSIS — S82209A Unspecified fracture of shaft of unspecified tibia, initial encounter for closed fracture: Secondary | ICD-10-CM | POA: Diagnosis present

## 2024-09-04 DIAGNOSIS — W19XXXA Unspecified fall, initial encounter: Secondary | ICD-10-CM

## 2024-09-04 DIAGNOSIS — I10 Essential (primary) hypertension: Secondary | ICD-10-CM | POA: Diagnosis present

## 2024-09-04 DIAGNOSIS — E7800 Pure hypercholesterolemia, unspecified: Secondary | ICD-10-CM | POA: Diagnosis present

## 2024-09-04 DIAGNOSIS — D649 Anemia, unspecified: Secondary | ICD-10-CM | POA: Insufficient documentation

## 2024-09-04 DIAGNOSIS — R7989 Other specified abnormal findings of blood chemistry: Secondary | ICD-10-CM | POA: Insufficient documentation

## 2024-09-04 DIAGNOSIS — S82302A Unspecified fracture of lower end of left tibia, initial encounter for closed fracture: Principal | ICD-10-CM

## 2024-09-04 DIAGNOSIS — S82832A Other fracture of upper and lower end of left fibula, initial encounter for closed fracture: Secondary | ICD-10-CM

## 2024-09-04 DIAGNOSIS — I480 Paroxysmal atrial fibrillation: Secondary | ICD-10-CM | POA: Insufficient documentation

## 2024-09-04 DIAGNOSIS — J449 Chronic obstructive pulmonary disease, unspecified: Secondary | ICD-10-CM | POA: Diagnosis present

## 2024-09-04 DIAGNOSIS — I639 Cerebral infarction, unspecified: Secondary | ICD-10-CM | POA: Diagnosis present

## 2024-09-04 HISTORY — DX: Dyspnea, unspecified: R06.00

## 2024-09-04 HISTORY — DX: Cerebral infarction, unspecified: I63.9

## 2024-09-04 HISTORY — DX: Pneumonia, unspecified organism: J18.9

## 2024-09-04 HISTORY — DX: Cardiac arrhythmia, unspecified: I49.9

## 2024-09-04 HISTORY — DX: Chronic obstructive pulmonary disease, unspecified: J44.9

## 2024-09-04 HISTORY — DX: Acute myocardial infarction, unspecified: I21.9

## 2024-09-04 LAB — CBC WITH DIFFERENTIAL/PLATELET
Abs Immature Granulocytes: 0.03 10*3/uL (ref 0.00–0.07)
Basophils Absolute: 0 10*3/uL (ref 0.0–0.1)
Basophils Relative: 1 %
Eosinophils Absolute: 0 10*3/uL (ref 0.0–0.5)
Eosinophils Relative: 0 %
HCT: 29.3 % — ABNORMAL LOW (ref 39.0–52.0)
Hemoglobin: 8.8 g/dL — ABNORMAL LOW (ref 13.0–17.0)
Immature Granulocytes: 0 %
Lymphocytes Relative: 6 %
Lymphs Abs: 0.5 10*3/uL — ABNORMAL LOW (ref 0.7–4.0)
MCH: 25.6 pg — ABNORMAL LOW (ref 26.0–34.0)
MCHC: 30 g/dL (ref 30.0–36.0)
MCV: 85.2 fL (ref 80.0–100.0)
Monocytes Absolute: 0.7 10*3/uL (ref 0.1–1.0)
Monocytes Relative: 8 %
Neutro Abs: 7.4 10*3/uL (ref 1.7–7.7)
Neutrophils Relative %: 85 %
Platelets: 361 10*3/uL (ref 150–400)
RBC: 3.44 MIL/uL — ABNORMAL LOW (ref 4.22–5.81)
RDW: 14.9 % (ref 11.5–15.5)
WBC: 8.7 10*3/uL (ref 4.0–10.5)
nRBC: 0 % (ref 0.0–0.2)

## 2024-09-04 LAB — COMPREHENSIVE METABOLIC PANEL WITH GFR
ALT: 11 U/L (ref 0–44)
AST: 22 U/L (ref 15–41)
Albumin: 3.2 g/dL — ABNORMAL LOW (ref 3.5–5.0)
Alkaline Phosphatase: 106 U/L (ref 38–126)
Anion gap: 12 (ref 5–15)
BUN: 23 mg/dL (ref 8–23)
CO2: 23 mmol/L (ref 22–32)
Calcium: 8.4 mg/dL — ABNORMAL LOW (ref 8.9–10.3)
Chloride: 105 mmol/L (ref 98–111)
Creatinine, Ser: 1.16 mg/dL (ref 0.61–1.24)
GFR, Estimated: >60 mL/min
Glucose, Bld: 133 mg/dL — ABNORMAL HIGH (ref 70–99)
Potassium: 3.4 mmol/L — ABNORMAL LOW (ref 3.5–5.1)
Sodium: 140 mmol/L (ref 135–145)
Total Bilirubin: 0.8 mg/dL (ref 0.0–1.2)
Total Protein: 6.4 g/dL — ABNORMAL LOW (ref 6.5–8.1)

## 2024-09-04 LAB — I-STAT VENOUS BLOOD GAS, ED
Acid-base deficit: 1 mmol/L (ref 0.0–2.0)
Bicarbonate: 23.8 mmol/L (ref 20.0–28.0)
Calcium, Ion: 1.1 mmol/L — ABNORMAL LOW (ref 1.15–1.40)
HCT: 28 % — ABNORMAL LOW (ref 39.0–52.0)
Hemoglobin: 9.5 g/dL — ABNORMAL LOW (ref 13.0–17.0)
O2 Saturation: 73 %
Potassium: 3.4 mmol/L — ABNORMAL LOW (ref 3.5–5.1)
Sodium: 141 mmol/L (ref 135–145)
TCO2: 25 mmol/L (ref 22–32)
pCO2, Ven: 37.8 mmHg — ABNORMAL LOW (ref 44–60)
pH, Ven: 7.408 (ref 7.25–7.43)
pO2, Ven: 38 mmHg (ref 32–45)

## 2024-09-04 LAB — TROPONIN T, HIGH SENSITIVITY: Troponin T High Sensitivity: 99 ng/L — ABNORMAL HIGH (ref 0–19)

## 2024-09-04 LAB — CBG MONITORING, ED: Glucose-Capillary: 114 mg/dL — ABNORMAL HIGH (ref 70–99)

## 2024-09-04 MED ORDER — OXYCODONE-ACETAMINOPHEN 5-325 MG PO TABS
1.0000 | ORAL_TABLET | Freq: Once | ORAL | Status: AC
  Administered 2024-09-04: 1 via ORAL
  Filled 2024-09-04: qty 1

## 2024-09-04 NOTE — Telephone Encounter (Signed)
 Patient's wife, Devere, called again today regarding the inhaler. Please call her back on this phone number [phone].  Thanks.

## 2024-09-04 NOTE — Telephone Encounter (Signed)
 I recommend duoneb via nebulizer every 4 hours as needed - is he at [facility] still? If so I recommend they administer this medication. If not we can order machine and neb medicine.

## 2024-09-04 NOTE — Progress Notes (Signed)
 Orthopedic Tech Progress Note Patient Details:  Larry Briggs 1936-06-15 [id number] Applied posterior short leg and stirrup splint with assistance from NT per order.  Ortho Devices Type of Ortho Device: Ace wrap, Cotton web roll, Post (short leg) splint, Stirrup splint Ortho Device/Splint Location: LLE Ortho Device/Splint Interventions: Ordered, Application, Adjustment   Post Interventions Patient Tolerated: Well Instructions Provided: Adjustment of device, Care of device, Poper ambulation with device  [name] 09/04/2024, 11:50 PM

## 2024-09-04 NOTE — ED Triage Notes (Signed)
 Pt. Was BIB [facility] from [facility] and [facility] d/t a fall that happened earlier today; Per [facility] the pt. Was in bed and his remote fell, the pt. Tried to reach for it but fell out of bed; Pt. Has a lac on R FA and noticeable bruising to L ankle; [facility] stabilized the ankle. Pt. Is bedbound at baseline and has a Hx of COPD.   VS per [facility]:  BP: 158/88 HR: 72 RR: 20 CBG: 167

## 2024-09-04 NOTE — Telephone Encounter (Signed)
 Awaiting Dr. Annella response.

## 2024-09-05 ENCOUNTER — Encounter (HOSPITAL_COMMUNITY): Admission: EM | Payer: Self-pay | Source: Home / Self Care | Attending: Internal Medicine

## 2024-09-05 ENCOUNTER — Ambulatory Visit: Admitting: Cardiology

## 2024-09-05 ENCOUNTER — Inpatient Hospital Stay (HOSPITAL_COMMUNITY)

## 2024-09-05 ENCOUNTER — Inpatient Hospital Stay (HOSPITAL_COMMUNITY): Admitting: Anesthesiology

## 2024-09-05 ENCOUNTER — Other Ambulatory Visit: Payer: Self-pay

## 2024-09-05 ENCOUNTER — Encounter (HOSPITAL_COMMUNITY): Payer: Self-pay | Admitting: Internal Medicine

## 2024-09-05 DIAGNOSIS — I480 Paroxysmal atrial fibrillation: Secondary | ICD-10-CM | POA: Diagnosis not present

## 2024-09-05 DIAGNOSIS — D649 Anemia, unspecified: Secondary | ICD-10-CM | POA: Insufficient documentation

## 2024-09-05 DIAGNOSIS — I10 Essential (primary) hypertension: Secondary | ICD-10-CM

## 2024-09-05 DIAGNOSIS — R7989 Other specified abnormal findings of blood chemistry: Secondary | ICD-10-CM | POA: Diagnosis not present

## 2024-09-05 DIAGNOSIS — S82302A Unspecified fracture of lower end of left tibia, initial encounter for closed fracture: Secondary | ICD-10-CM | POA: Diagnosis not present

## 2024-09-05 LAB — CBC
HCT: 27 % — ABNORMAL LOW (ref 39.0–52.0)
Hemoglobin: 7.9 g/dL — ABNORMAL LOW (ref 13.0–17.0)
MCH: 25.6 pg — ABNORMAL LOW (ref 26.0–34.0)
MCHC: 29.3 g/dL — ABNORMAL LOW (ref 30.0–36.0)
MCV: 87.7 fL (ref 80.0–100.0)
Platelets: 349 10*3/uL (ref 150–400)
RBC: 3.08 MIL/uL — ABNORMAL LOW (ref 4.22–5.81)
RDW: 14.9 % (ref 11.5–15.5)
WBC: 7.9 10*3/uL (ref 4.0–10.5)
nRBC: 0 % (ref 0.0–0.2)

## 2024-09-05 LAB — TROPONIN T, HIGH SENSITIVITY: Troponin T High Sensitivity: 92 ng/L — ABNORMAL HIGH (ref 0–19)

## 2024-09-05 LAB — BASIC METABOLIC PANEL WITH GFR
Anion gap: 17 — ABNORMAL HIGH (ref 5–15)
BUN: 8 mg/dL (ref 8–23)
CO2: 16 mmol/L — ABNORMAL LOW (ref 22–32)
Calcium: 8.6 mg/dL — ABNORMAL LOW (ref 8.9–10.3)
Chloride: 102 mmol/L (ref 98–111)
Creatinine, Ser: 0.75 mg/dL (ref 0.61–1.24)
GFR, Estimated: >60 mL/min
Glucose, Bld: 129 mg/dL — ABNORMAL HIGH (ref 70–99)
Potassium: 4.2 mmol/L (ref 3.5–5.1)
Sodium: 135 mmol/L (ref 135–145)

## 2024-09-05 LAB — PREPARE RBC (CROSSMATCH)

## 2024-09-05 LAB — VITAMIN D 25 HYDROXY (VIT D DEFICIENCY, FRACTURES): Vit D, 25-Hydroxy: 14.8 ng/mL — ABNORMAL LOW (ref 30–100)

## 2024-09-05 LAB — HEMOGLOBIN AND HEMATOCRIT, BLOOD
HCT: 27.2 % — ABNORMAL LOW (ref 39.0–52.0)
Hemoglobin: 8.3 g/dL — ABNORMAL LOW (ref 13.0–17.0)

## 2024-09-05 MED ORDER — DROPERIDOL 2.5 MG/ML IJ SOLN: 0.6250 mg | Freq: Once | INTRAMUSCULAR | Status: DC | PRN

## 2024-09-05 MED ORDER — ACETAMINOPHEN 325 MG PO TABS
650.0000 mg | ORAL_TABLET | Freq: Four times a day (QID) | ORAL | Status: AC
  Administered 2024-09-05 – 2024-09-07 (×6): 650 mg via ORAL
  Filled 2024-09-05 (×8): qty 2

## 2024-09-05 MED ORDER — METOCLOPRAMIDE HCL 5 MG PO TABS: 5.0000 mg | ORAL_TABLET | Freq: Three times a day (TID) | ORAL | Status: AC | PRN

## 2024-09-05 MED ORDER — ONDANSETRON HCL 4 MG PO TABS: 4.0000 mg | ORAL_TABLET | Freq: Four times a day (QID) | ORAL | Status: AC | PRN

## 2024-09-05 MED ORDER — ROCURONIUM BROMIDE 10 MG/ML (PF) SYRINGE
PREFILLED_SYRINGE | INTRAVENOUS | Status: DC | PRN
  Administered 2024-09-05: 40 mg via INTRAVENOUS

## 2024-09-05 MED ORDER — PROPOFOL 10 MG/ML IV BOLUS
INTRAVENOUS | Status: DC | PRN
  Administered 2024-09-05: 100 mg via INTRAVENOUS

## 2024-09-05 MED ORDER — QUETIAPINE FUMARATE 25 MG PO TABS: 12.5000 mg | ORAL_TABLET | Freq: Every day | ORAL | Status: DC

## 2024-09-05 MED ORDER — FENTANYL CITRATE (PF) 100 MCG/2ML IJ SOLN: 25.0000 ug | INTRAMUSCULAR | Status: DC | PRN

## 2024-09-05 MED ORDER — ONDANSETRON HCL 4 MG/2ML IJ SOLN: 4.0000 mg | Freq: Four times a day (QID) | INTRAMUSCULAR | Status: AC | PRN

## 2024-09-05 MED ORDER — DEXAMETHASONE SOD PHOSPHATE PF 10 MG/ML IJ SOLN
INTRAMUSCULAR | Status: DC | PRN
  Administered 2024-09-05: 10 mg via INTRAVENOUS

## 2024-09-05 MED ORDER — ONDANSETRON HCL 4 MG/2ML IJ SOLN
INTRAMUSCULAR | Status: DC | PRN
  Administered 2024-09-05: 4 mg via INTRAVENOUS

## 2024-09-05 MED ORDER — AZITHROMYCIN 250 MG PO TABS
250.0000 mg | ORAL_TABLET | ORAL | Status: DC
  Administered 2024-09-05: 250 mg via ORAL
  Filled 2024-09-05: qty 1

## 2024-09-05 MED ORDER — METOPROLOL SUCCINATE ER 25 MG PO TB24
25.0000 mg | ORAL_TABLET | Freq: Every day | ORAL | Status: AC
  Administered 2024-09-05 – 2024-09-06 (×2): 25 mg via ORAL
  Filled 2024-09-05 (×3): qty 1

## 2024-09-05 MED ORDER — OXYCODONE HCL 5 MG PO TABS: 5.0000 mg | ORAL_TABLET | Freq: Once | ORAL | Status: DC | PRN

## 2024-09-05 MED ORDER — FENTANYL CITRATE (PF) 250 MCG/5ML IJ SOLN
INTRAMUSCULAR | Status: AC
  Filled 2024-09-05: qty 5

## 2024-09-05 MED ORDER — METOCLOPRAMIDE HCL 5 MG/ML IJ SOLN: 5.0000 mg | Freq: Three times a day (TID) | INTRAMUSCULAR | Status: AC | PRN

## 2024-09-05 MED ORDER — SUCCINYLCHOLINE CHLORIDE 200 MG/10ML IV SOSY
PREFILLED_SYRINGE | INTRAVENOUS | Status: AC
  Filled 2024-09-05: qty 10

## 2024-09-05 MED ORDER — ONDANSETRON HCL 4 MG/2ML IJ SOLN
INTRAMUSCULAR | Status: AC
  Filled 2024-09-05: qty 2

## 2024-09-05 MED ORDER — LIDOCAINE 2% (20 MG/ML) 5 ML SYRINGE
INTRAMUSCULAR | Status: DC | PRN
  Administered 2024-09-05: 80 mg via INTRAVENOUS

## 2024-09-05 MED ORDER — CHLORHEXIDINE GLUCONATE 4 % EX SOLN: 60.0000 mL | Freq: Once | CUTANEOUS | Status: DC

## 2024-09-05 MED ORDER — MORPHINE SULFATE (PF) 2 MG/ML IV SOLN
0.5000 mg | INTRAVENOUS | Status: AC | PRN
  Administered 2024-09-05: 0.5 mg via INTRAVENOUS
  Filled 2024-09-05: qty 1

## 2024-09-05 MED ORDER — AMLODIPINE BESYLATE 5 MG PO TABS
5.0000 mg | ORAL_TABLET | Freq: Every day | ORAL | Status: AC
  Administered 2024-09-05 – 2024-09-06 (×2): 5 mg via ORAL
  Filled 2024-09-05 (×3): qty 1

## 2024-09-05 MED ORDER — METHOCARBAMOL 500 MG PO TABS
500.0000 mg | ORAL_TABLET | Freq: Four times a day (QID) | ORAL | Status: AC | PRN
  Administered 2024-09-06: 500 mg via ORAL
  Filled 2024-09-05 (×2): qty 1

## 2024-09-05 MED ORDER — VANCOMYCIN HCL 1000 MG IV SOLR
INTRAVENOUS | Status: AC
  Filled 2024-09-05: qty 20

## 2024-09-05 MED ORDER — POVIDONE-IODINE 10 % EX SWAB
2.0000 | Freq: Once | CUTANEOUS | Status: AC
  Administered 2024-09-05: 2 via TOPICAL

## 2024-09-05 MED ORDER — METHOCARBAMOL 1000 MG/10ML IJ SOLN: 500.0000 mg | Freq: Four times a day (QID) | INTRAMUSCULAR | Status: AC | PRN

## 2024-09-05 MED ORDER — TAMSULOSIN HCL 0.4 MG PO CAPS
0.4000 mg | ORAL_CAPSULE | Freq: Every day | ORAL | Status: AC
  Administered 2024-09-05 – 2024-09-07 (×3): 0.4 mg via ORAL
  Filled 2024-09-05 (×3): qty 1

## 2024-09-05 MED ORDER — SODIUM CHLORIDE 0.9 % IV SOLN: 10.0000 mL/h | Freq: Once | INTRAVENOUS | Status: AC

## 2024-09-05 MED ORDER — CEFAZOLIN SODIUM-DEXTROSE 2-4 GM/100ML-% IV SOLN
2.0000 g | Freq: Three times a day (TID) | INTRAVENOUS | Status: AC
  Administered 2024-09-05 – 2024-09-06 (×3): 2 g via INTRAVENOUS
  Filled 2024-09-05 (×3): qty 100

## 2024-09-05 MED ORDER — OXYCODONE HCL 5 MG/5ML PO SOLN: 5.0000 mg | Freq: Once | ORAL | Status: DC | PRN

## 2024-09-05 MED ORDER — TAMSULOSIN HCL 0.4 MG PO CAPS: 0.4000 mg | ORAL_CAPSULE | Freq: Every day | ORAL | Status: DC

## 2024-09-05 MED ORDER — PHENYLEPHRINE 80 MCG/ML (10ML) SYRINGE FOR IV PUSH (FOR BLOOD PRESSURE SUPPORT)
PREFILLED_SYRINGE | INTRAVENOUS | Status: DC | PRN
  Administered 2024-09-05: 160 ug via INTRAVENOUS
  Administered 2024-09-05: 240 ug via INTRAVENOUS

## 2024-09-05 MED ORDER — DOCUSATE SODIUM 100 MG PO CAPS: 100.0000 mg | ORAL_CAPSULE | Freq: Every day | ORAL | Status: DC | PRN

## 2024-09-05 MED ORDER — LIDOCAINE 2% (20 MG/ML) 5 ML SYRINGE
INTRAMUSCULAR | Status: AC
  Filled 2024-09-05: qty 5

## 2024-09-05 MED ORDER — VITAMIN B-12 1000 MCG PO TABS
1000.0000 ug | ORAL_TABLET | Freq: Every day | ORAL | Status: AC
  Administered 2024-09-05 – 2024-09-07 (×3): 1000 ug via ORAL
  Filled 2024-09-05 (×3): qty 1

## 2024-09-05 MED ORDER — PHENYLEPHRINE HCL-NACL 20-0.9 MG/250ML-% IV SOLN
INTRAVENOUS | Status: DC | PRN
  Administered 2024-09-05: 30 ug/min via INTRAVENOUS

## 2024-09-05 MED ORDER — ORAL CARE MOUTH RINSE: 15.0000 mL | Freq: Once | OROMUCOSAL | Status: AC

## 2024-09-05 MED ORDER — MORPHINE SULFATE (PF) 2 MG/ML IV SOLN: 0.5000 mg | INTRAVENOUS | Status: DC | PRN

## 2024-09-05 MED ORDER — LACTATED RINGERS IV SOLN: INTRAVENOUS | Status: DC

## 2024-09-05 MED ORDER — VANCOMYCIN HCL 1000 MG IV SOLR
INTRAVENOUS | Status: DC | PRN
  Administered 2024-09-05: 1000 mg

## 2024-09-05 MED ORDER — SUGAMMADEX SODIUM 200 MG/2ML IV SOLN
INTRAVENOUS | Status: DC | PRN
  Administered 2024-09-05: 200 mg via INTRAVENOUS

## 2024-09-05 MED ORDER — ROCURONIUM BROMIDE 10 MG/ML (PF) SYRINGE
PREFILLED_SYRINGE | INTRAVENOUS | Status: AC
  Filled 2024-09-05: qty 10

## 2024-09-05 MED ORDER — FENTANYL CITRATE (PF) 250 MCG/5ML IJ SOLN
INTRAMUSCULAR | Status: DC | PRN
  Administered 2024-09-05 (×3): 50 ug via INTRAVENOUS

## 2024-09-05 MED ORDER — CHLORHEXIDINE GLUCONATE 0.12 % MT SOLN
15.0000 mL | Freq: Once | OROMUCOSAL | Status: AC
  Administered 2024-09-05: 15 mL via OROMUCOSAL

## 2024-09-05 MED ORDER — DIPHENHYDRAMINE HCL 12.5 MG/5ML PO ELIX: 12.5000 mg | ORAL_SOLUTION | ORAL | Status: AC | PRN

## 2024-09-05 MED ORDER — ALBUMIN HUMAN 5 % IV SOLN: INTRAVENOUS | Status: DC | PRN

## 2024-09-05 MED ORDER — SUCCINYLCHOLINE CHLORIDE 200 MG/10ML IV SOSY
PREFILLED_SYRINGE | INTRAVENOUS | Status: DC | PRN
  Administered 2024-09-05: 100 mg via INTRAVENOUS

## 2024-09-05 MED ORDER — ACETAMINOPHEN 10 MG/ML IV SOLN: 1000.0000 mg | Freq: Once | INTRAVENOUS | Status: DC | PRN

## 2024-09-05 MED ORDER — ALBUTEROL SULFATE (2.5 MG/3ML) 0.083% IN NEBU
2.5000 mg | INHALATION_SOLUTION | RESPIRATORY_TRACT | Status: AC | PRN
  Administered 2024-09-06: 2.5 mg via RESPIRATORY_TRACT
  Filled 2024-09-05: qty 3

## 2024-09-05 MED ORDER — SIMVASTATIN 20 MG PO TABS
40.0000 mg | ORAL_TABLET | Freq: Every day | ORAL | Status: AC
  Administered 2024-09-05 – 2024-09-07 (×3): 40 mg via ORAL
  Filled 2024-09-05 (×3): qty 2

## 2024-09-05 MED ORDER — OMEGA-3-ACID ETHYL ESTERS 1 G PO CAPS
1.0000 g | ORAL_CAPSULE | Freq: Every day | ORAL | Status: AC
  Administered 2024-09-05 – 2024-09-07 (×3): 1 g via ORAL
  Filled 2024-09-05 (×3): qty 1

## 2024-09-05 MED ORDER — BUDESON-GLYCOPYRROL-FORMOTEROL 160-9-4.8 MCG/ACT IN AERO
2.0000 | INHALATION_SPRAY | Freq: Two times a day (BID) | RESPIRATORY_TRACT | Status: AC
  Administered 2024-09-06 – 2024-09-07 (×4): 2 via RESPIRATORY_TRACT
  Filled 2024-09-05: qty 5.9

## 2024-09-05 MED ORDER — PHENYLEPHRINE 80 MCG/ML (10ML) SYRINGE FOR IV PUSH (FOR BLOOD PRESSURE SUPPORT)
PREFILLED_SYRINGE | INTRAVENOUS | Status: AC
  Filled 2024-09-05: qty 10

## 2024-09-05 MED ORDER — OXYCODONE HCL 5 MG PO TABS
2.5000 mg | ORAL_TABLET | Freq: Four times a day (QID) | ORAL | Status: AC | PRN
  Administered 2024-09-06 – 2024-09-07 (×3): 2.5 mg via ORAL
  Filled 2024-09-05 (×3): qty 1

## 2024-09-05 MED ORDER — QUETIAPINE FUMARATE 25 MG PO TABS
25.0000 mg | ORAL_TABLET | Freq: Every day | ORAL | Status: AC
  Administered 2024-09-05 – 2024-09-07 (×3): 25 mg via ORAL
  Filled 2024-09-05 (×3): qty 1

## 2024-09-05 MED ORDER — POTASSIUM CHLORIDE 20 MEQ PO PACK
20.0000 meq | PACK | Freq: Once | ORAL | Status: AC
  Administered 2024-09-05: 20 meq via ORAL
  Filled 2024-09-05: qty 1

## 2024-09-05 MED ORDER — CEFAZOLIN SODIUM-DEXTROSE 2-4 GM/100ML-% IV SOLN
2.0000 g | INTRAVENOUS | Status: AC
  Administered 2024-09-05: 2 g via INTRAVENOUS
  Filled 2024-09-05: qty 100

## 2024-09-05 MED ORDER — 0.9 % SODIUM CHLORIDE (POUR BTL) OPTIME
TOPICAL | Status: DC | PRN
  Administered 2024-09-05: 1000 mL

## 2024-09-05 MED ORDER — DEXAMETHASONE SOD PHOSPHATE PF 10 MG/ML IJ SOLN
INTRAMUSCULAR | Status: AC
  Filled 2024-09-05: qty 1

## 2024-09-05 NOTE — Progress Notes (Signed)
 Dr. Boone aware of patient's CBC result. Hemoglobin 7.9. No new orders received.

## 2024-09-05 NOTE — Progress Notes (Addendum)
 Patient seen and examined, admitted earlier this morning by Dr. Franky, please see his H&P for details, briefly 88/M with history of CAD, COPD, chronic anemia, paroxysmal A-fib, recent hospitalization with CVA, residual right hemiparesis, dysphagia, delirium and agitation ultimately discharged to rehab, has been largely bedbound was trying to reach for remote control when he fell to the floor, denies loss of consciousness, brought to the ED noted to have left tibia and distal fibular fracture, orthopedics was consulted, labs noted hemoglobin of 8.8, creatinine 1.1, CT head and C-spine negative for acute findings, troponin 19.  Left tibia/fibular fracture following mechanical fall - Orthopedics consulting, plan for surgery today, Xarelto  on hold - Caution with excessive sedation postop, he is at high risk of delirium  CAD - Stable, continue beta-blocker and statin, discussed with RN to give dose of Toprol  prior to OR  Paroxysmal A-fib - In A-fib at this time, heart rate suboptimal, has not gotten his a.m. meds, Xarelto  on hold - Discussed with RN to give Toprol  soon  Recent CVA Residual right hemiparesis - PT OT eval  Cognitive deficits - High risk of delirium, was agitated during his recent hospitalization and started on Seroquel  - Caution with high risk meds  COPD - Stable, continue home nebs  BPH - Continue Flomax , required Foley catheter during recent hospitalization - Monitor for retention  Chronic anemia - Type and screen, monitor  DNR May need palliative care eval this admission if he has worsening failure to thrive  Sigurd Pac, MD

## 2024-09-05 NOTE — Interval H&P Note (Signed)
 History and Physical Interval Note:  09/05/2024 11:25 AM  Larry Briggs  has presented today for surgery, with the diagnosis of Left tibia/fibula fracture.  The various methods of treatment have been discussed with the patient and family. After consideration of risks, benefits and other options for treatment, the patient has consented to  Procedures: INSERTION, INTRAMEDULLARY ROD, TIBIA (Left) as a surgical intervention.  The patient's history has been reviewed, patient examined, no change in status, stable for surgery.  I have reviewed the patient's chart and labs.  Questions were answered to the patient's satisfaction.     Currie Dennin P Aymara Sassi

## 2024-09-05 NOTE — Telephone Encounter (Signed)
 Patient is returning a call to Douglas regarding previous message.  Please call her back at [phone].  Thanks.

## 2024-09-05 NOTE — Anesthesia Preprocedure Evaluation (Addendum)
 "                                  Anesthesia Evaluation  Patient identified by MRN, date of birth, ID band Patient confused  General Assessment Comment:Patient oriented to self and year, not to place or procedure  Reviewed: Allergy & Precautions, NPO status , Patient's Chart, lab work & pertinent test results  History of Anesthesia Complications Negative for: history of anesthetic complications  Airway Mallampati: III  TM Distance: >3 FB Neck ROM: Full    Dental  (+) Edentulous Upper, Edentulous Lower   Pulmonary neg sleep apnea, COPD, Patient abstained from smoking.Not current smoker, former smoker   Pulmonary exam normal breath sounds clear to auscultation       Cardiovascular Exercise Tolerance: Good METShypertension, + CAD and + Past MI  + dysrhythmias Atrial Fibrillation  Rhythm:Irregular Rate:Normal - Systolic murmurs    Neuro/Psych  PSYCHIATRIC DISORDERS     Dementia Right hemiparesis CVA, Residual Symptoms    GI/Hepatic ,neg GERD  ,,(+)     (-) substance abuse    Endo/Other  neg diabetes    Renal/GU negative Renal ROS     Musculoskeletal   Abdominal   Peds  Hematology   Anesthesia Other Findings Past Medical History: No date: CAD (coronary artery disease)     Comment:  Remote MI in 1980. CABG 1995. Stent to left main in               2004. Last cath in April of 2012. EF 50%; patent LIMA to               DX/LAD with collateralization of the distal right, patent              SVG to OM, occluded SVG to distal RCA, and patent stent               to LAD No date: COPD (chronic obstructive pulmonary disease) (HCC) No date: Dyspnea No date: Dysrhythmia 08/02/1978: History of heart attack No date: Hypercholesteremia No date: Hypertension No date: Increased glucose level No date: Myocardial infarction (HCC) No date: Obesity No date: Pneumonia No date: Stroke [facility])  Reproductive/Obstetrics                               Anesthesia Physical Anesthesia Plan  ASA: 3  Anesthesia Plan: General   Post-op Pain Management: Ofirmev  IV (intra-op)*   Induction: Intravenous  PONV Risk Score and Plan: 3 and Ondansetron , Dexamethasone  and Treatment may vary due to age or medical condition  Airway Management Planned: Oral ETT  Additional Equipment: None  Intra-op Plan:   Post-operative Plan: Extubation in OR  Informed Consent: I have reviewed the patients History and Physical, chart, labs and discussed the procedure including the risks, benefits and alternatives for the proposed anesthesia with the patient or authorized representative who has indicated his/her understanding and acceptance.   Patient has DNR.  Discussed DNR with power of attorney, Discussed DNR with patient and Continue DNR.   Dental advisory given  Plan Discussed with: CRNA and Surgeon  Anesthesia Plan Comments: (Discussed with surgeon who wanted this patient mobilized today for physical therapy, thus I will not perform nerve blocks to spare any motor blockade. Discussed in detail patient's DNR status. After explaining the nuances and differences of cardiac arrest in the perioperative setting  vs anywhere else, the patient and wife decided to continue the DNR in the perioperative period.   Discussed risks of anesthesia with patient as well as wife at bedside, including PONV, sore throat, lip/dental/eye damage. Rare risks discussed as well, such as cardiorespiratory and neurological sequelae, and allergic reactions. Discussed the role of CRNA in patient's perioperative care. Patient understands, wife provides consent due to patient's confusion.)         Anesthesia Quick Evaluation  "

## 2024-09-05 NOTE — Anesthesia Postprocedure Evaluation (Signed)
"   Anesthesia Post Note  Patient: Larry Briggs  Procedure(s) Performed: INSERTION, INTRAMEDULLARY ROD, TIBIA (Left)     Patient location during evaluation: PACU Anesthesia Type: General Level of consciousness: awake and alert Pain management: pain level controlled Vital Signs Assessment: post-procedure vital signs reviewed and stable Respiratory status: spontaneous breathing, nonlabored ventilation, respiratory function stable and patient connected to nasal cannula oxygen Cardiovascular status: blood pressure returned to baseline and stable Postop Assessment: no apparent nausea or vomiting Anesthetic complications: no   There were no known notable events for this encounter.  Last Vitals:  Vitals:   09/05/24 1400 09/05/24 1416  BP: (!) 146/81 (!) 133/99  Pulse: 87 87  Resp: 16 13  Temp:    SpO2: 98% 100%    Last Pain:  Vitals:   09/05/24 1058  TempSrc: Oral  PainSc:                  Rome Ade      "

## 2024-09-05 NOTE — Progress Notes (Signed)
 Per Ollie, RN she gave the pt 2 spoonfuls of applesauce with the scheduled medications at 1000. Dr. Boone with anesthesia made aware, and he said that it was okay to proceed with surgery.

## 2024-09-05 NOTE — Discharge Instructions (Signed)
 "  Orthopaedic Trauma Service Discharge Instructions   General Discharge Instructions  WEIGHT BEARING STATUS:weightbearing as tolerated in Cam boot  RANGE OF MOTION/ACTIVITY: ok for knee and ankle motion as tolerated  Wound Care: You may remove your surgical dressing on post op day 3 . Incisions can be left open to air if there is no drainage. Once the incision is completely dry and without drainage, it may be left open to air out.  Showering may begin Monday 09/10/24.  Clean incision gently with soap and water .  DVT/PE prophylaxis: Xarelto   Diet: as you were eating previously.  Can use over the counter stool softeners and bowel preparations, such as Miralax , to help with bowel movements.  Narcotics can be constipating.  Be sure to drink plenty of fluids  PAIN MEDICATION USE AND EXPECTATIONS  You have likely been given narcotic medications to help control your pain.  After a traumatic event that results in an fracture (broken bone) with or without surgery, it is ok to use narcotic pain medications to help control one's pain.  We understand that everyone responds to pain differently and each individual patient will be evaluated on a regular basis for the continued need for narcotic medications. Ideally, narcotic medication use should last no more than 6-8 weeks (coinciding with fracture healing).   As a patient it is your responsibility as well to monitor narcotic medication use and report the amount and frequency you use these medications when you come to your office visit.   We would also advise that if you are using narcotic medications, you should take a dose prior to therapy to maximize you participation.  IF YOU ARE ON NARCOTIC MEDICATIONS IT IS NOT PERMISSIBLE TO OPERATE A MOTOR VEHICLE (MOTORCYCLE/CAR/TRUCK/MOPED) OR HEAVY MACHINERY DO NOT MIX NARCOTICS WITH OTHER CNS (CENTRAL NERVOUS SYSTEM) DEPRESSANTS SUCH AS ALCOHOL  POST-OPERATIVE OPIOID TAPER INSTRUCTIONS: It is important to wean  off of your opioid medication as soon as possible. If you do not need pain medication after your surgery it is ok to stop day one. Opioids include: Codeine, Hydrocodone (Norco, Vicodin), Oxycodone (Percocet, oxycontin ) and hydromorphone amongst others.  Long term and even short term use of opiods can cause: Increased pain response Dependence Constipation Depression Respiratory depression And more.  Withdrawal symptoms can include Flu like symptoms Nausea, vomiting And more Techniques to manage these symptoms Hydrate well Eat regular healthy meals Stay active Use relaxation techniques(deep breathing, meditating, yoga) Do Not substitute Alcohol to help with tapering If you have been on opioids for less than two weeks and do not have pain than it is ok to stop all together.  Plan to wean off of opioids This plan should start within one week post op of your fracture surgery  Maintain the same interval or time between taking each dose and first decrease the dose.  Cut the total daily intake of opioids by one tablet each day Next start to increase the time between doses. The last dose that should be eliminated is the evening dose.    STOP SMOKING OR USING NICOTINE PRODUCTS!!!!  As discussed nicotine severely impairs your body's ability to heal surgical and traumatic wounds but also impairs bone healing.  Wounds and bone heal by forming microscopic blood vessels (angiogenesis) and nicotine is a vasoconstrictor (essentially, shrinks blood vessels).  Therefore, if vasoconstriction occurs to these microscopic blood vessels they essentially disappear and are unable to deliver necessary nutrients to the healing tissue.  This is one modifiable factor that you can do  to dramatically increase your chances of healing your injury.  (This means no smoking, no nicotine gum, patches, etc)  DO NOT USE NONSTEROIDAL ANTI-INFLAMMATORY DRUGS (NSAID'S)  Using products such as Advil (ibuprofen), Aleve  (naproxen), Motrin (ibuprofen) for additional pain control during fracture healing can delay and/or prevent the healing response.  If you would like to take over the counter (OTC) medication, Tylenol  (acetaminophen ) is ok.  However, some narcotic medications that are given for pain control contain acetaminophen  as well. Therefore, you should not exceed more than 4000 mg of tylenol  in a day if you do not have liver disease.  Also note that there are may OTC medicines, such as cold medicines and allergy medicines that my contain tylenol  as well.  If you have any questions about medications and/or interactions please ask your doctor/PA or your pharmacist.      ICE AND ELEVATE INJURED/OPERATIVE EXTREMITY  Using ice and elevating the injured extremity above your heart can help with swelling and pain control.  Icing in a pulsatile fashion, such as 20 minutes on and 20 minutes off, can be followed.    Do not place ice directly on skin. Make sure there is a barrier between to skin and the ice pack.    Using frozen items such as frozen peas works well as the conform nicely to the are that needs to be iced.  USE AN ACE WRAP OR TED HOSE FOR SWELLING CONTROL  In addition to icing and elevation, Ace wraps or TED hose are used to help limit and resolve swelling.  It is recommended to use Ace wraps or TED hose until you are informed to stop.    When using Ace Wraps start the wrapping distally (farthest away from the body) and wrap proximally (closer to the body)   Example: If you had surgery on your leg or thing and you do not have a splint on, start the ace wrap at the toes and work your way up to the thigh        If you had surgery on your upper extremity and do not have a splint on, start the ace wrap at your fingers and work your way up to the upper arm  IF YOU ARE IN A CAM BOOT (BLACK BOOT)  You may remove boot periodically. Perform daily dressing changes as noted below.  Wash the liner of the boot regularly  and wear a sock when wearing the boot. It is recommended that you sleep in the boot until told otherwise   CALL THE OFFICE FOR MEDICATION REFILLS OR WITH ANY QUESTIONS/CONCERNS: [phone]   VISIT OUR WEBSITE FOR ADDITIONAL INFORMATION: [email or web address]    Discharge Wound Care Instructions  Do NOT apply any ointments, solutions or lotions to pin sites or surgical wounds.  These prevent needed drainage and even though solutions like hydrogen peroxide kill bacteria, they also damage cells lining the pin sites that help fight infection.  Applying lotions or ointments can keep the wounds moist and can cause them to breakdown and open up as well. This can increase the risk for infection. When in doubt call the office.  Surgical incisions should be dressed daily.  If any drainage is noted, use one layer of adaptic or Mepitel, then gauze, Kerlix, and an ace wrap. - These dressing supplies should be available at local medical supply stores ([employer] [employer], [facility], etc) as well as insurance claims handler ([employer], [facility], [employer], etc)  Once the incision is completely dry and without drainage,  it may be left open to air out.  Showering may begin 36-48 hours later.  Cleaning gently with soap and water .  Traumatic wounds should be dressed daily as well.    One layer of adaptic, gauze, Kerlix, then ace wrap.  The adaptic can be discontinued once the draining has ceased    If you have a wet to dry dressing: wet the gauze with saline the squeeze as much saline out so the gauze is moist (not soaking wet), place moistened gauze over wound, then place a dry gauze over the moist one, followed by Kerlix wrap, then ace wrap.    Call office for the following: Temperature greater than 101F Persistent nausea and vomiting Severe uncontrolled pain Redness, tenderness, or signs of infection (pain, swelling, redness, odor or green/yellow discharge around the site) Difficulty breathing, headache or visual  disturbances Hives Persistent dizziness or light-headedness Extreme fatigue Any other questions or concerns you may have after discharge  In an emergency, call 911 or go to an Emergency Department at a nearby hospital  OTHER HELPFUL INFORMATION  If you had a block, it will wear off between 8-24 hrs postop typically.  This is period when your pain may go from nearly zero to the pain you would have had postop without the block.  This is an abrupt transition but nothing dangerous is happening.  You may take an extra dose of narcotic when this happens.  You should wean off your narcotic medicines as soon as you are able.  Most patients will be off or using minimal narcotics before their first postop appointment.   We suggest you use the pain medication the first night prior to going to bed, in order to ease any pain when the anesthesia wears off. You should avoid taking pain medications on an empty stomach as it will make you nauseous.  Do not drink alcoholic beverages or take illicit drugs when taking pain medications.  In most states it is against the law to drive while you are in a splint or sling.  And certainly against the law to drive while taking narcotics.  You may return to work/school in the next couple of days when you feel up to it.   Pain medication may make you constipated.  Below are a few solutions to try in this order: Decrease the amount of pain medication if you arent having pain. Drink lots of decaffeinated fluids. Drink prune juice and/or each dried prunes  If the first 3 dont work start with additional solutions Take Colace - an over-the-counter stool softener Take Senokot - an over-the-counter laxative Take Miralax  - a stronger over-the-counter laxative     "

## 2024-09-05 NOTE — Anesthesia Procedure Notes (Signed)
 Procedure Name: Intubation Date/Time: 09/05/2024 12:17 PM  Performed by: Obrian Bulson L, CRNAPre-anesthesia Checklist: Patient identified, Emergency Drugs available, Suction available and Patient being monitored Patient Re-evaluated:Patient Re-evaluated prior to induction Oxygen Delivery Method: Circle System Utilized Preoxygenation: Pre-oxygenation with 100% oxygen Induction Type: IV induction Ventilation: Mask ventilation without difficulty Laryngoscope Size: Mac and 4 Grade View: Grade I Tube type: Oral Tube size: 8.0 mm Number of attempts: 1 Airway Equipment and Method: Stylet and Oral airway Placement Confirmation: ETT inserted through vocal cords under direct vision, positive ETCO2 and breath sounds checked- equal and bilateral Secured at: 22 cm Tube secured with: Tape Dental Injury: Teeth and Oropharynx as per pre-operative assessment

## 2024-09-05 NOTE — Op Note (Addendum)
 Orthopaedic Surgery Operative Note (CSN: [id number] ) Date of Surgery: 09/05/2024  Admit Date: 09/04/2024   Diagnoses: Pre-Op Diagnoses: Left distal tibial shaft fracture Left distal fibular shaft fracture  Post-Op Diagnosis: Same  Procedures: CPT 27759-Intramedullary nailing of left tibia fracture CPT 27781-Nonoperative management of left fibula fracture  Surgeons : Primary: Kendal Franky SQUIBB, MD  Assistant: Lauraine Moores, PA-C  Location: OR 3   Anesthesia: General   Antibiotics: Ancef  2g preop with 1 gm vancomycin  powder placed topically   Tourniquet time: None    Estimated Blood Loss: 50 mL  Complications:* No complications entered in OR log *   Specimens:* No specimens in log *   Implants: Implant Name Type Inv. Item Serial No. Manufacturer Lot No. LRB No. Used Action  NAIL TIB T2 [id number] - [id number] Nail NAIL TIB T2 10X345  [employer] [id number] Left 1 Implanted  SCREW LOCK ADV 5X45 - [id number] Screw SCREW LOCK ADV 5X45  [employer] [id number] Left 1 Implanted  SCREW LOCK ADV 5X55 - [id number] Screw SCREW LOCK ADV 5X55  [employer] [id number] Left 1 Implanted  SCREW LOCK ADV 5X60 - [id number] Screw SCREW LOCK ADV 5X60  [employer] [id number] Left 1 Implanted  SCREW LOCK ADV 5X37.5 - [id number] Screw SCREW LOCK ADV 5X37.5  [employer] [id number] Left 1 Implanted     Indications for Surgery: 89 year old male who sustained a ground-level fall with a left tibial shaft fracture and left distal fibula fracture.  Due to the unstable nature of his injuries I recommend proceeding with intramedullary nailing of his right tibia with possible reduction internal fixation of his fibula.  Risks and benefits were discussed with the patient's wife.  Risks include but not limited to bleeding, infection, malunion, nonunion, hardware failure, hardware irritation, nerve or blood vessel injury, DVT, and the possibility anesthetic complications.  She agreed to proceed  with surgery and consent was obtained.  Operative Findings: 1.  Intramedullary nailing of left tibial shaft fracture using [employer] T2 tibial nail 10 x 345 mm 2.  Nonoperative management of left fibular shaft fracture.  Procedure: The patient was identified in the preoperative holding area. Consent was confirmed with the patient and their family and all questions were answered. The operative extremity was marked after confirmation with the patient. he was then brought back to the operating room by our anesthesia colleagues.  He was placed under general anesthetic and carefully transferred over to a radiolucent flattop table.  A bump was placed under his operative hip.  The left lower extremity was then prepped and draped in usual sterile fashion.  Timeout was performed to verify the patient, the procedure, and the extremity.  Preoperative antibiotics were dosed.  The lateral parapatellar incision was made and carried down through skin and subcutaneous tissue.  I incised through the retinaculum and mobilized patella medially.  I then directed a threaded guidewire to appropriate starting point.  I then used an awl to advanced into the metaphysis.  I then passed a ball-tipped guidewire down the central canal and seated it into the distal metaphysis.  I measured the length and decided to use a 345 mm nail.  I then sequentially reamed from 9 mm to 11.5 mm.  I was then able to place the nail attached to a targeting arm.  I used perfect circle technique to place 2 medial to lateral distal interlocking screws.  These locked into the nail.  I then used the targeting arm to place 2 proximal interlocking screws.  The  targeting arm was removed and final fluoroscopic imaging was obtained.  The fibula fracture did not move and there is no instability at the ankle joint.  The incisions were copiously irrigated.  A gram of vancomycin  powder was placed into the incision.  A layered closure of 0 Vicryl, 2-0 Monocryl and 3-0  Monocryl Dermabond was used to close the skin.  Sterile dressings were applied.  Post Op Plan/Instructions: The patient will be weightbearing as tolerated to the left lower extremity.  He will receive postoperative Ancef .  He be placed on his home DOAC for DVT prophylaxis.  We will have him mobilize with physical and Occupational Therapy.  I was present and performed the entire surgery.  Lauraine Moores, PA-C did assist me throughout the case. An assistant was necessary given the difficulty in approach, maintenance of reduction and ability to instrument the fracture.   Franky Light, MD [facility]

## 2024-09-05 NOTE — Progress Notes (Signed)
 Orthopedic Tech Progress Note Patient Details:  Larry Briggs 11-Mar-1936 [id number]  Ortho Devices Type of Ortho Device: CAM walker Ortho Device/Splint Location: LLE Ortho Device/Splint Interventions: Ordered, Application, Adjustment   Post Interventions Patient Tolerated: Fair Instructions Provided: Care of device  [name] Pac 09/05/2024, 4:01 PM

## 2024-09-05 NOTE — Transfer of Care (Signed)
 Immediate Anesthesia Transfer of Care Note  Patient: Larry Briggs  Procedure(s) Performed: INSERTION, INTRAMEDULLARY ROD, TIBIA (Left)  Patient Location: PACU  Anesthesia Type:General  Level of Consciousness: awake, sedated, and responds to stimulation  Airway & Oxygen Therapy: Patient Spontanous Breathing and Patient connected to nasal cannula oxygen  Post-op Assessment: Report given to RN, Post -op Vital signs reviewed and stable, and Patient moving all extremities  Post vital signs: Reviewed and stable  Last Vitals:  Vitals Value Taken Time  BP 151/100 09/05/24 13:49  Temp    Pulse 93 09/05/24 13:52  Resp 25 09/05/24 13:52  SpO2 91 % 09/05/24 13:52  Vitals shown include unfiled device data.  Last Pain:  Vitals:   09/05/24 1058  TempSrc: Oral  PainSc:       Patients Stated Pain Goal: 0 (09/05/24 1055)  Complications: There were no known notable events for this encounter.

## 2024-09-05 NOTE — H&P (View-Only) (Signed)
 Reason for Consult:Left tibia fx Referring Physician: Sigurd Pac Time called: [phone] Time at bedside: 1025   Larry Briggs is an 89 y.o. male.  HPI: Eyal bent down to pick up a remote battery that fell on the floor and fell himself. He had immediate left leg pain. He was brought to the ED where x-rays showed a tibia fx and orthopedic surgery was consulted. He's currently in rehab at a SNF 2/2 CVA. He is somewhat demented and cannot contribute to history.  Past Medical History:  Diagnosis Date   CAD (coronary artery disease)    Remote MI in 1980. CABG 1995. Stent to left main in 2004. Last cath in April of 2012. EF 50%; patent LIMA to DX/LAD with collateralization of the distal right, patent SVG to OM, occluded SVG to distal RCA, and patent stent to LAD   History of heart attack 1980   Hypercholesteremia    Hypertension    Increased glucose level    Obesity     Past Surgical History:  Procedure Laterality Date   CARDIAC CATHETERIZATION  1993   CARDIAC CATHETERIZATION  April 2012   Mild reduction if EF at 50%. Patent LIMA to DX/LAD with collateralization to distal RCA, patent SVG to OM and occluded SVG to distal right and patent stent to LAD/Left main;   CORONARY ARTERY BYPASS GRAFT  05/1994   x5 LIMA to LAD & DX, SVG to LCX, SVG to RCA   CORONARY STENT PLACEMENT  2004   Stent to the L main   INTRAMEDULLARY (IM) NAIL INTERTROCHANTERIC Left 09/05/2017   Procedure: INTRAMEDULLARY (IM) NAIL INTERTROCHANTRIC;  Surgeon: Beverley Evalene BIRCH, MD;  Location: [facility] OR;  Service: Orthopedics;  Laterality: Left;    Family History  Problem Relation Age of Onset   Heart attack Father    Hypertension Father    Stroke Mother     Social History:  reports that he quit smoking about 30 years ago. His smoking use included cigarettes. He started smoking about 71 years ago. He has a 30.7 pack-year smoking history. He has never used smokeless tobacco. He reports that he does not drink alcohol and  does not use drugs.  Allergies: Allergies[1]  Medications: I have reviewed the patient's current medications.  Results for orders placed or performed during the hospital encounter of 09/04/24 (from the past 48 hours)  Troponin T, High Sensitivity     Status: Abnormal   Collection Time: 09/04/24 12:15 AM  Result Value Ref Range   Troponin T High Sensitivity 92 (H) 0 - 19 ng/L    Comment: (NOTE) Biotin concentrations > 1000 ng/mL falsely decrease TnT results.  Serial cardiac troponin measurements are suggested.  Refer to the Links section for chest pain algorithms and additional  guidance. Performed at [facility], [address] [place], [place] [address]   CBC with Differential     Status: Abnormal   Collection Time: 09/04/24  9:18 PM  Result Value Ref Range   WBC 8.7 4.0 - 10.5 K/uL   RBC 3.44 (L) 4.22 - 5.81 MIL/uL   Hemoglobin 8.8 (L) 13.0 - 17.0 g/dL   HCT 70.6 (L) 60.9 - 47.9 %   MCV 85.2 80.0 - 100.0 fL   MCH 25.6 (L) 26.0 - 34.0 pg   MCHC 30.0 30.0 - 36.0 g/dL   RDW 85.0 88.4 - 84.4 %   Platelets 361 150 - 400 K/uL   nRBC 0.0 0.0 - 0.2 %   Neutrophils Relative %  85 %   Neutro Abs 7.4 1.7 - 7.7 K/uL   Lymphocytes Relative 6 %   Lymphs Abs 0.5 (L) 0.7 - 4.0 K/uL   Monocytes Relative 8 %   Monocytes Absolute 0.7 0.1 - 1.0 K/uL   Eosinophils Relative 0 %   Eosinophils Absolute 0.0 0.0 - 0.5 K/uL   Basophils Relative 1 %   Basophils Absolute 0.0 0.0 - 0.1 K/uL   Immature Granulocytes 0 %   Abs Immature Granulocytes 0.03 0.00 - 0.07 K/uL    Comment: Performed at [facility], [address], [place], [place] [address]  Comprehensive metabolic panel     Status: Abnormal   Collection Time: 09/04/24  9:18 PM  Result Value Ref Range   Sodium 140 135 - 145 mmol/L   Potassium 3.4 (L) 3.5 - 5.1 mmol/L   Chloride 105 98 - 111 mmol/L   CO2 23 22 - 32 mmol/L   Glucose, Bld 133 (H) 70 - 99 mg/dL    Comment: Glucose reference range applies only to samples  taken after fasting for at least 8 hours.   BUN 23 8 - 23 mg/dL   Creatinine, Ser 8.83 0.61 - 1.24 mg/dL   Calcium  8.4 (L) 8.9 - 10.3 mg/dL   Total Protein 6.4 (L) 6.5 - 8.1 g/dL   Albumin  3.2 (L) 3.5 - 5.0 g/dL   AST 22 15 - 41 U/L   ALT 11 0 - 44 U/L   Alkaline Phosphatase 106 38 - 126 U/L   Total Bilirubin 0.8 0.0 - 1.2 mg/dL   GFR, Estimated >39 >39 mL/min    Comment: (NOTE) Calculated using the CKD-EPI Creatinine Equation (2021)    Anion gap 12 5 - 15    Comment: Performed at [facility], [address], [place], [place] [address]  Troponin T, High Sensitivity     Status: Abnormal   Collection Time: 09/04/24  9:18 PM  Result Value Ref Range   Troponin T High Sensitivity 99 (H) 0 - 19 ng/L    Comment: (NOTE) Biotin concentrations > 1000 ng/mL falsely decrease TnT results.  Serial cardiac troponin measurements are suggested.  Refer to the Links section for chest pain algorithms and additional  guidance. Performed at [facility], [address], [place], [place] [address]   I-Stat venous blood gas, [facility], [facility], [facility])     Status: Abnormal   Collection Time: 09/04/24  9:25 PM  Result Value Ref Range   pH, Ven 7.408 7.25 - 7.43   pCO2, Ven 37.8 (L) 44 - 60 mmHg   pO2, Ven 38 32 - 45 mmHg   Bicarbonate 23.8 20.0 - 28.0 mmol/L   TCO2 25 22 - 32 mmol/L   O2 Saturation 73 %   Acid-base deficit 1.0 0.0 - 2.0 mmol/L   Sodium 141 135 - 145 mmol/L   Potassium 3.4 (L) 3.5 - 5.1 mmol/L   Calcium , Ion 1.10 (L) 1.15 - 1.40 mmol/L   HCT 28.0 (L) 39.0 - 52.0 %   Hemoglobin 9.5 (L) 13.0 - 17.0 g/dL   Sample type VENOUS    Comment NOTIFIED PHYSICIAN   POC CBG, ED     Status: Abnormal   Collection Time: 09/04/24 10:30 PM  Result Value Ref Range   Glucose-Capillary 114 (H) 70 - 99 mg/dL    Comment: Glucose reference range applies only to samples taken after fasting for at least 8 hours.  CBC     Status:  Abnormal   Collection Time: 09/05/24 12:15 AM  Result Value Ref  Range   WBC 7.9 4.0 - 10.5 K/uL   RBC 3.08 (L) 4.22 - 5.81 MIL/uL   Hemoglobin 7.9 (L) 13.0 - 17.0 g/dL   HCT 72.9 (L) 60.9 - 47.9 %   MCV 87.7 80.0 - 100.0 fL   MCH 25.6 (L) 26.0 - 34.0 pg   MCHC 29.3 (L) 30.0 - 36.0 g/dL   RDW 85.0 88.4 - 84.4 %   Platelets 349 150 - 400 K/uL   nRBC 0.0 0.0 - 0.2 %    Comment: Performed at [facility], [address] [place], [place] [address]  Basic metabolic panel     Status: Abnormal   Collection Time: 09/05/24 12:15 AM  Result Value Ref Range   Sodium 135 135 - 145 mmol/L   Potassium 4.2 3.5 - 5.1 mmol/L   Chloride 102 98 - 111 mmol/L   CO2 16 (L) 22 - 32 mmol/L   Glucose, Bld 129 (H) 70 - 99 mg/dL    Comment: Glucose reference range applies only to samples taken after fasting for at least 8 hours.   BUN 8 8 - 23 mg/dL   Creatinine, Ser 9.24 0.61 - 1.24 mg/dL   Calcium  8.6 (L) 8.9 - 10.3 mg/dL   GFR, Estimated >39 >39 mL/min    Comment: (NOTE) Calculated using the CKD-EPI Creatinine Equation (2021)    Anion gap 17 (H) 5 - 15    Comment: Performed at [facility], [address] [place], [place] [address]  Type and screen [facility]     Status: None   Collection Time: 09/05/24  7:01 AM  Result Value Ref Range   ABO/RH(D) A POS    Antibody Screen NEG    Sample Expiration      09/08/2024,2359 Performed at [facility], [address], [place], [place] [address]     DG Knee Complete 4 Views Left Result Date: 09/04/2024 CLINICAL DATA:  Pain after fall.  Tender to palpation. EXAM: LEFT KNEE - COMPLETE 4+ VIEW COMPARISON:  Tibia/fibula radiograph earlier today FINDINGS: Lateral view is limited by difficulty with positioning. No evidence of acute fracture or dislocation. The bones are subjectively under mineralized. No large joint effusion. Soft tissue edema and vascular calcifications. IMPRESSION: 1. No acute fracture or dislocation of the left knee. 2. Soft tissue edema and vascular calcifications.  Electronically Signed   By: Andrea Gasman M.D.   On: 09/04/2024 22:46   DG Femur Min 2 Views Left Result Date: 09/04/2024 CLINICAL DATA:  Pain after fall.  Tender to palpation. EXAM: LEFT FEMUR 2 VIEWS COMPARISON:  Pelvis and hip radiograph earlier today FINDINGS: Femoral intramedullary nail with trans trochanteric and distal locking screw fixation. Remote intertrochanteric femur fracture. No acute fracture. No periprosthetic lucency. Vascular calcifications. Mild soft tissue edema. IMPRESSION: 1. No acute fracture of the left femur. 2. Remote intertrochanteric femur fracture post ORIF. Electronically Signed   By: Andrea Gasman M.D.   On: 09/04/2024 22:45   DG Foot Complete Left Result Date: 09/04/2024 EXAM: 3 OR MORE VIEW(S) XRAY OF THE LEFT FOOT 09/04/2024 10:33:19 PM COMPARISON: None available. CLINICAL HISTORY: FINDINGS: BONES AND JOINTS: There is questionable subtle cortical lucency through the medial base of the 5th distal phalanx. Nondisplaced fracture is not excluded. There is no dislocation. SOFT TISSUES: Vascular calcifications. IMPRESSION: 1. Questionable nondisplaced fracture of the medial base of the 5th distal phalanx.  Electronically signed by: Greig Pique MD 09/04/2024 10:44 PM EST RP Workstation: [id number]   DG Tibia/Fibula Left Result Date: 09/04/2024 EXAM: _VIEWS_ VIEW(S) XRAY OF THE LEFT TIBIA AND FIBULA 09/04/2024 10:33:19 PM COMPARISON: None available. CLINICAL HISTORY: FINDINGS: BONES AND JOINTS: Partially visualized intramedullary rod in distal femur. There is acute oblique fracture of the distal diaphysis of the tibia. Fracture fragments are distracted 6 mm. There is apex posterior angulation. There is comminuted fracture of the distal fibular diaphysis with mild apex posterior angulation. No dislocation. SOFT TISSUES: There is soft tissue swelling surrounding the fracture. Vascular calcifications. IMPRESSION: 1. Acute oblique fracture of the distal tibial diaphysis with 6 mm  distraction and apex posterior angulation. 2. Comminuted fracture of the distal fibular diaphysis with mild apex posterior angulation. Electronically signed by: Greig Pique MD 09/04/2024 10:42 PM EST RP Workstation: [id number]   CT Cervical Spine Wo Contrast Result Date: 09/04/2024 EXAM: CT CERVICAL SPINE WITHOUT CONTRAST 09/04/2024 09:50:00 PM TECHNIQUE: CT of the cervical spine was performed without the administration of intravenous contrast. Multiplanar reformatted images are provided for review. Automated exposure control, iterative reconstruction, and/or weight based adjustment of the mA/kV was utilized to reduce the radiation dose to as low as reasonably achievable. COMPARISON: None available. CLINICAL HISTORY: Neck trauma (Age >= 65y). Neck trauma; Age >= 65y. FINDINGS: BONES AND ALIGNMENT: No acute fracture or traumatic malalignment. DEGENERATIVE CHANGES: Moderate to advanced disc space height loss at C6-C7. Advanced facet arthropathy on the left throughout the cervical spine. No severe spinal canal narrowing. SOFT TISSUES: No prevertebral soft tissue swelling. IMPRESSION: 1. No evidence of acute traumatic injury. Electronically signed by: Norman Gatlin MD 09/04/2024 10:01 PM EST RP Workstation: [id number]   CT Head Wo Contrast Result Date: 09/04/2024 EXAM: CT HEAD WITHOUT CONTRAST 09/04/2024 09:50:00 PM TECHNIQUE: CT of the head was performed without the administration of intravenous contrast. Automated exposure control, iterative reconstruction, and/or weight based adjustment of the mA/kV was utilized to reduce the radiation dose to as low as reasonably achievable. COMPARISON: 07/17/2024 CLINICAL HISTORY: Head trauma, moderate-severe. FINDINGS: BRAIN AND VENTRICLES: No acute hemorrhage. No evidence of acute infarct. Stable parenchymal volume loss with prominent ventricular system and extra-axial CSF spaces. Stable patchy supratentorial white matter hypodensity consistent with chronic small-vessel  ischemic change. Chronic infarct in the posterior limb of the left internal capsule. No mass effect or midline shift. ORBITS: Bilateral lens implants in place. SINUSES: Mild mucosal thickening in paranasal sinuses. SOFT TISSUES AND SKULL: No acute soft tissue abnormality. No skull fracture. Bilateral carotid siphon calcification. IMPRESSION: 1. No acute intracranial abnormality. Electronically signed by: Norman Gatlin MD 09/04/2024 09:59 PM EST RP Workstation: [id number]   DG Ankle Complete Left Result Date: 09/04/2024 EXAM: 3 OR MORE VIEW(S) XRAY OF THE LEFT ANKLE 09/04/2024 08:34:00 PM CLINICAL HISTORY: Pain. COMPARISON: None available. FINDINGS: BONES AND JOINTS: Acute oblique fracture of the distal left tibial diaphysis with apex posterior angulation and 2 cm of medial displacement of the distal fragment. Additional oblique fracture of the distal left fibular diaphysis with apex medial / posterior angulation. No widening at the ankle mortise. SOFT TISSUES: Soft tissue swelling. IMPRESSION: 1. Acute oblique fractures of the distal left tibia and fibula. Electronically signed by: Norman Gatlin MD 09/04/2024 08:41 PM EST RP Workstation: [id number]   DG HIP PORT UNILAT WITH PELVIS 1V LEFT Result Date: 09/04/2024 EXAM: 1 VIEW(S) XRAY OF THE PELVIS AND BILATERAL HIP 09/04/2024 08:34:00 PM COMPARISON: None available. CLINICAL HISTORY: Pain. FINDINGS: BONES AND JOINTS: Old healed left intertrochanteric  fracture status post open reduction and internal fixation. No evidence of acute fracture. No dislocation. LUMBAR SPINE: Degenerative changes of the lower lumbar spine. SOFT TISSUES: Vascular calcifications. STOMACH AND BOWEL: Large stool ball in the rectum. IMPRESSION: 1. No acute fracture or dislocation. 2. Old healed left intertrochanteric fracture status post open reduction and internal fixation. 3. Large stool ball in the rectum. Electronically signed by: Norman Gatlin MD 09/04/2024 08:39 PM EST RP Workstation:  [id number]    Review of Systems  Unable to perform ROS: Dementia   Blood pressure (!) 135/98, pulse 95, temperature (!) 97.4 F (36.3 C), temperature source Oral, resp. rate (!) 21, SpO2 100%. Physical Exam Constitutional:      General: He is not in acute distress.    Appearance: He is well-developed. He is not diaphoretic.  HENT:     Head: Normocephalic and atraumatic.  Eyes:     General: No scleral icterus.       Right eye: No discharge.        Left eye: No discharge.     Conjunctiva/sclera: Conjunctivae normal.  Cardiovascular:     Rate and Rhythm: Normal rate and regular rhythm.  Pulmonary:     Effort: Pulmonary effort is normal. No respiratory distress.  Musculoskeletal:     Cervical back: Normal range of motion.     Comments: LLE No traumatic wounds, ecchymosis, or rash  Short leg splint in place  No knee effusion  Knee stable to varus/ valgus and anterior/posterior stress  Sens DPN, SPN, TN could not assess  Motor EHL, ext, flex, evers could not assess  Toes perfused, No significant edema  Skin:    General: Skin is warm and dry.  Neurological:     Mental Status: He is alert.  Psychiatric:        Mood and Affect: Mood normal.        Behavior: Behavior normal.     Assessment/Plan: Left tibia fx -- Plan IMN today with Dr. Kendal. Please keep NPO.    Ozell DOROTHA Ned, PA-C Orthopedic Surgery [phone] 09/05/2024, 10:31 AM     [1]  Allergies Allergen Reactions   Viagra [Sildenafil]     Other reaction(s): blue haze   Iodinated Contrast Media Nausea And Vomiting and Rash

## 2024-09-05 NOTE — Consult Note (Signed)
 Reason for Consult:Left tibia fx Referring Physician: Sigurd Pac Time called: [phone] Time at bedside: 1025   Larry Briggs is an 89 y.o. male.  HPI: Eyal bent down to pick up a remote battery that fell on the floor and fell himself. He had immediate left leg pain. He was brought to the ED where x-rays showed a tibia fx and orthopedic surgery was consulted. He's currently in rehab at a SNF 2/2 CVA. He is somewhat demented and cannot contribute to history.  Past Medical History:  Diagnosis Date   CAD (coronary artery disease)    Remote MI in 1980. CABG 1995. Stent to left main in 2004. Last cath in April of 2012. EF 50%; patent LIMA to DX/LAD with collateralization of the distal right, patent SVG to OM, occluded SVG to distal RCA, and patent stent to LAD   History of heart attack 1980   Hypercholesteremia    Hypertension    Increased glucose level    Obesity     Past Surgical History:  Procedure Laterality Date   CARDIAC CATHETERIZATION  1993   CARDIAC CATHETERIZATION  April 2012   Mild reduction if EF at 50%. Patent LIMA to DX/LAD with collateralization to distal RCA, patent SVG to OM and occluded SVG to distal right and patent stent to LAD/Left main;   CORONARY ARTERY BYPASS GRAFT  05/1994   x5 LIMA to LAD & DX, SVG to LCX, SVG to RCA   CORONARY STENT PLACEMENT  2004   Stent to the L main   INTRAMEDULLARY (IM) NAIL INTERTROCHANTERIC Left 09/05/2017   Procedure: INTRAMEDULLARY (IM) NAIL INTERTROCHANTRIC;  Surgeon: Beverley Evalene BIRCH, MD;  Location: [facility] OR;  Service: Orthopedics;  Laterality: Left;    Family History  Problem Relation Age of Onset   Heart attack Father    Hypertension Father    Stroke Mother     Social History:  reports that he quit smoking about 30 years ago. His smoking use included cigarettes. He started smoking about 71 years ago. He has a 30.7 pack-year smoking history. He has never used smokeless tobacco. He reports that he does not drink alcohol and  does not use drugs.  Allergies: Allergies[1]  Medications: I have reviewed the patient's current medications.  Results for orders placed or performed during the hospital encounter of 09/04/24 (from the past 48 hours)  Troponin T, High Sensitivity     Status: Abnormal   Collection Time: 09/04/24 12:15 AM  Result Value Ref Range   Troponin T High Sensitivity 92 (H) 0 - 19 ng/L    Comment: (NOTE) Biotin concentrations > 1000 ng/mL falsely decrease TnT results.  Serial cardiac troponin measurements are suggested.  Refer to the Links section for chest pain algorithms and additional  guidance. Performed at [facility], [address] [place], [place] [address]   CBC with Differential     Status: Abnormal   Collection Time: 09/04/24  9:18 PM  Result Value Ref Range   WBC 8.7 4.0 - 10.5 K/uL   RBC 3.44 (L) 4.22 - 5.81 MIL/uL   Hemoglobin 8.8 (L) 13.0 - 17.0 g/dL   HCT 70.6 (L) 60.9 - 47.9 %   MCV 85.2 80.0 - 100.0 fL   MCH 25.6 (L) 26.0 - 34.0 pg   MCHC 30.0 30.0 - 36.0 g/dL   RDW 85.0 88.4 - 84.4 %   Platelets 361 150 - 400 K/uL   nRBC 0.0 0.0 - 0.2 %   Neutrophils Relative %  85 %   Neutro Abs 7.4 1.7 - 7.7 K/uL   Lymphocytes Relative 6 %   Lymphs Abs 0.5 (L) 0.7 - 4.0 K/uL   Monocytes Relative 8 %   Monocytes Absolute 0.7 0.1 - 1.0 K/uL   Eosinophils Relative 0 %   Eosinophils Absolute 0.0 0.0 - 0.5 K/uL   Basophils Relative 1 %   Basophils Absolute 0.0 0.0 - 0.1 K/uL   Immature Granulocytes 0 %   Abs Immature Granulocytes 0.03 0.00 - 0.07 K/uL    Comment: Performed at [facility], [address], [place], [place] [address]  Comprehensive metabolic panel     Status: Abnormal   Collection Time: 09/04/24  9:18 PM  Result Value Ref Range   Sodium 140 135 - 145 mmol/L   Potassium 3.4 (L) 3.5 - 5.1 mmol/L   Chloride 105 98 - 111 mmol/L   CO2 23 22 - 32 mmol/L   Glucose, Bld 133 (H) 70 - 99 mg/dL    Comment: Glucose reference range applies only to samples  taken after fasting for at least 8 hours.   BUN 23 8 - 23 mg/dL   Creatinine, Ser 8.83 0.61 - 1.24 mg/dL   Calcium  8.4 (L) 8.9 - 10.3 mg/dL   Total Protein 6.4 (L) 6.5 - 8.1 g/dL   Albumin  3.2 (L) 3.5 - 5.0 g/dL   AST 22 15 - 41 U/L   ALT 11 0 - 44 U/L   Alkaline Phosphatase 106 38 - 126 U/L   Total Bilirubin 0.8 0.0 - 1.2 mg/dL   GFR, Estimated >39 >39 mL/min    Comment: (NOTE) Calculated using the CKD-EPI Creatinine Equation (2021)    Anion gap 12 5 - 15    Comment: Performed at [facility], [address], [place], [place] [address]  Troponin T, High Sensitivity     Status: Abnormal   Collection Time: 09/04/24  9:18 PM  Result Value Ref Range   Troponin T High Sensitivity 99 (H) 0 - 19 ng/L    Comment: (NOTE) Biotin concentrations > 1000 ng/mL falsely decrease TnT results.  Serial cardiac troponin measurements are suggested.  Refer to the Links section for chest pain algorithms and additional  guidance. Performed at [facility], [address], [place], [place] [address]   I-Stat venous blood gas, [facility], [facility], [facility])     Status: Abnormal   Collection Time: 09/04/24  9:25 PM  Result Value Ref Range   pH, Ven 7.408 7.25 - 7.43   pCO2, Ven 37.8 (L) 44 - 60 mmHg   pO2, Ven 38 32 - 45 mmHg   Bicarbonate 23.8 20.0 - 28.0 mmol/L   TCO2 25 22 - 32 mmol/L   O2 Saturation 73 %   Acid-base deficit 1.0 0.0 - 2.0 mmol/L   Sodium 141 135 - 145 mmol/L   Potassium 3.4 (L) 3.5 - 5.1 mmol/L   Calcium , Ion 1.10 (L) 1.15 - 1.40 mmol/L   HCT 28.0 (L) 39.0 - 52.0 %   Hemoglobin 9.5 (L) 13.0 - 17.0 g/dL   Sample type VENOUS    Comment NOTIFIED PHYSICIAN   POC CBG, ED     Status: Abnormal   Collection Time: 09/04/24 10:30 PM  Result Value Ref Range   Glucose-Capillary 114 (H) 70 - 99 mg/dL    Comment: Glucose reference range applies only to samples taken after fasting for at least 8 hours.  CBC     Status:  Abnormal   Collection Time: 09/05/24 12:15 AM  Result Value Ref  Range   WBC 7.9 4.0 - 10.5 K/uL   RBC 3.08 (L) 4.22 - 5.81 MIL/uL   Hemoglobin 7.9 (L) 13.0 - 17.0 g/dL   HCT 72.9 (L) 60.9 - 47.9 %   MCV 87.7 80.0 - 100.0 fL   MCH 25.6 (L) 26.0 - 34.0 pg   MCHC 29.3 (L) 30.0 - 36.0 g/dL   RDW 85.0 88.4 - 84.4 %   Platelets 349 150 - 400 K/uL   nRBC 0.0 0.0 - 0.2 %    Comment: Performed at [facility], [address] [place], [place] [address]  Basic metabolic panel     Status: Abnormal   Collection Time: 09/05/24 12:15 AM  Result Value Ref Range   Sodium 135 135 - 145 mmol/L   Potassium 4.2 3.5 - 5.1 mmol/L   Chloride 102 98 - 111 mmol/L   CO2 16 (L) 22 - 32 mmol/L   Glucose, Bld 129 (H) 70 - 99 mg/dL    Comment: Glucose reference range applies only to samples taken after fasting for at least 8 hours.   BUN 8 8 - 23 mg/dL   Creatinine, Ser 9.24 0.61 - 1.24 mg/dL   Calcium  8.6 (L) 8.9 - 10.3 mg/dL   GFR, Estimated >39 >39 mL/min    Comment: (NOTE) Calculated using the CKD-EPI Creatinine Equation (2021)    Anion gap 17 (H) 5 - 15    Comment: Performed at [facility], [address] [place], [place] [address]  Type and screen [facility]     Status: None   Collection Time: 09/05/24  7:01 AM  Result Value Ref Range   ABO/RH(D) A POS    Antibody Screen NEG    Sample Expiration      09/08/2024,2359 Performed at [facility], [address], [place], [place] [address]     DG Knee Complete 4 Views Left Result Date: 09/04/2024 CLINICAL DATA:  Pain after fall.  Tender to palpation. EXAM: LEFT KNEE - COMPLETE 4+ VIEW COMPARISON:  Tibia/fibula radiograph earlier today FINDINGS: Lateral view is limited by difficulty with positioning. No evidence of acute fracture or dislocation. The bones are subjectively under mineralized. No large joint effusion. Soft tissue edema and vascular calcifications. IMPRESSION: 1. No acute fracture or dislocation of the left knee. 2. Soft tissue edema and vascular calcifications.  Electronically Signed   By: Andrea Gasman M.D.   On: 09/04/2024 22:46   DG Femur Min 2 Views Left Result Date: 09/04/2024 CLINICAL DATA:  Pain after fall.  Tender to palpation. EXAM: LEFT FEMUR 2 VIEWS COMPARISON:  Pelvis and hip radiograph earlier today FINDINGS: Femoral intramedullary nail with trans trochanteric and distal locking screw fixation. Remote intertrochanteric femur fracture. No acute fracture. No periprosthetic lucency. Vascular calcifications. Mild soft tissue edema. IMPRESSION: 1. No acute fracture of the left femur. 2. Remote intertrochanteric femur fracture post ORIF. Electronically Signed   By: Andrea Gasman M.D.   On: 09/04/2024 22:45   DG Foot Complete Left Result Date: 09/04/2024 EXAM: 3 OR MORE VIEW(S) XRAY OF THE LEFT FOOT 09/04/2024 10:33:19 PM COMPARISON: None available. CLINICAL HISTORY: FINDINGS: BONES AND JOINTS: There is questionable subtle cortical lucency through the medial base of the 5th distal phalanx. Nondisplaced fracture is not excluded. There is no dislocation. SOFT TISSUES: Vascular calcifications. IMPRESSION: 1. Questionable nondisplaced fracture of the medial base of the 5th distal phalanx.  Electronically signed by: Greig Pique MD 09/04/2024 10:44 PM EST RP Workstation: [id number]   DG Tibia/Fibula Left Result Date: 09/04/2024 EXAM: _VIEWS_ VIEW(S) XRAY OF THE LEFT TIBIA AND FIBULA 09/04/2024 10:33:19 PM COMPARISON: None available. CLINICAL HISTORY: FINDINGS: BONES AND JOINTS: Partially visualized intramedullary rod in distal femur. There is acute oblique fracture of the distal diaphysis of the tibia. Fracture fragments are distracted 6 mm. There is apex posterior angulation. There is comminuted fracture of the distal fibular diaphysis with mild apex posterior angulation. No dislocation. SOFT TISSUES: There is soft tissue swelling surrounding the fracture. Vascular calcifications. IMPRESSION: 1. Acute oblique fracture of the distal tibial diaphysis with 6 mm  distraction and apex posterior angulation. 2. Comminuted fracture of the distal fibular diaphysis with mild apex posterior angulation. Electronically signed by: Greig Pique MD 09/04/2024 10:42 PM EST RP Workstation: [id number]   CT Cervical Spine Wo Contrast Result Date: 09/04/2024 EXAM: CT CERVICAL SPINE WITHOUT CONTRAST 09/04/2024 09:50:00 PM TECHNIQUE: CT of the cervical spine was performed without the administration of intravenous contrast. Multiplanar reformatted images are provided for review. Automated exposure control, iterative reconstruction, and/or weight based adjustment of the mA/kV was utilized to reduce the radiation dose to as low as reasonably achievable. COMPARISON: None available. CLINICAL HISTORY: Neck trauma (Age >= 65y). Neck trauma; Age >= 65y. FINDINGS: BONES AND ALIGNMENT: No acute fracture or traumatic malalignment. DEGENERATIVE CHANGES: Moderate to advanced disc space height loss at C6-C7. Advanced facet arthropathy on the left throughout the cervical spine. No severe spinal canal narrowing. SOFT TISSUES: No prevertebral soft tissue swelling. IMPRESSION: 1. No evidence of acute traumatic injury. Electronically signed by: Norman Gatlin MD 09/04/2024 10:01 PM EST RP Workstation: [id number]   CT Head Wo Contrast Result Date: 09/04/2024 EXAM: CT HEAD WITHOUT CONTRAST 09/04/2024 09:50:00 PM TECHNIQUE: CT of the head was performed without the administration of intravenous contrast. Automated exposure control, iterative reconstruction, and/or weight based adjustment of the mA/kV was utilized to reduce the radiation dose to as low as reasonably achievable. COMPARISON: 07/17/2024 CLINICAL HISTORY: Head trauma, moderate-severe. FINDINGS: BRAIN AND VENTRICLES: No acute hemorrhage. No evidence of acute infarct. Stable parenchymal volume loss with prominent ventricular system and extra-axial CSF spaces. Stable patchy supratentorial white matter hypodensity consistent with chronic small-vessel  ischemic change. Chronic infarct in the posterior limb of the left internal capsule. No mass effect or midline shift. ORBITS: Bilateral lens implants in place. SINUSES: Mild mucosal thickening in paranasal sinuses. SOFT TISSUES AND SKULL: No acute soft tissue abnormality. No skull fracture. Bilateral carotid siphon calcification. IMPRESSION: 1. No acute intracranial abnormality. Electronically signed by: Norman Gatlin MD 09/04/2024 09:59 PM EST RP Workstation: [id number]   DG Ankle Complete Left Result Date: 09/04/2024 EXAM: 3 OR MORE VIEW(S) XRAY OF THE LEFT ANKLE 09/04/2024 08:34:00 PM CLINICAL HISTORY: Pain. COMPARISON: None available. FINDINGS: BONES AND JOINTS: Acute oblique fracture of the distal left tibial diaphysis with apex posterior angulation and 2 cm of medial displacement of the distal fragment. Additional oblique fracture of the distal left fibular diaphysis with apex medial / posterior angulation. No widening at the ankle mortise. SOFT TISSUES: Soft tissue swelling. IMPRESSION: 1. Acute oblique fractures of the distal left tibia and fibula. Electronically signed by: Norman Gatlin MD 09/04/2024 08:41 PM EST RP Workstation: [id number]   DG HIP PORT UNILAT WITH PELVIS 1V LEFT Result Date: 09/04/2024 EXAM: 1 VIEW(S) XRAY OF THE PELVIS AND BILATERAL HIP 09/04/2024 08:34:00 PM COMPARISON: None available. CLINICAL HISTORY: Pain. FINDINGS: BONES AND JOINTS: Old healed left intertrochanteric  fracture status post open reduction and internal fixation. No evidence of acute fracture. No dislocation. LUMBAR SPINE: Degenerative changes of the lower lumbar spine. SOFT TISSUES: Vascular calcifications. STOMACH AND BOWEL: Large stool ball in the rectum. IMPRESSION: 1. No acute fracture or dislocation. 2. Old healed left intertrochanteric fracture status post open reduction and internal fixation. 3. Large stool ball in the rectum. Electronically signed by: Norman Gatlin MD 09/04/2024 08:39 PM EST RP Workstation:  [id number]    Review of Systems  Unable to perform ROS: Dementia   Blood pressure (!) 135/98, pulse 95, temperature (!) 97.4 F (36.3 C), temperature source Oral, resp. rate (!) 21, SpO2 100%. Physical Exam Constitutional:      General: He is not in acute distress.    Appearance: He is well-developed. He is not diaphoretic.  HENT:     Head: Normocephalic and atraumatic.  Eyes:     General: No scleral icterus.       Right eye: No discharge.        Left eye: No discharge.     Conjunctiva/sclera: Conjunctivae normal.  Cardiovascular:     Rate and Rhythm: Normal rate and regular rhythm.  Pulmonary:     Effort: Pulmonary effort is normal. No respiratory distress.  Musculoskeletal:     Cervical back: Normal range of motion.     Comments: LLE No traumatic wounds, ecchymosis, or rash  Short leg splint in place  No knee effusion  Knee stable to varus/ valgus and anterior/posterior stress  Sens DPN, SPN, TN could not assess  Motor EHL, ext, flex, evers could not assess  Toes perfused, No significant edema  Skin:    General: Skin is warm and dry.  Neurological:     Mental Status: He is alert.  Psychiatric:        Mood and Affect: Mood normal.        Behavior: Behavior normal.     Assessment/Plan: Left tibia fx -- Plan IMN today with Dr. Kendal. Please keep NPO.    Ozell DOROTHA Ned, PA-C Orthopedic Surgery [phone] 09/05/2024, 10:31 AM     [1]  Allergies Allergen Reactions   Viagra [Sildenafil]     Other reaction(s): blue haze   Iodinated Contrast Media Nausea And Vomiting and Rash

## 2024-09-05 NOTE — H&P (Signed)
 " History and Physical    Larry Briggs FMW:[id number] DOB: 07-06-1936 DOA: 09/04/2024  Patient coming from: Skilled nursing facility.  Chief Complaint: Fall.  HPI: Larry Briggs is a 89 y.o. male with history of CAD, recent admission for stroke about a month ago, paroxysmal atrial fibrillation, COPD, anemia, hypertension was brought to the ER after patient had an unwitnessed fall when patient was trying to reach for remote control.  As per patient's wife who provided most of the history since his discharge last month for stroke when patient had right-sided hemiparesis patient has been largely bedbound.  He was on the bed and was trying to reach for a remote control when he lost balance and fell onto the floor.  Did not lose consciousness.  He hurt his left leg and was brought to the ER.  ED Course: In the ER x-rays revealed left tibia and fibula distal fracture.  Dr. Sherida on-call orthopedic surgeon was consulted.  Labs show hemoglobin of 8.8 creatinine 1.1 potassium 3.4 which replacement was given.  CT head and C-spine did not show anything acute.  EKG shows A-fib rate controlled.  Troponins were elevated at 19 but remained flat.  Patient denies any chest pain.  Review of Systems: As per HPI, rest all negative.   Past Medical History:  Diagnosis Date   CAD (coronary artery disease)    Remote MI in 1980. CABG 1995. Stent to left main in 2004. Last cath in April of 2012. EF 50%; patent LIMA to DX/LAD with collateralization of the distal right, patent SVG to OM, occluded SVG to distal RCA, and patent stent to LAD   History of heart attack 1980   Hypercholesteremia    Hypertension    Increased glucose level    Obesity     Past Surgical History:  Procedure Laterality Date   CARDIAC CATHETERIZATION  1993   CARDIAC CATHETERIZATION  April 2012   Mild reduction if EF at 50%. Patent LIMA to DX/LAD with collateralization to distal RCA, patent SVG to OM and occluded SVG to distal right and  patent stent to LAD/Left main;   CORONARY ARTERY BYPASS GRAFT  05/1994   x5 LIMA to LAD & DX, SVG to LCX, SVG to RCA   CORONARY STENT PLACEMENT  2004   Stent to the L main   INTRAMEDULLARY (IM) NAIL INTERTROCHANTERIC Left 09/05/2017   Procedure: INTRAMEDULLARY (IM) NAIL INTERTROCHANTRIC;  Surgeon: Beverley Evalene BIRCH, MD;  Location: [facility] OR;  Service: Orthopedics;  Laterality: Left;     reports that he quit smoking about 30 years ago. His smoking use included cigarettes. He started smoking about 71 years ago. He has a 30.7 pack-year smoking history. He has never used smokeless tobacco. He reports that he does not drink alcohol and does not use drugs.  Allergies[1]  Family History  Problem Relation Age of Onset   Heart attack Father    Hypertension Father    Stroke Mother     Prior to Admission medications  Medication Sig Start Date End Date Taking? Authorizing Provider  acetaminophen  (TYLENOL ) 500 MG tablet Take 500 mg by mouth at bedtime.    [provider]  amLODipine  (NORVASC ) 5 MG tablet TAKE 1 TABLET EVERY DAY 05/14/24   Jordan, Peter M, MD  azithromycin  (ZITHROMAX ) 250 MG tablet TAKE 1 TABLET 3 TIMES A WEEK ON MONDAY, WEDNESDAY, AND FRIDAY. 08/22/23   Hunsucker, Donnice SAUNDERS, MD  Bioflavonoid Products (ESTER-C) 500-550 MG TABS Take 500 mg by mouth daily.  [provider]  [Paused] chlorthalidone  (HYGROTON ) 25 MG tablet TAKE 1 TABLET EVERY DAY Wait to take this until your doctor or other care provider tells you to start again. 05/14/24   Jordan, Peter M, MD  cyanocobalamin  (VITAMIN B12) 1000 MCG tablet Take 1,000 mcg by mouth daily.    [provider]  docusate sodium  (COLACE) 100 MG capsule Take 100 mg by mouth as needed.    [provider]  feeding supplement (ENSURE PLUS HIGH PROTEIN) LIQD Take 237 mLs by mouth 2 (two) times daily between meals. 08/01/24   Caleen Colander, MD  Fluticasone -Umeclidin-Vilant (TRELEGY ELLIPTA ) 100-62.5-25 MCG/ACT AEPB Inhale  1 puff into the lungs daily. 06/27/24   Hunsucker, Donnice SAUNDERS, MD  guaiFENesin (MUCINEX) 600 MG 12 hr tablet Take 600 mg by mouth 2 (two) times daily as needed for to loosen phlegm.    [provider]  metoprolol  succinate (TOPROL -XL) 25 MG 24 hr tablet TAKE 1 TABLET EVERY DAY 10/03/23   Jordan, Peter M, MD  nitroGLYCERIN  (NITROSTAT ) 0.4 MG SL tablet Place 1 tablet (0.4 mg total) under the tongue as needed. 09/01/23   Jordan, Peter M, MD  Omega-3 Fatty Acids (FISH OIL) 500 MG CAPS Take 1 capsule by mouth daily at 6 (six) AM.    [provider]  Polyethyl Glycol-Propyl Glycol (SYSTANE ULTRA OP) Apply 1 drop to eye 2 (two) times daily.    [provider]  QUEtiapine  (SEROQUEL ) 25 MG tablet Take 0.5 tablets (12.5 mg total) by mouth at bedtime. Take at 6 pm 08/01/24   Caleen Colander, MD  Respiratory Therapy Supplies (FLUTTER) DEVI Use as directed 07/30/19   Darlean Ozell NOVAK, MD  Rivaroxaban  (XARELTO ) 15 MG TABS tablet Take 1 tablet (15 mg total) by mouth daily. 03/19/24   Jordan, Peter M, MD  silver  sulfADIAZINE  (SILVADENE ) 1 % cream Apply 1 Application topically daily as needed. 02/08/24   Hunsucker, Donnice SAUNDERS, MD  simvastatin  (ZOCOR ) 40 MG tablet TAKE 1 TABLET (40 MG TOTAL) BY MOUTH DAILY AT 6 PM. 12/19/23 07/18/24  Jordan, Peter M, MD  tamsulosin  (FLOMAX ) 0.4 MG CAPS capsule Take 0.4 mg by mouth daily after supper.    [provider]  VENTOLIN  HFA 108 (90 Base) MCG/ACT inhaler INHALE 2 PUFFS BY MOUTH EVERY 4 HOURS AS NEEDED FOR WHEEZING OR SHORTNESS OF BREATH 05/17/24   Hunsucker, Donnice SAUNDERS, MD    Physical Exam: Constitutional: Moderately built and nourished. Vitals:   09/04/24 1855 09/04/24 2234 09/04/24 2330  BP: (!) 111/59 (!) 148/129 (!) 139/97  Pulse: (!) 51 71 93  Resp: 20 (!) 30 (!) 24  Temp: 98.8 F (37.1 C)    TempSrc: Oral    SpO2: 100% 100% 99%   Eyes: Anicteric no pallor. ENMT: No discharge from the ears eyes nose and mouth. Neck: No mass felt.  No neck  rigidity. Respiratory: No rhonchi or crepitations. Cardiovascular: S1-S2 heard. Abdomen: Soft nontender bowel sound present. Musculoskeletal: Left leg in splint. Skin: Chronic skin changes. Neurologic: Alert awake oriented to time place and person.  Moving all extremities right-sided weakness from recent stroke. Psychiatric: Appears normal.  Normal affect.   Labs on Admission: I have personally reviewed following labs and imaging studies  CBC: Recent Labs  Lab 09/04/24 2118 09/04/24 2125  WBC 8.7  --   NEUTROABS 7.4  --   HGB 8.8* 9.5*  HCT 29.3* 28.0*  MCV 85.2  --   PLT 361  --    Basic Metabolic Panel:  Recent Labs  Lab 09/04/24 2118 09/04/24 2125  NA 140 141  K 3.4* 3.4*  CL 105  --   CO2 23  --   GLUCOSE 133*  --   BUN 23  --   CREATININE 1.16  --   CALCIUM  8.4*  --    GFR: CrCl cannot be calculated (Unknown ideal weight.). Liver Function Tests: Recent Labs  Lab 09/04/24 2118  AST 22  ALT 11  ALKPHOS 106  BILITOT 0.8  PROT 6.4*  ALBUMIN  3.2*   No results for input(s): LIPASE, AMYLASE in the last 168 hours. No results for input(s): AMMONIA in the last 168 hours. Coagulation Profile: No results for input(s): INR, PROTIME in the last 168 hours. Cardiac Enzymes: No results for input(s): CKTOTAL, CKMB, CKMBINDEX, TROPONINI in the last 168 hours. BNP (last 3 results) No results for input(s): PROBNP in the last 8760 hours. HbA1C: No results for input(s): HGBA1C in the last 72 hours. CBG: Recent Labs  Lab 09/04/24 2230  GLUCAP 114*   Lipid Profile: No results for input(s): CHOL, HDL, LDLCALC, TRIG, CHOLHDL, LDLDIRECT in the last 72 hours. Thyroid  Function Tests: No results for input(s): TSH, T4TOTAL, FREET4, T3FREE, THYROIDAB in the last 72 hours. Anemia Panel: No results for input(s): VITAMINB12, FOLATE, FERRITIN, TIBC, IRON , RETICCTPCT in the last 72 hours. Urine analysis:    Component  Value Date/Time   COLORURINE YELLOW 07/17/2024 2145   APPEARANCEUR CLEAR 07/17/2024 2145   LABSPEC 1.025 07/17/2024 2145   PHURINE 5.5 07/17/2024 2145   GLUCOSEU NEGATIVE 07/17/2024 2145   HGBUR TRACE (A) 07/17/2024 2145   BILIRUBINUR NEGATIVE 07/17/2024 2145   KETONESUR NEGATIVE 07/17/2024 2145   PROTEINUR NEGATIVE 07/17/2024 2145   NITRITE NEGATIVE 07/17/2024 2145   LEUKOCYTESUR TRACE (A) 07/17/2024 2145   Sepsis Labs: @LABRCNTIP (procalcitonin:4,lacticidven:4) )No results found for this or any previous visit (from the past 240 hours).   Radiological Exams on Admission: DG Knee Complete 4 Views Left Result Date: 09/04/2024 CLINICAL DATA:  Pain after fall.  Tender to palpation. EXAM: LEFT KNEE - COMPLETE 4+ VIEW COMPARISON:  Tibia/fibula radiograph earlier today FINDINGS: Lateral view is limited by difficulty with positioning. No evidence of acute fracture or dislocation. The bones are subjectively under mineralized. No large joint effusion. Soft tissue edema and vascular calcifications. IMPRESSION: 1. No acute fracture or dislocation of the left knee. 2. Soft tissue edema and vascular calcifications. Electronically Signed   By: Andrea Gasman M.D.   On: 09/04/2024 22:46   DG Femur Min 2 Views Left Result Date: 09/04/2024 CLINICAL DATA:  Pain after fall.  Tender to palpation. EXAM: LEFT FEMUR 2 VIEWS COMPARISON:  Pelvis and hip radiograph earlier today FINDINGS: Femoral intramedullary nail with trans trochanteric and distal locking screw fixation. Remote intertrochanteric femur fracture. No acute fracture. No periprosthetic lucency. Vascular calcifications. Mild soft tissue edema. IMPRESSION: 1. No acute fracture of the left femur. 2. Remote intertrochanteric femur fracture post ORIF. Electronically Signed   By: Andrea Gasman M.D.   On: 09/04/2024 22:45   DG Foot Complete Left Result Date: 09/04/2024 EXAM: 3 OR MORE VIEW(S) XRAY OF THE LEFT FOOT 09/04/2024 10:33:19 PM COMPARISON: None  available. CLINICAL HISTORY: FINDINGS: BONES AND JOINTS: There is questionable subtle cortical lucency through the medial base of the 5th distal phalanx. Nondisplaced fracture is not excluded. There is no dislocation. SOFT TISSUES: Vascular calcifications. IMPRESSION: 1. Questionable nondisplaced fracture of the medial base of the 5th distal phalanx. Electronically signed by: Greig Pique MD 09/04/2024 10:44  PM EST RP Workstation: [id number]   DG Tibia/Fibula Left Result Date: 09/04/2024 EXAM: _VIEWS_ VIEW(S) XRAY OF THE LEFT TIBIA AND FIBULA 09/04/2024 10:33:19 PM COMPARISON: None available. CLINICAL HISTORY: FINDINGS: BONES AND JOINTS: Partially visualized intramedullary rod in distal femur. There is acute oblique fracture of the distal diaphysis of the tibia. Fracture fragments are distracted 6 mm. There is apex posterior angulation. There is comminuted fracture of the distal fibular diaphysis with mild apex posterior angulation. No dislocation. SOFT TISSUES: There is soft tissue swelling surrounding the fracture. Vascular calcifications. IMPRESSION: 1. Acute oblique fracture of the distal tibial diaphysis with 6 mm distraction and apex posterior angulation. 2. Comminuted fracture of the distal fibular diaphysis with mild apex posterior angulation. Electronically signed by: Greig Pique MD 09/04/2024 10:42 PM EST RP Workstation: [id number]   CT Cervical Spine Wo Contrast Result Date: 09/04/2024 EXAM: CT CERVICAL SPINE WITHOUT CONTRAST 09/04/2024 09:50:00 PM TECHNIQUE: CT of the cervical spine was performed without the administration of intravenous contrast. Multiplanar reformatted images are provided for review. Automated exposure control, iterative reconstruction, and/or weight based adjustment of the mA/kV was utilized to reduce the radiation dose to as low as reasonably achievable. COMPARISON: None available. CLINICAL HISTORY: Neck trauma (Age >= 65y). Neck trauma; Age >= 65y. FINDINGS: BONES AND  ALIGNMENT: No acute fracture or traumatic malalignment. DEGENERATIVE CHANGES: Moderate to advanced disc space height loss at C6-C7. Advanced facet arthropathy on the left throughout the cervical spine. No severe spinal canal narrowing. SOFT TISSUES: No prevertebral soft tissue swelling. IMPRESSION: 1. No evidence of acute traumatic injury. Electronically signed by: Norman Gatlin MD 09/04/2024 10:01 PM EST RP Workstation: [id number]   CT Head Wo Contrast Result Date: 09/04/2024 EXAM: CT HEAD WITHOUT CONTRAST 09/04/2024 09:50:00 PM TECHNIQUE: CT of the head was performed without the administration of intravenous contrast. Automated exposure control, iterative reconstruction, and/or weight based adjustment of the mA/kV was utilized to reduce the radiation dose to as low as reasonably achievable. COMPARISON: 07/17/2024 CLINICAL HISTORY: Head trauma, moderate-severe. FINDINGS: BRAIN AND VENTRICLES: No acute hemorrhage. No evidence of acute infarct. Stable parenchymal volume loss with prominent ventricular system and extra-axial CSF spaces. Stable patchy supratentorial white matter hypodensity consistent with chronic small-vessel ischemic change. Chronic infarct in the posterior limb of the left internal capsule. No mass effect or midline shift. ORBITS: Bilateral lens implants in place. SINUSES: Mild mucosal thickening in paranasal sinuses. SOFT TISSUES AND SKULL: No acute soft tissue abnormality. No skull fracture. Bilateral carotid siphon calcification. IMPRESSION: 1. No acute intracranial abnormality. Electronically signed by: Norman Gatlin MD 09/04/2024 09:59 PM EST RP Workstation: [id number]   DG Ankle Complete Left Result Date: 09/04/2024 EXAM: 3 OR MORE VIEW(S) XRAY OF THE LEFT ANKLE 09/04/2024 08:34:00 PM CLINICAL HISTORY: Pain. COMPARISON: None available. FINDINGS: BONES AND JOINTS: Acute oblique fracture of the distal left tibial diaphysis with apex posterior angulation and 2 cm of medial displacement of  the distal fragment. Additional oblique fracture of the distal left fibular diaphysis with apex medial / posterior angulation. No widening at the ankle mortise. SOFT TISSUES: Soft tissue swelling. IMPRESSION: 1. Acute oblique fractures of the distal left tibia and fibula. Electronically signed by: Norman Gatlin MD 09/04/2024 08:41 PM EST RP Workstation: [id number]   DG HIP PORT UNILAT WITH PELVIS 1V LEFT Result Date: 09/04/2024 EXAM: 1 VIEW(S) XRAY OF THE PELVIS AND BILATERAL HIP 09/04/2024 08:34:00 PM COMPARISON: None available. CLINICAL HISTORY: Pain. FINDINGS: BONES AND JOINTS: Old healed left intertrochanteric fracture status post open reduction and internal fixation.  No evidence of acute fracture. No dislocation. LUMBAR SPINE: Degenerative changes of the lower lumbar spine. SOFT TISSUES: Vascular calcifications. STOMACH AND BOWEL: Large stool ball in the rectum. IMPRESSION: 1. No acute fracture or dislocation. 2. Old healed left intertrochanteric fracture status post open reduction and internal fixation. 3. Large stool ball in the rectum. Electronically signed by: Norman Gatlin MD 09/04/2024 08:39 PM EST RP Workstation: [id number]    EKG: Independently reviewed.  A-fib rate controlled.  Assessment/Plan Principal Problem:   Tibial fracture Active Problems:   Hypertension   Hypercholesteremia   COPD GOLD 4    CVA (cerebral vascular accident) (HCC)   Anemia   PAF (paroxysmal atrial fibrillation) (HCC)   Elevated troponin    Left-sided distal tibia and fibula fracture after mechanical fall. -I discussed with Dr. Sherida on-call orthopedic surgeon who is planning to do surgery in the morning.  Patient's last dose of Xarelto  was yesterday and has been more than 24 hours.  Will restart anticoagulation as soon as possible after surgery once okay with orthopedics.  Will keep patient n.p.o. except medication after midnight. CAD denies any chest pain.  Troponins elevated but flat.  Will continue with  statins beta-blockers. Recent stroke with right-sided hemiparesis restart anticoagulation after surgery once okay with orthopedics.  On statins. COPD was recently treated for pneumonia last week as per the patient's wife.  Presently not in distress.  Takes Trelegy and nebulizer as needed.  On chronic antibiotic therapy. Hypertension on amlodipine  and metoprolol . Anemia appears to be chronic will closely monitor. BPH on tamsulosin .  Since patient has tibial fracture requiring surgery will need further management and more than 2 midnight stay.  DVT prophylaxis: SCDs. Code Status: Full code.  DNR confirmed with patient's wife. Family Communication: Patient's wife. Disposition Plan: Monitored bed. Consults called: Orthopedics. Admission status: Inpatient.         [1]  Allergies Allergen Reactions   Viagra [Sildenafil]     Other reaction(s): blue haze   Iodinated Contrast Media Nausea And Vomiting and Rash   "

## 2024-09-06 ENCOUNTER — Encounter (HOSPITAL_COMMUNITY): Payer: Self-pay | Admitting: Student

## 2024-09-06 LAB — COMPREHENSIVE METABOLIC PANEL WITH GFR
ALT: 9 U/L (ref 0–44)
AST: 18 U/L (ref 15–41)
Albumin: 2.9 g/dL — ABNORMAL LOW (ref 3.5–5.0)
Alkaline Phosphatase: 75 U/L (ref 38–126)
Anion gap: 10 (ref 5–15)
BUN: 28 mg/dL — ABNORMAL HIGH (ref 8–23)
CO2: 24 mmol/L (ref 22–32)
Calcium: 7.8 mg/dL — ABNORMAL LOW (ref 8.9–10.3)
Chloride: 107 mmol/L (ref 98–111)
Creatinine, Ser: 1.16 mg/dL (ref 0.61–1.24)
GFR, Estimated: >60 mL/min
Glucose, Bld: 157 mg/dL — ABNORMAL HIGH (ref 70–99)
Potassium: 3.7 mmol/L (ref 3.5–5.1)
Sodium: 140 mmol/L (ref 135–145)
Total Bilirubin: 0.6 mg/dL (ref 0.0–1.2)
Total Protein: 5.4 g/dL — ABNORMAL LOW (ref 6.5–8.1)

## 2024-09-06 LAB — TYPE AND SCREEN
ABO/RH(D): A POS
Antibody Screen: NEGATIVE
Unit division: 0
Unit division: 0

## 2024-09-06 LAB — BPAM RBC
Blood Product Expiration Date: 202602272359
Blood Product Expiration Date: 202602282359
ISSUE DATE / TIME: 202602041223
ISSUE DATE / TIME: 202602041223
Unit Type and Rh: 6200
Unit Type and Rh: 6200

## 2024-09-06 LAB — CBC
HCT: 24.1 % — ABNORMAL LOW (ref 39.0–52.0)
Hemoglobin: 7.5 g/dL — ABNORMAL LOW (ref 13.0–17.0)
MCH: 26 pg (ref 26.0–34.0)
MCHC: 31.1 g/dL (ref 30.0–36.0)
MCV: 83.7 fL (ref 80.0–100.0)
Platelets: 265 10*3/uL (ref 150–400)
RBC: 2.88 MIL/uL — ABNORMAL LOW (ref 4.22–5.81)
RDW: 14.9 % (ref 11.5–15.5)
WBC: 6.1 10*3/uL (ref 4.0–10.5)
nRBC: 0 % (ref 0.0–0.2)

## 2024-09-06 MED ORDER — SENNA 8.6 MG PO TABS
1.0000 | ORAL_TABLET | Freq: Every day | ORAL | Status: AC
  Administered 2024-09-07: 8.6 mg via ORAL
  Filled 2024-09-06 (×2): qty 1

## 2024-09-06 MED ORDER — VITAMIN D (ERGOCALCIFEROL) 1.25 MG (50000 UNIT) PO CAPS
50000.0000 [IU] | ORAL_CAPSULE | ORAL | Status: AC
  Administered 2024-09-06: 50000 [IU] via ORAL
  Filled 2024-09-06: qty 1

## 2024-09-06 MED ORDER — THIAMINE MONONITRATE 100 MG PO TABS
100.0000 mg | ORAL_TABLET | Freq: Every day | ORAL | Status: AC
  Administered 2024-09-06 – 2024-09-07 (×2): 100 mg via ORAL
  Filled 2024-09-06 (×2): qty 1

## 2024-09-06 MED ORDER — VITAMIN D 25 MCG (1000 UNIT) PO TABS
1000.0000 [IU] | ORAL_TABLET | Freq: Every day | ORAL | Status: DC
  Administered 2024-09-06: 1000 [IU] via ORAL
  Filled 2024-09-06: qty 1

## 2024-09-06 MED ORDER — ADULT MULTIVITAMIN W/MINERALS CH
1.0000 | ORAL_TABLET | Freq: Every day | ORAL | Status: AC
  Administered 2024-09-06 – 2024-09-07 (×2): 1 via ORAL
  Filled 2024-09-06 (×2): qty 1

## 2024-09-06 MED ORDER — BOOST / RESOURCE BREEZE PO LIQD CUSTOM
1.0000 | Freq: Three times a day (TID) | ORAL | Status: AC
  Administered 2024-09-06 – 2024-09-07 (×4): 1 via ORAL

## 2024-09-06 NOTE — Evaluation (Signed)
 Physical Therapy Evaluation Patient Details Name: Larry Briggs MRN: [id number] DOB: 13-Mar-1936 Today's Date: 09/06/2024  History of Present Illness  Pt is a 89 y.o. M who presents 09/04/2024 after a fall with left distal tibial shaft and distal fibular shaft fracture now s/p intramedullary nailing of tibia. Nonoperative management of left fibula fracture. Significant PMH: CAD, stroke, paroxysmal atrial fibrillation, COPD, anemia, HTN.  Clinical Impression  PTA, pt receiving PT/OT services at SNF. He had made good progress there since his dx of CVA, now performing transfers to wheelchair with +1 assist and self feeding. Pt presents with decreased functional mobility secondary to LLE pain, LLE and residual R sided weakness from prior stroke, impaired balance, and cognition (baseline). Pt requiring modA + 2 for bed mobility. Utilized Stedy to transfer over from bed to chair. Pt with wheezing sitting up in chair; HR 89 bpm, SpO2 92% on RA. RN notified for inhaler. Pt spouse at bedside and supportive. Patient will benefit from continued inpatient follow up therapy, <3 hours/day to address deficits, maximize functional mobility and decrease caregiver burden.       If plan is discharge home, recommend the following: Two people to help with walking and/or transfers;Two people to help with bathing/dressing/bathroom   Can travel by private vehicle   No    Equipment Recommendations None recommended by PT  Recommendations for Other Services       Functional Status Assessment Patient has had a recent decline in their functional status and demonstrates the ability to make significant improvements in function in a reasonable and predictable amount of time.     Precautions / Restrictions Precautions Precautions: Fall Precaution/Restrictions Comments: R hemi, incontinence (wears briefs) Required Braces or Orthoses: Other Brace Other Brace: L CAM boot Restrictions Weight Bearing Restrictions Per  Provider Order: No      Mobility  Bed Mobility Overal bed mobility: Needs Assistance Bed Mobility: Supine to Sit     Supine to sit: Mod assist, +2 for physical assistance     General bed mobility comments: Assist for initiating movement, pt with good effort, use of bed pad to pivot hips to edge of bed and elevate trunk    Transfers Overall transfer level: Needs assistance Equipment used: Ambulation equipment used Transfers: Sit to/from Stand Sit to Stand: Mod assist, +2 physical assistance, Max assist           General transfer comment: Pt with difficulty on 1st attempt, but from elevated bed height and use of bed pad, able to achieve full upright standing position x 2. Sustained static standing 30-60s to allow for removal of brief and placement of new one    Ambulation/Gait                  Stairs            Wheelchair Mobility     Tilt Bed    Modified Rankin (Stroke Patients Only)       Balance Overall balance assessment: Needs assistance Sitting-balance support: Feet supported Sitting balance-Leahy Scale: Fair                                       Pertinent Vitals/Pain Pain Assessment Pain Assessment: Faces Faces Pain Scale: Hurts even more Pain Location: LLE with weightbearing Pain Descriptors / Indicators: Operative site guarding, Grimacing Pain Intervention(s): Monitored during session, Premedicated before session    Home Living Family/patient  expects to be discharged to:: Skilled nursing facility                        Prior Function Prior Level of Function : Needs assist             Mobility Comments: +1 squat vs stand pivot transfer using w/c, has taken 1-2 steps in parallel bars ADLs Comments: Has been able to feed self, assist for ADL's at [facility]     Extremity/Trunk Assessment   Upper Extremity Assessment Upper Extremity Assessment: Defer to OT evaluation    Lower Extremity Assessment Lower  Extremity Assessment: RLE deficits/detail;LLE deficits/detail RLE Deficits / Details: Hx of residual weakness from prior stroke. Pt able to perform limited heel slide, ankle DF at least 3/5 LLE Deficits / Details: CAM boot donned. Grossly weak    Cervical / Trunk Assessment Cervical / Trunk Assessment: Kyphotic  Communication   Communication Communication: Impaired Factors Affecting Communication: Hearing impaired    Cognition Arousal: Alert Behavior During Therapy: WFL for tasks assessed/performed   PT - Cognitive impairments: History of cognitive impairments                       PT - Cognition Comments: Pt able to follow 1 step commands with increased time, states whatever, to most questions, able to state basic needs Following commands: Impaired Following commands impaired: Follows one step commands with increased time     Cueing Cueing Techniques: Verbal cues, Gestural cues, Tactile cues, Visual cues     General Comments      Exercises     Assessment/Plan    PT Assessment Patient needs continued PT services  PT Problem List Decreased strength;Decreased activity tolerance;Decreased mobility;Decreased balance;Decreased cognition;Pain       PT Treatment Interventions DME instruction;Functional mobility training;Therapeutic activities;Therapeutic exercise;Balance training;Patient/family education    PT Goals (Current goals can be found in the Care Plan section)  Acute Rehab PT Goals Patient Stated Goal: pt spouse agreeable for him to return to rehab PT Goal Formulation: With patient/family Time For Goal Achievement: 09/20/24 Potential to Achieve Goals: Fair    Frequency Min 2X/week     Co-evaluation               AM-PAC PT 6 Clicks Mobility  Outcome Measure Help needed turning from your back to your side while in a flat bed without using bedrails?: A Lot Help needed moving from lying on your back to sitting on the side of a flat bed without  using bedrails?: Total Help needed moving to and from a bed to a chair (including a wheelchair)?: Total Help needed standing up from a chair using your arms (e.g., wheelchair or bedside chair)?: Total Help needed to walk in hospital room?: Total Help needed climbing 3-5 steps with a railing? : Total 6 Click Score: 7    End of Session Equipment Utilized During Treatment: Gait belt;Other (comment) (CAM boot) Activity Tolerance: Patient tolerated treatment well Patient left: in chair;with call bell/phone within reach;with chair alarm set;with family/visitor present Nurse Communication: Mobility status;Other (comment) (pt spouse requesting pt receive inhaler) PT Visit Diagnosis: History of falling (Z91.81);Difficulty in walking, not elsewhere classified (R26.2);Pain Pain - Right/Left: Left Pain - part of body: Leg    Time: 0851-0920 PT Time Calculation (min) (ACUTE ONLY): 29 min   Charges:   PT Evaluation $PT Eval Low Complexity: 1 Low   PT General Charges $$ ACUTE PT VISIT: 1 Visit  Aleck Daring, PT, DPT Acute Rehabilitation Services Office [phone]   Aleck ONEIDA Daring 09/06/2024, 10:09 AM

## 2024-09-06 NOTE — Plan of Care (Signed)
  Problem: Coping: Goal: Level of anxiety will decrease Outcome: Progressing   Problem: Elimination: Goal: Will not experience complications related to bowel motility Outcome: Progressing   Problem: Pain Managment: Goal: General experience of comfort will improve and/or be controlled Outcome: Progressing   Problem: Safety: Goal: Ability to remain free from injury will improve Outcome: Progressing

## 2024-09-06 NOTE — Hospital Course (Signed)
 89 y.o. male with history of CAD, recent admission for stroke about a month ago, paroxysmal atrial fibrillation, COPD, anemia, hypertension was brought to the ER after patient had an unwitnessed fall when patient was trying to reach for remote control.  As per patient's wife who provided most of the history since his discharge last month for stroke when patient had right-sided hemiparesis patient has been largely bedbound.  He was on the bed and was trying to reach for a remote control when he lost balance and fell onto the floor.  Did not lose consciousness.  He hurt his left leg and was brought to the ER.   ED Course: In the ER x-rays revealed left tibia and fibula distal fracture.  Dr. Sherida on-call orthopedic surgeon was consulted.  Labs show hemoglobin of 8.8 creatinine 1.1 potassium 3.4 which replacement was given.  CT head and C-spine did not show anything acute.  EKG shows A-fib rate controlled

## 2024-09-06 NOTE — Progress Notes (Signed)
" °  Inpatient Rehabilitation Admissions Coordinator   Asked to assess per family request for Cir admit. Reviewed last admission, as well as therapy assessments today. Patient not at a level to pursue the intensity required of a CIR admit. Recommend return to SNF.  Heron Leavell, RN, MSN Rehab Admissions Coordinator [phone] 09/06/2024 1:11 PM  "

## 2024-09-06 NOTE — Progress Notes (Signed)
 Initial Nutrition Assessment  DOCUMENTATION CODES:   Severe malnutrition in context of chronic illness  INTERVENTION:  Encourage po intake Currently on regular diet with thin liquids Nursing to assist with meals if wife is not present  Discussed preordering meals with wife at bedside  Boost Breeze po TID, each supplement provides 250 kcal and 9 grams of protein 50,000 units Vitamin D  once weekly x 8 weeks for deficiency, then 2,000 units Vitamin  D thereafter for maintenance  MVI with minerals daily 100 mg Thiamine  daily x 7 days  NUTRITION DIAGNOSIS:   Severe Malnutrition related to   as evidenced by severe muscle depletion, severe fat depletion, energy intake < or equal to 75% for > or equal to 1 month.  GOAL:   Patient will meet greater than or equal to 90% of their needs  MONITOR:   PO intake, Supplement acceptance, Skin, Labs  REASON FOR ASSESSMENT:   Consult Hip fracture protocol  ASSESSMENT:  89 y.o. male with history of CAD, recent admission for stroke about a month ago, has mostly been bedbound since, residual right hemiparesis, dysphagia, delirium, afib, COPD, anemia, HTN. Presented after a mechanical fall. Imaging revealed left sided distal tibia and fibula fracture.    2/3 - Imaging showed left tibia/fibular fracture  2/4 - s/p Intramedullary nailing of left tibia fracture, nonoperative management of left fibula fracture  Pt up in chair on visit with wife at bedside. Pt lethargic, most history provided by pt's wife.   Pt admitted 07/18/2024-08/01/2024 for stroke. Hospitalization complicated by dysphagia from stroke, requiring cortrak placement and tube feeding and hospital delirium.  SLP eval done on 12/26 recommending DYS 1 diet with nectar thick fluids. Oral intake improved and tube feeds stopped. Discharged to [facility] on Dysphagia 1 diet with thin liquids.   Per pt's wife, he was able to work back to eating a regular diet with thin liquids at SNF however,  his appetite has not been the same since the stroke. Pt's wife attributes this to pt being a picky eater and not liking the food offered by SNF. Even with outside food being offered by pt's wife, pt will typically only consume 50%. Does not eat a lot of protein foods but does like sweet things. Breakfast is typically 1 egg with grits, lunch would be 50% of a sandwich and dinner would be 25-50% of meals provided at [facility]. Was snacking on peanut butter crackers and eating [employer], but recently had to stop these as they were causing pt diarrhea.   Pt has been largely bedbound since stroke with right sided hemiparesis. Has been able to slowly work up to being able to feed himself but wife reports it is still difficult. No weight documented on admission. Per pt's wife, pt's intake has increased a lot more since being discharged but is till not quite the same as prior to stroke. Pt does present with muscle and fat loss even in context of being bedbound. Meets criteria for severe malnutrition.   Discussed increased calorie and protein needs related to post op healing with pt and wife. Encouraged small frequent meals. Per wife, pt does not like Ensures or Magic cups, willing to try Boost breeze. Vitamin D  low, supplement.   Admit weight: No weight documented on admission.   Average Meal Intake: 2/5: 100% intake x 1 recorded meals  Nutritionally Relevant Medications: Scheduled Meds:  cyanocobalamin   1,000 mcg Oral Daily   feeding supplement  1 Container Oral TID BM   multivitamin  with minerals  1 tablet Oral Daily   omega-3 acid ethyl esters  1 g Oral Daily   thiamine   100 mg Oral Daily   Vitamin D  (Ergocalciferol )  50,000 Units Oral Q7 days   Continuous Infusions:  sodium chloride      Labs Reviewed: Vitamin D  14.8 (L) CBG ranges from 129-157 mg/dL over the last 24 hours HgbA1c 5.4  NUTRITION - FOCUSED PHYSICAL EXAM:  Flowsheet Row Most Recent Value  Orbital Region Severe depletion  Upper  Arm Region Moderate depletion  Thoracic and Lumbar Region Severe depletion  Buccal Region Moderate depletion  Temple Region Moderate depletion  Clavicle Bone Region Severe depletion  Clavicle and Acromion Bone Region Severe depletion  Scapular Bone Region Severe depletion  Dorsal Hand Severe depletion  Patellar Region Unable to assess  [Swelling]  Anterior Thigh Region Unable to assess  [Swelling]  Posterior Calf Region Unable to assess  [Swelling]  Edema (RD Assessment) Mild  [lower extremites]  Hair Reviewed  Eyes Reviewed  Mouth Reviewed  [Wears dentures]  Skin Reviewed  [Bruising]  Nails Reviewed    Diet Order:   Diet Order             Diet regular Room service appropriate? Yes; Fluid consistency: Thin  Diet effective now                   EDUCATION NEEDS:   Education needs have been addressed  Skin:  Skin Assessment: Reviewed RN Assessment  Last BM:  2/4  Height:   Ht Readings from Last 1 Encounters:  09/05/24 (P) 5' 10 (1.778 m)    Weight:   Wt Readings from Last 1 Encounters:  07/31/24 75.1 kg    Ideal Body Weight:  67.9 kg (Accounts for being bedbound)  BMI:  Body mass index is 23.76 kg/m (pended).  Estimated Nutritional Needs:   Kcal:  1600-1800 kcal  Protein:  80-100 gm  Fluid:  >1.6L/day   Olivia Kenning, RD Registered Dietitian  See Amion for more information

## 2024-09-06 NOTE — NC FL2 (Signed)
 " [place]  MEDICAID FL2 LEVEL OF CARE FORM     IDENTIFICATION  Patient Name: Larry Briggs Birthdate: 28-Feb-1936 Sex: male Admission Date (Current Location): 09/04/2024  [facility] and [place] Number:  Producer, Television/film/video and Address:  [facility] [place], [address] [place], [place] [address]      Provider Number: [id number]  Attending Physician Name and Address:  Cindy Garnette POUR, MD  Relative Name and Phone Number:  Tremain, Rucinski [phone]  [phone]    Current Level of Care: Hospital Recommended Level of Care: Skilled Nursing Facility Prior Approval Number:    Date Approved/Denied:   PASRR Number: [id number]  Discharge Plan: SNF    Current Diagnoses: Patient Active Problem List   Diagnosis Date Noted   Anemia 09/05/2024   PAF (paroxysmal atrial fibrillation) (HCC) 09/05/2024   Elevated troponin 09/05/2024   Tibial fracture 09/04/2024   Protein-calorie malnutrition, severe 07/21/2024   Right sided weakness 07/19/2024   CVA (cerebral vascular accident) (HCC) 12/05/2022   Pseudomonas aeruginosa infection 11/20/2019   Abrasion 07/30/2019   Medication management 07/03/2019   Abnormal findings on diagnostic imaging of lung 03/02/2019   New onset atrial fibrillation (HCC)    Hypokalemia    Acute blood loss anemia    Hip fracture (HCC) 09/04/2017   Cough 04/22/2016   COPD GOLD 4  04/22/2016   T wave inversion in EKG 05/10/2011   Hypertension    Arteriosclerotic cardiovascular disease (ASCVD)    Hypercholesteremia    Obesity    Ischemic heart disease    Increased glucose level     Orientation RESPIRATION BLADDER Height & Weight     Self  O2 Incontinent Weight:   Height:  (P) 5' 10 (177.8 cm)  BEHAVIORAL SYMPTOMS/MOOD NEUROLOGICAL BOWEL NUTRITION STATUS      Incontinent Diet (see discharge summary)  AMBULATORY STATUS COMMUNICATION OF NEEDS Skin   Total Care Verbally Skin abrasions, Surgical wounds                        Personal Care Assistance Level of Assistance  Bathing, Feeding, Dressing, Total care Bathing Assistance: Maximum assistance Feeding assistance: Limited assistance Dressing Assistance: Maximum assistance Total Care Assistance: Maximum assistance   Functional Limitations Info  Sight, Hearing, Speech Sight Info: Adequate Hearing Info: Adequate Speech Info: Adequate    SPECIAL CARE FACTORS FREQUENCY  PT (By licensed PT), OT (By licensed OT)     PT Frequency: 5x week OT Frequency: 5x week            Contractures Contractures Info: Not present    Additional Factors Info  Code Status, Allergies Code Status Info: DNR Allergies Info: Viagra (Sildenafil)  Iodinated Contrast Media           Current Medications (09/06/2024):  This is the current hospital active medication list Current Facility-Administered Medications  Medication Dose Route Frequency Provider Last Rate Last Admin   0.9 %  sodium chloride  infusion  10 mL/hr Intravenous Once McMillen, Michael L, CRNA       acetaminophen  (TYLENOL ) tablet 650 mg  650 mg Oral Q6H Danton Domino A, PA-C   650 mg at 09/06/24 1126   albuterol  (PROVENTIL ) (2.5 MG/3ML) 0.083% nebulizer solution 2.5 mg  2.5 mg Nebulization Q2H PRN Kakrakandy, Arshad N, MD   2.5 mg at 09/06/24 9075   amLODipine  (NORVASC ) tablet 5 mg  5 mg Oral Daily Kakrakandy, Arshad N, MD   5 mg at 09/06/24 [phone]  budesonide -glycopyrrolate -formoterol  (BREZTRI ) 160-9-4.8 MCG/ACT inhaler 2 puff  2 puff Inhalation BID Franky Redia SAILOR, MD   2 puff at 09/06/24 9182   cyanocobalamin  (VITAMIN B12) tablet 1,000 mcg  1,000 mcg Oral Daily Franky Redia SAILOR, MD   1,000 mcg at 09/06/24 9170   diphenhydrAMINE  (BENADRYL ) 12.5 MG/5ML elixir 12.5-25 mg  12.5-25 mg Oral Q4H PRN Danton Domino A, PA-C       feeding supplement (BOOST / RESOURCE BREEZE) liquid 1 Container  1 Container Oral TID BM Cindy Garnette POUR, MD   1 Container at 09/06/24 1320   methocarbamol  (ROBAXIN ) tablet 500  mg  500 mg Oral Q6H PRN Danton Domino LABOR, PA-C   500 mg at 09/06/24 9170   Or   methocarbamol  (ROBAXIN ) injection 500 mg  500 mg Intravenous Q6H PRN Danton Domino LABOR, PA-C       metoCLOPramide  (REGLAN ) tablet 5-10 mg  5-10 mg Oral Q8H PRN Danton Domino LABOR, PA-C       Or   metoCLOPramide  (REGLAN ) injection 5-10 mg  5-10 mg Intravenous Q8H PRN Danton Domino LABOR, PA-C       metoprolol  succinate (TOPROL -XL) 24 hr tablet 25 mg  25 mg Oral Daily Kakrakandy, Arshad N, MD   25 mg at 09/06/24 9170   morphine  (PF) 2 MG/ML injection 0.5 mg  0.5 mg Intravenous Q2H PRN Danton Domino LABOR, PA-C   0.5 mg at 09/05/24 2257   multivitamin with minerals tablet 1 tablet  1 tablet Oral Daily Cindy Garnette POUR, MD   1 tablet at 09/06/24 1320   omega-3 acid ethyl esters (LOVAZA ) capsule 1 g  1 g Oral Daily Kakrakandy, Arshad N, MD   1 g at 09/06/24 9170   ondansetron  (ZOFRAN ) tablet 4 mg  4 mg Oral Q6H PRN Danton Domino LABOR, PA-C       Or   ondansetron  (ZOFRAN ) injection 4 mg  4 mg Intravenous Q6H PRN Danton Domino LABOR, PA-C       oxyCODONE  (Oxy IR/ROXICODONE ) immediate release tablet 2.5 mg  2.5 mg Oral Q6H PRN Danton Domino LABOR, PA-C   2.5 mg at 09/06/24 9170   QUEtiapine  (SEROQUEL ) tablet 25 mg  25 mg Oral QHS Joseph, Preetha, MD   25 mg at 09/05/24 2147   senna (SENOKOT) tablet 8.6 mg  1 tablet Oral QHS Cindy Garnette POUR, MD       simvastatin  (ZOCOR ) tablet 40 mg  40 mg Oral q1800 Kakrakandy, Arshad N, MD   40 mg at 09/05/24 1709   tamsulosin  (FLOMAX ) capsule 0.4 mg  0.4 mg Oral QPC supper Franky Redia SAILOR, MD   0.4 mg at 09/05/24 1709   thiamine  (VITAMIN B1) tablet 100 mg  100 mg Oral Daily Cindy Garnette POUR, MD   100 mg at 09/06/24 1320   Vitamin D  (Ergocalciferol ) (DRISDOL ) 1.25 MG (50000 UNIT) capsule 50,000 Units  50,000 Units Oral Q7 days Cindy Garnette POUR, MD         Discharge Medications: Please see discharge summary for a list of discharge medications.  Relevant Imaging Results:  Relevant Lab  Results:   Additional Information SS#: [id number]  Bridget Cordella Simmonds, LCSW     "

## 2024-09-06 NOTE — Progress Notes (Signed)
" °  Progress Note   Patient: Larry Briggs FMW:[id number] DOB: Jun 21, 1936 DOA: 09/04/2024     2 DOS: the patient was seen and examined on 09/06/2024   Brief hospital course: 89 y.o. male with history of CAD, recent admission for stroke about a month ago, paroxysmal atrial fibrillation, COPD, anemia, hypertension was brought to the ER after patient had an unwitnessed fall when patient was trying to reach for remote control.  As per patient's wife who provided most of the history since his discharge last month for stroke when patient had right-sided hemiparesis patient has been largely bedbound.  He was on the bed and was trying to reach for a remote control when he lost balance and fell onto the floor.  Did not lose consciousness.  He hurt his left leg and was brought to the ER.   ED Course: In the ER x-rays revealed left tibia and fibula distal fracture.  Dr. Sherida on-call orthopedic surgeon was consulted.  Labs show hemoglobin of 8.8 creatinine 1.1 potassium 3.4 which replacement was given.  CT head and C-spine did not show anything acute.  EKG shows A-fib rate controlled  Assessment and Plan: Left-sided distal tibia and fibula fracture after mechanical fall. Orthopedic Surgery consulted. Pt is now s/p surgery 2/4 Therapy recs for SNF noted. Pt seen by CIR per family request, not candidate for CIR with rec for SNF Cont analgesia as needed Per Orthopedic Surgery, rec to hold anticoag another day post-op given post-op anemia this AM. Would resume Xarelto  whenever OK with Orthopedic Surgery CAD  denies any chest pain.   Troponins elevated but noted to remain flat.   Will continue with statins beta-blockers. Recent stroke with right-sided hemiparesis  Would restart anticoagulation whenever OK with Ortho On statins. COPD was recently treated for pneumonia last week as per the patient's wife.   Takes Trelegy and nebulizer as needed.   On chronic antibiotic therapy. Hypertension  Continue on  amlodipine  and metoprolol . BP very well controlled Anemia  Post op hgb 7.5 this AM from 8.3 Hemodynamically stable Cont to hold Xarelto  per Ortho recs for now Recheck CBC in AM, transfuse as needed BPH  Continued on tamsulosin . Dementia Continue seroquel  at bedtime Family reports known hx of sundowning and agitation      Subjective: Without complaints when seen.   Physical Exam: Vitals:   09/05/24 2311 09/06/24 0746 09/06/24 0817 09/06/24 1530  BP: (!) 104/52 119/65  (!) 109/50  Pulse: 79 86  80  Resp: 16 16  16   Temp: 98.6 F (37 C) 98.7 F (37.1 C)  97.8 F (36.6 C)  TempSrc:      SpO2: 97% 92% 93% 98%  Height:       General exam: Awake, laying in bed, in nad Respiratory system: Normal respiratory effort, no wheezing Cardiovascular system: regular rate, s1, s2 Gastrointestinal system: Soft, nondistended, positive BS Central nervous system: CN2-12 grossly intact, strength intact Extremities: Perfused, no clubbing Skin: Normal skin turgor, no notable skin lesions seen Psychiatry: Mood normal // no visual hallucinations   Data Reviewed:  Labs reviewed: Na 140, K 3.7, Cr 1.16, WBC 6.1, Hgb 7.5, Plts 265  Family Communication: Pt in room, family at bedside  Disposition: Status is: Inpatient Remains inpatient appropriate because: severity of illness  Planned Discharge Destination: Skilled nursing facility    Author: Garnette Pelt, MD 09/06/2024 4:48 PM  For on call review [email or web address].  "

## 2024-09-06 NOTE — Progress Notes (Signed)
 Ortho Trauma Note  Doing okay this AM, wife at bedside. Pain controlled.  Boot in place, dressing clean and dry. Palpable DP pulse, endorses sensation.   Imaging-stable post op imaging  Results for orders placed or performed during the hospital encounter of 09/04/24 (from the past 24 hours)  Prepare RBC (crossmatch)     Status: None   Collection Time: 09/05/24 11:49 AM  Result Value Ref Range   Order Confirmation      ORDER PROCESSED BY BLOOD BANK Performed at [facility], [address], [place], [place] [address]   Hemoglobin and hematocrit, blood     Status: Abnormal   Collection Time: 09/05/24  6:19 PM  Result Value Ref Range   Hemoglobin 8.3 (L) 13.0 - 17.0 g/dL   HCT 72.7 (L) 60.9 - 47.9 %  VITAMIN D  25 Hydroxy (Vit-D Deficiency, Fractures)     Status: Abnormal   Collection Time: 09/05/24  6:19 PM  Result Value Ref Range   Vit D, 25-Hydroxy 14.8 (L) 30 - 100 ng/mL  CBC     Status: Abnormal   Collection Time: 09/06/24  3:43 AM  Result Value Ref Range   WBC 6.1 4.0 - 10.5 K/uL   RBC 2.88 (L) 4.22 - 5.81 MIL/uL   Hemoglobin 7.5 (L) 13.0 - 17.0 g/dL   HCT 75.8 (L) 60.9 - 47.9 %   MCV 83.7 80.0 - 100.0 fL   MCH 26.0 26.0 - 34.0 pg   MCHC 31.1 30.0 - 36.0 g/dL   RDW 85.0 88.4 - 84.4 %   Platelets 265 150 - 400 K/uL   nRBC 0.0 0.0 - 0.2 %  Comprehensive metabolic panel     Status: Abnormal   Collection Time: 09/06/24  3:43 AM  Result Value Ref Range   Sodium 140 135 - 145 mmol/L   Potassium 3.7 3.5 - 5.1 mmol/L   Chloride 107 98 - 111 mmol/L   CO2 24 22 - 32 mmol/L   Glucose, Bld 157 (H) 70 - 99 mg/dL   BUN 28 (H) 8 - 23 mg/dL   Creatinine, Ser 8.83 0.61 - 1.24 mg/dL   Calcium  7.8 (L) 8.9 - 10.3 mg/dL   Total Protein 5.4 (L) 6.5 - 8.1 g/dL   Albumin  2.9 (L) 3.5 - 5.0 g/dL   AST 18 15 - 41 U/L   ALT 9 0 - 44 U/L   Alkaline Phosphatase 75 38 - 126 U/L   Total Bilirubin 0.6 0.0 - 1.2 mg/dL   GFR, Estimated >39 >39 mL/min   Anion gap 10 5 - 15    Larry 89 Briggs  w/ L distal tibia/fibula fracture s/p IMN  WBAT in boot PT/OT Okay for passive ROM of ankle and okay to remove boot while in bed Hgb 7.5 this AM, would hold off another day before starting DOAC, if hgb stable or higher can restart tomorrow. Dispo pending, wife let room at [facility] go because she couldn't financially hold it out of pocket. Dressings to be changed tomorrow.SABRA Franky MYRTIS Kendal, MD [facility] [phone] (office) [email or web address]

## 2024-09-06 NOTE — Progress Notes (Signed)
 Fracture Care Post-op Anticoagulation Consult Note  Brief Assessment: 78 yom s/p IMN left tibia fracture on 2/4, plan for non-op management left fib fracture. Pharmacy consulted to resume rivaroxaban  if POD 1 Hgb >=9.  Hgb is 7.5 and platelets are normal. No bleeding noted.  Plan: Hold resuming rivaroxaban  as Hgb parameter not met F/u Ortho recommendations  Thank you for involving pharmacy in this patient's care.  Delon Sax, PharmD, BCPS Clinical Pharmacist Clinical phone for 09/06/2024 is [phone] 09/06/2024 9:39 AM

## 2024-09-06 NOTE — Evaluation (Signed)
 Occupational Therapy Evaluation Patient Details Name: Larry Briggs MRN: [id number] DOB: Aug 25, 1935 Today's Date: 09/06/2024   History of Present Illness   Pt is a 89 y.o. M who presents 09/04/2024 after a fall with left distal tibial shaft and distal fibular shaft fracture now s/p intramedullary nailing of tibia. Nonoperative management of left fibula fracture. Significant PMH: CAD, stroke, paroxysmal atrial fibrillation, COPD, anemia, HTN.     Clinical Impressions Pt was working in therapy at [facility]. Wife reports he was standing at parallel bars with one person assist. He had not progressed to transfers or ambulation. R UE strength had progressed well with pt able to self feed with min assist and participate in grooming with electric razor. He was otherwise dependent in bathing, dressing and toileting. Pt presents with generalized weakness. He requires +2 assist for all mobility. Pt able to transfer to chair with use of stedy. Pt with increased WOB after transfer with SpO2 of 92% on RA and HR of 89, RN notified. Patient will benefit from continued inpatient follow up therapy, <3 hours/day. Will follow acutely.     If plan is discharge home, recommend the following:   Two people to help with walking and/or transfers;A lot of help with bathing/dressing/bathroom;Assistance with cooking/housework;Assistance with feeding;Direct supervision/assist for medications management;Direct supervision/assist for financial management;Assist for transportation;Help with stairs or ramp for entrance     Functional Status Assessment   Patient has had a recent decline in their functional status and demonstrates the ability to make significant improvements in function in a reasonable and predictable amount of time.     Equipment Recommendations   Other (comment) (defer)     Recommendations for Other Services         Precautions/Restrictions   Precautions Precautions:  Fall Precaution/Restrictions Comments: R hemi, incontinence (wears briefs) Required Braces or Orthoses: Other Brace Other Brace: L CAM boot Restrictions Weight Bearing Restrictions Per Provider Order: Yes LLE Weight Bearing Per Provider Order: Weight bearing as tolerated     Mobility Bed Mobility Overal bed mobility: Needs Assistance Bed Mobility: Supine to Sit     Supine to sit: Mod assist, +2 for physical assistance     General bed mobility comments: Assist for initiating movement, pt with good effort, use of bed pad to pivot hips to edge of bed and elevate trunk    Transfers Overall transfer level: Needs assistance Equipment used: Ambulation equipment used Transfers: Sit to/from Stand Sit to Stand: Mod assist, +2 physical assistance, Max assist, From elevated surface           General transfer comment: Pt with difficulty on 1st attempt, but from elevated bed height and use of bed pad, able to achieve full upright standing position x 2. Sustained static standing 30-60s to allow for removal of brief and placement of new one      Balance Overall balance assessment: Needs assistance Sitting-balance support: Feet supported Sitting balance-Leahy Scale: Fair       Standing balance-Leahy Scale: Poor                             ADL either performed or assessed with clinical judgement   ADL Overall ADL's : Needs assistance/impaired Eating/Feeding: Set up;Bed level   Grooming: Moderate assistance;Sitting                       Toileting- Clothing Manipulation and Hygiene: Total assistance;+2 for physical assistance;Sit to/from stand  Toileting - Clothing Manipulation Details (indicate cue type and reason): changed brief and performed pericare in standing with stedy             Vision Ability to See in Adequate Light: 0 Adequate Patient Visual Report: No change from baseline       Perception Perception: Not tested       Praxis Praxis: Not  tested       Pertinent Vitals/Pain Pain Assessment Pain Assessment: Faces Faces Pain Scale: Hurts little more Pain Location: LLE with weightbearing Pain Descriptors / Indicators: Operative site guarding, Grimacing Pain Intervention(s): Monitored during session, Repositioned, Premedicated before session     Extremity/Trunk Assessment Upper Extremity Assessment Upper Extremity Assessment: Right hand dominant;RUE deficits/detail RUE Deficits / Details: residual weakness from recent CVA, postures in flexion at rest, spontaneously and effectively able to use to pull up on cross bar of Stedy   Lower Extremity Assessment Lower Extremity Assessment: Defer to PT evaluation RLE Deficits / Details: Hx of residual weakness from prior stroke. Pt able to perform limited heel slide, ankle DF at least 3/5 LLE Deficits / Details: CAM boot donned. Grossly weak   Cervical / Trunk Assessment Cervical / Trunk Assessment: Kyphotic;Other exceptions (weaknss)   Communication Communication Communication: Impaired Factors Affecting Communication: Hearing impaired   Cognition Arousal: Alert Behavior During Therapy: Flat affect Cognition: Cognition impaired     Awareness: Intellectual awareness impaired, Online awareness impaired Memory impairment (select all impairments): Short-term memory   Executive functioning impairment (select all impairments): Initiation, Sequencing                   Following commands: Impaired Following commands impaired: Follows one step commands with increased time     Cueing  General Comments   Cueing Techniques: Verbal cues;Gestural cues;Tactile cues;Visual cues      Exercises     Shoulder Instructions      Home Living Family/patient expects to be discharged to:: Skilled nursing facility                                        Prior Functioning/Environment Prior Level of Function : Needs assist             Mobility Comments:  +1 squat vs stand pivot transfer using w/c, has taken 1-2 steps in parallel bars ADLs Comments: Has been able to feed self and shave with electric razor, otherwise dependent    OT Problem List: Decreased strength;Impaired balance (sitting and/or standing);Decreased activity tolerance;Decreased cognition;Pain;Impaired UE functional use;Cardiopulmonary status limiting activity   OT Treatment/Interventions: Self-care/ADL training;DME and/or AE instruction;Therapeutic activities;Patient/family education;Balance training;Neuromuscular education      OT Goals(Current goals can be found in the care plan section)   Acute Rehab OT Goals OT Goal Formulation: With family Time For Goal Achievement: 09/20/24 Potential to Achieve Goals: Fair ADL Goals Pt Will Perform Grooming: with min assist;sitting Pt Will Perform Upper Body Dressing: with mod assist;sitting Additional ADL Goal #1: Pt will complete bed mobility with min assist in preparation for ADLs. Additional ADL Goal #2: Pt will stand with +2 min assist and RW in preparation for toilet transfers.   OT Frequency:  Min 2X/week    Co-evaluation              AM-PAC OT 6 Clicks Daily Activity     Outcome Measure Help from another person eating meals?: A Little Help from  another person taking care of personal grooming?: A Lot Help from another person toileting, which includes using toliet, bedpan, or urinal?: Total Help from another person bathing (including washing, rinsing, drying)?: Total Help from another person to put on and taking off regular upper body clothing?: Total Help from another person to put on and taking off regular lower body clothing?: Total 6 Click Score: 9   End of Session Equipment Utilized During Treatment: Gait belt Nurse Communication: Mobility status;Other (comment) (increased WOB after activity)  Activity Tolerance: Patient tolerated treatment well Patient left: in chair;with call bell/phone within  reach;with chair alarm set;with family/visitor present  OT Visit Diagnosis: Unsteadiness on feet (R26.81);Pain;Muscle weakness (generalized) (M62.81);Hemiplegia and hemiparesis Hemiplegia - Right/Left: Right Hemiplegia - dominant/non-dominant: Dominant Hemiplegia - caused by: Cerebral infarction                Time: 0851-0920 OT Time Calculation (min): 29 min Charges:  OT General Charges $OT Visit: 1 Visit OT Evaluation $OT Eval Moderate Complexity: 1 Mod  Mliss HERO, OTR/L Acute Rehabilitation Services Office: [phone]   Kennth Mliss Helling 09/06/2024, 11:07 AM

## 2024-09-06 NOTE — TOC Initial Note (Signed)
 Transition of Care [facility]) - Initial/Assessment Note    Patient Details  Name: Larry Briggs MRN: [id number] Date of Birth: March 30, 1936  [facility]) CM/SW Contact:    Bridget Cordella Simmonds, LCSW Phone Number: 09/06/2024, 4:07 PM  Clinical Narrative:    Pt oriented x1, able to engage in brief conversation.  CSW spoke with wife Devere by phone and she provides all information.  Pt from home with wife but was at [facility] for STR when prior to this admission.  Discussed PT recommendation for more STR, wife is agreeable, did not do bed hold due to finances, would like to return to [facility].    Referral sent to [facility], CSW reached out to Brittany to review.                Expected Discharge Plan: Skilled Nursing Facility Barriers to Discharge: Continued Medical Work up   Patient Goals and CMS Choice     Choice offered to / list presented to : Spouse (wife Devere does request return to [facility])      Expected Discharge Plan and Services In-house Referral: Clinical Social Work   Post Acute Care Choice: Skilled Nursing Facility Living arrangements for the past 2 months: Single Family Home                                      Prior Living Arrangements/Services Living arrangements for the past 2 months: Single Family Home Lives with:: Spouse Patient language and need for interpreter reviewed:: Yes        Need for Family Participation in Patient Care: Yes (Comment) Care giver support system in place?: Yes (comment) Current home services: Other (comment) (none) Criminal Activity/Legal Involvement Pertinent to Current Situation/Hospitalization: No - Comment as needed  Activities of Daily Living   ADL Screening (condition at time of admission) Independently performs ADLs?: No Is the patient deaf or have difficulty hearing?: Yes Does the patient have difficulty seeing, even when wearing glasses/contacts?: No Does the patient have difficulty concentrating,  remembering, or making decisions?: Yes  Permission Sought/Granted                  Emotional Assessment Appearance:: Appears stated age Attitude/Demeanor/Rapport: Engaged Affect (typically observed): Pleasant Orientation: : Oriented to Self      Admission diagnosis:  Tibial fracture [S82.209A] Fall, initial encounter [W19.XXXA] Closed fracture of distal end of left tibia, unspecified fracture morphology, initial encounter [S82.302A] Closed fracture of distal end of left fibula, unspecified fracture morphology, initial encounter [D17.167J] Patient Active Problem List   Diagnosis Date Noted   Anemia 09/05/2024   PAF (paroxysmal atrial fibrillation) (HCC) 09/05/2024   Elevated troponin 09/05/2024   Tibial fracture 09/04/2024   Protein-calorie malnutrition, severe 07/21/2024   Right sided weakness 07/19/2024   CVA (cerebral vascular accident) (HCC) 12/05/2022   Pseudomonas aeruginosa infection 11/20/2019   Abrasion 07/30/2019   Medication management 07/03/2019   Abnormal findings on diagnostic imaging of lung 03/02/2019   New onset atrial fibrillation (HCC)    Hypokalemia    Acute blood loss anemia    Hip fracture (HCC) 09/04/2017   Cough 04/22/2016   COPD GOLD 4  04/22/2016   T wave inversion in EKG 05/10/2011   Hypertension    Arteriosclerotic cardiovascular disease (ASCVD)    Hypercholesteremia    Obesity    Ischemic heart disease    Increased glucose level  PCP:  [name], MD Pharmacy:   [facility] [address] [place] - [address] [place] [address] Phone: [phone] Fax: [phone]  [facility] - [place], [place] - [address] [place] [address] Phone: [phone] Fax: [phone]  [facility] - [place], [place] - [address] [place] [address] Phone: [phone] Fax:  [phone]     Social Drivers of Health (SDOH) Social History: SDOH Screenings   Food Insecurity: No Food Insecurity (07/22/2024)  Housing: Low Risk (07/22/2024)  Transportation Needs: No Transportation Needs (07/22/2024)  Utilities: Not At Risk (07/22/2024)  Social Connections: Unknown (07/22/2024)  Tobacco Use: Medium Risk (09/05/2024)   SDOH Interventions:     Readmission Risk Interventions     No data to display

## 2024-09-06 NOTE — TOC CAGE-AID Note (Signed)
 Transition of Care [facility]) - CAGE-AID Screening   Patient Details  Name: Larry Briggs MRN: [id number] Date of Birth: [birth year]  Transition [facility] Care [facility]) CM/SW Contact:    Charmain Diosdado E Jamela Cumbo, LCSW Phone Number: 09/06/2024, 9:33 AM   Clinical Narrative: Disoriented x 3.   CAGE-AID Screening: Substance Abuse Screening unable to be completed due to: : Patient unable to participate

## 2024-09-07 ENCOUNTER — Inpatient Hospital Stay (HOSPITAL_COMMUNITY)

## 2024-09-07 DIAGNOSIS — S82832A Other fracture of upper and lower end of left fibula, initial encounter for closed fracture: Secondary | ICD-10-CM

## 2024-09-07 LAB — CBC
HCT: 23.6 % — ABNORMAL LOW (ref 39.0–52.0)
Hemoglobin: 7.2 g/dL — ABNORMAL LOW (ref 13.0–17.0)
MCH: 26 pg (ref 26.0–34.0)
MCHC: 30.5 g/dL (ref 30.0–36.0)
MCV: 85.2 fL (ref 80.0–100.0)
Platelets: 265 10*3/uL (ref 150–400)
RBC: 2.77 MIL/uL — ABNORMAL LOW (ref 4.22–5.81)
RDW: 15.1 % (ref 11.5–15.5)
WBC: 8.7 10*3/uL (ref 4.0–10.5)
nRBC: 0 % (ref 0.0–0.2)

## 2024-09-07 LAB — COMPREHENSIVE METABOLIC PANEL WITH GFR
ALT: <5 U/L (ref 0–44)
AST: 18 U/L (ref 15–41)
Albumin: 2.8 g/dL — ABNORMAL LOW (ref 3.5–5.0)
Alkaline Phosphatase: 73 U/L (ref 38–126)
Anion gap: 9 (ref 5–15)
BUN: 29 mg/dL — ABNORMAL HIGH (ref 8–23)
CO2: 24 mmol/L (ref 22–32)
Calcium: 7.7 mg/dL — ABNORMAL LOW (ref 8.9–10.3)
Chloride: 106 mmol/L (ref 98–111)
Creatinine, Ser: 1.28 mg/dL — ABNORMAL HIGH (ref 0.61–1.24)
GFR, Estimated: 55 mL/min — ABNORMAL LOW
Glucose, Bld: 107 mg/dL — ABNORMAL HIGH (ref 70–99)
Potassium: 3.7 mmol/L (ref 3.5–5.1)
Sodium: 138 mmol/L (ref 135–145)
Total Bilirubin: 0.5 mg/dL (ref 0.0–1.2)
Total Protein: 5.4 g/dL — ABNORMAL LOW (ref 6.5–8.1)

## 2024-09-07 MED ORDER — SODIUM CHLORIDE 0.9 % IV SOLN: INTRAVENOUS | Status: AC

## 2024-09-07 MED ORDER — SODIUM CHLORIDE 0.9 % IV SOLN
3.0000 g | Freq: Three times a day (TID) | INTRAVENOUS | Status: AC
  Administered 2024-09-07: 3 g via INTRAVENOUS
  Filled 2024-09-07: qty 8

## 2024-09-07 NOTE — Plan of Care (Signed)
  Problem: Clinical Measurements: Goal: Will remain free from infection Outcome: Progressing   Problem: Elimination: Goal: Will not experience complications related to bowel motility Outcome: Progressing   Problem: Pain Managment: Goal: General experience of comfort will improve and/or be controlled Outcome: Progressing   Problem: Safety: Goal: Ability to remain free from injury will improve Outcome: Progressing

## 2024-09-07 NOTE — Progress Notes (Signed)
 " Progress Note   Patient: Larry Briggs FMW:[id number] DOB: June 01, 1936 DOA: 09/04/2024     3 DOS: the patient was seen and examined on 09/07/2024   Brief hospital course: 89 y.o. male with history of CAD, recent admission for stroke about a month ago, paroxysmal atrial fibrillation, COPD, anemia, hypertension was brought to the ER after patient had an unwitnessed fall when patient was trying to reach for remote control.  As per patient's wife who provided most of the history since his discharge last month for stroke when patient had right-sided hemiparesis patient has been largely bedbound.  He was on the bed and was trying to reach for a remote control when he lost balance and fell onto the floor.  Did not lose consciousness.  He hurt his left leg and was brought to the ER.   ED Course: In the ER x-rays revealed left tibia and fibula distal fracture.  Dr. Sherida on-call orthopedic surgeon was consulted.  Labs show hemoglobin of 8.8 creatinine 1.1 potassium 3.4 which replacement was given.  CT head and C-spine did not show anything acute.  EKG shows A-fib rate controlled  Assessment and Plan: Left-sided distal tibia and fibula fracture after mechanical fall. Orthopedic Surgery consulted. Pt is now s/p surgery 2/4 Therapy recs for SNF noted. Pt seen by CIR per family request, not candidate for CIR with rec for SNF Cont analgesia as needed Per Orthopedic Surgery, rec to cont to hold anticoag another day given hgb of 7.2 this AM. Would resume Xarelto  whenever OK with Orthopedic Surgery CAD  denies any chest pain.   Troponins elevated but noted to remain flat.   Will continue with statins beta-blockers. Recent stroke with right-sided hemiparesis  Would restart anticoagulation whenever OK with Ortho On statins. COPD was recently treated for pneumonia last week as per the patient's wife.   Takes Trelegy and nebulizer as needed.   On chronic antibiotic therapy. Hypertension  Continue on amlodipine   and metoprolol . BP very well controlled Anemia  Post op hgb 7.2 this AM from 8.3 Hemodynamically stable Cont to hold Xarelto  per Ortho recs for now Recheck CBC in AM, transfuse as needed BPH  Continued on tamsulosin . Dementia Continue seroquel  at bedtime Family reports known hx of sundowning and agitation Aspiration PNA Increased resp effort and coarse sounds when seen and examined, after pt was laying down to be changed by wife Pt had been on regular diet per family wishes Chart reviewd. On previous visit, pt was recommended to be on Dys 1 diet with thin liquids. Pt's wife claims diet was advanced to regular while at facility  CXR ordered and reviewed. Findings consistent with aspiration to the L lower airspace Keep NPO for now. Keep HOB >30degrees Started Unasyn  x 5 days Per SLP, family has voiced that pt would not want to be on dysphagia diet or NPO for prolonged time. Will consult Palliative Care for GOC Acute kidney injury Likely secondary to dehydration Start basal IVF Recheck bmet in AM      Subjective: Without complaints when seen.   Physical Exam: Vitals:   09/07/24 0805 09/07/24 0848 09/07/24 1258 09/07/24 1342  BP: (!) 104/58   103/64  Pulse: 84 82  93  Resp: 18     Temp: 97.8 F (36.6 C)   (!) 97.5 F (36.4 C)  TempSrc:      SpO2: 92% 95%  95%  Weight:   78.6 kg   Height:   5' 10 (1.778 m)  General exam: resp distress, awake Respiratory system: normal chest rise, coarse breath sounds, increased resp effort Cardiovascular system: regular rhythm, s1-s2 Gastrointestinal system: Nondistended, nontender, pos BS Central nervous system: No seizures, no tremors Extremities: No cyanosis, no joint deformities Skin: No rashes, no pallor Psychiatry: difficult to assess given mentation  Data Reviewed:  Labs reviewed: Na 138, K 3.7, Cr 1.28, WBC 8.7, Hgb 7.2, Pts 265  Family Communication: Pt in room, family at bedside  Disposition: Status is:  Inpatient Remains inpatient appropriate because: severity of illness  Planned Discharge Destination: Skilled nursing facility    Author: Garnette Pelt, MD 09/07/2024 6:50 PM  CRITICAL CARE Performed by: Garnette Pelt   Total critical care time: 40 minutes  Critical care time was exclusive of separately billable procedures and treating other patients.  Critical care was necessary to treat or prevent imminent or life-threatening deterioration.  Critical care was time spent personally by me on the following activities: development of treatment plan with patient and/or surrogate as well as nursing, discussions with consultants, evaluation of patient's response to treatment, examination of patient, obtaining history from patient or surrogate, ordering and performing treatments and interventions, ordering and review of laboratory studies, ordering and review of radiographic studies, pulse oximetry and re-evaluation of patient's condition.   For on call review [email or web address].  "

## 2024-09-07 NOTE — Progress Notes (Signed)
 Physical Therapy Treatment Patient Details Name: Larry Briggs MRN: [id number] DOB: Jul 20, 1936 Today's Date: 09/07/2024   History of Present Illness Pt is a 89 y.o. M who presents 09/04/2024 after a fall with left distal tibial shaft and distal fibular shaft fracture now s/p intramedullary nailing of tibia. Nonoperative management of left fibula fracture. Significant PMH: CAD, stroke, paroxysmal atrial fibrillation, COPD, anemia, HTN.    PT Comments  Pt received in supine, initially with bed in chair posture, spouse present in room and pt eating lunch. Discussion on plan for session and pt requesting premedication RN notified. PTA returned ~40 mins later and pt agreeable to therapy session however with noted increased work of breathing. RN/MD arriving to room during session to further assess pt and pt being placed on 2L O2 [place] for comfort, VSS in supine when checked. PTA defer EOB/OOB as per MD, pt will need CXR, so left in supine. Pt/spouse instructed on HOB >30 deg, benefits of increased supine BLE AROM/AAROM to reduce risk of blood clots and maintain strength (handout brought to room later in day after CXR performed) and benefits of mobility as appropriate. Unable to attempt EOB/OOB later in day when handout dropped off as pt was getting bed bath by NT. Patient will benefit from continued inpatient follow up therapy, <3 hours/day.    If plan is discharge home, recommend the following: Two people to help with walking and/or transfers;Two people to help with bathing/dressing/bathroom   Can travel by private vehicle     No  Equipment Recommendations  None recommended by PT    Recommendations for Other Services       Precautions / Restrictions Precautions Precautions: Fall Recall of Precautions/Restrictions: Impaired Precaution/Restrictions Comments: R hemi, incontinence (wears briefs) Required Braces or Orthoses: Other Brace Other Brace: L CAM boot Restrictions Weight Bearing  Restrictions Per Provider Order: Yes LLE Weight Bearing Per Provider Order: Weight bearing as tolerated     Mobility  Bed Mobility Overal bed mobility: Needs Assistance Bed Mobility: Rolling Rolling: Min assist, Used rails, +2 for safety/equipment         General bed mobility comments: Pt received in supine but with secretion-laden breathing and elevated respiratory rate so defer EOB/OOB at that time. Pt spouse and NT present in room when PTA arrived after 1800, assisting him to roll for hygiene and brief replacement. Defer EOB due to NT assisting pt with bath, so PTA providing education to his spouse on supine/seated BLE HEP.    Transfers Overall transfer level: Needs assistance                 General transfer comment: Pt chair set up with alarm and cushions for safety/comfort. Had planned on transfer OOB to chair, however at that time pt with increased work of breathing and MD/RN arriving to assess him. VS checked and WFL, but MD ordering CXR so not able to assess OOB. Returned later in day but pt getting bed bath so plan to work more on EOB/OOB mobility next session.    Ambulation/Gait                   Stairs             Wheelchair Mobility     Tilt Bed    Modified Rankin (Stroke Patients Only)       Balance Overall balance assessment: Needs assistance     Sitting balance - Comments: defer, breathing issues then pt getting bed bath when PTA returned  later                                    Communication Communication Communication: Impaired Factors Affecting Communication: Hearing impaired  Cognition Arousal: Alert Behavior During Therapy: Flat affect   PT - Cognitive impairments: History of cognitive impairments                       PT - Cognition Comments: Follows 1 step commands. Primary focus of session on educating spouse on BLE exercises per handout so she can have him work on these over weekend to maintain  strength/ROM in limbs as he may not get PT over weekend. Following commands: Impaired Following commands impaired: Follows one step commands with increased time    Cueing Cueing Techniques: Verbal cues, Gestural cues, Tactile cues, Visual cues  Exercises Other Exercises Other Exercises: spouse instructed on supine LE HEP handout (used knee surgery handout as exercises are appropriate, crossed out some exercises that were less relevant). Pt spouse instructed on RLE ankle pumps (he may need AA/PROM on his weaker R side), BLE heel slides, quad set, hip abduction, SAQ. Seated exercises also on handout but plan to review those in next sesion if pt able to transfer OOB to chair. Pt spouse receptive and took handout at end of session.    General Comments General comments (skin integrity, edema, etc.): Noted secretion laden breath sounds when PTA arrived and encouraged HOB >30 degrees, MD also placed order for [facility] elevated, RN notified he will need IS per MD order as well. Chair set up with alarm for safety and cushions for comfort, however due to pt increased shortness of breath, did not attempt OOB to chair transfers for pt safety due to MD ordering CXR. Reviewed CAM boot wear schedule and supine BLE exercises in first session but carryover limited due to pt symptoms and needing further assessment with increased respiratory rate/dypsnea.     Pertinent Vitals/Pain Pain Assessment Pain Assessment: Faces Faces Pain Scale: Hurts little more Pain Location: LLE with AROM after premedication Pain Descriptors / Indicators: Operative site guarding Pain Intervention(s): Limited activity within patient's tolerance, Monitored during session, Premedicated before session, Repositioned    Home Living                          Prior Function            PT Goals (current goals can now be found in the care plan section) Acute Rehab PT Goals Patient Stated Goal: pt spouse agreeable for him to return to  rehab PT Goal Formulation: With patient/family Time For Goal Achievement: 09/20/24 Progress towards PT goals: Progressing toward goals (session limited today but pt participatory as able)    Frequency    Min 2X/week      PT Plan      Co-evaluation              AM-PAC PT 6 Clicks Mobility   Outcome Measure  Help needed turning from your back to your side while in a flat bed without using bedrails?: A Lot Help needed moving from lying on your back to sitting on the side of a flat bed without using bedrails?: Total Help needed moving to and from a bed to a chair (including a wheelchair)?: Total Help needed standing up from a chair using your arms (e.g., wheelchair or  bedside chair)?: Total Help needed to walk in hospital room?: Total Help needed climbing 3-5 steps with a railing? : Total 6 Click Score: 7    End of Session Equipment Utilized During Treatment: Oxygen (O2 placed by spouse just prior to PTA arrival, RN checking vitals to assess sats, did not appear donned when PTA returned to his room later to drop off HEP handout) Activity Tolerance: Treatment limited secondary to medical complications (Comment);Other (comment) (shortness of breath then bed bath limiting pt abiltiy to participate) Patient left: in bed;with call bell/phone within reach;with bed alarm set;with nursing/sitter in room;with family/visitor present;Other (comment) (spouse in room, nursing staff present) Nurse Communication: Mobility status;Precautions;Other (comment) (increased WOB; pain meds (pt premedicated prior to attempt); pt needs IS) PT Visit Diagnosis: History of falling (Z91.81);Difficulty in walking, not elsewhere classified (R26.2);Pain Pain - Right/Left: Left Pain - part of body: Leg     Time: 1415 (PTA returned also later in day after 1800 to give HEP handout and stayed a few mins to reinforce instructions on this)-1423 PT Time Calculation (min) (ACUTE ONLY): 8 min  Charges:     $Therapeutic Activity: 8-22 mins PT General Charges $$ ACUTE PT VISIT: 1 Visit                     Rainbow Salman P., PTA Acute Rehabilitation Services Secure Chat Preferred 9a-5:30pm Office: [phone]    Connell HERO [facility] 09/07/2024, 7:07 PM

## 2024-09-07 NOTE — Telephone Encounter (Signed)
 Spoke with pt spouse and he has fallen out of bed and broken his tib/fib and required surgery she states he is currently at [facility] and they are handling pts respiratory needs  for now he will go to Rehab again after DC from hospital. She would like to hold off  on the neb and nebulizer until he gets back home. She greatly thanks you for addressing this for her. NFN

## 2024-09-07 NOTE — Plan of Care (Signed)
   Problem: Clinical Measurements: Goal: Ability to maintain clinical measurements within normal limits will improve Outcome: Progressing   Problem: Activity: Goal: Risk for activity intolerance will decrease Outcome: Progressing   Problem: Nutrition: Goal: Adequate nutrition will be maintained Outcome: Progressing

## 2024-09-07 NOTE — Progress Notes (Signed)
 Ortho Trauma Note  Doing okay this AM, wife at bedside. Got up yesterday but hasn't been up this AM.  Boot in place, removed to remove dressing, some serosang drainage, bruising about leg. Palpable DP pulse, endorses sensation.   Imaging-stable post op imaging  Results for orders placed or performed during the hospital encounter of 09/04/24 (from the past 24 hours)  CBC     Status: Abnormal   Collection Time: 09/07/24  6:18 AM  Result Value Ref Range   WBC 8.7 4.0 - 10.5 K/uL   RBC 2.77 (L) 4.22 - 5.81 MIL/uL   Hemoglobin 7.2 (L) 13.0 - 17.0 g/dL   HCT 76.3 (L) 60.9 - 47.9 %   MCV 85.2 80.0 - 100.0 fL   MCH 26.0 26.0 - 34.0 pg   MCHC 30.5 30.0 - 36.0 g/dL   RDW 84.8 88.4 - 84.4 %   Platelets 265 150 - 400 K/uL   nRBC 0.0 0.0 - 0.2 %  Comprehensive metabolic panel with GFR     Status: Abnormal   Collection Time: 09/07/24  6:18 AM  Result Value Ref Range   Sodium 138 135 - 145 mmol/L   Potassium 3.7 3.5 - 5.1 mmol/L   Chloride 106 98 - 111 mmol/L   CO2 24 22 - 32 mmol/L   Glucose, Bld 107 (H) 70 - 99 mg/dL   BUN 29 (H) 8 - 23 mg/dL   Creatinine, Ser 8.71 (H) 0.61 - 1.24 mg/dL   Calcium  7.7 (L) 8.9 - 10.3 mg/dL   Total Protein 5.4 (L) 6.5 - 8.1 g/dL   Albumin  2.8 (L) 3.5 - 5.0 g/dL   AST 18 15 - 41 U/L   ALT <5 0 - 44 U/L   Alkaline Phosphatase 73 38 - 126 U/L   Total Bilirubin 0.5 0.0 - 1.2 mg/dL   GFR, Estimated 55 (L) >60 mL/min   Anion gap 9 5 - 15    A/P 89 yo w/ L distal tibia/fibula fracture s/p IMN  WBAT in boot PT/OT Okay for passive ROM of ankle and okay to remove boot while in bed Hgb 7.2 this AM, would hold off another day before starting DOAC. Dispo pending, SNF Dressing change PRN if soiled, will re-evaluate on Monday.  Franky MYRTIS Light, MD [facility] [phone] (office) [email or web address]

## 2024-09-07 NOTE — Care Management Important Message (Signed)
 Important Message  Patient Details  Name: Larry Briggs MRN: [id number] Date of Birth: [birth year]   Important Message Given:  Yes - Medicare IM     Larry Briggs 09/07/2024, 12:57 PM

## 2024-09-07 NOTE — Evaluation (Signed)
 Clinical/Bedside Swallow Evaluation Patient Details  Name: Larry Briggs MRN: [id number] Date of Birth: [birth year]  Today's Date: 09/07/2024 Time: SLP Start Time (ACUTE ONLY): 1612 SLP Stop Time (ACUTE ONLY): 1630 SLP Time Calculation (min) (ACUTE ONLY): 18 min  Past Medical History:  Past Medical History:  Diagnosis Date   CAD (coronary artery disease)    Remote MI in 1980. CABG 1995. Stent to left main in 2004. Last cath in April of 2012. EF 50%; patent LIMA to DX/LAD with collateralization of the distal right, patent SVG to OM, occluded SVG to distal RCA, and patent stent to LAD   COPD (chronic obstructive pulmonary disease) (HCC)    Dyspnea    Dysrhythmia    History of heart attack 08/02/1978   Hypercholesteremia    Hypertension    Increased glucose level    Myocardial infarction [facility])    Obesity    Pneumonia    Stroke [facility])    Past Surgical History:  Past Surgical History:  Procedure Laterality Date   CARDIAC CATHETERIZATION  1993   CARDIAC CATHETERIZATION  April 2012   Mild reduction if EF at 50%. Patent LIMA to DX/LAD with collateralization to distal RCA, patent SVG to OM and occluded SVG to distal right and patent stent to LAD/Left main;   CORONARY ARTERY BYPASS GRAFT  05/1994   x5 LIMA to LAD & DX, SVG to LCX, SVG to RCA   CORONARY STENT PLACEMENT  2004   Stent to the L main   INTRAMEDULLARY (IM) NAIL INTERTROCHANTERIC Left 09/05/2017   Procedure: INTRAMEDULLARY (IM) NAIL INTERTROCHANTRIC;  Surgeon: Beverley Evalene BIRCH, MD;  Location: [facility] OR;  Service: Orthopedics;  Laterality: Left;   TIBIA IM NAIL INSERTION Left 09/05/2024   Procedure: INSERTION, INTRAMEDULLARY ROD, TIBIA;  Surgeon: Kendal Franky SQUIBB, MD;  Location: [facility] OR;  Service: Orthopedics;  Laterality: Left;   HPI:  89 yo male presenting to ED 2/3 after a fall with L distal tibial shaft and distal fibular shaft fx now s/p intramedullary nailing of the tibia. CXR shows increased hazy interstitial and mild airspace  opacity at the L lung base, possibly due to atelectasis or early bronchopneumonia, with aspiration pneumonitis not excluded. Seen by SLP during admission for L internal capsule CVA 07/2024. Most recent MBS 08/01/24 shows intermittent penetration of thin liquids (PAS 3) and prolonged mastication due to ill-fitting dentures with recommendations for Dys 1/thin liquids. Other notable PMH includes CAD, paroxysmal A-fib, COPD, anemia, HTN    Assessment / Plan / Recommendation  Clinical Impression  While pt does not exhibit overt clinical signs of aspiration, he exhibits significant risk factors for aspiration and inadequate nutrition/hydration (recent CVA with h/o dysphagia and cognitive deficits, acute confusion in the setting of a fall with acute fxs s/p surgery, recurrent pna and current respiratory status). It is reasonable to complete an MBS to help pt and his family better understand his CLOF, although Devere states that he is likely to refuse all diet modifications so a PMT consult may also be beneficial to clarify goals related to eating/drinking.   Pt was made NPO after concern for aspiration with new O2 needs after laying flat. He presents with increased WOB and decreased alertness. He took sips of thin liquids without signs clinically concerning for aspiration and oral transit with purees was functional. Primarily discussed pt's history with his wife, Devere, who reports he was advanced to a regular diet at [facility] and was also recently treated for pna there. Devere endorses decreased intake  with diet modifications and her own understanding of risk as well as pt's previously stated understanding and goals.   SLP Visit Diagnosis: Dysphagia, unspecified (R13.10)    Aspiration Risk  Moderate aspiration risk    Diet Recommendation           Other Recommendations Oral Care Recommendations: Oral care QID;Oral care prior to ice chip/H20     Swallow Evaluation Recommendations Recommendations: NPO except  meds;Ice chips PRN after oral care Medication Administration: Whole meds with puree Oral care recommendations: Oral care QID (4x/day);Oral care before ice chips/water  Recommended consults: Consider Palliative care   Assistance Recommended at Discharge    Functional Status Assessment Patient has had a recent decline in their functional status and demonstrates the ability to make significant improvements in function in a reasonable and predictable amount of time.  Frequency and Duration min 2x/week  2 weeks       Prognosis Prognosis for improved oropharyngeal function: Fair Barriers to Reach Goals: Cognitive deficits;Time post onset      Swallow Study   General HPI: 89 yo male presenting to ED 2/3 after a fall with L distal tibial shaft and distal fibular shaft fx now s/p intramedullary nailing of the tibia. CXR shows increased hazy interstitial and mild airspace opacity at the L lung base, possibly due to atelectasis or early bronchopneumonia, with aspiration pneumonitis not excluded. Seen by SLP during admission for L internal capsule CVA 07/2024. Most recent MBS 08/01/24 shows intermittent penetration of thin liquids (PAS 3) and prolonged mastication due to ill-fitting dentures with recommendations for Dys 1/thin liquids. Other notable PMH includes CAD, paroxysmal A-fib, COPD, anemia, HTN Type of Study: Bedside Swallow Evaluation Previous Swallow Assessment: see HPI Diet Prior to this Study: NPO Temperature Spikes Noted: No Respiratory Status: Nasal cannula History of Recent Intubation: No Behavior/Cognition: Alert;Requires cueing Oral Cavity Assessment: Within Functional Limits Oral Care Completed by SLP: No Oral Cavity - Dentition: Adequate natural dentition Vision: Functional for self-feeding Self-Feeding Abilities: Total assist Patient Positioning: Upright in bed Baseline Vocal Quality: Normal Volitional Cough: Cognitively unable to elicit Volitional Swallow: Unable to elicit     Oral/Motor/Sensory Function Overall Oral Motor/Sensory Function: Within functional limits   Ice Chips Ice chips: Not tested   Thin Liquid Thin Liquid: Within functional limits Presentation: Straw    Nectar Thick Nectar Thick Liquid: Not tested   Honey Thick Honey Thick Liquid: Not tested   Puree Puree: Within functional limits Presentation: Spoon   Solid     Solid: Not tested      Damien Blumenthal, M.A., CCC-SLP Speech Language Pathology, Acute Rehabilitation Services  Secure Chat preferred [phone]  09/07/2024,5:14 PM

## 2024-09-07 NOTE — Progress Notes (Addendum)
 PT Cancellation Note  Patient Details Name: Larry Briggs MRN: [id number] DOB: 11/08/1935   Cancelled Treatment:    Reason Eval/Treat Not Completed: (P) Other (comment) (attempt at 1301, pt just started eating lunch and had pain meds ~30 mins prior, requesting some time to eat.)  Plan to return a little later in day once pt is medically appropriate per PT plan of care.   Gadiel John M Glori Machnik 09/07/2024, 2:22 PM

## 2024-09-07 NOTE — TOC Progression Note (Addendum)
 Transition of Care [facility]) - Progression Note    Patient Details  Name: Larry Briggs MRN: [id number] Date of Birth: 08-Jun-1936  Transition of Care [facility]) CM/SW Contact  Bridget Cordella Simmonds, LCSW Phone Number: 09/07/2024, 1:46 PM  Clinical Narrative:   CSW confirmed with [facility]: they can take pt back.  No private room.    No DC today per MD.  1500: TC pt wife: they only want a private room, will not return to [facility] in semi private.  Permission given to send out referral to other SNF to search for private room options.  Medicare choice document provided to pt wife.   Expected Discharge Plan: Skilled Nursing Facility Barriers to Discharge: Continued Medical Work up               Expected Discharge Plan and Services In-house Referral: Clinical Social Work   Post Acute Care Choice: Skilled Nursing Facility Living arrangements for the past 2 months: Single Family Home                                       Social Drivers of Health (SDOH) Interventions SDOH Screenings   Food Insecurity: No Food Insecurity (07/22/2024)  Housing: Low Risk (07/22/2024)  Transportation Needs: No Transportation Needs (07/22/2024)  Utilities: Not At Risk (07/22/2024)  Social Connections: Unknown (07/22/2024)  Tobacco Use: Medium Risk (09/05/2024)    Readmission Risk Interventions     No data to display
